# Patient Record
Sex: Female | Born: 1940
Health system: Southern US, Community
[De-identification: ages and names within clinical notes are randomized; demographics above are authoritative.]

## PROBLEM LIST (undated history)

## (undated) DIAGNOSIS — M1990 Unspecified osteoarthritis, unspecified site: Secondary | ICD-10-CM

## (undated) DIAGNOSIS — B019 Varicella without complication: Secondary | ICD-10-CM

## (undated) DIAGNOSIS — I10 Essential (primary) hypertension: Secondary | ICD-10-CM

## (undated) DIAGNOSIS — K5900 Constipation, unspecified: Secondary | ICD-10-CM

## (undated) DIAGNOSIS — J439 Emphysema, unspecified: Secondary | ICD-10-CM

## (undated) DIAGNOSIS — E538 Deficiency of other specified B group vitamins: Secondary | ICD-10-CM

## (undated) DIAGNOSIS — R7303 Prediabetes: Secondary | ICD-10-CM

## (undated) DIAGNOSIS — G4730 Sleep apnea, unspecified: Secondary | ICD-10-CM

## (undated) DIAGNOSIS — N390 Urinary tract infection, site not specified: Secondary | ICD-10-CM

## (undated) DIAGNOSIS — R600 Localized edema: Secondary | ICD-10-CM

## (undated) DIAGNOSIS — R42 Dizziness and giddiness: Secondary | ICD-10-CM

## (undated) DIAGNOSIS — I890 Lymphedema, not elsewhere classified: Secondary | ICD-10-CM

## (undated) DIAGNOSIS — C801 Malignant (primary) neoplasm, unspecified: Secondary | ICD-10-CM

## (undated) DIAGNOSIS — R079 Chest pain, unspecified: Secondary | ICD-10-CM

## (undated) DIAGNOSIS — M2550 Pain in unspecified joint: Secondary | ICD-10-CM

## (undated) DIAGNOSIS — N189 Chronic kidney disease, unspecified: Secondary | ICD-10-CM

## (undated) DIAGNOSIS — M549 Dorsalgia, unspecified: Secondary | ICD-10-CM

## (undated) DIAGNOSIS — C55 Malignant neoplasm of uterus, part unspecified: Secondary | ICD-10-CM

## (undated) DIAGNOSIS — C50919 Malignant neoplasm of unspecified site of unspecified female breast: Secondary | ICD-10-CM

## (undated) DIAGNOSIS — H409 Unspecified glaucoma: Secondary | ICD-10-CM

## (undated) DIAGNOSIS — R0602 Shortness of breath: Secondary | ICD-10-CM

## (undated) DIAGNOSIS — F419 Anxiety disorder, unspecified: Secondary | ICD-10-CM

## (undated) DIAGNOSIS — D649 Anemia, unspecified: Secondary | ICD-10-CM

## (undated) DIAGNOSIS — R32 Unspecified urinary incontinence: Secondary | ICD-10-CM

## (undated) HISTORY — DX: Anemia, unspecified: D64.9

## (undated) HISTORY — DX: Anxiety disorder, unspecified: F41.9

## (undated) HISTORY — DX: Unspecified osteoarthritis, unspecified site: M19.90

## (undated) HISTORY — DX: Unspecified urinary incontinence: R32

## (undated) HISTORY — DX: Malignant (primary) neoplasm, unspecified: C80.1

## (undated) HISTORY — DX: Urinary tract infection, site not specified: N39.0

## (undated) HISTORY — DX: Constipation, unspecified: K59.00

## (undated) HISTORY — DX: Pain in unspecified joint: M25.50

## (undated) HISTORY — DX: Deficiency of other specified B group vitamins: E53.8

## (undated) HISTORY — DX: Localized edema: R60.0

## (undated) HISTORY — DX: Varicella without complication: B01.9

## (undated) HISTORY — DX: Lymphedema, not elsewhere classified: I89.0

## (undated) HISTORY — DX: Prediabetes: R73.03

## (undated) HISTORY — DX: Unspecified glaucoma: H40.9

## (undated) HISTORY — DX: Essential (primary) hypertension: I10

## (undated) HISTORY — DX: Dorsalgia, unspecified: M54.9

## (undated) HISTORY — DX: Malignant neoplasm of uterus, part unspecified: C55

## (undated) HISTORY — DX: Shortness of breath: R06.02

## (undated) HISTORY — DX: Emphysema, unspecified: J43.9

## (undated) HISTORY — DX: Chronic kidney disease, unspecified: N18.9

## (undated) HISTORY — DX: Chest pain, unspecified: R07.9

## (undated) HISTORY — DX: Dizziness and giddiness: R42

## (undated) HISTORY — DX: Malignant neoplasm of unspecified site of unspecified female breast: C50.919

---

## 2009-08-12 HISTORY — PX: ABDOMINAL HYSTERECTOMY: SHX81

## 2010-08-12 HISTORY — PX: REPLACEMENT TOTAL KNEE: SUR1224

## 2017-12-04 LAB — CBC AND DIFFERENTIAL
HCT: 39 — AB (ref 41–53)
Hemoglobin: 12.3 — AB (ref 13.5–17.5)
Neutrophils Absolute: 6
Platelets: 333 (ref 150–399)
WBC: 8.6

## 2017-12-04 LAB — BASIC METABOLIC PANEL
BUN: 19 (ref 4–21)
CREATININE: 0.6 (ref 0.6–1.3)
Glucose: 94
Potassium: 5.2 (ref 3.4–5.3)
Sodium: 140 (ref 137–147)

## 2017-12-04 LAB — HEPATIC FUNCTION PANEL
ALT: 32 (ref 10–40)
AST: 31 (ref 14–40)
Alkaline Phosphatase: 95 (ref 25–125)
Bilirubin, Total: 0.4

## 2017-12-09 LAB — HM MAMMOGRAPHY

## 2018-08-14 ENCOUNTER — Ambulatory Visit (INDEPENDENT_AMBULATORY_CARE_PROVIDER_SITE_OTHER): Payer: Medicare HMO | Admitting: Nurse Practitioner

## 2018-08-14 ENCOUNTER — Encounter: Payer: Self-pay | Admitting: Nurse Practitioner

## 2018-08-14 VITALS — BP 138/90 | HR 85 | Temp 98.3°F | Ht 63.5 in | Wt 264.8 lb

## 2018-08-14 DIAGNOSIS — R238 Other skin changes: Secondary | ICD-10-CM | POA: Diagnosis not present

## 2018-08-14 DIAGNOSIS — I10 Essential (primary) hypertension: Secondary | ICD-10-CM | POA: Diagnosis not present

## 2018-08-14 MED ORDER — HYDROCHLOROTHIAZIDE 25 MG PO TABS: 25.0000 mg | ORAL_TABLET | Freq: Every day | ORAL | 0 refills | Status: DC

## 2018-08-14 MED ORDER — KETOCONAZOLE 2 % EX SHAM: 1.0000 "application " | MEDICATED_SHAMPOO | CUTANEOUS | 1 refills | Status: DC

## 2018-08-14 NOTE — Patient Instructions (Addendum)
List of podiatry: [facility] in [facility] or [facility] in [place]  List of ophthalmology: [facility] in [facility] or [facility]  List of orthopedic: [facility] in [place].  Sign medical release to get records from previous pcp. Will provide additional HCTZ refill after review of lab results from previous pcp  Let me know name of nebulizer medication.

## 2018-08-14 NOTE — Progress Notes (Signed)
Subjective:  Patient ID: Kristi Hunter, female    DOB: Sep 05, 1940  Age: 78 y.o. MRN: [id number]  CC: Establish Care (Est care/ cough clear, going on for little over a month, lost voice / using nebulizer/medication consult: for hair/ FYI pt will report immunizations later)   HPI Retired Engineer, maintenance (IT), has 3children and 2grandchildren  moved from [facility] to [name]. Previous pcp in [facility], last seen Dr.Moraes' office 06/2018. Last labs done 06/2018. Hx of HTN: use of HCTZ, needs refill Hx of Dry Scalp: needs ketoconazole shampoo refill Hx of OA in multiple joints, intermittent sciatica pain with right side radiculopathy.  Hx of chronic bronchitis (use of nebulizer prn, unable to remember name) Hx of nephritis as child, no hx of CKD. Hx of chronic LE edema: use of electric compression device as needed.  Recurrent cough, diagnosed with chronic bronchitis, previous tobacco use from age 16 to 38.  Hx of Urinary incontinence (stress and Urge): Worse at night.  Hx of Uterine cancer 2011. No radiation of chemotherapy needed per patient. Followed by oncology x 15yrs only, s/p total hysterectomy and oophrectomy.  Reviewed past Medical, Social and Family history today.  Outpatient Medications Prior to Visit  Medication Sig Dispense Refill  . aspirin 81 MG chewable tablet Chew by mouth daily.    . Calcium Carb-Cholecalciferol (CALCIUM 600 + D PO) Take by mouth.    . Cholecalciferol (VITAMIN D3 PO) Take 50 mcg by mouth.    Marland Kitchen GLUCOSAMINE-CHONDROITIN-VIT D3 PO Take 2,000 Units by mouth.    . Melatonin 10 MG TABS Take by mouth.    . Multiple Vitamin (MULTIVITAMIN) tablet Take 1 tablet by mouth daily.    . naproxen sodium (ALEVE) 220 MG tablet Take 220 mg by mouth.     No facility-administered medications prior to visit.     ROS See HPI  Objective:  BP 138/90   Pulse 85   Temp 98.3 F (36.8 C) (Oral)   Ht 5' 3.5" (1.613 m)   Wt 264 lb 12.8 oz (120.1 kg)   SpO2 95%   BMI 46.17 kg/m   BP Readings from  Last 3 Encounters:  08/14/18 138/90    Wt Readings from Last 3 Encounters:  08/14/18 264 lb 12.8 oz (120.1 kg)    Physical Exam Vitals signs reviewed.  Constitutional:      Appearance: He is obese.  Cardiovascular:     Rate and Rhythm: Normal rate and regular rhythm.     Pulses: Normal pulses.     Heart sounds: Normal heart sounds.  Pulmonary:     Effort: Pulmonary effort is normal.     Breath sounds: Normal breath sounds.  Musculoskeletal:     Right lower leg: No edema.     Left lower leg: No edema.  Skin:    Comments: Dry scaly patches on occipital region of scalp  Neurological:     Mental Status: He is alert.    No results found for: WBC, HGB, HCT, PLT, GLUCOSE, CHOL, TRIG, HDL, LDLDIRECT, LDLCALC, ALT, AST, NA, K, CL, CREATININE, BUN, CO2, TSH, PSA, INR, GLUF, HGBA1C, MICROALBUR  Assessment & Plan:   Sidonie was seen today for establish care.  Diagnoses and all orders for this visit:  Dry scalp -     ketoconazole (NIZORAL) 2 % shampoo; Apply 1 application topically 2 (two) times a week.  Essential hypertension -     hydrochlorothiazide (HYDRODIURIL) 25 MG tablet; Take 1 tablet (25 mg total) by mouth daily.   I  am having Eulis Manly start on ketoconazole and hydrochlorothiazide. I am also having him maintain his Cholecalciferol (VITAMIN D3 PO), GLUCOSAMINE-CHONDROITIN-VIT D3 PO, Calcium Carb-Cholecalciferol (CALCIUM 600 + D PO), aspirin, Melatonin, naproxen sodium, and multivitamin.  Meds ordered this encounter  Medications  . ketoconazole (NIZORAL) 2 % shampoo    Sig: Apply 1 application topically 2 (two) times a week.    Dispense:  120 mL    Refill:  1    Order Specific Question:   Supervising Provider    Answer:   Lucille Passy [[id number]]  . hydrochlorothiazide (HYDRODIURIL) 25 MG tablet    Sig: Take 1 tablet (25 mg total) by mouth daily.    Dispense:  30 tablet    Refill:  0    Order Specific Question:   Supervising Provider    Answer:   Lucille Passy [[id number]]    Problem List Items Addressed This Visit    None    Visit Diagnoses    Dry scalp    -  Primary   Relevant Medications   ketoconazole (NIZORAL) 2 % shampoo (Start on 08/17/2018)   Essential hypertension       Relevant Medications   hydrochlorothiazide (HYDRODIURIL) 25 MG tablet   aspirin 81 MG chewable tablet       Follow-up: Return if symptoms worsen or fail to improve.  Wilfred Lacy, NP

## 2018-08-21 ENCOUNTER — Telehealth: Payer: Self-pay

## 2018-08-21 NOTE — Telephone Encounter (Signed)
Spoke with the pt, advise her that we still waiting on records from her last PCP in order to complete this from for long term. Pt request to wait until her records comes to prevent her from going/paying multiple times to [facility].   We will call the pt once we get the records/form complete (I will check on this next week if we don't have record.   FYI--pt stated this handicap placard is mostly for her arthritis,unable to walk more than 200 feet without having SOB, hip and knee painful during rainy and winter season (gets really bad during this time a year). Pt doesn't like to take pain med while driving that's why handicap placard very helpful to the pt.

## 2018-08-21 NOTE — Telephone Encounter (Signed)
Copied from [place] [id number]. Topic: General - Inquiry >> Aug 21, 2018 10:59 AM Sheran Luz wrote: Reason for CRM: Patient called inquiring as to why her parking placard that she picked up from office yesterday was only for 6 months. Patient states that in [place] she received a placard that was good for 5 years. Patient would like a call back as soon as possible to discuss. Please advise.

## 2018-08-25 NOTE — Telephone Encounter (Signed)
Do not have record yet, refaxed record releaser form again. Waiting for records.

## 2018-08-27 ENCOUNTER — Encounter: Payer: Self-pay | Admitting: Nurse Practitioner

## 2018-08-27 DIAGNOSIS — I10 Essential (primary) hypertension: Secondary | ICD-10-CM | POA: Insufficient documentation

## 2018-08-27 DIAGNOSIS — M5431 Sciatica, right side: Secondary | ICD-10-CM | POA: Insufficient documentation

## 2018-08-27 DIAGNOSIS — R739 Hyperglycemia, unspecified: Secondary | ICD-10-CM | POA: Insufficient documentation

## 2018-08-27 DIAGNOSIS — E782 Mixed hyperlipidemia: Secondary | ICD-10-CM | POA: Insufficient documentation

## 2018-08-27 DIAGNOSIS — M1990 Unspecified osteoarthritis, unspecified site: Secondary | ICD-10-CM

## 2018-08-27 DIAGNOSIS — M1991 Primary osteoarthritis, unspecified site: Secondary | ICD-10-CM | POA: Insufficient documentation

## 2018-08-27 DIAGNOSIS — E559 Vitamin D deficiency, unspecified: Secondary | ICD-10-CM | POA: Insufficient documentation

## 2018-08-27 DIAGNOSIS — F419 Anxiety disorder, unspecified: Secondary | ICD-10-CM | POA: Insufficient documentation

## 2018-08-27 DIAGNOSIS — R609 Edema, unspecified: Secondary | ICD-10-CM | POA: Insufficient documentation

## 2018-08-27 DIAGNOSIS — F325 Major depressive disorder, single episode, in full remission: Secondary | ICD-10-CM | POA: Insufficient documentation

## 2018-08-27 LAB — BRAIN NATRIURETIC PEPTIDE: B-Type Natriuretic Peptide: 7.3

## 2018-08-27 NOTE — Progress Notes (Signed)
Abstracted result and sent to scan  

## 2018-08-27 NOTE — Telephone Encounter (Signed)
Form completed, send to scan, pt aware to pick up form at the office.

## 2018-08-27 NOTE — Telephone Encounter (Signed)
Received record from Dr. Lindalou Hose. Placed at [employer] to review.

## 2018-08-30 DIAGNOSIS — R109 Unspecified abdominal pain: Secondary | ICD-10-CM | POA: Diagnosis not present

## 2018-08-30 DIAGNOSIS — A09 Infectious gastroenteritis and colitis, unspecified: Secondary | ICD-10-CM | POA: Diagnosis not present

## 2018-08-31 ENCOUNTER — Telehealth: Payer: Self-pay | Admitting: Nurse Practitioner

## 2018-08-31 NOTE — Telephone Encounter (Signed)
Following up on the TCM call over the weekend. Pt call in complaining of diarrhea,abd cramping x 5 days, runny brown stools. Pt has been eating yogurt and BRAT diet,denie fever or taking any abx right now.   Spoke with the pt, she went to urgent care 08/30/2018 and got abx,abd cramping med and probiotic to treat her symptoms, she stated she feeling a little better today. Pt will finish abx and see how she feels.   Advise the pt to let us know or go to urgent care/ ED if she feels worse or she is not better. Pt verbalized understand.

## 2018-09-18 ENCOUNTER — Telehealth: Payer: Self-pay

## 2018-09-18 DIAGNOSIS — R69 Illness, unspecified: Secondary | ICD-10-CM | POA: Diagnosis not present

## 2018-09-18 NOTE — Telephone Encounter (Signed)
Copied from [place] [id number]. 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 Please advise. Patient is scheduled to have dental surgery on Monday 09/21/2018 and they need this form asap.

## 2018-09-21 DIAGNOSIS — R69 Illness, unspecified: Secondary | ICD-10-CM | POA: Diagnosis not present

## 2018-09-21 NOTE — Telephone Encounter (Signed)
Form completed and faxed. 

## 2018-10-05 ENCOUNTER — Telehealth: Payer: Self-pay | Admitting: Nurse Practitioner

## 2018-10-05 NOTE — Telephone Encounter (Signed)
Copied from [place] [phone]. Topic: Quick Communication - See Telephone Encounter >> Oct 05, 2018  9:28 AM Robina Ade, Helene Kelp D wrote: CRM for notification. See Telephone encounter for: 10/05/18. Patient call and said that she would like to talk to someone regarding 2 bills that she got of $25. She said that she saw  Wilfred Lacy and her insurance has her at a specialty and not a primary physician. Patient has already spokes with her insurance and [facility] billing. Please call patient back, thanks.

## 2018-10-14 NOTE — Telephone Encounter (Signed)
Megan from [facility] called and stated Pt is receiving bills due to NP [name] being the name on the submission/ [employer] asked if visits can be resubmitted and for now on with an MD's name or the office name since under their insurance an NP is a specialist / please reach out to Pt and advise

## 2018-10-15 NOTE — Telephone Encounter (Signed)
[name] please help.

## 2018-10-20 NOTE — Telephone Encounter (Signed)
I have reached out to someone in credentialing and checking on this. I also spoke with [name] and she never had to submit claims that way. I will update patient once I find out more.

## 2018-10-27 NOTE — Telephone Encounter (Signed)
[employer] calling back to see if the patient has been contacted? Wants to know if the billing will be changed as billing told them to contact the practice.

## 2018-11-12 ENCOUNTER — Telehealth: Payer: Self-pay

## 2018-11-12 DIAGNOSIS — Z1211 Encounter for screening for malignant neoplasm of colon: Secondary | ICD-10-CM

## 2018-11-12 NOTE — Telephone Encounter (Signed)
Charlotte please advise, okey to refer pt to GI for colonoscopy?

## 2018-11-12 NOTE — Telephone Encounter (Signed)
If no FHx of colon cancer of colon polyp, ok to order cologuard.

## 2018-11-12 NOTE — Telephone Encounter (Signed)
Left vm for the pt to call back.  

## 2018-11-12 NOTE — Telephone Encounter (Signed)
ok 

## 2018-11-12 NOTE — Telephone Encounter (Signed)
Copied from [place] [phone]. Topic: General - Other >> Nov 11, 2018  3:50 PM Percell Belt A wrote: Reason for CRM:  pt called in and left message on general mailbox-  she is stating that per her ins, they are telling her that Kristi Hunter is a specialist, she stated she has been dealing with this with her ins since Jan.  She would like to have a call back.  She is getting a bill for 25.00 because it is saying that Either Kristi Hunter, or Dr Kristi Hunter is billing a specialist.   She would also like to know if she can do the Cologuard ?  Instead of a Colonoscopy ?     Please advise  -[phone]

## 2018-11-12 NOTE — Telephone Encounter (Signed)
Kristi Hunter please advise about the cologuard vs colonoscopy?   [name] please help with the billing part.

## 2018-11-12 NOTE — Telephone Encounter (Signed)
Pt called back . FAhx fo colon CA X2 grandfather, grandmother polyps. She didn't remember with side had which one, Please RCB to Pt .

## 2018-11-13 NOTE — Addendum Note (Signed)
Addended byShawnie Pons on: 11/13/2018 09:23 AM   Modules accepted: Orders

## 2018-11-13 NOTE — Telephone Encounter (Signed)
Referral enter/ pt is aware.

## 2018-12-18 DIAGNOSIS — M21622 Bunionette of left foot: Secondary | ICD-10-CM | POA: Diagnosis not present

## 2018-12-18 DIAGNOSIS — M79672 Pain in left foot: Secondary | ICD-10-CM | POA: Diagnosis not present

## 2018-12-18 DIAGNOSIS — L600 Ingrowing nail: Secondary | ICD-10-CM | POA: Diagnosis not present

## 2019-01-07 ENCOUNTER — Other Ambulatory Visit: Payer: Self-pay | Admitting: Nurse Practitioner

## 2019-01-07 DIAGNOSIS — I10 Essential (primary) hypertension: Secondary | ICD-10-CM

## 2019-01-07 NOTE — Telephone Encounter (Signed)
Needs video appt for re eval of HTN prior to medication refill

## 2019-01-07 NOTE — Telephone Encounter (Signed)
Monica please help call the pt and offer an appt 

## 2019-01-07 NOTE — Telephone Encounter (Signed)
I called and spoke to patient. Patient informed that prior to med refill per Wilfred Lacy, patient needs a virtual visit. Patient stated that she was busy right now and will call back later.

## 2019-01-11 ENCOUNTER — Telehealth: Payer: Self-pay

## 2019-01-11 ENCOUNTER — Encounter: Payer: Self-pay | Admitting: Nurse Practitioner

## 2019-01-11 ENCOUNTER — Ambulatory Visit (INDEPENDENT_AMBULATORY_CARE_PROVIDER_SITE_OTHER): Payer: Medicare HMO | Admitting: Nurse Practitioner

## 2019-01-11 VITALS — BP 140/82 | HR 115 | Temp 98.1°F | Ht 63.5 in | Wt 168.6 lb

## 2019-01-11 DIAGNOSIS — L719 Rosacea, unspecified: Secondary | ICD-10-CM | POA: Diagnosis not present

## 2019-01-11 DIAGNOSIS — I10 Essential (primary) hypertension: Secondary | ICD-10-CM

## 2019-01-11 DIAGNOSIS — Z800 Family history of malignant neoplasm of digestive organs: Secondary | ICD-10-CM

## 2019-01-11 DIAGNOSIS — E782 Mixed hyperlipidemia: Secondary | ICD-10-CM

## 2019-01-11 DIAGNOSIS — L821 Other seborrheic keratosis: Secondary | ICD-10-CM

## 2019-01-11 DIAGNOSIS — F325 Major depressive disorder, single episode, in full remission: Secondary | ICD-10-CM | POA: Diagnosis not present

## 2019-01-11 DIAGNOSIS — R69 Illness, unspecified: Secondary | ICD-10-CM | POA: Diagnosis not present

## 2019-01-11 DIAGNOSIS — R739 Hyperglycemia, unspecified: Secondary | ICD-10-CM

## 2019-01-11 DIAGNOSIS — Z780 Asymptomatic menopausal state: Secondary | ICD-10-CM

## 2019-01-11 DIAGNOSIS — F419 Anxiety disorder, unspecified: Secondary | ICD-10-CM | POA: Diagnosis not present

## 2019-01-11 DIAGNOSIS — Z1211 Encounter for screening for malignant neoplasm of colon: Secondary | ICD-10-CM | POA: Diagnosis not present

## 2019-01-11 MED ORDER — METRONIDAZOLE 0.75 % EX GEL: 1.0000 "application " | Freq: Two times a day (BID) | CUTANEOUS | 1 refills | Status: DC

## 2019-01-11 MED ORDER — KETOCONAZOLE 2 % EX SHAM: 1.0000 "application " | MEDICATED_SHAMPOO | CUTANEOUS | 2 refills | Status: DC

## 2019-01-11 NOTE — Telephone Encounter (Signed)
I LMOVM for pt to call back to go through check-in process prior to visit with Charlotte/thx dmf

## 2019-01-11 NOTE — Patient Instructions (Signed)
Go to lab for blood draw. Will send HCTZ refill after review of lab results. Let me know if you change your mind about urology referral. You will be contacted to schedule appt for bone density and with GI.

## 2019-01-11 NOTE — Progress Notes (Addendum)
Virtual Visit via Video Note  I connected with Kristi Hunter on 01/13/19 at 11:00 AM EDT by a video enabled telemedicine application and verified that I am speaking with the correct person using two identifiers.  Location: Patient:Home Provider: Office   I discussed the limitations of evaluation and management by telemedicine and the availability of in person appointments. The patient expressed understanding and agreed to proceed.  History of Present Illness: HTN: No vital signs to share. Use of HCTZ at this time.  Anxiety and depression: chronic Denies any depression. Endorses intermittent anxiety: symptoms are palpitation. Symptoms improve with breathing exercise. States she is not interested in use of medication or counseling at this time.  Nocturia: Chronic, disrupts sleep, use of ditropan in past with no improvement. Evaluated by urology while in [place]. She is not interested in another evaluation by urology nor use of medication at this time.   Rash: Chronic, waxing and waning Dry scalp which improves with ketoconazole shampoo Rash around nose and under chin.  Observations/Objective: Physical Exam  Constitutional: She is oriented to person, place, and time. No distress.  Pulmonary/Chest: Effort normal.  Neurological: She is alert and oriented to person, place, and time.  Skin: Rash noted. Rash is macular. There is erythema.   Assessment and Plan: Kristi Hunter was seen today for medication refill.  Diagnoses and all orders for this visit:  HTN (hypertension), benign -     Comprehensive metabolic panel; Future -     TSH; Future -     CBC; Future  Depression, major, single episode, complete remission ([place])  Mixed hyperlipidemia -     Comprehensive metabolic panel; Future -     Lipid panel; Future  Anxiety  Hyperglycemia -     Hemoglobin A1c; Future -     Comprehensive metabolic panel; Future  Seborrheic keratosis -     ketoconazole (NIZORAL) 2 %  shampoo; Apply 1 application topically 2 (two) times a week.  Rosacea -     metroNIDAZOLE (METROGEL) 0.75 % gel; Apply 1 application topically 2 (two) times daily.  Asymptomatic age-related postmenopausal state -     DG Bone Density; Future  Colon cancer screening -     Ambulatory referral to Gastroenterology  Family history of colon cancer -     Ambulatory referral to Gastroenterology  Essential hypertension -     hydrochlorothiazide (HYDRODIURIL) 25 MG tablet; Take 1 tablet (25 mg total) by mouth daily.   Follow Up Instructions: F/up in 71months Let me know if you change your mind about urology referral. You will be contacted to schedule appt for bone density and with GI.   I discussed the assessment and treatment plan with the patient. The patient was provided an opportunity to ask questions and all were answered. The patient agreed with the plan and demonstrated an understanding of the instructions.   The patient was advised to call back or seek an in-person evaluation if the symptoms worsen or if the condition fails to improve as anticipated.   Wilfred Lacy, NP

## 2019-01-12 ENCOUNTER — Telehealth: Payer: Self-pay

## 2019-01-12 ENCOUNTER — Encounter: Payer: Self-pay | Admitting: Nurse Practitioner

## 2019-01-12 ENCOUNTER — Other Ambulatory Visit: Payer: Self-pay | Admitting: Nurse Practitioner

## 2019-01-12 DIAGNOSIS — I10 Essential (primary) hypertension: Secondary | ICD-10-CM

## 2019-01-12 NOTE — Telephone Encounter (Signed)
PN-Plz see note below/thx dmf

## 2019-01-12 NOTE — Telephone Encounter (Signed)
Wait for lab results

## 2019-01-12 NOTE — Telephone Encounter (Signed)
I called  Pt to prescreen before tomorrow's lab visit. She immediately started yelling that her pharmacy wouldn't fill her prescriptions, they said she needed new prescriptions. I told her I was not a nurse but I would relay the message and asked for the names of the medications. She started yelling again that she did not know and that she had someone else on hold and hung up the phone.

## 2019-01-12 NOTE — Telephone Encounter (Signed)
Please advise. Pt schedule to have lab work done 01/13/2019.

## 2019-01-12 NOTE — Telephone Encounter (Signed)
Pt is aware that we have to wait for the lab.

## 2019-01-13 ENCOUNTER — Encounter: Payer: Self-pay | Admitting: Nurse Practitioner

## 2019-01-13 ENCOUNTER — Other Ambulatory Visit (INDEPENDENT_AMBULATORY_CARE_PROVIDER_SITE_OTHER): Payer: Medicare HMO

## 2019-01-13 DIAGNOSIS — I10 Essential (primary) hypertension: Secondary | ICD-10-CM | POA: Diagnosis not present

## 2019-01-13 DIAGNOSIS — E782 Mixed hyperlipidemia: Secondary | ICD-10-CM | POA: Diagnosis not present

## 2019-01-13 DIAGNOSIS — R739 Hyperglycemia, unspecified: Secondary | ICD-10-CM | POA: Diagnosis not present

## 2019-01-13 LAB — CBC
HCT: 40.4 % (ref 36.0–46.0)
Hemoglobin: 13.4 g/dL (ref 12.0–15.0)
MCHC: 33.3 g/dL (ref 30.0–36.0)
MCV: 83.5 fl (ref 78.0–100.0)
Platelets: 317 10*3/uL (ref 150.0–400.0)
RBC: 4.84 Mil/uL (ref 3.87–5.11)
RDW: 14.2 % (ref 11.5–15.5)
WBC: 7.7 10*3/uL (ref 4.0–10.5)

## 2019-01-13 LAB — COMPREHENSIVE METABOLIC PANEL
ALT: 19 U/L (ref 0–35)
AST: 19 U/L (ref 0–37)
Albumin: 3.6 g/dL (ref 3.5–5.2)
Alkaline Phosphatase: 73 U/L (ref 39–117)
BUN: 16 mg/dL (ref 6–23)
CO2: 31 mEq/L (ref 19–32)
Calcium: 9.6 mg/dL (ref 8.4–10.5)
Chloride: 101 mEq/L (ref 96–112)
Creatinine, Ser: 0.67 mg/dL (ref 0.40–1.20)
GFR: 85.7 mL/min (ref >60.00)
Glucose, Bld: 101 mg/dL — ABNORMAL HIGH (ref 70–99)
Potassium: 4.2 mEq/L (ref 3.5–5.1)
Sodium: 139 mEq/L (ref 135–145)
Total Bilirubin: 0.4 mg/dL (ref 0.2–1.2)
Total Protein: 7.2 g/dL (ref 6.0–8.3)

## 2019-01-13 LAB — TSH: TSH: 1.71 u[IU]/mL (ref 0.35–4.50)

## 2019-01-13 LAB — LIPID PANEL
Cholesterol: 151 mg/dL (ref 0–200)
HDL: 44.9 mg/dL (ref >39.00)
LDL Cholesterol: 92 mg/dL (ref 0–99)
NonHDL: 106.43
Total CHOL/HDL Ratio: 3
Triglycerides: 72 mg/dL (ref 0.0–149.0)
VLDL: 14.4 mg/dL (ref 0.0–40.0)

## 2019-01-13 LAB — HEMOGLOBIN A1C: Hgb A1c MFr Bld: 6 % (ref 4.6–6.5)

## 2019-01-13 MED ORDER — HYDROCHLOROTHIAZIDE 25 MG PO TABS: 25.0000 mg | ORAL_TABLET | Freq: Every day | ORAL | 1 refills | Status: DC

## 2019-01-13 NOTE — Telephone Encounter (Signed)
Already adressed

## 2019-01-13 NOTE — Addendum Note (Signed)
Addended by: Wilfred Lacy L on: 01/13/2019 01:59 PM   Modules accepted: Orders

## 2019-01-14 DIAGNOSIS — L600 Ingrowing nail: Secondary | ICD-10-CM | POA: Diagnosis not present

## 2019-01-14 DIAGNOSIS — M79674 Pain in right toe(s): Secondary | ICD-10-CM | POA: Diagnosis not present

## 2019-01-14 DIAGNOSIS — B351 Tinea unguium: Secondary | ICD-10-CM | POA: Diagnosis not present

## 2019-01-14 DIAGNOSIS — M79675 Pain in left toe(s): Secondary | ICD-10-CM | POA: Diagnosis not present

## 2019-01-19 ENCOUNTER — Telehealth: Payer: Self-pay

## 2019-01-19 ENCOUNTER — Encounter: Payer: Self-pay | Admitting: Nurse Practitioner

## 2019-01-19 DIAGNOSIS — B351 Tinea unguium: Secondary | ICD-10-CM | POA: Diagnosis not present

## 2019-01-19 DIAGNOSIS — Z1211 Encounter for screening for malignant neoplasm of colon: Secondary | ICD-10-CM

## 2019-01-19 DIAGNOSIS — M1990 Unspecified osteoarthritis, unspecified site: Secondary | ICD-10-CM | POA: Diagnosis not present

## 2019-01-19 DIAGNOSIS — Z791 Long term (current) use of non-steroidal anti-inflammatories (NSAID): Secondary | ICD-10-CM | POA: Diagnosis not present

## 2019-01-19 DIAGNOSIS — I10 Essential (primary) hypertension: Secondary | ICD-10-CM | POA: Diagnosis not present

## 2019-01-19 DIAGNOSIS — L719 Rosacea, unspecified: Secondary | ICD-10-CM | POA: Diagnosis not present

## 2019-01-19 DIAGNOSIS — Z7982 Long term (current) use of aspirin: Secondary | ICD-10-CM | POA: Diagnosis not present

## 2019-01-19 DIAGNOSIS — L219 Seborrheic dermatitis, unspecified: Secondary | ICD-10-CM | POA: Diagnosis not present

## 2019-01-19 DIAGNOSIS — R32 Unspecified urinary incontinence: Secondary | ICD-10-CM | POA: Diagnosis not present

## 2019-01-19 DIAGNOSIS — Z1239 Encounter for other screening for malignant neoplasm of breast: Secondary | ICD-10-CM

## 2019-01-19 DIAGNOSIS — G8929 Other chronic pain: Secondary | ICD-10-CM | POA: Diagnosis not present

## 2019-01-19 NOTE — Telephone Encounter (Signed)
Pt was wondering if we can order MM (she is due this year) and if we can order colonguard--she is unable to keep the colon prep solution down (she tried twice) without vomit it back up. Pt grandfather (father side) dx with colon cancer as the age of 66. Please advise. FYI--I also release lab result on my chart for the pt to review.

## 2019-01-19 NOTE — Telephone Encounter (Signed)
This was discussed during her last OV. I advised her to discuss method of colon cancer screen with GI due to FHx of colon cancer. Ok to enter mammogram order.

## 2019-01-19 NOTE — Telephone Encounter (Signed)
Copied from [place] [id number]. Topic: General - Other >> Jan 19, 2019 10:25 AM Celene Kras A wrote: Reason for CRM: Pt called stating she was wanting to set up for a colonoscopy but was told she would have to get in contact with PCP in order to get cologuard pills. Please advise.

## 2019-01-20 NOTE — Addendum Note (Signed)
Addended byShawnie Pons on: 01/20/2019 11:20 AM   Modules accepted: Orders

## 2019-01-20 NOTE — Telephone Encounter (Signed)
Pt is aware that she has to see GI doctor to discuss other options for the colonoscopy prep since she unable to keep solutions down.   MM order entered.

## 2019-01-22 ENCOUNTER — Ambulatory Visit (INDEPENDENT_AMBULATORY_CARE_PROVIDER_SITE_OTHER)
Admission: RE | Admit: 2019-01-22 | Discharge: 2019-01-22 | Disposition: A | Discharge status: Home / Self Care | Payer: Medicare HMO | Source: Ambulatory Visit | Attending: Nurse Practitioner | Admitting: Nurse Practitioner

## 2019-01-22 ENCOUNTER — Other Ambulatory Visit: Payer: Self-pay

## 2019-01-22 DIAGNOSIS — Z780 Asymptomatic menopausal state: Secondary | ICD-10-CM

## 2019-02-01 NOTE — Telephone Encounter (Signed)
I spoke to this pt this morning about her colonoscopy- she states she cannot drink the prep and keep it down - She has a FH colon cancer in her grandfather, no 1st degree relative, so she could do a colo guard, if her PCP, C, [name] would order it. I explained to her that if it's a positive, she would have to have a colonoscopy - we dscussed all prep choices today, she wants the colo guard first.   Thanks, Lenard Galloway RN [facility] pre visit

## 2019-02-01 NOTE — Addendum Note (Signed)
Addended byShawnie Pons on: 02/01/2019 11:56 AM   Modules accepted: Orders

## 2019-02-01 NOTE — Telephone Encounter (Signed)
cologuard ordered. Pt is aware.

## 2019-02-01 NOTE — Telephone Encounter (Signed)
Charlotte please advise, ok to order colonguard?

## 2019-02-01 NOTE — Telephone Encounter (Signed)
Ok to order 

## 2019-02-09 DIAGNOSIS — Z1211 Encounter for screening for malignant neoplasm of colon: Secondary | ICD-10-CM | POA: Diagnosis not present

## 2019-02-09 DIAGNOSIS — Z1212 Encounter for screening for malignant neoplasm of rectum: Secondary | ICD-10-CM | POA: Diagnosis not present

## 2019-02-13 LAB — COLOGUARD: Cologuard: NEGATIVE

## 2019-02-15 DIAGNOSIS — L600 Ingrowing nail: Secondary | ICD-10-CM | POA: Diagnosis not present

## 2019-02-15 DIAGNOSIS — B351 Tinea unguium: Secondary | ICD-10-CM | POA: Diagnosis not present

## 2019-02-15 DIAGNOSIS — M79674 Pain in right toe(s): Secondary | ICD-10-CM | POA: Diagnosis not present

## 2019-02-15 DIAGNOSIS — M79675 Pain in left toe(s): Secondary | ICD-10-CM | POA: Diagnosis not present

## 2019-02-17 ENCOUNTER — Encounter: Payer: Self-pay | Admitting: Nurse Practitioner

## 2019-02-17 NOTE — Progress Notes (Signed)
Abstracted result and sent to scan  

## 2019-02-18 ENCOUNTER — Other Ambulatory Visit: Payer: Self-pay | Admitting: Nurse Practitioner

## 2019-02-18 DIAGNOSIS — Z1231 Encounter for screening mammogram for malignant neoplasm of breast: Secondary | ICD-10-CM

## 2019-02-24 ENCOUNTER — Encounter: Payer: Medicare HMO | Admitting: Gastroenterology

## 2019-03-22 DIAGNOSIS — M79672 Pain in left foot: Secondary | ICD-10-CM | POA: Diagnosis not present

## 2019-03-22 DIAGNOSIS — L600 Ingrowing nail: Secondary | ICD-10-CM | POA: Diagnosis not present

## 2019-03-22 DIAGNOSIS — B351 Tinea unguium: Secondary | ICD-10-CM | POA: Diagnosis not present

## 2019-03-22 DIAGNOSIS — M79675 Pain in left toe(s): Secondary | ICD-10-CM | POA: Diagnosis not present

## 2019-04-29 ENCOUNTER — Ambulatory Visit
Admission: RE | Admit: 2019-04-29 | Discharge: 2019-04-29 | Disposition: A | Discharge status: Home / Self Care | Payer: Medicare HMO | Source: Ambulatory Visit | Attending: Nurse Practitioner | Admitting: Nurse Practitioner

## 2019-04-29 ENCOUNTER — Other Ambulatory Visit: Payer: Self-pay

## 2019-04-29 DIAGNOSIS — Z1239 Encounter for other screening for malignant neoplasm of breast: Secondary | ICD-10-CM

## 2019-04-29 DIAGNOSIS — Z1231 Encounter for screening mammogram for malignant neoplasm of breast: Secondary | ICD-10-CM | POA: Diagnosis not present

## 2019-05-04 ENCOUNTER — Telehealth: Payer: Self-pay

## 2019-05-04 NOTE — Telephone Encounter (Signed)
Okay with me 

## 2019-05-04 NOTE — Telephone Encounter (Signed)
Both provider please advise.

## 2019-05-04 NOTE — Telephone Encounter (Signed)
Copied from [place] [phone]. Topic: Appointment Scheduling - Transfer of Care >> May 04, 2019 11:55 AM [name] wrote: Pt is requesting to transfer FROM: [name] Pt is requesting to transfer TO: [name] Reason for requested transfer: Pt's insurance keeps insisting [name] is a specialist and keeps charging pt for appts. Insurance stated she cannot be a PCP  Send CRM to patient's current PCP (transferring FROM).

## 2019-05-04 NOTE — Telephone Encounter (Signed)
ok 

## 2019-05-04 NOTE — Telephone Encounter (Signed)
Please help the pt set up TOC appt

## 2019-05-05 ENCOUNTER — Other Ambulatory Visit: Payer: Self-pay

## 2019-05-05 NOTE — Telephone Encounter (Signed)
Done

## 2019-05-06 ENCOUNTER — Ambulatory Visit: Payer: Medicare HMO | Admitting: Family Medicine

## 2019-05-07 ENCOUNTER — Telehealth: Payer: Self-pay

## 2019-05-07 NOTE — Telephone Encounter (Signed)

## 2019-05-10 ENCOUNTER — Ambulatory Visit (INDEPENDENT_AMBULATORY_CARE_PROVIDER_SITE_OTHER): Payer: Medicare HMO | Admitting: Family Medicine

## 2019-05-10 ENCOUNTER — Other Ambulatory Visit: Payer: Self-pay

## 2019-05-10 ENCOUNTER — Encounter: Payer: Self-pay | Admitting: Family Medicine

## 2019-05-10 VITALS — BP 124/70 | HR 92 | Ht 63.5 in | Wt 271.0 lb

## 2019-05-10 DIAGNOSIS — I10 Essential (primary) hypertension: Secondary | ICD-10-CM | POA: Insufficient documentation

## 2019-05-10 DIAGNOSIS — H409 Unspecified glaucoma: Secondary | ICD-10-CM | POA: Insufficient documentation

## 2019-05-10 DIAGNOSIS — M810 Age-related osteoporosis without current pathological fracture: Secondary | ICD-10-CM | POA: Diagnosis not present

## 2019-05-10 DIAGNOSIS — R238 Other skin changes: Secondary | ICD-10-CM

## 2019-05-10 DIAGNOSIS — M1991 Primary osteoarthritis, unspecified site: Secondary | ICD-10-CM

## 2019-05-10 DIAGNOSIS — R7303 Prediabetes: Secondary | ICD-10-CM | POA: Diagnosis not present

## 2019-05-10 LAB — BASIC METABOLIC PANEL
BUN: 21 mg/dL (ref 6–23)
CO2: 32 mEq/L (ref 19–32)
Calcium: 9.3 mg/dL (ref 8.4–10.5)
Chloride: 103 mEq/L (ref 96–112)
Creatinine, Ser: 0.58 mg/dL (ref 0.40–1.20)
GFR: 101.14 mL/min (ref >60.00)
Glucose, Bld: 90 mg/dL (ref 70–99)
Potassium: 3.8 mEq/L (ref 3.5–5.1)
Sodium: 141 mEq/L (ref 135–145)

## 2019-05-10 LAB — CBC
HCT: 38.6 % (ref 36.0–46.0)
Hemoglobin: 12.4 g/dL (ref 12.0–15.0)
MCHC: 32 g/dL (ref 30.0–36.0)
MCV: 83.6 fl (ref 78.0–100.0)
Platelets: 306 10*3/uL (ref 150.0–400.0)
RBC: 4.62 Mil/uL (ref 3.87–5.11)
RDW: 14.8 % (ref 11.5–15.5)
WBC: 6.9 10*3/uL (ref 4.0–10.5)

## 2019-05-10 LAB — URINALYSIS, ROUTINE W REFLEX MICROSCOPIC
Bilirubin Urine: NEGATIVE
Hgb urine dipstick: NEGATIVE
Ketones, ur: NEGATIVE
Leukocytes,Ua: NEGATIVE
Nitrite: NEGATIVE
RBC / HPF: NONE SEEN (ref 0–?)
Specific Gravity, Urine: 1.02 (ref 1.000–1.030)
Total Protein, Urine: NEGATIVE
Urine Glucose: NEGATIVE
Urobilinogen, UA: 0.2 (ref 0.0–1.0)
pH: 7 (ref 5.0–8.0)

## 2019-05-10 LAB — MICROALBUMIN / CREATININE URINE RATIO
Creatinine,U: 99.6 mg/dL
Microalb Creat Ratio: 1 mg/g (ref 0.0–30.0)
Microalb, Ur: 1 mg/dL (ref 0.0–1.9)

## 2019-05-10 LAB — HEMOGLOBIN A1C: Hgb A1c MFr Bld: 5.9 % (ref 4.6–6.5)

## 2019-05-10 MED ORDER — CHLORTHALIDONE 25 MG PO TABS: 25.0000 mg | ORAL_TABLET | Freq: Every day | ORAL | 0 refills | Status: DC

## 2019-05-10 NOTE — Patient Instructions (Signed)
Osteoarthritis  Osteoarthritis is a type of arthritis that affects tissue that covers the ends of bones in joints (cartilage). Cartilage acts as a cushion between the bones and helps them move smoothly. Osteoarthritis results when cartilage in the joints gets worn down. Osteoarthritis is sometimes called "wear and tear" arthritis. Osteoarthritis is the most common form of arthritis. It often occurs in older people. It is a condition that gets worse over time (a progressive condition). Joints that are most often affected by this condition are in:  Fingers.  Toes.  Hips.  Knees.  Spine, including neck and lower back. What are the causes? This condition is caused by age-related wearing down of cartilage that covers the ends of bones. What increases the risk? The following factors may make you more likely to develop this condition:  Older age.  Being overweight or obese.  Overuse of joints, such as in athletes.  Past injury of a joint.  Past surgery on a joint.  Family history of osteoarthritis. What are the signs or symptoms? The main symptoms of this condition are pain, swelling, and stiffness in the joint. The joint may lose its shape over time. Small pieces of bone or cartilage may break off and float inside of the joint, which may cause more pain and damage to the joint. Small deposits of bone (osteophytes) may grow on the edges of the joint. Other symptoms may include:  A grating or scraping feeling inside the joint when you move it.  Popping or creaking sounds when you move. Symptoms may affect one or more joints. Osteoarthritis in a major joint, such as your knee or hip, can make it painful to walk or exercise. If you have osteoarthritis in your hands, you might not be able to grip items, twist your hand, or control small movements of your hands and fingers (fine motor skills). How is this diagnosed? This condition may be diagnosed based on:  Your medical history.  A  physical exam.  Your symptoms.  X-rays of the affected joint(s).  Blood tests to rule out other types of arthritis. How is this treated? There is no cure for this condition, but treatment can help to control pain and improve joint function. Treatment plans may include:  A prescribed exercise program that allows for rest and joint relief. You may work with a physical therapist.  A weight control plan.  Pain relief techniques, such as: ? Applying heat and cold to the joint. ? Electric pulses delivered to nerve endings under the skin (transcutaneous electrical nerve stimulation, or TENS). ? Massage. ? Certain nutritional supplements.  NSAIDs or prescription medicines to help relieve pain.  Medicine to help relieve pain and inflammation (corticosteroids). This can be given by mouth (orally) or as an injection.  Assistive devices, such as a brace, wrap, splint, specialized glove, or cane.  Surgery, such as: ? An osteotomy. This is done to reposition the bones and relieve pain or to remove loose pieces of bone and cartilage. ? Joint replacement surgery. You may need this surgery if you have very bad (advanced) osteoarthritis. Follow these instructions at home: Activity  Rest your affected joints as directed by your health care provider.  Do not drive or use heavy machinery while taking prescription pain medicine.  Exercise as directed. Your health care provider or physical therapist may recommend specific types of exercise, such as: ? Strengthening exercises. These are done to strengthen the muscles that support joints that are affected by arthritis. They can be performed  with weights or with exercise bands to add resistance. ? Aerobic activities. These are exercises, such as brisk walking or water aerobics, that get your heart pumping. ? Range-of-motion activities. These keep your joints easy to move. ? Balance and agility exercises. Managing pain, stiffness, and swelling       If directed, apply heat to the affected area as often as told by your health care provider. Use the heat source that your health care provider recommends, such as a moist heat pack or a heating pad. ? If you have a removable assistive device, remove it as told by your health care provider. ? Place a towel between your skin and the heat source. If your health care provider tells you to keep the assistive device on while you apply heat, place a towel between the assistive device and the heat source. ? Leave the heat on for 20-30 minutes. ? Remove the heat if your skin turns bright red. This is especially important if you are unable to feel pain, heat, or cold. You may have a greater risk of getting burned.  If directed, put ice on the affected joint: ? If you have a removable assistive device, remove it as told by your health care provider. ? Put ice in a plastic bag. ? Place a towel between your skin and the bag. If your health care provider tells you to keep the assistive device on during icing, place a towel between the assistive device and the bag. ? Leave the ice on for 20 minutes, 2-3 times a day. General instructions  Take over-the-counter and prescription medicines only as told by your health care provider.  Maintain a healthy weight. Follow instructions from your health care provider for weight control. These may include dietary restrictions.  Do not use any products that contain nicotine or tobacco, such as cigarettes and e-cigarettes. These can delay bone healing. If you need help quitting, ask your health care provider.  Use assistive devices as directed by your health care provider.  Keep all follow-up visits as told by your health care provider. This is important. Where to find more information  [employer] of Arthritis and Musculoskeletal and Skin Diseases: www.niams.SouthEx[employer].es  [employer] on Aging: http://kim-miller.com/  American College of Rheumatology:  www.rheumatology.org Contact a health care provider if:  Your skin turns red.  You develop a rash.  You have pain that gets worse.  You have a fever along with joint or muscle aches. Get help right away if:  You lose a lot of weight.  You suddenly lose your appetite.  You have night sweats. Summary  Osteoarthritis is a type of arthritis that affects tissue covering the ends of bones in joints (cartilage).  This condition is caused by age-related wearing down of cartilage that covers the ends of bones.  The main symptom of this condition is pain, swelling, and stiffness in the joint.  There is no cure for this condition, but treatment can help to control pain and improve joint function. This information is not intended to replace advice given to you by your health care provider. Make sure you discuss any questions you have with your health care provider. Document Released: 07/29/2005 Document Revised: 07/11/2017 Document Reviewed: 04/01/2016 Elsevier Patient Education  2020 [place].  Diabetes Mellitus and Nutrition, Adult When you have diabetes (diabetes mellitus), it is very important to have healthy eating habits because your blood sugar (glucose) levels are greatly affected by what you eat and drink. Eating healthy foods  in the appropriate amounts, at about the same times every day, can help you:  Control your blood glucose.  Lower your risk of heart disease.  Improve your blood pressure.  Reach or maintain a healthy weight. Every person with diabetes is different, and each person has different needs for a meal plan. Your health care provider may recommend that you work with a diet and nutrition specialist (dietitian) to make a meal plan that is best for you. Your meal plan may vary depending on factors such as:  The calories you need.  The medicines you take.  Your weight.  Your blood glucose, blood pressure, and cholesterol levels.  Your activity level.   Other health conditions you have, such as heart or kidney disease. How do carbohydrates affect me? Carbohydrates, also called carbs, affect your blood glucose level more than any other type of food. Eating carbs naturally raises the amount of glucose in your blood. Carb counting is a method for keeping track of how many carbs you eat. Counting carbs is important to keep your blood glucose at a healthy level, especially if you use insulin or take certain oral diabetes medicines. It is important to know how many carbs you can safely have in each meal. This is different for every person. Your dietitian can help you calculate how many carbs you should have at each meal and for each snack. Foods that contain carbs include:  Bread, cereal, rice, pasta, and crackers.  Potatoes and corn.  Peas, beans, and lentils.  Milk and yogurt.  Fruit and juice.  Desserts, such as cakes, cookies, ice cream, and candy. How does alcohol affect me? Alcohol can cause a sudden decrease in blood glucose (hypoglycemia), especially if you use insulin or take certain oral diabetes medicines. Hypoglycemia can be a life-threatening condition. Symptoms of hypoglycemia (sleepiness, dizziness, and confusion) are similar to symptoms of having too much alcohol. If your health care provider says that alcohol is safe for you, follow these guidelines:  Limit alcohol intake to no more than 1 drink per day for nonpregnant women and 2 drinks per day for men. One drink equals 12 oz of beer, 5 oz of wine, or 1 oz of hard liquor.  Do not drink on an empty stomach.  Keep yourself hydrated with water, diet soda, or unsweetened iced tea.  Keep in mind that regular soda, juice, and other mixers may contain a lot of sugar and must be counted as carbs. What are tips for following this plan?  Reading food labels  Start by checking the serving size on the "Nutrition Facts" label of packaged foods and drinks. The amount of calories,  carbs, fats, and other nutrients listed on the label is based on one serving of the item. Many items contain more than one serving per package.  Check the total grams (g) of carbs in one serving. You can calculate the number of servings of carbs in one serving by dividing the total carbs by 15. For example, if a food has 30 g of total carbs, it would be equal to 2 servings of carbs.  Check the number of grams (g) of saturated and trans fats in one serving. Choose foods that have low or no amount of these fats.  Check the number of milligrams (mg) of salt (sodium) in one serving. Most people should limit total sodium intake to less than 2,300 mg per day.  Always check the nutrition information of foods labeled as "low-fat" or "nonfat". These foods  may be higher in added sugar or refined carbs and should be avoided.  Talk to your dietitian to identify your daily goals for nutrients listed on the label. Shopping  Avoid buying canned, premade, or processed foods. These foods tend to be high in fat, sodium, and added sugar.  Shop around the outside edge of the grocery store. This includes fresh fruits and vegetables, bulk grains, fresh meats, and fresh dairy. Cooking  Use low-heat cooking methods, such as baking, instead of high-heat cooking methods like deep frying.  Cook using healthy oils, such as olive, canola, or sunflower oil.  Avoid cooking with butter, cream, or high-fat meats. Meal planning  Eat meals and snacks regularly, preferably at the same times every day. Avoid going long periods of time without eating.  Eat foods high in fiber, such as fresh fruits, vegetables, beans, and whole grains. Talk to your dietitian about how many servings of carbs you can eat at each meal.  Eat 4-6 ounces (oz) of lean protein each day, such as lean meat, chicken, fish, eggs, or tofu. One oz of lean protein is equal to: ? 1 oz of meat, chicken, or fish. ? 1 egg. ?  cup of tofu.  Eat some foods  each day that contain healthy fats, such as avocado, nuts, seeds, and fish. Lifestyle  Check your blood glucose regularly.  Exercise regularly as told by your health care provider. This may include: ? 150 minutes of moderate-intensity or vigorous-intensity exercise each week. This could be brisk walking, biking, or water aerobics. ? Stretching and doing strength exercises, such as yoga or weightlifting, at least 2 times a week.  Take medicines as told by your health care provider.  Do not use any products that contain nicotine or tobacco, such as cigarettes and e-cigarettes. If you need help quitting, ask your health care provider.  Work with a Social worker or diabetes educator to identify strategies to manage stress and any emotional and social challenges. Questions to ask a health care provider  Do I need to meet with a diabetes educator?  Do I need to meet with a dietitian?  What number can I call if I have questions?  When are the best times to check my blood glucose? Where to find more information:  American Diabetes Association: diabetes.org  Academy of Nutrition and Dietetics: www.eatright.[employer] of Diabetes and Digestive and Kidney Diseases (NIH): [employer] Summary  A healthy meal plan will help you control your blood glucose and maintain a healthy lifestyle.  Working with a diet and nutrition specialist (dietitian) can help you make a meal plan that is best for you.  Keep in mind that carbohydrates (carbs) and alcohol have immediate effects on your blood glucose levels. It is important to count carbs and to use alcohol carefully. This information is not intended to replace advice given to you by your health care provider. Make sure you discuss any questions you have with your health care provider. Document Released: 04/25/2005 Document Revised: 07/11/2017 Document Reviewed: 09/02/2016 Elsevier Patient Education  2020 [employer].  Osteoporosis   Osteoporosis is thinning and loss of density in your bones. Osteoporosis makes bones more brittle and fragile and more likely to break (fracture). Over time, osteoporosis can cause your bones to become so weak that they fracture after a minor fall. Bones in the hip, wrist, and spine are most likely to fracture due to osteoporosis. What are the causes? The exact cause of this condition is not known. What  increases the risk? You may be at greater risk for osteoporosis if you:  Have a family history of the condition.  Have poor nutrition.  Use steroid medicines, such as prednisone.  Are female.  Are age 44 or older.  Smoke or have a history of smoking.  Are not physically active (are sedentary).  Are white (Caucasian) or of Asian descent.  Have a small body frame.  Take certain medicines, such as antiseizure medicines. What are the signs or symptoms? A fracture might be the first sign of osteoporosis, especially if the fracture results from a fall or injury that usually would not cause a bone to break. Other signs and symptoms include:  Pain in the neck or low back.  Stooped posture.  Loss of height. How is this diagnosed? This condition may be diagnosed based on:  Your medical history.  A physical exam.  A bone mineral density test, also called a DXA or DEXA test (dual-energy X-ray absorptiometry test). This test uses X-rays to measure the amount of minerals in your bones. How is this treated? The goal of treatment is to strengthen your bones and lower your risk for a fracture. Treatment may involve:  Making lifestyle changes, such as: ? Including foods with more calcium and vitamin D in your diet. ? Doing weight-bearing and muscle-strengthening exercises. ? Stopping tobacco use. ? Limiting alcohol intake.  Taking medicine to slow the process of bone loss or to increase bone density.  Taking daily supplements of calcium and vitamin D.  Taking hormone replacement  medicines, such as estrogen for women and testosterone for men.  Monitoring your levels of calcium and vitamin D. Follow these instructions at home:  Activity  Exercise as told by your health care provider. Ask your health care provider what exercises and activities are safe for you. You should do: ? Exercises that make you work against gravity (weight-bearing exercises), such as tai chi, yoga, or walking. ? Exercises to strengthen muscles, such as lifting weights. Lifestyle  Limit alcohol intake to no more than 1 drink a day for nonpregnant women and 2 drinks a day for men. One drink equals 12 oz of beer, 5 oz of wine, or 1 oz of hard liquor.  Do not use any products that contain nicotine or tobacco, such as cigarettes and e-cigarettes. If you need help quitting, ask your health care provider. Preventing falls  Use devices to help you move around (mobility aids) as needed, such as canes, walkers, scooters, or crutches.  Keep rooms well-lit and clutter-free.  Remove tripping hazards from walkways, including cords and throw rugs.  Install grab bars in bathrooms and safety rails on stairs.  Use rubber mats in the bathroom and other areas that are often wet or slippery.  Wear closed-toe shoes that fit well and support your feet. Wear shoes that have rubber soles or low heels.  Review your medicines with your health care provider. Some medicines can cause dizziness or changes in blood pressure, which can increase your risk of falling. General instructions  Include calcium and vitamin D in your diet. Calcium is important for bone health, and vitamin D helps your body to absorb calcium. Good sources of calcium and vitamin D include: ? Certain fatty fish, such as salmon and tuna. ? Products that have calcium and vitamin D added to them (fortified products), such as fortified cereals. ? Egg yolks. ? Cheese. ? Liver.  Take over-the-counter and prescription medicines only as told by  your health care  provider.  Keep all follow-up visits as told by your health care provider. This is important. Contact a health care provider if:  You have never been screened for osteoporosis and you are: ? A woman who is age 57 or older. ? A man who is age 91 or older. Get help right away if:  You fall or injure yourself. Summary  Osteoporosis is thinning and loss of density in your bones. This makes bones more brittle and fragile and more likely to break (fracture),even with minor falls.  The goal of treatment is to strengthen your bones and reduce your risk for a fracture.  Include calcium and vitamin D in your diet. Calcium is important for bone health, and vitamin D helps your body to absorb calcium.  Talk with your health care provider about screening for osteoporosis if you are a woman who is age 64 or older, or a man who is age 31 or older. This information is not intended to replace advice given to you by your health care provider. Make sure you discuss any questions you have with your health care provider. Document Released: 05/08/2005 Document Revised: 07/11/2017 Document Reviewed: 05/23/2017 Elsevier Patient Education  2020 [employer].

## 2019-05-10 NOTE — Progress Notes (Addendum)
Established Patient Office Visit  Subjective:  Patient ID: Kristi Hunter, female    DOB: 1940/09/14  Age: 78 y.o. MRN: [id number]  CC:  Chief Complaint  Patient presents with  . Establish Care    HPI Kristi Hunter presents for establishment of care by way of transfer.  Patient has multiple medical issues that she wants to discuss including hypertension, osteoporosis, glaucoma, osteoarthritis prediabetes and neuropathy.  Currently taking HCTZ for her blood pressure.  She is unable to exercise because the wind has been closed and she prefers exercise classes in lieu of walking.  She is fortunate to have family members who live in the area.  She is interested in taking Prolia for her osteoporosis.  Past Medical History:  Diagnosis Date  . Arthritis   . Cancer ([place])    uterine  . Chicken pox   . Chronic kidney disease   . Emphysema of lung ([place])   . Glaucoma   . Hypertension   . Urine incontinence   . UTI (urinary tract infection)     Past Surgical History:  Procedure Laterality Date  . ABDOMINAL HYSTERECTOMY  2011  . REPLACEMENT TOTAL KNEE Right 2012    Family History  Problem Relation Age of Onset  . Cancer Father        skin  . Alcohol abuse Father   . Asthma Father   . COPD Father   . Heart attack Father   . Heart disease Father   . Cancer Maternal Grandfather 37       colon cancer  . Heart attack Mother   . Heart disease Mother   . Hyperlipidemia Mother   . Hypertension Mother   . Stroke Mother   . Miscarriages / [name] Mother   . Alcohol abuse Brother   . Arthritis Brother   . Drug abuse Brother   . Depression Daughter   . Hypertension Daughter   . Mental illness Daughter   . Depression Son   . Arthritis Maternal Grandmother   . Diabetes Maternal Grandmother   . Hyperlipidemia Maternal Grandmother   . Alcohol abuse Paternal Grandfather   . Arthritis Paternal Grandfather   . Cirrhosis Paternal Grandfather   . Alcohol abuse Brother   .  Breast cancer Paternal Aunt     Social History   Socioeconomic History  . Marital status: Widowed    Spouse name: Not on file  . Number of children: 3  . Years of education: Not on file  . Highest education level: Not on file  Occupational History    Comment: Retired Engineer, maintenance ([employer])  Social Needs  . Financial resource strain: Not on file  . Food insecurity    Worry: Not on file    Inability: Not on file  . Transportation needs    Medical: Not on file    Non-medical: Not on file  Tobacco Use  . Smoking status: Former Smoker    Types: Cigarettes    Quit date: 08/12/1982    Years since quitting: 36.7  . Smokeless tobacco: Never Used  Substance and Sexual Activity  . Alcohol use: Yes    Alcohol/week: 1.0 standard drinks    Types: 1 Glasses of wine per week    Comment: every 1-2 wks.   . Drug use: Never  . Sexual activity: Not on file  Lifestyle  . Physical activity    Days per week: Not on file    Minutes per session: Not on file  . Stress: Not on  file  Relationships  . Social Herbalist on phone: Not on file    Gets together: Not on file    Attends religious service: Not on file    Active member of club or organization: Not on file    Attends meetings of clubs or organizations: Not on file    Relationship status: Not on file  . Intimate partner violence    Fear of current or ex partner: Not on file    Emotionally abused: Not on file    Physically abused: Not on file    Forced sexual activity: Not on file  Other Topics Concern  . Not on file  Social History Narrative  . Not on file    Outpatient Medications Prior to Visit  Medication Sig Dispense Refill  . aspirin 81 MG chewable tablet Chew by mouth daily.    . Calcium Carb-Cholecalciferol (CALCIUM 600 + D PO) Take by mouth.    . Cholecalciferol (VITAMIN D3 PO) Take 50 mcg by mouth.    Marland Kitchen GLUCOSAMINE-CHONDROITIN-VIT D3 PO Take 2,000 Units by mouth.    Marland Kitchen ketoconazole (NIZORAL) 2 % shampoo Apply 1 application  topically 2 (two) times a week. 120 mL 2  . Melatonin 10 MG TABS Take by mouth.    . metroNIDAZOLE (METROGEL) 0.75 % gel Apply 1 application topically 2 (two) times daily. 45 g 1  . Multiple Vitamin (MULTIVITAMIN) tablet Take 1 tablet by mouth daily.    . naproxen sodium (ALEVE) 220 MG tablet Take 220 mg by mouth.    . hydrochlorothiazide (HYDRODIURIL) 25 MG tablet Take 1 tablet (25 mg total) by mouth daily. 90 tablet 1   No facility-administered medications prior to visit.     No Known Allergies  ROS Review of Systems  Constitutional: Negative for chills, diaphoresis, fatigue, fever and unexpected weight change.  HENT: Negative.   Eyes: Negative for photophobia and visual disturbance.  Respiratory: Negative.   Cardiovascular: Negative.   Gastrointestinal: Negative.   Endocrine: Negative for polyphagia.  Musculoskeletal: Positive for arthralgias.  Skin: Negative for pallor and rash.  Allergic/Immunologic: Negative for immunocompromised state.  Hematological: Does not bruise/bleed easily.  Psychiatric/Behavioral: Negative.       Objective:    Physical Exam  Constitutional: She is oriented to person, place, and time. She appears well-developed and well-nourished. No distress.  HENT:  Head: Normocephalic and atraumatic.  Right Ear: External ear normal.  Left Ear: External ear normal.  Mouth/Throat: Oropharynx is clear and moist. No oropharyngeal exudate.  Eyes: Pupils are equal, round, and reactive to light. Conjunctivae are normal. Right eye exhibits no discharge. Left eye exhibits no discharge. No scleral icterus.  Neck: Neck supple. No JVD present. No tracheal deviation present. No thyromegaly present.  Cardiovascular: Normal rate, regular rhythm and normal heart sounds.  Pulmonary/Chest: Effort normal and breath sounds normal. No stridor.  Musculoskeletal:        General: Edema present.  Lymphadenopathy:    She has no cervical adenopathy.  Neurological: She is alert and  oriented to person, place, and time.  Skin: Skin is warm and dry. She is not diaphoretic.  Psychiatric: She has a normal mood and affect. Her behavior is normal.    BP 124/70   Pulse 92   Ht 5' 3.5" (1.613 m)   Wt 271 lb (122.9 kg)   SpO2 97%   BMI 47.25 kg/m  Wt Readings from Last 3 Encounters:  05/10/19 271 lb (122.9 kg)  01/13/19 168  lb 9.6 oz (76.5 kg)  08/14/18 264 lb 12.8 oz (120.1 kg)   BP Readings from Last 3 Encounters:  05/10/19 124/70  01/13/19 140/82  08/14/18 138/90   Guideline developer:  UpToDate (see UpToDate for funding source) Date Released: June 2014  Health Maintenance Due  Topic Date Due  . Samul Dada  08/04/1962  . COLONOSCOPY  08/04/1993  . PNA vac Low Risk Adult (1 of 2 - PCV13) 08/04/2008  . INFLUENZA VACCINE  03/13/2019    There are no preventive care reminders to display for this patient.  Lab Results  Component Value Date   TSH 1.71 01/13/2019   Lab Results  Component Value Date   WBC 7.7 01/13/2019   HGB 13.4 01/13/2019   HCT 40.4 01/13/2019   MCV 83.5 01/13/2019   PLT 317.0 01/13/2019   Lab Results  Component Value Date   NA 139 01/13/2019   K 4.2 01/13/2019   CO2 31 01/13/2019   GLUCOSE 101 (H) 01/13/2019   BUN 16 01/13/2019   CREATININE 0.67 01/13/2019   BILITOT 0.4 01/13/2019   ALKPHOS 73 01/13/2019   AST 19 01/13/2019   ALT 19 01/13/2019   PROT 7.2 01/13/2019   ALBUMIN 3.6 01/13/2019   CALCIUM 9.6 01/13/2019   GFR 85.70 01/13/2019   Lab Results  Component Value Date   CHOL 151 01/13/2019   Lab Results  Component Value Date   HDL 44.90 01/13/2019   Lab Results  Component Value Date   LDLCALC 92 01/13/2019   Lab Results  Component Value Date   TRIG 72.0 01/13/2019   Lab Results  Component Value Date   CHOLHDL 3 01/13/2019   Lab Results  Component Value Date   HGBA1C 6.0 01/13/2019      Assessment & Plan:   Problem List Items Addressed This Visit      Cardiovascular and Mediastinum    Essential hypertension - Primary   Relevant Medications   chlorthalidone (HYGROTON) 25 MG tablet   Other Relevant Orders   Basic metabolic panel   CBC   Urinalysis, Routine w reflex microscopic   Microalbumin / creatinine urine ratio     Musculoskeletal and Integument   Primary osteoarthritis   Relevant Orders   Ambulatory referral to Sports Medicine   Age-related osteoporosis without current pathological fracture     Other   Prediabetes   Relevant Orders   Basic metabolic panel   CBC   Hemoglobin A1c   Urinalysis, Routine w reflex microscopic   Microalbumin / creatinine urine ratio   Glaucoma of both eyes   Relevant Orders   Ambulatory referral to Ophthalmology   Dry scalp      Meds ordered this encounter  Medications  . chlorthalidone (HYGROTON) 25 MG tablet    Sig: Take 1 tablet (25 mg total) by mouth daily.    Dispense:  90 tablet    Refill:  0    Follow-up: Return in about 1 month (around 06/09/2019).  Have changed patient from HCTZ to chlorthalidone 25 mg daily because it is a true 24-hour medication.  Patient was given information on of the osteoarthritis and referred to sports medicine per her request.  She was given information on osteoporosis.  Instructed to take 600 mg of calcium twice daily along with 800 international units of vitamin D daily.  She is interested in Prolia.  Request referral to ophthalmology to follow-up for glaucoma.  Checking patient's hemoglobin A1c to assess her prediabetes.  Will consider refer to  nutritionist for her obesity.

## 2019-05-24 ENCOUNTER — Telehealth: Payer: Self-pay | Admitting: Nurse Practitioner

## 2019-05-24 NOTE — Telephone Encounter (Signed)

## 2019-05-25 ENCOUNTER — Other Ambulatory Visit: Payer: Self-pay

## 2019-05-25 ENCOUNTER — Encounter: Payer: Self-pay | Admitting: Family Medicine

## 2019-05-25 ENCOUNTER — Ambulatory Visit (INDEPENDENT_AMBULATORY_CARE_PROVIDER_SITE_OTHER): Payer: Medicare HMO | Admitting: Behavioral Health

## 2019-05-25 DIAGNOSIS — Z23 Encounter for immunization: Secondary | ICD-10-CM | POA: Diagnosis not present

## 2019-05-25 NOTE — Progress Notes (Signed)
Patient presents in clinic today for Influenza vaccination. IM injection was given in the left deltoid. Patient tolerated the injection well. No signs or symptoms of a reaction were noted prior to patient leaving the nurse visit. 

## 2019-05-28 DIAGNOSIS — Z9889 Other specified postprocedural states: Secondary | ICD-10-CM | POA: Diagnosis not present

## 2019-05-28 DIAGNOSIS — D3131 Benign neoplasm of right choroid: Secondary | ICD-10-CM | POA: Diagnosis not present

## 2019-05-28 DIAGNOSIS — Z961 Presence of intraocular lens: Secondary | ICD-10-CM | POA: Diagnosis not present

## 2019-05-28 DIAGNOSIS — H401131 Primary open-angle glaucoma, bilateral, mild stage: Secondary | ICD-10-CM | POA: Diagnosis not present

## 2019-05-28 DIAGNOSIS — H04123 Dry eye syndrome of bilateral lacrimal glands: Secondary | ICD-10-CM | POA: Diagnosis not present

## 2019-06-07 ENCOUNTER — Ambulatory Visit (HOSPITAL_BASED_OUTPATIENT_CLINIC_OR_DEPARTMENT_OTHER)
Admission: RE | Admit: 2019-06-07 | Discharge: 2019-06-07 | Disposition: A | Discharge status: Home / Self Care | Payer: Medicare HMO | Source: Ambulatory Visit | Attending: Family Medicine | Admitting: Family Medicine

## 2019-06-07 ENCOUNTER — Ambulatory Visit: Payer: Medicare HMO | Admitting: Family Medicine

## 2019-06-07 ENCOUNTER — Encounter: Payer: Self-pay | Admitting: Family Medicine

## 2019-06-07 ENCOUNTER — Ambulatory Visit: Payer: Self-pay

## 2019-06-07 ENCOUNTER — Other Ambulatory Visit: Payer: Self-pay

## 2019-06-07 VITALS — BP 139/75 | HR 73 | Ht 64.0 in | Wt 260.0 lb

## 2019-06-07 DIAGNOSIS — M1710 Unilateral primary osteoarthritis, unspecified knee: Secondary | ICD-10-CM | POA: Insufficient documentation

## 2019-06-07 DIAGNOSIS — M25562 Pain in left knee: Secondary | ICD-10-CM

## 2019-06-07 NOTE — Assessment & Plan Note (Signed)
Pain likely related to degenerative meniscus and degenerative joint line.  Has a moderate effusion on exam.  History of right total knee arthroscopy -Referral to physical therapy. -Counseled on home exercise therapy and supportive care. -Consider medial unloader brace due to thigh to calf ratio. -X-ray -If no improvement consider injection.

## 2019-06-07 NOTE — Patient Instructions (Signed)
Nice to meet you Physical therapy will call you  I will call with the results from today  The [employer] rep will call about the brace Please send me a message in MyChart with any questions or updates.  Please see me back in 4 weeks or sooner if needed.   --Dr. Raeford Razor

## 2019-06-07 NOTE — Progress Notes (Signed)
Kristi Hunter - 78 y.o. female MRN [id number]  Date of birth: Sep 23, 1940  SUBJECTIVE:  Including CC & ROS.  Chief Complaint  Patient presents with  . Knee Pain    left knee x 2 weeks    Kristi Hunter is a 78 y.o. female that is presenting with acute left knee pain.  The pain is intermittent in nature.  It is waking her up at night.  The pain is moderate to severe.  She denies any inciting event.  She has not been working out like she normally does.  She has a history of a right total knee arthroscopy in 2012.  Has not tried anything to improve the pain.  Denies any mechanical symptoms.  Feels pain over the anterior aspect and some posteriorly.   Review of Systems  Constitutional: Negative for fever.  HENT: Negative for congestion.   Respiratory: Negative for cough.   Cardiovascular: Negative for chest pain.  Gastrointestinal: Negative for abdominal pain.  Musculoskeletal: Positive for arthralgias and gait problem.  Skin: Negative for color change.  Neurological: Negative for weakness.  Hematological: Negative for adenopathy.    HISTORY: Past Medical, Surgical, Social, and Family History Reviewed & Updated per EMR.   Pertinent Historical Findings include:  Past Medical History:  Diagnosis Date  . Arthritis   . Cancer ([place])    uterine  . Chicken pox   . Chronic kidney disease   . Emphysema of lung ([place])   . Glaucoma   . Hypertension   . Urine incontinence   . UTI (urinary tract infection)     Past Surgical History:  Procedure Laterality Date  . ABDOMINAL HYSTERECTOMY  2011  . REPLACEMENT TOTAL KNEE Right 2012    Allergies  Allergen Reactions  . Percocet [Oxycodone-Acetaminophen]     Family History  Problem Relation Age of Onset  . Cancer Father        skin  . Alcohol abuse Father   . Asthma Father   . COPD Father   . Heart attack Father   . Heart disease Father   . Cancer Maternal Grandfather 30       colon cancer  . Heart attack Mother   . Heart  disease Mother   . Hyperlipidemia Mother   . Hypertension Mother   . Stroke Mother   . Miscarriages / [name] Mother   . Alcohol abuse Brother   . Arthritis Brother   . Drug abuse Brother   . Depression Daughter   . Hypertension Daughter   . Mental illness Daughter   . Depression Son   . Arthritis Maternal Grandmother   . Diabetes Maternal Grandmother   . Hyperlipidemia Maternal Grandmother   . Alcohol abuse Paternal Grandfather   . Arthritis Paternal Grandfather   . Cirrhosis Paternal Grandfather   . Alcohol abuse Brother   . Breast cancer Paternal Aunt      Social History   Socioeconomic History  . Marital status: Widowed    Spouse name: Not on file  . Number of children: 3  . Years of education: Not on file  . Highest education level: Not on file  Occupational History    Comment: Retired Engineer, maintenance ([employer])  Social Needs  . Financial resource strain: Not on file  . Food insecurity    Worry: Not on file    Inability: Not on file  . Transportation needs    Medical: Not on file    Non-medical: Not on file  Tobacco Use  . Smoking  status: Former Smoker    Types: Cigarettes    Quit date: 08/12/1982    Years since quitting: 36.8  . Smokeless tobacco: Never Used  Substance and Sexual Activity  . Alcohol use: Yes    Alcohol/week: 1.0 standard drinks    Types: 1 Glasses of wine per week    Comment: every 1-2 wks.   . Drug use: Never  . Sexual activity: Not on file  Lifestyle  . Physical activity    Days per week: Not on file    Minutes per session: Not on file  . Stress: Not on file  Relationships  . Social Herbalist on phone: Not on file    Gets together: Not on file    Attends religious service: Not on file    Active member of club or organization: Not on file    Attends meetings of clubs or organizations: Not on file    Relationship status: Not on file  . Intimate partner violence    Fear of current or ex partner: Not on file    Emotionally abused: Not on  file    Physically abused: Not on file    Forced sexual activity: Not on file  Other Topics Concern  . Not on file  Social History Narrative  . Not on file     PHYSICAL EXAM:  VS: BP 139/75   Pulse 73   Ht 5\' 4"  (1.626 m)   Wt 260 lb (117.9 kg)   BMI 44.63 kg/m  Physical Exam Gen: NAD, alert, cooperative with exam, well-appearing ENT: normal lips, normal nasal mucosa,  Eye: normal EOM, normal conjunctiva and lids CV:  no edema, +2 pedal pulses   Resp: no accessory muscle use, non-labored,  Skin: no rashes, no areas of induration  Neuro: normal tone, normal sensation to touch Psych:  normal insight, alert and oriented MSK:  Left knee:  No obvious effusion  TTP along the medial joint line  Normal extension  Limited flexion  Instability with valgus and varus stress testing  Negative McMurray's test. No pain with patellar grind. Neurovascularly intact  Limited ultrasound: left knee:  Moderate effusion in the SPP  Normal appearing QT and PT  Medial joint space narrowing and outpouching of the medial meniscus. Lateral joint space narrowing and outpouching of the medial meniscus.   Summary: Degenerative changes of the joint line and meniscus noted in the medial and lateral aspect  Ultrasound and interpretation by Clearance Coots, MD      ASSESSMENT & PLAN:   Acute pain of left knee Pain likely related to degenerative meniscus and degenerative joint line.  Has a moderate effusion on exam.  History of right total knee arthroscopy -Referral to physical therapy. -Counseled on home exercise therapy and supportive care. -Consider medial unloader brace due to thigh to calf ratio. -X-ray -If no improvement consider injection.

## 2019-06-08 ENCOUNTER — Telehealth: Payer: Self-pay | Admitting: Family Medicine

## 2019-06-08 NOTE — Telephone Encounter (Signed)
Patient returning call for xray results  

## 2019-06-08 NOTE — Telephone Encounter (Signed)
Spoke to patient and gave her information provided by the physician.

## 2019-06-08 NOTE — Telephone Encounter (Signed)
Left VM for patient. If she calls back please have her speak with a nurse/CMA and inform that her xrays show moderate degenerative changes on the inside of the knee. There is narrowing of the space on the inside of the knee. The plan remains the same.   If any questions then please take the best time and phone number to call and I will try to call her back.   Rosemarie Ax, MD [facility] 06/08/2019, 10:13 AM

## 2019-06-15 DIAGNOSIS — M1712 Unilateral primary osteoarthritis, left knee: Secondary | ICD-10-CM | POA: Diagnosis not present

## 2019-06-17 ENCOUNTER — Other Ambulatory Visit: Payer: Self-pay

## 2019-06-17 ENCOUNTER — Ambulatory Visit: Payer: Medicare HMO | Attending: Family Medicine

## 2019-06-17 DIAGNOSIS — R2689 Other abnormalities of gait and mobility: Secondary | ICD-10-CM | POA: Insufficient documentation

## 2019-06-17 DIAGNOSIS — M25562 Pain in left knee: Secondary | ICD-10-CM

## 2019-06-17 NOTE — Therapy (Signed)
[address] [place], [name], [address] Phone: [phone]   Fax:  [phone]  Physical Therapy Evaluation  Patient Details  Name: Kristi Hunter MRN: [id number] Date of Birth: 25-Nov-1940 Referring Provider (PT): Clearance Coots MD   Encounter Date: 06/17/2019  PT End of Session - 06/17/19 1023    Visit Number  1    Number of Visits  11    Date for PT Re-Evaluation  07/29/19    PT Start Time  0934    PT Stop Time  1018    PT Time Calculation (min)  44 min    Activity Tolerance  Patient tolerated treatment well    Behavior During Therapy  [facility] for tasks assessed/performed       Past Medical History:  Diagnosis Date  . Arthritis   . Cancer ([place])    uterine  . Chicken pox   . Chronic kidney disease   . Emphysema of lung ([place])   . Glaucoma   . Hypertension   . Urine incontinence   . UTI (urinary tract infection)     Past Surgical History:  Procedure Laterality Date  . ABDOMINAL HYSTERECTOMY  2011  . REPLACEMENT TOTAL KNEE Right 2012    There were no vitals filed for this visit.   Subjective Assessment - 06/17/19 0936    Subjective  Pt reports that she woke up with excruciating pain in her L knee. She had a total knee replacement on the R knee 7-8 years ago. She had a trainer coming to her home and was going to silver sneakers for years due to covid 19 she has not been able to go to the gym and has been trying to do exercises on sit and fit on the TV. She states that she has been using a walker in the house and has been experiencing excruciating pain/cramping at the L calf. She states that she has a lot of arthritis and osteoporosis. She has been unable to walk for 2-3 blocks for the past few years. Pt reports that she is trying to avoid surgery on the L knee. She is waiting on a knee brace that has already been ordered for her but has not recieved it yet. Pt reports she is experiencing some new  pain in her R ankle as well due to she is favoring the LLE.    Pertinent History  Arthritis, osteoporosis, R TKA, hx cancer, CKD, HTN    Limitations  Standing;Walking    Diagnostic tests  [name]: Degenerative changes of the joint line and meniscus noted in the medial and lateral aspect per imaging report    Patient Stated Goals  I want to walk normal and not have to use a cane or a walker.    Currently in Pain?  Yes    Pain Score  8     Pain Location  Knee    Pain Orientation  Left    Pain Descriptors / Indicators  Aching;Shooting    Pain Type  Acute pain    Pain Onset  1 to 4 weeks ago    Pain Frequency  Intermittent    Aggravating Factors   walking, standing, when she goes to be at night.    Pain Relieving Factors  rest, sitting    Multiple Pain Sites  No         [facility] PT Assessment - 06/17/19 0001      Assessment   Medical Diagnosis  L knee pain    Referring Provider (PT)  Clearance Coots MD    Onset Date/Surgical Date  05/27/19    Prior Therapy  Not for her L knee      Restrictions   Weight Bearing Restrictions  No      Balance Screen   Has the patient fallen in the past 6 months  No    Has the patient had a decrease in activity level because of a fear of falling?   Yes    Is the patient reluctant to leave their home because of a fear of falling?   Yes   becuase of pain she cannot go to her grand sons soccer games     [place] residence    [place] Access  Level entry    [place] bars - tub/shower;Walker - 2 wheels      Prior Function   Level of Independence  Independent    Vocation  Retired    Leisure  Pt watches her 56 year old grandson who she has to chase around. 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       Cognition   Overall Cognitive Status  Within Functional Limits for tasks assessed       ROM / Strength   AROM / PROM / Strength  AROM;Strength      AROM   AROM Assessment Site  Knee    Right/Left Knee  Right;Left    Right Knee Extension  0    Right Knee Flexion  85    Left Knee Extension  4    Left Knee Flexion  78      Strength   Strength Assessment Site  Hip;Knee;Ankle    Right/Left Hip  Right;Left    Right Hip Flexion  3+/5    Right Hip ABduction  3+/5    Right Hip ADduction  4/5    Left Hip Flexion  3+/5    Left Hip ABduction  3+/5    Left Hip ADduction  4/5    Right/Left Knee  Right;Left    Right Knee Flexion  4+/5    Right Knee Extension  5/5    Left Knee Flexion  4+/5    Left Knee Extension  4-/5    Right/Left Ankle  Right;Left    Right Ankle Dorsiflexion  5/5    Left Ankle Dorsiflexion  5/5                Objective measurements completed on examination: See above findings.      [facility] Adult PT Treatment/Exercise - 06/17/19 0001      Exercises   Exercises  Knee/Hip      Knee/Hip Exercises: Stretches   Gastroc Stretch  Left;2 reps;20 seconds    Gastroc Stretch Limitations  Pt requires VC and tactile cueing to not perform significant pull into pain and to perform light stretching as well as tactile cueing for placement of towel.       Knee/Hip Exercises: Seated   Abduction/Adduction   Strengthening;Both;1 set;20 reps;10 reps    Abd/Adduction Limitations  pt performed hip abd 20x with Red theraband and hip add with pillow squeeze with VC to hold for entire time of 10 seconds for 10 repetitions pt was provided  w/demonstration for correct movement for knee stabilization.              PT Education - 06/17/19 1013    Education Details  Access Code: [email or web address], Pt was provided with HEP she was educated on the relationship between the hip, knee and ankle in [place] and how each compenent needs to be stable/strong in order to decrease stress on the other joints. Pt was educated on rest, ice, compression and elevation in order to decrease edema  at the L knee. Pt was educated on the effects of pain on the opposite leg/ankle on the contralateral limb including previous TKA on the R and ankle pain on the R increased stress on the L knee.    Person(s) Educated  Patient    Methods  Explanation;Demonstration;Tactile cues;Verbal cues;Handout    Comprehension  Verbalized understanding;Returned demonstration       PT Short Term Goals - 06/17/19 1242      PT SHORT TERM GOAL #1   Title  Pt will be independent with initial HEP in order to demonstrate autonomy of care.    Baseline  Pt does not have an HEP    Time  2    Period  Weeks    Status  New    Target Date  07/08/19      PT SHORT TERM GOAL #2   Title  Pt will demonstrate 85 degrees L knee flexion and 0 degrees L knee extension in order to demonstrate improved functional ROM to decrease stress on surrounding musculature and to match the RLE.    Baseline  L knee flexion 78, L knee extension 4 degrees    Time  3    Period  Weeks    Status  New    Target Date  07/15/19        PT Long Term Goals - 06/17/19 1245      PT LONG TERM GOAL #1   Title  Pt will demonstrate 4/5 strength Bil hip flexion/abd and 5/5 strength L knee extension in order to demonstrate improved functional strength.    Baseline  Bil hip flexion/abd 3+/5, L knee extension 4-/5    Time  5    Period  Weeks    Status  New    Target Date  07/29/19      PT LONG TERM GOAL #2   Title  Pt will report pain 3/10 or less within 5 weeks in order to demonstrate a statistically significant decrease in pain in the LLE to improve quality of life and decrease risk for immobility.    Baseline  8/10    Time  5    Period  Weeks    Status  New    Target Date  07/29/19      PT LONG TERM GOAL #3   Title  Pt will ambulate with LRAD with fluid steps equal stride and step length BIL w/o hip hiking or antalgic gait within 6 weeks to decrease increased stress on contralateral LE.    Baseline  Pt is ambulating with an antalgic gait  with preference for the RLE    Time  5    Period  Weeks    Status  New    Target Date  07/29/19             Plan - 06/17/19 1025    Clinical Impression Statement  Pt presents to physical therapy with pain in her L knee. She has significant weakness in Bil  hip abd and L knee extension. Pt has slight decrease in L knee ROM compared to the R and has an antalgic gait favoring the L side. She is experiencing pain in her R ankle that is also most likely adding to stress on the L knee. Pt will benefit from skilled physical therapy services in order to decrease pain and to improve mobility in order to decrease risk for injury.    Personal Factors and Comorbidities  Comorbidity 3+    Comorbidities  Arthritis, osteoporosis, R TKA, hx cancer, CKD, HTN    Stability/Clinical Decision Making  Stable/Uncomplicated    Clinical Decision Making  Low    Rehab Potential  Good    PT Frequency  2x / week    PT Duration  --   5 weeks   PT Treatment/Interventions  ADLs/Self Care Home Management;Moist Heat;Electrical Stimulation;Cryotherapy;Gait training;Stair training;Functional mobility training;Therapeutic activities;Therapeutic exercise;Balance training;Neuromuscular re-education;Patient/family education;Manual techniques;Taping    PT Next Visit Plan  Continue with easy quad/hip and ankle stabilization exercises, try Nustep and ROM activities    PT Home Exercise Plan  Access Code: [email or web address]    Consulted and Agree with Plan of Care  Patient       Patient will benefit from skilled therapeutic intervention in order to improve the following deficits and impairments:  Decreased range of motion, Pain, Decreased strength  Visit Diagnosis: Acute pain of left knee  Other abnormalities of gait and mobility     Problem List Patient Active Problem List   Diagnosis Date Noted  . Acute pain of left knee 06/07/2019  . Prediabetes 05/10/2019  . Glaucoma of both eyes 05/10/2019  . Essential hypertension  05/10/2019  . Dry scalp 05/10/2019  . Age-related osteoporosis without current pathological fracture 05/10/2019  . Seborrheic keratosis 01/11/2019  . Rosacea 01/11/2019  . Family history of colon cancer 01/11/2019  . Edema 08/27/2018  . Primary osteoarthritis 08/27/2018  . Sciatica of right side 08/27/2018  . Depression, major, single episode, complete remission ([place]) 08/27/2018  . Vitamin D deficiency 08/27/2018  . Mixed hyperlipidemia 08/27/2018  . Anxiety 08/27/2018  . Hyperglycemia 08/27/2018  . HTN (hypertension), benign 08/27/2018    Ander Purpura, PT 06/17/2019, 12:50 PM  [address] [place], [name], [address] Phone: [phone]   Fax:  [phone]  Name: Tarea Ridder MRN: [id number] Date of Birth: 11/12/1940

## 2019-06-17 NOTE — Patient Instructions (Signed)
Access Code: [email or web address]  URL: [email or web address]  Date: 06/17/2019  Prepared by: Tomma Rakers   Exercises Seated Hip Abduction with Resistance - 10 reps - 1 sets - 2-3x daily - 7x weekly Seated Hip Adduction Squeeze with Ball - 10 reps - 1 sets - 10 seconds hold - 2-3x daily - 7x weekly Seated Gastroc Stretch with Strap - 3 reps - 1 sets - 20 seconds hold - 1x daily - 7x weekly

## 2019-06-22 ENCOUNTER — Ambulatory Visit: Payer: Medicare HMO | Admitting: Physical Therapy

## 2019-06-22 ENCOUNTER — Other Ambulatory Visit: Payer: Self-pay

## 2019-06-22 DIAGNOSIS — M25562 Pain in left knee: Secondary | ICD-10-CM

## 2019-06-22 DIAGNOSIS — R2689 Other abnormalities of gait and mobility: Secondary | ICD-10-CM | POA: Diagnosis not present

## 2019-06-22 NOTE — Therapy (Cosign Needed)
[address] [place], [name], [address] Phone: [phone]   Fax:  [phone]  Physical Therapy Treatment  Patient Details  Name: Kristi Hunter MRN: [id number] Date of Birth: 03/29/1941 Referring Provider (PT): Clearance Coots MD   Encounter Date: 06/22/2019  PT End of Session - 06/22/19 1056    Visit Number  2    PT Start Time  [id number]    PT Stop Time  1056    PT Time Calculation (min)  41 min       Past Medical History:  Diagnosis Date  . Arthritis   . Cancer ([place])    uterine  . Chicken pox   . Chronic kidney disease   . Emphysema of lung ([place])   . Glaucoma   . Hypertension   . Urine incontinence   . UTI (urinary tract infection)     Past Surgical History:  Procedure Laterality Date  . ABDOMINAL HYSTERECTOMY  2011  . REPLACEMENT TOTAL KNEE Right 2012    There were no vitals filed for this visit.  Subjective Assessment - 06/22/19 1021    Subjective  "feeling some pain in the L knee". "knee feels like is going to walk".    Currently in Pain?  Yes    Pain Score  3     Pain Location  Knee    Pain Orientation  Left                       [facility] Adult PT Treatment/Exercise - 06/22/19 0001      Ambulation/Gait   Ambulation/Gait  Yes    Ambulation/Gait Assistance  7: Independent    Assistive device  None    Gait Pattern  Decreased stance time - left    Ambulation Surface  Level;Indoor      Knee/Hip Exercises: Aerobic   Nustep  L 3 x 5 min      Knee/Hip Exercises: Seated   Long Arc Quad  Strengthening;Left;20 reps    Ball Squeeze  2 x 10    Abduction/Adduction   Strengthening;Both;1 set;20 reps;10 reps    Abd/Adduction Limitations  pt performed hip abd 20x with Red theraband and hip add with pillow squeeze with VC to hold for entire time of 10 seconds for 10 repetitions pt was provided w/demonstration for correct movement for knee stabilization.     Sit to Sand  10  reps;without UE support               PT Short Term Goals - 06/17/19 1242      PT SHORT TERM GOAL #1   Title  Pt will be independent with initial HEP in order to demonstrate autonomy of care.    Baseline  Pt does not have an HEP    Time  2    Period  Weeks    Status  New    Target Date  07/08/19      PT SHORT TERM GOAL #2   Title  Pt will demonstrate 85 degrees L knee flexion and 0 degrees L knee extension in order to demonstrate improved functional ROM to decrease stress on surrounding musculature and to match the RLE.    Baseline  L knee flexion 78, L knee extension 4 degrees    Time  3    Period  Weeks    Status  New    Target Date  07/15/19  PT Long Term Goals - 06/17/19 1245      PT LONG TERM GOAL #1   Title  Pt will demonstrate 4/5 strength Bil hip flexion/abd and 5/5 strength L knee extension in order to demonstrate improved functional strength.    Baseline  Bil hip flexion/abd 3+/5, L knee extension 4-/5    Time  5    Period  Weeks    Status  New    Target Date  07/29/19      PT LONG TERM GOAL #2   Title  Pt will report pain 3/10 or less within 5 weeks in order to demonstrate a statistically significant decrease in pain in the LLE to improve quality of life and decrease risk for immobility.    Baseline  8/10    Time  5    Period  Weeks    Status  New    Target Date  07/29/19      PT LONG TERM GOAL #3   Title  Pt will ambulate with LRAD with fluid steps equal stride and step length BIL w/o hip hiking or antalgic gait within 6 weeks to decrease increased stress on contralateral LE.    Baseline  Pt is ambulating with an antalgic gait with preference for the RLE    Time  5    Period  Weeks    Status  New    Target Date  07/29/19            Plan - 06/22/19 1057    Clinical Impression Statement  Pt able to walk down hallway, but had to rest due to SOB. She has an antalgic gait on the left side. Pt performed STS w/o UE support. pt needs cues  with hip abduction to improve form and technique. pt needs cues for redirection as she is highly distractible.    Personal Factors and Comorbidities  Comorbidity 3+    Comorbidities  Arthritis, osteoporosis, R TKA, hx cancer, CKD, HTN    Stability/Clinical Decision Making  Stable/Uncomplicated    Rehab Potential  Good    PT Frequency  2x / week    PT Treatment/Interventions  ADLs/Self Care Home Management;Moist Heat;Electrical Stimulation;Cryotherapy;Gait training;Stair training;Functional mobility training;Therapeutic activities;Therapeutic exercise;Balance training;Neuromuscular re-education;Patient/family education;Manual techniques;Taping    PT Next Visit Plan  Continue with easy quad/hip and ankle stabilization exercises, try Nustep and ROM activities       Patient will benefit from skilled therapeutic intervention in order to improve the following deficits and impairments:  Decreased range of motion, Pain, Decreased strength  Visit Diagnosis: Acute pain of left knee     Problem List Patient Active Problem List   Diagnosis Date Noted  . Acute pain of left knee 06/07/2019  . Prediabetes 05/10/2019  . Glaucoma of both eyes 05/10/2019  . Essential hypertension 05/10/2019  . Dry scalp 05/10/2019  . Age-related osteoporosis without current pathological fracture 05/10/2019  . Seborrheic keratosis 01/11/2019  . Rosacea 01/11/2019  . Family history of colon cancer 01/11/2019  . Edema 08/27/2018  . Primary osteoarthritis 08/27/2018  . Sciatica of right side 08/27/2018  . Depression, major, single episode, complete remission ([place]) 08/27/2018  . Vitamin D deficiency 08/27/2018  . Mixed hyperlipidemia 08/27/2018  . Anxiety 08/27/2018  . Hyperglycemia 08/27/2018  . HTN (hypertension), benign 08/27/2018    [name], [place] 06/22/2019, 11:48 AM  [address] [place], [name], [address] Phone:  [phone]   Fax:  [phone]  Name: Kristi Hunter MRN: [id number] Date of Birth: February 25, 1941

## 2019-06-22 NOTE — Therapy (Signed)
[address] [place], [name], [address] Phone: [phone]   Fax:  [phone]  Physical Therapy Treatment  Patient Details  Name: Kristi Hunter MRN: [id number] Date of Birth: 06/16/1941 Referring Provider (PT): Clearance Coots MD   Encounter Date: 06/22/2019  PT End of Session - 06/22/19 1056    Visit Number  2    PT Start Time  [id number]    PT Stop Time  1056    PT Time Calculation (min)  41 min       Past Medical History:  Diagnosis Date  . Arthritis   . Cancer ([place])    uterine  . Chicken pox   . Chronic kidney disease   . Emphysema of lung ([place])   . Glaucoma   . Hypertension   . Urine incontinence   . UTI (urinary tract infection)     Past Surgical History:  Procedure Laterality Date  . ABDOMINAL HYSTERECTOMY  2011  . REPLACEMENT TOTAL KNEE Right 2012    There were no vitals filed for this visit.  Subjective Assessment - 06/22/19 1021    Subjective  "feeling some pain in the L knee". "knee feels like is going to walk".    Currently in Pain?  Yes    Pain Score  3     Pain Location  Knee    Pain Orientation  Left                       [facility] Adult PT Treatment/Exercise - 06/22/19 0001      Ambulation/Gait   Ambulation/Gait  Yes    Ambulation/Gait Assistance  7: Independent    Assistive device  None    Gait Pattern  Decreased stance time - left    Ambulation Surface  Level;Indoor      Knee/Hip Exercises: Aerobic   Nustep  L 3 x 5 min      Knee/Hip Exercises: Seated   Long Arc Quad  Strengthening;Left;20 reps    Ball Squeeze  2 x 10    Abduction/Adduction   Strengthening;Both;1 set;20 reps;10 reps    Abd/Adduction Limitations  pt performed hip abd 20x with Red theraband and hip add with pillow squeeze with VC to hold for entire time of 10 seconds for 10 repetitions pt was provided w/demonstration for correct movement for knee stabilization.     Sit to Sand  10  reps;without UE support      Manual Therapy   Manual Therapy  Joint mobilization;Soft tissue mobilization    Manual therapy comments  tenderness around medial patella    Joint Mobilization  patella mob               PT Short Term Goals - 06/17/19 1242      PT SHORT TERM GOAL #1   Title  Pt will be independent with initial HEP in order to demonstrate autonomy of care.    Baseline  Pt does not have an HEP    Time  2    Period  Weeks    Status  New    Target Date  07/08/19      PT SHORT TERM GOAL #2   Title  Pt will demonstrate 85 degrees L knee flexion and 0 degrees L knee extension in order to demonstrate improved functional ROM to decrease stress on surrounding musculature and to match the RLE.    Baseline  L knee flexion 78,  L knee extension 4 degrees    Time  3    Period  Weeks    Status  New    Target Date  07/15/19        PT Long Term Goals - 06/17/19 1245      PT LONG TERM GOAL #1   Title  Pt will demonstrate 4/5 strength Bil hip flexion/abd and 5/5 strength L knee extension in order to demonstrate improved functional strength.    Baseline  Bil hip flexion/abd 3+/5, L knee extension 4-/5    Time  5    Period  Weeks    Status  New    Target Date  07/29/19      PT LONG TERM GOAL #2   Title  Pt will report pain 3/10 or less within 5 weeks in order to demonstrate a statistically significant decrease in pain in the LLE to improve quality of life and decrease risk for immobility.    Baseline  8/10    Time  5    Period  Weeks    Status  New    Target Date  07/29/19      PT LONG TERM GOAL #3   Title  Pt will ambulate with LRAD with fluid steps equal stride and step length BIL w/o hip hiking or antalgic gait within 6 weeks to decrease increased stress on contralateral LE.    Baseline  Pt is ambulating with an antalgic gait with preference for the RLE    Time  5    Period  Weeks    Status  New    Target Date  07/29/19            Plan - 06/22/19 1057     Clinical Impression Statement  Pt able to walk down hallway, but had to rest due to SOB. She has an antalgic gait on the left side. Pt performed STS w/o UE support. pt needs cues with hip abduction to improve form and technique. pt needs cues for redirection as she is highly distractible. pt has TP medial to the patella and needs cues to relax.    Personal Factors and Comorbidities  Comorbidity 3+    Comorbidities  Arthritis, osteoporosis, R TKA, hx cancer, CKD, HTN    Stability/Clinical Decision Making  Stable/Uncomplicated    Rehab Potential  Good    PT Frequency  2x / week    PT Treatment/Interventions  ADLs/Self Care Home Management;Moist Heat;Electrical Stimulation;Cryotherapy;Gait training;Stair training;Functional mobility training;Therapeutic activities;Therapeutic exercise;Balance training;Neuromuscular re-education;Patient/family education;Manual techniques;Taping    PT Next Visit Plan  Continue with easy quad/hip and ankle stabilization exercises, try Nustep and ROM activities       Patient will benefit from skilled therapeutic intervention in order to improve the following deficits and impairments:  Decreased range of motion, Pain, Decreased strength  Visit Diagnosis: Acute pain of left knee     Problem List Patient Active Problem List   Diagnosis Date Noted  . Acute pain of left knee 06/07/2019  . Prediabetes 05/10/2019  . Glaucoma of both eyes 05/10/2019  . Essential hypertension 05/10/2019  . Dry scalp 05/10/2019  . Age-related osteoporosis without current pathological fracture 05/10/2019  . Seborrheic keratosis 01/11/2019  . Rosacea 01/11/2019  . Family history of colon cancer 01/11/2019  . Edema 08/27/2018  . Primary osteoarthritis 08/27/2018  . Sciatica of right side 08/27/2018  . Depression, major, single episode, complete remission ([place]) 08/27/2018  . Vitamin D deficiency 08/27/2018  . Mixed hyperlipidemia 08/27/2018  .  Anxiety 08/27/2018  . Hyperglycemia  08/27/2018  . HTN (hypertension), benign 08/27/2018    [name], [place] 06/22/2019, 1:04 PM  [address] [place], [name], [address] Phone: [phone]   Fax:  [phone]  Name: Kristi Hunter MRN: [id number] Date of Birth: [birth year]

## 2019-06-25 ENCOUNTER — Other Ambulatory Visit: Payer: Self-pay

## 2019-06-25 ENCOUNTER — Ambulatory Visit: Payer: Medicare HMO | Admitting: Physical Therapy

## 2019-06-25 DIAGNOSIS — R2689 Other abnormalities of gait and mobility: Secondary | ICD-10-CM | POA: Diagnosis not present

## 2019-06-25 DIAGNOSIS — M25562 Pain in left knee: Secondary | ICD-10-CM | POA: Diagnosis not present

## 2019-06-25 NOTE — Therapy (Signed)
[place], [name], [address] Phone: [phone]   Fax:  [phone]  Physical Therapy Treatment  Patient Details  Name: Kristi Hunter MRN: [id number] Date of Birth: 1940/10/25 Referring Provider (PT): Clearance Coots MD   Encounter Date: 06/25/2019  PT End of Session - 06/25/19 1101    Visit Number  3    PT Start Time  1017    PT Stop Time  1100    PT Time Calculation (min)  43 min       Past Medical History:  Diagnosis Date  . Arthritis   . Cancer ([place])    uterine  . Chicken pox   . Chronic kidney disease   . Emphysema of lung ([place])   . Glaucoma   . Hypertension   . Urine incontinence   . UTI (urinary tract infection)     Past Surgical History:  Procedure Laterality Date  . ABDOMINAL HYSTERECTOMY  2011  . REPLACEMENT TOTAL KNEE Right 2012    There were no vitals filed for this visit.  Subjective Assessment - 06/25/19 1025    Subjective  "having pain in the knee"    Currently in Pain?  Yes    Pain Score  5     Pain Location  Knee    Pain Orientation  Left                       [facility] Adult PT Treatment/Exercise - 06/25/19 0001      Knee/Hip Exercises: Aerobic   Nustep  L 3 x 5 min      Knee/Hip Exercises: Machines for Strengthening   Cybex Knee Extension  2 x 10 5#    Cybex Knee Flexion  2 x 10 10#      Knee/Hip Exercises: Seated   Long Arc Quad  Strengthening;Left;20 reps   1#   Ball Squeeze  2 x 10    Marching  Strengthening;Both;20 reps    Hamstring Curl  Left;Strengthening;20 reps    Abduction/Adduction   Strengthening;Both;1 set;20 reps;10 reps    Abd/Adduction Limitations  pt performed hip abd 20x with Red theraband and hip add with pillow squeeze with VC to hold for entire time of 10 seconds for 10 repetitions pt was provided w/demonstration for correct movement for knee stabilization.                PT Short Term Goals -  06/25/19 1026      PT SHORT TERM GOAL #1   Title  Pt will be independent with initial HEP in order to demonstrate autonomy of care.    Status  Achieved      PT SHORT TERM GOAL #2   Title  Pt will demonstrate 85 degrees L knee flexion and 0 degrees L knee extension in order to demonstrate improved functional ROM to decrease stress on surrounding musculature and to match the RLE.    Status  Achieved        PT Long Term Goals - 06/25/19 1106      PT LONG TERM GOAL #2   Title  Pt will report pain 3/10 or less within 5 weeks in order to demonstrate a statistically significant decrease in pain in the LLE to improve quality of life and decrease risk for immobility.    Status  On-going      PT LONG TERM GOAL #3   Title  Pt will ambulate  with LRAD with fluid steps equal stride and step length BIL w/o hip hiking or antalgic gait within 6 weeks to decrease increased stress on contralateral LE.    Status  On-going            Plan - 06/25/19 1103    Clinical Impression Statement  pt progressed to knee flexion and extension on the machines. pt was educated on how to put on her knee brace. pt states that she has people coming over to help her put it on 5 days a week. pt needs cues for redirection as she is highly distractible. pt needs tactile cues for marching to improve posture.    Personal Factors and Comorbidities  Comorbidity 3+       Patient will benefit from skilled therapeutic intervention in order to improve the following deficits and impairments:  Decreased range of motion, Pain, Decreased strength  Visit Diagnosis: Acute pain of left knee     Problem List Patient Active Problem List   Diagnosis Date Noted  . Acute pain of left knee 06/07/2019  . Prediabetes 05/10/2019  . Glaucoma of both eyes 05/10/2019  . Essential hypertension 05/10/2019  . Dry scalp 05/10/2019  . Age-related osteoporosis without current pathological fracture 05/10/2019  . Seborrheic keratosis  01/11/2019  . Rosacea 01/11/2019  . Family history of colon cancer 01/11/2019  . Edema 08/27/2018  . Primary osteoarthritis 08/27/2018  . Sciatica of right side 08/27/2018  . Depression, major, single episode, complete remission ([place]) 08/27/2018  . Vitamin D deficiency 08/27/2018  . Mixed hyperlipidemia 08/27/2018  . Anxiety 08/27/2018  . Hyperglycemia 08/27/2018  . HTN (hypertension), benign 08/27/2018    Kristi Hunter, [place] 06/25/2019, 11:06 AM  [facility] [address] [place], [name], [address] Phone: [phone]   Fax:  [phone]  Name: Kristi Hunter MRN: [id number] Date of Birth: 06/14/1941

## 2019-06-29 ENCOUNTER — Other Ambulatory Visit: Payer: Self-pay

## 2019-06-29 ENCOUNTER — Ambulatory Visit: Payer: Medicare HMO | Admitting: Physical Therapy

## 2019-06-29 DIAGNOSIS — M25562 Pain in left knee: Secondary | ICD-10-CM | POA: Diagnosis not present

## 2019-06-29 DIAGNOSIS — R2689 Other abnormalities of gait and mobility: Secondary | ICD-10-CM | POA: Diagnosis not present

## 2019-06-29 NOTE — Therapy (Signed)
[facility], [name], [address] Phone: [phone]   Fax:  [phone]  Physical Therapy Treatment  Patient Details  Name: Geniva Lootens MRN: [id number] Date of Birth: 06/05/1941 Referring Provider (PT): Clearance Coots MD   Encounter Date: 06/29/2019  PT End of Session - 06/29/19 1056    Visit Number  4    PT Start Time  [id number]    PT Stop Time  1057    PT Time Calculation (min)  42 min       Past Medical History:  Diagnosis Date  . Arthritis   . Cancer ([place])    uterine  . Chicken pox   . Chronic kidney disease   . Emphysema of lung ([place])   . Glaucoma   . Hypertension   . Urine incontinence   . UTI (urinary tract infection)     Past Surgical History:  Procedure Laterality Date  . ABDOMINAL HYSTERECTOMY  2011  . REPLACEMENT TOTAL KNEE Right 2012    There were no vitals filed for this visit.  Subjective Assessment - 06/29/19 1019    Subjective  "knees are not doing good today"    Currently in Pain?  Yes    Pain Score  4     Pain Location  Knee    Pain Orientation  Left                       [facility] Adult PT Treatment/Exercise - 06/29/19 0001      High Level Balance   High Level Balance Activities  Side stepping;Marching forwards;Backward walking   w/ 3#     Knee/Hip Exercises: Aerobic   Nustep  L 3 x 5 min      Knee/Hip Exercises: Machines for Strengthening   Cybex Knee Extension  2 x 10 5#    Cybex Knee Flexion  2 x 10 15#      Knee/Hip Exercises: Standing   Walking with Sports Cord  resisted gait fwd/bck, 40# x 3               PT Short Term Goals - 06/25/19 1026      PT SHORT TERM GOAL #1   Title  Pt will be independent with initial HEP in order to demonstrate autonomy of care.    Status  Achieved      PT SHORT TERM GOAL #2   Title  Pt will demonstrate 85 degrees L knee flexion and 0 degrees L knee extension in order to demonstrate  improved functional ROM to decrease stress on surrounding musculature and to match the RLE.    Status  Achieved        PT Long Term Goals - 06/25/19 1106      PT LONG TERM GOAL #2   Title  Pt will report pain 3/10 or less within 5 weeks in order to demonstrate a statistically significant decrease in pain in the LLE to improve quality of life and decrease risk for immobility.    Status  On-going      PT LONG TERM GOAL #3   Title  Pt will ambulate with LRAD with fluid steps equal stride and step length BIL w/o hip hiking or antalgic gait within 6 weeks to decrease increased stress on contralateral LE.    Status  On-going            Plan - 06/29/19 1057    Clinical  Impression Statement  Pt progressed to resisted gait with CGA to min assist. pt needs cues to go slow and controlled. pt progressed to standing exercises. pt complains of dizziness with marching, backward walking, and sidestepping. pt able to increase weight with knee flexion. increased pain in L knee with knee extension. pt needs cues for redirection as she is highly distractible.    Personal Factors and Comorbidities  Comorbidity 3+    Comorbidities  Arthritis, osteoporosis, R TKA, hx cancer, CKD, HTN    Stability/Clinical Decision Making  Stable/Uncomplicated    Rehab Potential  Good    PT Frequency  2x / week    PT Treatment/Interventions  ADLs/Self Care Home Management;Moist Heat;Electrical Stimulation;Cryotherapy;Gait training;Stair training;Functional mobility training;Therapeutic activities;Therapeutic exercise;Balance training;Neuromuscular re-education;Patient/family education;Manual techniques;Taping    PT Next Visit Plan  Continue with easy quad/hip and ankle stabilization exercises, try Nustep and ROM activities       Patient will benefit from skilled therapeutic intervention in order to improve the following deficits and impairments:  Decreased range of motion, Pain, Decreased strength  Visit  Diagnosis: Acute pain of left knee  Other abnormalities of gait and mobility     Problem List Patient Active Problem List   Diagnosis Date Noted  . Acute pain of left knee 06/07/2019  . Prediabetes 05/10/2019  . Glaucoma of both eyes 05/10/2019  . Essential hypertension 05/10/2019  . Dry scalp 05/10/2019  . Age-related osteoporosis without current pathological fracture 05/10/2019  . Seborrheic keratosis 01/11/2019  . Rosacea 01/11/2019  . Family history of colon cancer 01/11/2019  . Edema 08/27/2018  . Primary osteoarthritis 08/27/2018  . Sciatica of right side 08/27/2018  . Depression, major, single episode, complete remission ([place]) 08/27/2018  . Vitamin D deficiency 08/27/2018  . Mixed hyperlipidemia 08/27/2018  . Anxiety 08/27/2018  . Hyperglycemia 08/27/2018  . HTN (hypertension), benign 08/27/2018    [name], [place] 06/29/2019, 11:02 AM  [place], [name], [address] Phone: [phone]   Fax:  [phone]  Name: Margene Depaola MRN: [id number] Date of Birth: 1941-02-06

## 2019-07-01 ENCOUNTER — Other Ambulatory Visit: Payer: Self-pay

## 2019-07-01 ENCOUNTER — Ambulatory Visit: Payer: Medicare HMO | Admitting: Physical Therapy

## 2019-07-01 DIAGNOSIS — M25562 Pain in left knee: Secondary | ICD-10-CM

## 2019-07-01 DIAGNOSIS — R2689 Other abnormalities of gait and mobility: Secondary | ICD-10-CM | POA: Diagnosis not present

## 2019-07-01 NOTE — Therapy (Signed)
[facility] [address] [place], [name], [address] Phone: [phone]   Fax:  [phone]  Physical Therapy Treatment  Patient Details  Name: Kristi Hunter MRN: [id number] Date of Birth: Mar 02, 1941 Referring Provider (PT): Clearance Coots MD   Encounter Date: 07/01/2019  PT End of Session - 07/01/19 1056    Visit Number  5    PT Start Time  1020    PT Stop Time  1055    PT Time Calculation (min)  35 min       Past Medical History:  Diagnosis Date  . Arthritis   . Cancer ([place])    uterine  . Chicken pox   . Chronic kidney disease   . Emphysema of lung ([place])   . Glaucoma   . Hypertension   . Urine incontinence   . UTI (urinary tract infection)     Past Surgical History:  Procedure Laterality Date  . ABDOMINAL HYSTERECTOMY  2011  . REPLACEMENT TOTAL KNEE Right 2012    There were no vitals filed for this visit.  Subjective Assessment - 07/01/19 1027    Subjective  "knee is not doing well today"    Currently in Pain?  Yes    Pain Score  3     Pain Location  Knee    Pain Orientation  Left                       [facility] Adult PT Treatment/Exercise - 07/01/19 0001      Knee/Hip Exercises: Aerobic   Nustep  L 3 x 5 min      Knee/Hip Exercises: Machines for Strengthening   Cybex Knee Extension  2 x 10 10#    Cybex Knee Flexion  2 x 15 15#      Knee/Hip Exercises: Standing   Forward Step Up  Left;Hand Hold: 2;Step Height: 4";20 reps      Knee/Hip Exercises: Seated   Sit to Sand  10 reps;without UE support               PT Short Term Goals - 06/25/19 1026      PT SHORT TERM GOAL #1   Title  Pt will be independent with initial HEP in order to demonstrate autonomy of care.    Status  Achieved      PT SHORT TERM GOAL #2   Title  Pt will demonstrate 85 degrees L knee flexion and 0 degrees L knee extension in order to demonstrate improved functional ROM to decrease stress  on surrounding musculature and to match the RLE.    Status  Achieved        PT Long Term Goals - 07/01/19 1041      PT LONG TERM GOAL #1   Title  Pt will demonstrate 4/5 strength Bil hip flexion/abd and 5/5 strength L knee extension in order to demonstrate improved functional strength.    Status  Partially Met      PT LONG TERM GOAL #2   Title  Pt will report pain 3/10 or less within 5 weeks in order to demonstrate a statistically significant decrease in pain in the LLE to improve quality of life and decrease risk for immobility.    Status  On-going      PT LONG TERM GOAL #3   Title  Pt will ambulate with LRAD with fluid steps equal stride and step length BIL w/o hip hiking or  antalgic gait within 6 weeks to decrease increased stress on contralateral LE.    Status  Partially Met            Plan - 07/01/19 1028    Clinical Impression Statement  pt arrived 5 mintutes late. pt was assisted with donning her brace. brace is still falling down the leg due to not staying tight enough. pt able to increase reps with knee flexion and increase weighted with knee extension. pt able to complete step ups with cues for TKE.    Personal Factors and Comorbidities  Comorbidity 3+    Comorbidities  Arthritis, osteoporosis, R TKA, hx cancer, CKD, HTN    Stability/Clinical Decision Making  Stable/Uncomplicated    Rehab Potential  Good    PT Frequency  2x / week    PT Treatment/Interventions  ADLs/Self Care Home Management;Moist Heat;Electrical Stimulation;Cryotherapy;Gait training;Stair training;Functional mobility training;Therapeutic activities;Therapeutic exercise;Balance training;Neuromuscular re-education;Patient/family education;Manual techniques;Taping    PT Next Visit Plan  Continue with easy quad/hip and ankle stabilization exercises       Patient will benefit from skilled therapeutic intervention in order to improve the following deficits and impairments:  Decreased range of motion, Pain,  Decreased strength  Visit Diagnosis: Acute pain of left knee     Problem List Patient Active Problem List   Diagnosis Date Noted  . Acute pain of left knee 06/07/2019  . Prediabetes 05/10/2019  . Glaucoma of both eyes 05/10/2019  . Essential hypertension 05/10/2019  . Dry scalp 05/10/2019  . Age-related osteoporosis without current pathological fracture 05/10/2019  . Seborrheic keratosis 01/11/2019  . Rosacea 01/11/2019  . Family history of colon cancer 01/11/2019  . Edema 08/27/2018  . Primary osteoarthritis 08/27/2018  . Sciatica of right side 08/27/2018  . Depression, major, single episode, complete remission ([place]) 08/27/2018  . Vitamin D deficiency 08/27/2018  . Mixed hyperlipidemia 08/27/2018  . Anxiety 08/27/2018  . Hyperglycemia 08/27/2018  . HTN (hypertension), benign 08/27/2018    [name], [place] 07/01/2019, 1:47 PM  [facility] [name] [place], [name], [address] Phone: [phone]   Fax:  [phone]  Name: Kristi Hunter MRN: [id number] Date of Birth: 10/29/1940

## 2019-07-02 NOTE — Telephone Encounter (Signed)
[employer]/ Pt  is calling back in to follow up with [name] about pt's billing concern. They are stating that pt has a bill for 50.00 that has gone to collections. [facility] states that provider [name] is showing up as a specialist in their system as her "role", this should be updated with credentialing. Pt is upset that this is happening and would like further assistance with this.

## 2019-07-06 ENCOUNTER — Other Ambulatory Visit: Payer: Self-pay

## 2019-07-06 ENCOUNTER — Ambulatory Visit: Payer: Medicare HMO | Admitting: Physical Therapy

## 2019-07-06 DIAGNOSIS — R2689 Other abnormalities of gait and mobility: Secondary | ICD-10-CM | POA: Diagnosis not present

## 2019-07-06 DIAGNOSIS — M25562 Pain in left knee: Secondary | ICD-10-CM | POA: Diagnosis not present

## 2019-07-06 NOTE — Therapy (Signed)
[facility] [address] [place], [name], [address] Phone: [phone]   Fax:  [phone]  Physical Therapy Treatment  Patient Details  Name: Kristi Hunter MRN: [id number] Date of Birth: 20-Jul-1941 Referring Provider (PT): Clearance Coots MD   Encounter Date: 07/06/2019  PT End of Session - 07/06/19 1011    Visit Number  6    PT Start Time  0930    PT Stop Time  1010    PT Time Calculation (min)  40 min       Past Medical History:  Diagnosis Date  . Arthritis   . Cancer ([facility] [place])    uterine  . Chicken pox   . Chronic kidney disease   . Emphysema of lung ([place])   . Glaucoma   . Hypertension   . Urine incontinence   . UTI (urinary tract infection)     Past Surgical History:  Procedure Laterality Date  . ABDOMINAL HYSTERECTOMY  2011  . REPLACEMENT TOTAL KNEE Right 2012    There were no vitals filed for this visit.  Subjective Assessment - 07/06/19 0933    Subjective  "doing okay today"    Currently in Pain?  Yes    Pain Score  5     Pain Location  Knee    Pain Orientation  Left                       [facility] Adult PT Treatment/Exercise - 07/06/19 0001      Knee/Hip Exercises: Aerobic   Nustep  L 3 x 5 min      Knee/Hip Exercises: Machines for Strengthening   Cybex Knee Extension  2 x 15 10#    Cybex Knee Flexion  2 x 15 20#      Knee/Hip Exercises: Standing   Walking with Sports Cord  resisted gait fwd/bck and side steps 40# x 4      Knee/Hip Exercises: Seated   Sit to Sand  5 reps;with UE support             PT Education - 07/06/19 1016    Education Details  pt educated on using a cushion in chair to make it easier to get up.    Person(s) Educated  Patient    Methods  Explanation    Comprehension  Verbalized understanding       PT Short Term Goals - 06/25/19 1026      PT SHORT TERM GOAL #1   Title  Pt will be independent with initial HEP in order to  demonstrate autonomy of care.    Status  Achieved      PT SHORT TERM GOAL #2   Title  Pt will demonstrate 85 degrees L knee flexion and 0 degrees L knee extension in order to demonstrate improved functional ROM to decrease stress on surrounding musculature and to match the RLE.    Status  Achieved        PT Long Term Goals - 07/06/19 3329      PT LONG TERM GOAL #1   Title  Pt will demonstrate 4/5 strength Bil hip flexion/abd and 5/5 strength L knee extension in order to demonstrate improved functional strength.    Status  Partially Met      PT LONG TERM GOAL #2   Title  Pt will report pain 3/10 or less within 5 weeks in order to demonstrate a statistically significant decrease in pain  in the LLE to improve quality of life and decrease risk for immobility.    Status  Partially Met      PT LONG TERM GOAL #3   Title  Pt will ambulate with LRAD with fluid steps equal stride and step length BIL w/o hip hiking or antalgic gait within 6 weeks to decrease increased stress on contralateral LE.    Status  Partially Met            Plan - 07/06/19 1013    Clinical Impression Statement  pt arrived without her brace. she is no longer trying to wear it. pt able to perform resisted gait forward, back, and side stepping with min guarding. pt had LOB x 2 but was able to right herself. pt able to increase weight with knee flexion. pt complains of knee pain with side stepping.    Personal Factors and Comorbidities  Comorbidity 3+    Comorbidities  Arthritis, osteoporosis, R TKA, hx cancer, CKD, HTN    Stability/Clinical Decision Making  Stable/Uncomplicated    Rehab Potential  Good    PT Frequency  2x / week    PT Treatment/Interventions  ADLs/Self Care Home Management;Moist Heat;Electrical Stimulation;Cryotherapy;Gait training;Stair training;Functional mobility training;Therapeutic activities;Therapeutic exercise;Balance training;Neuromuscular re-education;Patient/family education;Manual  techniques;Taping    PT Next Visit Plan  Continue with easy quad/hip and ankle stabilization exercises       Patient will benefit from skilled therapeutic intervention in order to improve the following deficits and impairments:  Decreased range of motion, Pain, Decreased strength  Visit Diagnosis: Acute pain of left knee     Problem List Patient Active Problem List   Diagnosis Date Noted  . Acute pain of left knee 06/07/2019  . Prediabetes 05/10/2019  . Glaucoma of both eyes 05/10/2019  . Essential hypertension 05/10/2019  . Dry scalp 05/10/2019  . Age-related osteoporosis without current pathological fracture 05/10/2019  . Seborrheic keratosis 01/11/2019  . Rosacea 01/11/2019  . Family history of colon cancer 01/11/2019  . Edema 08/27/2018  . Primary osteoarthritis 08/27/2018  . Sciatica of right side 08/27/2018  . Depression, major, single episode, complete remission ([place]) 08/27/2018  . Vitamin D deficiency 08/27/2018  . Mixed hyperlipidemia 08/27/2018  . Anxiety 08/27/2018  . Hyperglycemia 08/27/2018  . HTN (hypertension), benign 08/27/2018    [name], [place] 07/06/2019, 10:17 AM  [address] [place], [name], [address] Phone: [phone]   Fax:  [phone]  Name: Kristi Hunter MRN: [id number] Date of Birth: [birth year]

## 2019-07-06 NOTE — Telephone Encounter (Signed)
Spoke with Pt. Pt bill Is now in collections.  Advise that all information was sent to credentialing and I will reach out to credentialing and billing dept. Will also make practice admin aware to see if we can get this resolved and recall from collections. After reaching out to billing director, pt bill will be recalled from collections and credentialing is working with [facility] [employer] to get the matter solved. Pt was updated and understands.

## 2019-07-13 ENCOUNTER — Ambulatory Visit: Payer: Medicare HMO | Attending: Family Medicine | Admitting: Physical Therapy

## 2019-07-13 ENCOUNTER — Other Ambulatory Visit: Payer: Self-pay

## 2019-07-13 DIAGNOSIS — R2689 Other abnormalities of gait and mobility: Secondary | ICD-10-CM | POA: Diagnosis not present

## 2019-07-13 DIAGNOSIS — M25562 Pain in left knee: Secondary | ICD-10-CM | POA: Diagnosis not present

## 2019-07-13 NOTE — Therapy (Signed)
[facility] [place], [name], [address] Phone: [phone]   Fax:  [phone]  Physical Therapy Treatment  Patient Details  Name: Kristi Hunter MRN: [id number] Date of Birth: 1941-06-20 Referring Provider (PT): Clearance Coots MD   Encounter Date: 07/13/2019  PT End of Session - 07/13/19 1110    Visit Number  7    PT Start Time  6378    PT Stop Time  1110    PT Time Calculation (min)  45 min       Past Medical History:  Diagnosis Date  . Arthritis   . Cancer ([place])    uterine  . Chicken pox   . Chronic kidney disease   . Emphysema of lung ([place])   . Glaucoma   . Hypertension   . Urine incontinence   . UTI (urinary tract infection)     Past Surgical History:  Procedure Laterality Date  . ABDOMINAL HYSTERECTOMY  2011  . REPLACEMENT TOTAL KNEE Right 2012    There were no vitals filed for this visit.  Subjective Assessment - 07/13/19 1029    Subjective  "feeling terrible". pt states she had pain after last session. pt states she has ordered a new knee brace.    Currently in Pain?  Yes    Pain Score  5     Pain Location  Knee    Pain Orientation  Left                       [facility] Adult PT Treatment/Exercise - 07/13/19 0001      High Level Balance   High Level Balance Activities  Marching forwards;Side stepping   w/ 3# weights     Knee/Hip Exercises: Aerobic   Nustep  L 5 x 6 min      Knee/Hip Exercises: Machines for Strengthening   Cybex Knee Extension  10# x 20    Cybex Knee Flexion  2 x 15 20#      Knee/Hip Exercises: Standing   Lateral Step Up  Step Height: 4";Left;20 reps;Hand Hold: 2    Forward Step Up  Left;Hand Hold: 2;Step Height: 4";20 reps    Step Down  5 reps;Left;Hand Hold: 2;Step Height: 4"    Walking with Sports Cord  resisted gait fwd/bck 40# x 4               PT Short Term Goals - 06/25/19 1026      PT SHORT TERM GOAL #1   Title  Pt  will be independent with initial HEP in order to demonstrate autonomy of care.    Status  Achieved      PT SHORT TERM GOAL #2   Title  Pt will demonstrate 85 degrees L knee flexion and 0 degrees L knee extension in order to demonstrate improved functional ROM to decrease stress on surrounding musculature and to match the RLE.    Status  Achieved        PT Long Term Goals - 07/06/19 5885      PT LONG TERM GOAL #1   Title  Pt will demonstrate 4/5 strength Bil hip flexion/abd and 5/5 strength L knee extension in order to demonstrate improved functional strength.    Status  Partially Met      PT LONG TERM GOAL #2   Title  Pt will report pain 3/10 or less within 5 weeks in order to demonstrate a statistically  significant decrease in pain in the LLE to improve quality of life and decrease risk for immobility.    Status  Partially Met      PT LONG TERM GOAL #3   Title  Pt will ambulate with LRAD with fluid steps equal stride and step length BIL w/o hip hiking or antalgic gait within 6 weeks to decrease increased stress on contralateral LE.    Status  Partially Met            Plan - 07/13/19 1111    Clinical Impression Statement  pt needs cues with step downs and lateral step ups. pt unable to complete all reps of the step downs due to pain and weakness of the left knee. pt able to march and side step w/ 3# weights  across the gym with HHA. pt performed resisted gait with CGA and no LOB. pt requires rest breaks secondary to fatigue and pain of left knee.    Personal Factors and Comorbidities  Comorbidity 3+    Comorbidities  Arthritis, osteoporosis, R TKA, hx cancer, CKD, HTN    Stability/Clinical Decision Making  Stable/Uncomplicated    Rehab Potential  Good    PT Frequency  2x / week    PT Treatment/Interventions  ADLs/Self Care Home Management;Moist Heat;Electrical Stimulation;Cryotherapy;Gait training;Stair training;Functional mobility training;Therapeutic activities;Therapeutic  exercise;Balance training;Neuromuscular re-education;Patient/family education;Manual techniques;Taping    PT Next Visit Plan  Continue with  quad/hip and ankle stabilization exercises       Patient will benefit from skilled therapeutic intervention in order to improve the following deficits and impairments:  Decreased range of motion, Pain, Decreased strength  Visit Diagnosis: Acute pain of left knee     Problem List Patient Active Problem List   Diagnosis Date Noted  . Acute pain of left knee 06/07/2019  . Prediabetes 05/10/2019  . Glaucoma of both eyes 05/10/2019  . Essential hypertension 05/10/2019  . Dry scalp 05/10/2019  . Age-related osteoporosis without current pathological fracture 05/10/2019  . Seborrheic keratosis 01/11/2019  . Rosacea 01/11/2019  . Family history of colon cancer 01/11/2019  . Edema 08/27/2018  . Primary osteoarthritis 08/27/2018  . Sciatica of right side 08/27/2018  . Depression, major, single episode, complete remission ([place]) 08/27/2018  . Vitamin D deficiency 08/27/2018  . Mixed hyperlipidemia 08/27/2018  . Anxiety 08/27/2018  . Hyperglycemia 08/27/2018  . HTN (hypertension), benign 08/27/2018    [name], SPTA 07/13/2019, 11:17 AM  [place], [name], [address] Phone: [phone]   Fax:  [phone]  Name: Kristi Hunter MRN: [id number] Date of Birth: February 12, 1941

## 2019-07-15 ENCOUNTER — Other Ambulatory Visit: Payer: Self-pay

## 2019-07-15 ENCOUNTER — Ambulatory Visit: Payer: Medicare HMO | Admitting: Physical Therapy

## 2019-07-15 DIAGNOSIS — R2689 Other abnormalities of gait and mobility: Secondary | ICD-10-CM | POA: Diagnosis not present

## 2019-07-15 DIAGNOSIS — M25562 Pain in left knee: Secondary | ICD-10-CM

## 2019-07-15 NOTE — Therapy (Signed)
[facility], [name], [address] Phone: [phone]   Fax:  [phone]  Physical Therapy Treatment  Patient Details  Name: Kristi Hunter MRN: [id number] Date of Birth: 07-16-1941 Referring Provider (PT): Clearance Coots MD   Encounter Date: 07/15/2019  PT End of Session - 07/15/19 1056    Visit Number  8    Date for PT Re-Evaluation  07/29/19    PT Start Time  9528    PT Stop Time  1100    PT Time Calculation (min)  45 min    Activity Tolerance  Patient tolerated treatment well       Past Medical History:  Diagnosis Date  . Arthritis   . Cancer ([place])    uterine  . Chicken pox   . Chronic kidney disease   . Emphysema of lung ([place])   . Glaucoma   . Hypertension   . Urine incontinence   . UTI (urinary tract infection)     Past Surgical History:  Procedure Laterality Date  . ABDOMINAL HYSTERECTOMY  2011  . REPLACEMENT TOTAL KNEE Right 2012    There were no vitals filed for this visit.  Subjective Assessment - 07/15/19 1049    Subjective  "feeling pretty good"    Currently in Pain?  Yes    Pain Score  6     Pain Location  Knee    Pain Orientation  Left                       [facility] Adult PT Treatment/Exercise - 07/15/19 0001      Knee/Hip Exercises: Aerobic   Nustep  L 5 x 5 min      Knee/Hip Exercises: Seated   Long Arc Quad  Strengthening;Left;15 reps;2 sets    [employer] Weight  3 lbs.    Heel Slides  Strengthening;Right;20 reps    Hamstring Curl  Left;Strengthening;2 sets;15 reps    Hamstring Limitations  green theraband    Abduction/Adduction   Strengthening;Both;1 set;20 reps;10 reps    Abd/Adduction Limitations  hip add w/ ball and hip abd w/ green theraband      Knee/Hip Exercises: Supine   Straight Leg Raises  Strengthening;Left;10 reps               PT Short Term Goals - 06/25/19 1026      PT SHORT TERM GOAL #1   Title  Pt will  be independent with initial HEP in order to demonstrate autonomy of care.    Status  Achieved      PT SHORT TERM GOAL #2   Title  Pt will demonstrate 85 degrees L knee flexion and 0 degrees L knee extension in order to demonstrate improved functional ROM to decrease stress on surrounding musculature and to match the RLE.    Status  Achieved        PT Long Term Goals - 07/15/19 1048      PT LONG TERM GOAL #1   Title  Pt will demonstrate 4/5 strength Bil hip flexion/abd and 5/5 strength L knee extension in order to demonstrate improved functional strength.    Status  Partially Met      PT LONG TERM GOAL #2   Title  Pt will report pain 3/10 or less within 5 weeks in order to demonstrate a statistically significant decrease in pain in the LLE to improve quality of life and decrease  risk for immobility.    Status  Partially Met      PT LONG TERM GOAL #3   Title  Pt will ambulate with LRAD with fluid steps equal stride and step length BIL w/o hip hiking or antalgic gait within 6 weeks to decrease increased stress on contralateral LE.    Status  Achieved            Plan - 07/15/19 1106    Clinical Impression Statement  pt arrived with knee brace and new wraps. pt stated doctor wanted her to have the brace on with exercise. pt was assisted with donning brace and wraps. exercises were scaled back due to complaints on pain during last session. pt had limited ROM with knee brace on. pt attempted to perform supine exercises, but was unable to due to trouble breathing. pt was able to progress to green theraband with hip abduction and HS curls.    Personal Factors and Comorbidities  Comorbidity 3+    Comorbidities  Arthritis, osteoporosis, R TKA, hx cancer, CKD, HTN    Stability/Clinical Decision Making  Stable/Uncomplicated    Rehab Potential  Good    PT Frequency  2x / week    PT Treatment/Interventions  ADLs/Self Care Home Management;Moist Heat;Electrical Stimulation;Cryotherapy;Gait  training;Stair training;Functional mobility training;Therapeutic activities;Therapeutic exercise;Balance training;Neuromuscular re-education;Patient/family education;Manual techniques;Taping    PT Next Visit Plan  Continue with  quad/hip and ankle stabilization exercises       Patient will benefit from skilled therapeutic intervention in order to improve the following deficits and impairments:  Decreased range of motion, Pain, Decreased strength  Visit Diagnosis: Acute pain of left knee     Problem List Patient Active Problem List   Diagnosis Date Noted  . Acute pain of left knee 06/07/2019  . Prediabetes 05/10/2019  . Glaucoma of both eyes 05/10/2019  . Essential hypertension 05/10/2019  . Dry scalp 05/10/2019  . Age-related osteoporosis without current pathological fracture 05/10/2019  . Seborrheic keratosis 01/11/2019  . Rosacea 01/11/2019  . Family history of colon cancer 01/11/2019  . Edema 08/27/2018  . Primary osteoarthritis 08/27/2018  . Sciatica of right side 08/27/2018  . Depression, major, single episode, complete remission ([place]) 08/27/2018  . Vitamin D deficiency 08/27/2018  . Mixed hyperlipidemia 08/27/2018  . Anxiety 08/27/2018  . Hyperglycemia 08/27/2018  . HTN (hypertension), benign 08/27/2018    [name], SPTA 07/15/2019, 11:17 AM  [place], [name], [address] Phone: [phone]   Fax:  [phone]  Name: Scotland Dost MRN: [id number] Date of Birth: 03-02-1941

## 2019-07-20 ENCOUNTER — Other Ambulatory Visit: Payer: Self-pay

## 2019-07-20 ENCOUNTER — Ambulatory Visit: Payer: Medicare HMO | Admitting: Physical Therapy

## 2019-07-20 DIAGNOSIS — M25562 Pain in left knee: Secondary | ICD-10-CM

## 2019-07-20 DIAGNOSIS — R2689 Other abnormalities of gait and mobility: Secondary | ICD-10-CM | POA: Diagnosis not present

## 2019-07-20 NOTE — Therapy (Signed)
[facility], [name], [address] Phone: [phone]   Fax:  [phone]  Physical Therapy Treatment  Patient Details  Name: Kristi Hunter MRN: [id number] Date of Birth: November 28, 1940 Referring Provider (PT): Clearance Coots MD   Encounter Date: 07/20/2019  PT End of Session - 07/20/19 1017    Visit Number  9    Number of Visits  11    Date for PT Re-Evaluation  07/29/19    PT Start Time  0930    PT Stop Time  6256    PT Time Calculation (min)  44 min       Past Medical History:  Diagnosis Date  . Arthritis   . Cancer ([place])    uterine  . Chicken pox   . Chronic kidney disease   . Emphysema of lung ([place])   . Glaucoma   . Hypertension   . Urine incontinence   . UTI (urinary tract infection)     Past Surgical History:  Procedure Laterality Date  . ABDOMINAL HYSTERECTOMY  2011  . REPLACEMENT TOTAL KNEE Right 2012    There were no vitals filed for this visit.  Subjective Assessment - 07/20/19 0939    Subjective  pt states her knee begins to hurt with prolonged standing    Patient Stated Goals  I want to walk normal and not have to use a cane or a walker.    Currently in Pain?  Yes    Pain Score  3     Pain Location  Knee    Pain Orientation  Left                       [facility] Adult PT Treatment/Exercise - 07/20/19 0001      Knee/Hip Exercises: Aerobic   Nustep  L6 x 104mn      Knee/Hip Exercises: Machines for Strengthening   Cybex Knee Extension  10#  2 x 15    Cybex Knee Flexion  2 x 15 20#      Knee/Hip Exercises: Seated   Long Arc Quad  Strengthening;Left;2 sets;10 reps;20 reps;Weights    Long Arc Quad Weight  3 lbs.    Ball Squeeze  2 x 10    Other Seated Knee/Hip Exercises  marching 3#, 2 x 10    Sit to Sand  10 reps;without UE support   w/ weighted ball              PT Short Term Goals - 06/25/19 1026      PT SHORT TERM GOAL #1   Title  Pt  will be independent with initial HEP in order to demonstrate autonomy of care.    Status  Achieved      PT SHORT TERM GOAL #2   Title  Pt will demonstrate 85 degrees L knee flexion and 0 degrees L knee extension in order to demonstrate improved functional ROM to decrease stress on surrounding musculature and to match the RLE.    Status  Achieved        PT Long Term Goals - 07/15/19 1048      PT LONG TERM GOAL #1   Title  Pt will demonstrate 4/5 strength Bil hip flexion/abd and 5/5 strength L knee extension in order to demonstrate improved functional strength.    Status  Partially Met      PT LONG TERM GOAL #2   Title  Pt will  report pain 3/10 or less within 5 weeks in order to demonstrate a statistically significant decrease in pain in the LLE to improve quality of life and decrease risk for immobility.    Status  Partially Met      PT LONG TERM GOAL #3   Title  Pt will ambulate with LRAD with fluid steps equal stride and step length BIL w/o hip hiking or antalgic gait within 6 weeks to decrease increased stress on contralateral LE.    Status  Achieved            Plan - 07/20/19 1018    Clinical Impression Statement  pt came in with her brace and wraps. pt was assisted with donning brace. after warming up the brace began to slip and it was taken off. brace was redonned at the end of session when to pt left. pt continues to work on Librarian, academic the left knee. exercises were done it sitting due to pain complaint of pain while standing. pt able to complete STS with weighted ball. pt states that she has pain in the anterior knee with knee extension. pt needs cues for redirection as she is highly distractible.    Personal Factors and Comorbidities  Comorbidity 3+    Comorbidities  Arthritis, osteoporosis, R TKA, hx cancer, CKD, HTN    Stability/Clinical Decision Making  Stable/Uncomplicated    Rehab Potential  Good    PT Frequency  2x / week    PT Treatment/Interventions  ADLs/Self  Care Home Management;Moist Heat;Electrical Stimulation;Cryotherapy;Gait training;Stair training;Functional mobility training;Therapeutic activities;Therapeutic exercise;Balance training;Neuromuscular re-education;Patient/family education;Manual techniques;Taping    PT Next Visit Plan  Continue with  quad/hip and ankle stabilization exercises    Consulted and Agree with Plan of Care  Patient       Patient will benefit from skilled therapeutic intervention in order to improve the following deficits and impairments:  Decreased range of motion, Pain, Decreased strength  Visit Diagnosis: Acute pain of left knee     Problem List Patient Active Problem List   Diagnosis Date Noted  . Acute pain of left knee 06/07/2019  . Prediabetes 05/10/2019  . Glaucoma of both eyes 05/10/2019  . Essential hypertension 05/10/2019  . Dry scalp 05/10/2019  . Age-related osteoporosis without current pathological fracture 05/10/2019  . Seborrheic keratosis 01/11/2019  . Rosacea 01/11/2019  . Family history of colon cancer 01/11/2019  . Edema 08/27/2018  . Primary osteoarthritis 08/27/2018  . Sciatica of right side 08/27/2018  . Depression, major, single episode, complete remission ([place]) 08/27/2018  . Vitamin D deficiency 08/27/2018  . Mixed hyperlipidemia 08/27/2018  . Anxiety 08/27/2018  . Hyperglycemia 08/27/2018  . HTN (hypertension), benign 08/27/2018    [name], [place] 07/20/2019, 10:25 AM  [place], [name], [address] Phone: [phone]   Fax:  [phone]  Name: Kristi Hunter MRN: [id number] Date of Birth: 1941/04/19

## 2019-07-23 ENCOUNTER — Other Ambulatory Visit: Payer: Self-pay

## 2019-07-23 ENCOUNTER — Ambulatory Visit: Payer: Medicare HMO | Admitting: Physical Therapy

## 2019-07-23 DIAGNOSIS — R2689 Other abnormalities of gait and mobility: Secondary | ICD-10-CM | POA: Diagnosis not present

## 2019-07-23 DIAGNOSIS — M25562 Pain in left knee: Secondary | ICD-10-CM | POA: Diagnosis not present

## 2019-07-23 NOTE — Therapy (Signed)
[facility] [place], [name], [address] Phone: [phone]   Fax:  [phone] Progress Note Reporting Period 06/17/19 to 07/23/19 for the first 10 visits  See note below for Objective Data and Assessment of Progress/Goals.      Physical Therapy Treatment  Patient Details  Name: Kristi Hunter MRN: [id number] Date of Birth: [birth year] Referring Provider (PT): Clearance Coots MD   Encounter Date: 07/23/2019  PT End of Session - 07/23/19 1024    Visit Number  10    Date for PT Re-Evaluation  07/29/19    PT Start Time  1010    PT Stop Time  1100    PT Time Calculation (min)  50 min       Past Medical History:  Diagnosis Date  . Arthritis   . Cancer ([place])    uterine  . Chicken pox   . Chronic kidney disease   . Emphysema of lung ([place])   . Glaucoma   . Hypertension   . Urine incontinence   . UTI (urinary tract infection)     Past Surgical History:  Procedure Laterality Date  . ABDOMINAL HYSTERECTOMY  2011  . REPLACEMENT TOTAL KNEE Right 2012    There were no vitals filed for this visit.  Subjective Assessment - 07/23/19 1012    Subjective  better today, walking is getting better and sometimes up and down. inconsistant now vs constant    Currently in Pain?  Yes    Pain Score  1     Pain Location  Knee    Pain Orientation  Left                       [facility] Adult PT Treatment/Exercise - 07/23/19 0001      Knee/Hip Exercises: Aerobic   Nustep  L6 x 66mn      Knee/Hip Exercises: Machines for Strengthening   Cybex Knee Extension  15# 2 sets 10    Cybex Knee Flexion  25# 2 sets 15      Knee/Hip Exercises: Standing   Heel Raises  Both;2 sets;15 reps   black bar   Other Standing Knee Exercises  hip 3 way 10 each on airex with UE support      Knee/Hip Exercises: Seated   Long Arc Quad  Strengthening;Left;2 sets;Weights;15 reps   3   Long Arc Quad Weight  3 lbs.    Ball Squeeze  2 x 10    Clamshell with TheraBand  Blue   20   Marching  Strengthening;Both;15 reps   blue tband   Hamstring Curl  Left;Strengthening;2 sets;15 reps   blue tband   Abduction/Adduction   Strengthening;Left;2 sets;10 reps;Weights   step out up onto 6in box   Abd/Adduction Weights  3 lbs.    Sit to Sand  10 reps;without UE support   wt ball            PT Education - 07/23/19 1022    Education Details  pt still unable to donn brace independantly. uses PT session to get brace on and family other days but still c/u slipping/loosening    Person(s) Educated  Patient       PT Short Term Goals - 06/25/19 1026      PT SHORT TERM GOAL #1   Title  Pt will be independent with initial HEP in order to demonstrate autonomy of care.    Status  Achieved  PT SHORT TERM GOAL #2   Title  Pt will demonstrate 85 degrees L knee flexion and 0 degrees L knee extension in order to demonstrate improved functional ROM to decrease stress on surrounding musculature and to match the RLE.    Status  Achieved        PT Long Term Goals - 07/23/19 1021      PT LONG TERM GOAL #1   Title  Pt will demonstrate 4/5 strength Bil hip flexion/abd and 5/5 strength L knee extension in order to demonstrate improved functional strength.    Baseline  Bil hip flexion/abd 4/5, L knee extension 4/5    Status  Partially Met      PT LONG TERM GOAL #2   Title  Pt will report pain 3/10 or less within 5 weeks in order to demonstrate a statistically significant decrease in pain in the LLE to improve quality of life and decrease risk for immobility.    Status  Partially Met      PT LONG TERM GOAL #3   Title  Pt will ambulate with LRAD with fluid steps equal stride and step length BIL w/o hip hiking or antalgic gait within 6 weeks to decrease increased stress on contralateral LE.    Status  Achieved            Plan - 07/23/19 1023    Clinical Impression Statement  3 days a week 50-75%, other  days about 25%- good and bad days, inconsistant pain vs constant. pt progressing towards goals. cued with ther ex for good muscle activation    PT Treatment/Interventions  ADLs/Self Care Home Management;Moist Heat;Electrical Stimulation;Cryotherapy;Gait training;Stair training;Functional mobility training;Therapeutic activities;Therapeutic exercise;Balance training;Neuromuscular re-education;Patient/family education;Manual techniques;Taping    PT Next Visit Plan  LE strengthening esp Left       Patient will benefit from skilled therapeutic intervention in order to improve the following deficits and impairments:  Decreased range of motion, Pain, Decreased strength  Visit Diagnosis: Other abnormalities of gait and mobility  Acute pain of left knee     Problem List Patient Active Problem List   Diagnosis Date Noted  . Acute pain of left knee 06/07/2019  . Prediabetes 05/10/2019  . Glaucoma of both eyes 05/10/2019  . Essential hypertension 05/10/2019  . Dry scalp 05/10/2019  . Age-related osteoporosis without current pathological fracture 05/10/2019  . Seborrheic keratosis 01/11/2019  . Rosacea 01/11/2019  . Family history of colon cancer 01/11/2019  . Edema 08/27/2018  . Primary osteoarthritis 08/27/2018  . Sciatica of right side 08/27/2018  . Depression, major, single episode, complete remission ([place]) 08/27/2018  . Vitamin D deficiency 08/27/2018  . Mixed hyperlipidemia 08/27/2018  . Anxiety 08/27/2018  . Hyperglycemia 08/27/2018  . HTN (hypertension), benign 08/27/2018    Rosabelle Jupin,ANGIE PTA 07/23/2019, 10:51 AM  [place], [name], [address] Phone: [phone]   Fax:  [phone]  Name: Tyasia Packard MRN: [id number] Date of Birth: 1941-01-17

## 2019-07-27 ENCOUNTER — Other Ambulatory Visit: Payer: Self-pay

## 2019-07-27 ENCOUNTER — Ambulatory Visit: Payer: Medicare HMO | Admitting: Physical Therapy

## 2019-07-27 DIAGNOSIS — M25562 Pain in left knee: Secondary | ICD-10-CM | POA: Diagnosis not present

## 2019-07-27 DIAGNOSIS — R2689 Other abnormalities of gait and mobility: Secondary | ICD-10-CM | POA: Diagnosis not present

## 2019-07-27 NOTE — Progress Notes (Addendum)
Virtual Visit via Video Note  I connected with patient on 07/28/19 at  8:45 AM EST by audio enabled telemedicine application and verified that I am speaking with the correct person using two identifiers.   THIS ENCOUNTER IS A VIRTUAL VISIT DUE TO COVID-19 - PATIENT WAS NOT SEEN IN THE OFFICE. PATIENT HAS CONSENTED TO VIRTUAL VISIT / TELEMEDICINE VISIT   Location of patient: home  Location of provider: office  I discussed the limitations of evaluation and management by telemedicine and the availability of in person appointments. The patient expressed understanding and agreed to proceed.   Subjective:   Kristi Hunter is a 78 y.o. female who presents for Medicare Annual/Subsequent preventive examination.  Review of Systems:    Home Safety/Smoke Alarms: Feels safe in home. Smoke alarms in place.  Lives alone in story home.   Female:    Mammo- 04/29/19      Dexa scan-  01/24/19     CCS- Cologuard 02/09/19-negative     Objective:    Vitals: Unable to assess. This visit is enabled though telemedicine due to Covid 19.  Advanced Directives 07/28/2019  Does Patient Have a Medical Advance Directive? Yes  Type of Paramedic of [place];Living will  Does patient want to make changes to medical advance directive? No - Patient declined  Copy of [place] in Chart? No - copy requested    Tobacco Social History   Tobacco Use  Smoking Status Former Smoker  . Types: Cigarettes  . Quit date: 08/12/1982  . Years since quitting: 36.9  Smokeless Tobacco Never Used     Counseling given: Not Answered   Clinical Intake: Pain : No/denies pain     Past Medical History:  Diagnosis Date  . Arthritis   . Cancer ([place])    uterine  . Chicken pox   . Chronic kidney disease   . Emphysema of lung ([place])   . Glaucoma   . Hypertension   . Urine incontinence   . UTI (urinary tract infection)    Past Surgical History:  Procedure Laterality Date  .  ABDOMINAL HYSTERECTOMY  2011  . REPLACEMENT TOTAL KNEE Right 2012   Family History  Problem Relation Age of Onset  . Cancer Father        skin  . Alcohol abuse Father   . Asthma Father   . COPD Father   . Heart attack Father   . Heart disease Father   . Cancer Maternal Grandfather 45       colon cancer  . Heart attack Mother   . Heart disease Mother   . Hyperlipidemia Mother   . Hypertension Mother   . Stroke Mother   . Miscarriages / [name] Mother   . Alcohol abuse Brother   . Arthritis Brother   . Drug abuse Brother   . Depression Daughter   . Hypertension Daughter   . Mental illness Daughter   . Depression Son   . Arthritis Maternal Grandmother   . Diabetes Maternal Grandmother   . Hyperlipidemia Maternal Grandmother   . Alcohol abuse Paternal Grandfather   . Arthritis Paternal Grandfather   . Cirrhosis Paternal Grandfather   . Alcohol abuse Brother   . Breast cancer Paternal Aunt    Social History   Socioeconomic History  . Marital status: Widowed    Spouse name: Not on file  . Number of children: 3  . Years of education: Not on file  . Highest education level:  Not on file  Occupational History    Comment: Retired Engineer, maintenance (IT)  Tobacco Use  . Smoking status: Former Smoker    Types: Cigarettes    Quit date: 08/12/1982    Years since quitting: 36.9  . Smokeless tobacco: Never Used  Substance and Sexual Activity  . Alcohol use: Yes    Alcohol/week: 1.0 standard drinks    Types: 1 Glasses of wine per week    Comment: every 1-2 wks.   . Drug use: Never  . Sexual activity: Not on file  Other Topics Concern  . Not on file  Social History Narrative  . Not on file   Social Determinants of Health   Financial Resource Strain:   . Difficulty of Paying Living Expenses: Not on file  Food Insecurity:   . Worried About Charity fundraiser in the Last Year: Not on file  . Ran Out of Food in the Last Year: Not on file  Transportation Needs:   . Lack of  Transportation (Medical): Not on file  . Lack of Transportation (Non-Medical): Not on file  Physical Activity:   . Days of Exercise per Week: Not on file  . Minutes of Exercise per Session: Not on file  Stress:   . Feeling of Stress : Not on file  Social Connections:   . Frequency of Communication with Friends and Family: Not on file  . Frequency of Social Gatherings with Friends and Family: Not on file  . Attends Religious Services: Not on file  . Active Member of Clubs or Organizations: Not on file  . Attends Archivist Meetings: Not on file  . Marital Status: Not on file    Outpatient Encounter Medications as of 07/28/2019  Medication Sig  . aspirin 81 MG chewable tablet Chew by mouth daily.  . Calcium Carb-Cholecalciferol (CALCIUM 600 + D PO) Take by mouth.  . chlorthalidone (HYGROTON) 25 MG tablet Take 1 tablet (25 mg total) by mouth daily.  . Cholecalciferol (VITAMIN D3 PO) Take 50 mcg by mouth.  Marland Kitchen GLUCOSAMINE-CHONDROITIN-VIT D3 PO Take 2,000 Units by mouth.  . Melatonin 10 MG TABS Take by mouth.  . metroNIDAZOLE (METROGEL) 0.75 % gel Apply 1 application topically 2 (two) times daily.  . Multiple Vitamin (MULTIVITAMIN) tablet Take 1 tablet by mouth daily.  . naproxen sodium (ALEVE) 220 MG tablet Take 220 mg by mouth.  Marland Kitchen ketoconazole (NIZORAL) 2 % shampoo Apply 1 application topically 2 (two) times a week.   No facility-administered encounter medications on file as of 07/28/2019.    Activities of Daily Living In your present state of health, do you have any difficulty performing the following activities: 07/28/2019  Hearing? N  Vision? N  Difficulty concentrating or making decisions? N  Walking or climbing stairs? N  Dressing or bathing? N  Doing errands, shopping? N  Preparing Food and eating ? N  Using the Toilet? N  In the past six months, have you accidently leaked urine? N  Do you have problems with loss of bowel control? N  Managing your Medications? N   Managing your Finances? N  Housekeeping or managing your Housekeeping? N  Some recent data might be hidden    Patient Care Team: Libby Maw, MD as PCP - General (Family Medicine)   Assessment:   This is a routine wellness examination for Kristi Hunter. Physical assessment deferred to PCP.  Exercise Activities and Dietary recommendations Current Exercise Habits: The patient does not participate in regular exercise at  present, Exercise limited by: orthopedic condition(s)(knee pain) Diet (meal preparation, eat out, water intake, caffeinated beverages, dairy products, fruits and vegetables): well balanced   Goals    . Patient Stated     Increase physical activity once knee heals.        Fall Risk Fall Risk  07/28/2019 08/14/2018  Falls in the past year? 0 0  Follow up Education provided;Falls prevention discussed -     Depression Screen PHQ 2/9 Scores 07/28/2019 08/14/2018  PHQ - 2 Score 0 0    Cognitive Function Ad8 score reviewed for issues:  Issues making decisions:no  Less interest in hobbies / activities:no  Repeats questions, stories (family complaining):no  Trouble using ordinary gadgets (microwave, computer, phone):no  Forgets the month or year: no  Mismanaging finances: no  Remembering appts:no  Daily problems with thinking and/or memory:no Ad8 score is=0        Immunization History  Administered Date(s) Administered  . Fluad Quad(high Dose 65+) 05/25/2019  . Influenza,inj,Quad PF,6+ Mos 05/12/2018    Screening Tests Health Maintenance  Topic Date Due  . TETANUS/TDAP  08/04/1962  . PNA vac Low Risk Adult (1 of 2 - PCV13) 08/04/2008  . COLONOSCOPY  02/08/2029  . INFLUENZA VACCINE  Completed  . DEXA SCAN  Completed       Plan:   See you next year!  Continue to eat heart healthy diet (full of fruits, vegetables, whole grains, lean protein, water--limit salt, fat, and sugar intake) and increase physical activity as tolerated.   Continue doing brain stimulating activities (puzzles, reading, adult coloring books, staying active) to keep memory sharp.   Bring a copy of your living will and/or healthcare power of attorney to your next office visit.    I have personally reviewed and noted the following in the patient's chart:   . Medical and social history . Use of alcohol, tobacco or illicit drugs  . Current medications and supplements . Functional ability and status . Nutritional status . Physical activity . Advanced directives . List of other physicians . Hospitalizations, surgeries, and ER visits in previous 12 months . Vitals . Screenings to include cognitive, depression, and falls . Referrals and appointments  In addition, I have reviewed and discussed with patient certain preventive protocols, quality metrics, and best practice recommendations. A written personalized care plan for preventive services as well as general preventive health recommendations were provided to patient.     Shela Nevin, [place]  07/28/2019  Reviewed and agree.

## 2019-07-27 NOTE — Therapy (Signed)
[facility] [place], [name], [address] Phone: [phone]   Fax:  [phone]  Physical Therapy Treatment  Patient Details  Name: Kristi Hunter MRN: [id number] Date of Birth: 20-Jul-1941 Referring Provider (PT): Clearance Coots MD   Encounter Date: 07/27/2019  PT End of Session - 07/27/19 1101    Visit Number  11    Number of Visits  11    Date for PT Re-Evaluation  07/29/19    PT Start Time  4081    PT Stop Time  1100    PT Time Calculation (min)  45 min       Past Medical History:  Diagnosis Date  . Arthritis   . Cancer ([place])    uterine  . Chicken pox   . Chronic kidney disease   . Emphysema of lung ([place])   . Glaucoma   . Hypertension   . Urine incontinence   . UTI (urinary tract infection)     Past Surgical History:  Procedure Laterality Date  . ABDOMINAL HYSTERECTOMY  2011  . REPLACEMENT TOTAL KNEE Right 2012    There were no vitals filed for this visit.  Subjective Assessment - 07/27/19 1017    Subjective  "both knees are hurting today"    Currently in Pain?  Yes    Pain Score  3     Pain Location  Knee    Pain Orientation  Left                       [facility] Adult PT Treatment/Exercise - 07/27/19 0001      Knee/Hip Exercises: Aerobic   Nustep  L5 x 29mn      Knee/Hip Exercises: Machines for Strengthening   Cybex Knee Extension  15# 2 sets 10    Cybex Knee Flexion  35# 2 sets 15      Knee/Hip Exercises: Standing   Walking with Sports Cord  resisted gait fwd/bck 30# x 3      Knee/Hip Exercises: Seated   Long Arc Quad  Strengthening;Left;2 sets;Weights;15 reps    Long Arc Quad Weight  3 lbs.    Ball Squeeze  3 x 10    Other Seated Knee/Hip Exercises  sit on box both, 3# x 10, fitter extension x 10               PT Short Term Goals - 06/25/19 1026      PT SHORT TERM GOAL #1   Title  Pt will be independent with initial HEP in order to  demonstrate autonomy of care.    Status  Achieved      PT SHORT TERM GOAL #2   Title  Pt will demonstrate 85 degrees L knee flexion and 0 degrees L knee extension in order to demonstrate improved functional ROM to decrease stress on surrounding musculature and to match the RLE.    Status  Achieved        PT Long Term Goals - 07/23/19 1021      PT LONG TERM GOAL #1   Title  Pt will demonstrate 4/5 strength Bil hip flexion/abd and 5/5 strength L knee extension in order to demonstrate improved functional strength.    Baseline  Bil hip flexion/abd 4/5, L knee extension 4/5    Status  Partially Met      PT LONG TERM GOAL #2   Title  Pt will report pain 3/10 or  less within 5 weeks in order to demonstrate a statistically significant decrease in pain in the LLE to improve quality of life and decrease risk for immobility.    Status  Partially Met      PT LONG TERM GOAL #3   Title  Pt will ambulate with LRAD with fluid steps equal stride and step length BIL w/o hip hiking or antalgic gait within 6 weeks to decrease increased stress on contralateral LE.    Status  Achieved            Plan - 07/27/19 1109    Clinical Impression Statement  pt was assisted with donning her brace. brace stayed on for the entire treatment today. pt attempted to use the fitter, but it caused too much pain in the left knee and the exercise was stopped. pt able to increase weight with knee flexion. pt able to maintain balance with resisted gait with HHA. weight was reduced for resisted gait. pt needs cues to improve form and technique of exercises. pt to be recerted    Personal Factors and Comorbidities  Comorbidity 3+    Comorbidities  Arthritis, osteoporosis, R TKA, hx cancer, CKD, HTN    Stability/Clinical Decision Making  Stable/Uncomplicated    Rehab Potential  Good    PT Frequency  2x / week    PT Treatment/Interventions  ADLs/Self Care Home Management;Moist Heat;Electrical Stimulation;Cryotherapy;Gait  training;Stair training;Functional mobility training;Therapeutic activities;Therapeutic exercise;Balance training;Neuromuscular re-education;Patient/family education;Manual techniques;Taping    PT Next Visit Plan  LE strengthening esp Left, recert    Consulted and Agree with Plan of Care  Patient       Patient will benefit from skilled therapeutic intervention in order to improve the following deficits and impairments:  Decreased range of motion, Pain, Decreased strength  Visit Diagnosis: Acute pain of left knee  Other abnormalities of gait and mobility     Problem List Patient Active Problem List   Diagnosis Date Noted  . Acute pain of left knee 06/07/2019  . Prediabetes 05/10/2019  . Glaucoma of both eyes 05/10/2019  . Essential hypertension 05/10/2019  . Dry scalp 05/10/2019  . Age-related osteoporosis without current pathological fracture 05/10/2019  . Seborrheic keratosis 01/11/2019  . Rosacea 01/11/2019  . Family history of colon cancer 01/11/2019  . Edema 08/27/2018  . Primary osteoarthritis 08/27/2018  . Sciatica of right side 08/27/2018  . Depression, major, single episode, complete remission ([place]) 08/27/2018  . Vitamin D deficiency 08/27/2018  . Mixed hyperlipidemia 08/27/2018  . Anxiety 08/27/2018  . Hyperglycemia 08/27/2018  . HTN (hypertension), benign 08/27/2018    [name], [place] 07/27/2019, 11:16 AM  [place], [name], [address] Phone: [phone]   Fax:  [phone]  Name: Kristi Hunter MRN: [id number] Date of Birth: 17-Mar-1941

## 2019-07-28 ENCOUNTER — Ambulatory Visit (INDEPENDENT_AMBULATORY_CARE_PROVIDER_SITE_OTHER): Payer: Medicare HMO | Admitting: *Deleted

## 2019-07-28 ENCOUNTER — Encounter: Payer: Self-pay | Admitting: *Deleted

## 2019-07-28 DIAGNOSIS — Z0000 Encounter for general adult medical examination without abnormal findings: Secondary | ICD-10-CM | POA: Diagnosis not present

## 2019-07-28 NOTE — Patient Instructions (Signed)
See you next year!  Continue to eat heart healthy diet (full of fruits, vegetables, whole grains, lean protein, water--limit salt, fat, and sugar intake) and increase physical activity as tolerated.  Continue doing brain stimulating activities (puzzles, reading, adult coloring books, staying active) to keep memory sharp.   Bring a copy of your living will and/or healthcare power of attorney to your next office visit.   Kristi Hunter , Thank you for taking time to come for your Medicare Wellness Visit. I appreciate your ongoing commitment to your health goals. Please review the following plan we discussed and let me know if I can assist you in the future.   These are the goals we discussed: Goals    . Patient Stated     Increase physical activity once knee heals.        This is a list of the screening recommended for you and due dates:  Health Maintenance  Topic Date Due  . Tetanus Vaccine  08/04/1962  . Pneumonia vaccines (1 of 2 - PCV13) 08/04/2008  . Colon Cancer Screening  02/08/2029  . Flu Shot  Completed  . DEXA scan (bone density measurement)  Completed    Preventive Care 65 Years and Older, Female Preventive care refers to lifestyle choices and visits with your health care provider that can promote health and wellness. This includes:  A yearly physical exam. This is also called an annual well check.  Regular dental and eye exams.  Immunizations.  Screening for certain conditions.  Healthy lifestyle choices, such as diet and exercise. What can I expect for my preventive care visit? Physical exam Your health care provider will check:  Height and weight. These may be used to calculate body mass index (BMI), which is a measurement that tells if you are at a healthy weight.  Heart rate and blood pressure.  Your skin for abnormal spots. Counseling Your health care provider may ask you questions about:  Alcohol, tobacco, and drug use.  Emotional  well-being.  Home and relationship well-being.  Sexual activity.  Eating habits.  History of falls.  Memory and ability to understand (cognition).  Work and work Statistician.  Pregnancy and menstrual history. What immunizations do I need?  Influenza (flu) vaccine  This is recommended every year. Tetanus, diphtheria, and pertussis (Tdap) vaccine  You may need a Td booster every 10 years. Varicella (chickenpox) vaccine  You may need this vaccine if you have not already been vaccinated. Zoster (shingles) vaccine  You may need this after age 57. Pneumococcal conjugate (PCV13) vaccine  One dose is recommended after age 36. Pneumococcal polysaccharide (PPSV23) vaccine  One dose is recommended after age 13. Measles, mumps, and rubella (MMR) vaccine  You may need at least one dose of MMR if you were born in 1957 or later. You may also need a second dose. Meningococcal conjugate (MenACWY) vaccine  You may need this if you have certain conditions. Hepatitis A vaccine  You may need this if you have certain conditions or if you travel or work in places where you may be exposed to hepatitis A. Hepatitis B vaccine  You may need this if you have certain conditions or if you travel or work in places where you may be exposed to hepatitis B. Haemophilus influenzae type b (Hib) vaccine  You may need this if you have certain conditions. You may receive vaccines as individual doses or as more than one vaccine together in one shot (combination vaccines). Talk with your health  care provider about the risks and benefits of combination vaccines. What tests do I need? Blood tests  Lipid and cholesterol levels. These may be checked every 5 years, or more frequently depending on your overall health.  Hepatitis C test.  Hepatitis B test. Screening  Lung cancer screening. You may have this screening every year starting at age 36 if you have a 30-pack-year history of smoking and  currently smoke or have quit within the past 15 years.  Colorectal cancer screening. All adults should have this screening starting at age 41 and continuing until age 36. Your health care provider may recommend screening at age 18 if you are at increased risk. You will have tests every 1-10 years, depending on your results and the type of screening test.  Diabetes screening. This is done by checking your blood sugar (glucose) after you have not eaten for a while (fasting). You may have this done every 1-3 years.  Mammogram. This may be done every 1-2 years. Talk with your health care provider about how often you should have regular mammograms.  BRCA-related cancer screening. This may be done if you have a family history of breast, ovarian, tubal, or peritoneal cancers. Other tests  Sexually transmitted disease (STD) testing.  Bone density scan. This is done to screen for osteoporosis. You may have this done starting at age 82. Follow these instructions at home: Eating and drinking  Eat a diet that includes fresh fruits and vegetables, whole grains, lean protein, and low-fat dairy products. Limit your intake of foods with high amounts of sugar, saturated fats, and salt.  Take vitamin and mineral supplements as recommended by your health care provider.  Do not drink alcohol if your health care provider tells you not to drink.  If you drink alcohol: ? Limit how much you have to 0-1 drink a day. ? Be aware of how much alcohol is in your drink. In the [place] one drink equals one 12 oz bottle of beer (355 mL), one 5 oz glass of wine (148 mL), or one 1 oz glass of hard liquor (44 mL). Lifestyle  Take daily care of your teeth and gums.  Stay active. Exercise for at least 30 minutes on 5 or more days each week.  Do not use any products that contain nicotine or tobacco, such as cigarettes, e-cigarettes, and chewing tobacco. If you need help quitting, ask your health care provider.  If you are  sexually active, practice safe sex. Use a condom or other form of protection in order to prevent STIs (sexually transmitted infections).  Talk with your health care provider about taking a low-dose aspirin or statin. What's next?  Go to your health care provider once a year for a well check visit.  Ask your health care provider how often you should have your eyes and teeth checked.  Stay up to date on all vaccines. This information is not intended to replace advice given to you by your health care provider. Make sure you discuss any questions you have with your health care provider. Document Released: 08/25/2015 Document Revised: 07/23/2018 Document Reviewed: 07/23/2018 Elsevier Patient Education  2020 [employer].

## 2019-07-29 ENCOUNTER — Encounter: Payer: Self-pay | Admitting: Family Medicine

## 2019-07-29 ENCOUNTER — Other Ambulatory Visit: Payer: Self-pay

## 2019-07-29 ENCOUNTER — Ambulatory Visit (INDEPENDENT_AMBULATORY_CARE_PROVIDER_SITE_OTHER): Payer: Medicare HMO | Admitting: Family Medicine

## 2019-07-29 ENCOUNTER — Ambulatory Visit: Payer: Medicare HMO | Admitting: Physical Therapy

## 2019-07-29 VITALS — BP 140/90 | HR 81 | Ht 64.0 in | Wt 267.0 lb

## 2019-07-29 DIAGNOSIS — R2689 Other abnormalities of gait and mobility: Secondary | ICD-10-CM | POA: Diagnosis not present

## 2019-07-29 DIAGNOSIS — M25562 Pain in left knee: Secondary | ICD-10-CM

## 2019-07-29 DIAGNOSIS — I10 Essential (primary) hypertension: Secondary | ICD-10-CM | POA: Diagnosis not present

## 2019-07-29 DIAGNOSIS — G4709 Other insomnia: Secondary | ICD-10-CM

## 2019-07-29 MED ORDER — TRAZODONE HCL 50 MG PO TABS: 25.0000 mg | ORAL_TABLET | Freq: Every evening | ORAL | 0 refills | Status: DC | PRN

## 2019-07-29 NOTE — Progress Notes (Signed)
Established Patient Office Visit  Subjective:  Patient ID: Kristi Hunter, female    DOB: 1940-11-22  Age: 78 y.o. MRN: [id number]  CC:  Chief Complaint  Patient presents with  . Urinary Frequency    Pt is not able to sleep due to her keeping having to wake up to use restroom    HPI [name] presents for evaluation for sleep difficulties.  Has been going on for 2 to 3 months.  She says that she has no difficulty falling asleep but will wake up a few times each night feeling the urge to urinate.  After she urinates she is able to go back to sleep but then may wake up a few hours later with the same urge.  Patient denies any daytime symptoms whatsoever.  She denies dysuria frequency urgency incomplete emptying or incontinence.  She avoids fluids at least 2 hours before bed.  She denies depression or anxiety.  Normal urinalysis a few months ago.  Was seen back in October and switched to chlorthalidone.  She just recently started the medication because she wanted to finish her HCTZ.  She is having no issues with chlorthalidone.  Past Medical History:  Diagnosis Date  . Arthritis   . Cancer ([place])    uterine  . Chicken pox   . Chronic kidney disease   . Emphysema of lung ([place])   . Glaucoma   . Hypertension   . Urine incontinence   . UTI (urinary tract infection)     Past Surgical History:  Procedure Laterality Date  . ABDOMINAL HYSTERECTOMY  2011  . REPLACEMENT TOTAL KNEE Right 2012    Family History  Problem Relation Age of Onset  . Cancer Father        skin  . Alcohol abuse Father   . Asthma Father   . COPD Father   . Heart attack Father   . Heart disease Father   . Cancer Maternal Grandfather 70       colon cancer  . Heart attack Mother   . Heart disease Mother   . Hyperlipidemia Mother   . Hypertension Mother   . Stroke Mother   . Miscarriages / [name] Mother   . Alcohol abuse Brother   . Arthritis Brother   . Drug abuse Brother   . Depression  Daughter   . Hypertension Daughter   . Mental illness Daughter   . Depression Son   . Arthritis Maternal Grandmother   . Diabetes Maternal Grandmother   . Hyperlipidemia Maternal Grandmother   . Alcohol abuse Paternal Grandfather   . Arthritis Paternal Grandfather   . Cirrhosis Paternal Grandfather   . Alcohol abuse Brother   . Breast cancer Paternal Aunt     Social History   Socioeconomic History  . Marital status: Widowed    Spouse name: Not on file  . Number of children: 3  . Years of education: Not on file  . Highest education level: Not on file  Occupational History    Comment: Retired Engineer, maintenance (IT)  Tobacco Use  . Smoking status: Former Smoker    Types: Cigarettes    Quit date: 08/12/1982    Years since quitting: 36.9  . Smokeless tobacco: Never Used  Substance and Sexual Activity  . Alcohol use: Yes    Alcohol/week: 1.0 standard drinks    Types: 1 Glasses of wine per week    Comment: every 1-2 wks.   . Drug use: Never  . Sexual activity: Not on  file  Other Topics Concern  . Not on file  Social History Narrative  . Not on file   Social Determinants of Health   Financial Resource Strain:   . Difficulty of Paying Living Expenses: Not on file  Food Insecurity:   . Worried About Charity fundraiser in the Last Year: Not on file  . Ran Out of Food in the Last Year: Not on file  Transportation Needs:   . Lack of Transportation (Medical): Not on file  . Lack of Transportation (Non-Medical): Not on file  Physical Activity:   . Days of Exercise per Week: Not on file  . Minutes of Exercise per Session: Not on file  Stress:   . Feeling of Stress : Not on file  Social Connections:   . Frequency of Communication with Friends and Family: Not on file  . Frequency of Social Gatherings with Friends and Family: Not on file  . Attends Religious Services: Not on file  . Active Member of Clubs or Organizations: Not on file  . Attends Archivist Meetings: Not on file  .  Marital Status: Not on file  Intimate Partner Violence:   . Fear of Current or Ex-Partner: Not on file  . Emotionally Abused: Not on file  . Physically Abused: Not on file  . Sexually Abused: Not on file    Outpatient Medications Prior to Visit  Medication Sig Dispense Refill  . aspirin 81 MG chewable tablet Chew by mouth daily.    . Calcium Carb-Cholecalciferol (CALCIUM 600 + D PO) Take by mouth.    . chlorthalidone (HYGROTON) 25 MG tablet Take 1 tablet (25 mg total) by mouth daily. 90 tablet 0  . Cholecalciferol (VITAMIN D3 PO) Take 50 mcg by mouth.    Marland Kitchen ketoconazole (NIZORAL) 2 % shampoo Apply 1 application topically 2 (two) times a week. 120 mL 2  . Melatonin 10 MG TABS Take by mouth.    . metroNIDAZOLE (METROGEL) 0.75 % gel Apply 1 application topically 2 (two) times daily. 45 g 1  . Multiple Vitamin (MULTIVITAMIN) tablet Take 1 tablet by mouth daily.    . naproxen sodium (ALEVE) 220 MG tablet Take 220 mg by mouth.    Marland Kitchen GLUCOSAMINE-CHONDROITIN-VIT D3 PO Take 2,000 Units by mouth.     No facility-administered medications prior to visit.    Allergies  Allergen Reactions  . Percocet [Oxycodone-Acetaminophen]     ROS Review of Systems  Constitutional: Negative.   Respiratory: Negative.   Cardiovascular: Negative.   Gastrointestinal: Negative.   Endocrine: Negative for polyphagia and polyuria.  Genitourinary: Negative for difficulty urinating, dysuria, enuresis, frequency and urgency.  Musculoskeletal: Positive for arthralgias.  Neurological: Negative for tremors and speech difficulty.      Objective:    Physical Exam  Constitutional: She is oriented to person, place, and time. She appears well-developed and well-nourished. No distress.  HENT:  Head: Normocephalic and atraumatic.  Right Ear: External ear normal.  Left Ear: External ear normal.  Eyes: Conjunctivae are normal. Right eye exhibits no discharge. Left eye exhibits no discharge. No scleral icterus.  Neck:  No JVD present. No tracheal deviation present.  Pulmonary/Chest: Effort normal. No stridor.  Neurological: She is alert and oriented to person, place, and time.  Skin: Skin is warm and dry. She is not diaphoretic.  Psychiatric: She has a normal mood and affect. Her behavior is normal.    BP 140/90   Pulse 81   Ht 5\' 4"  (1.626  m)   Wt 267 lb (121.1 kg)   BMI 45.83 kg/m  Wt Readings from Last 3 Encounters:  07/29/19 267 lb (121.1 kg)  06/07/19 260 lb (117.9 kg)  05/10/19 271 lb (122.9 kg)     Health Maintenance Due  Topic Date Due  . TETANUS/TDAP  08/04/1962  . PNA vac Low Risk Adult (1 of 2 - PCV13) 08/04/2008    There are no preventive care reminders to display for this patient.  Lab Results  Component Value Date   TSH 1.71 01/13/2019   Lab Results  Component Value Date   WBC 6.9 05/10/2019   HGB 12.4 05/10/2019   HCT 38.6 05/10/2019   MCV 83.6 05/10/2019   PLT 306.0 05/10/2019   Lab Results  Component Value Date   NA 141 05/10/2019   K 3.8 05/10/2019   CO2 32 05/10/2019   GLUCOSE 90 05/10/2019   BUN 21 05/10/2019   CREATININE 0.58 05/10/2019   BILITOT 0.4 01/13/2019   ALKPHOS 73 01/13/2019   AST 19 01/13/2019   ALT 19 01/13/2019   PROT 7.2 01/13/2019   ALBUMIN 3.6 01/13/2019   CALCIUM 9.3 05/10/2019   GFR 101.14 05/10/2019   Lab Results  Component Value Date   CHOL 151 01/13/2019   Lab Results  Component Value Date   HDL 44.90 01/13/2019   Lab Results  Component Value Date   LDLCALC 92 01/13/2019   Lab Results  Component Value Date   TRIG 72.0 01/13/2019   Lab Results  Component Value Date   CHOLHDL 3 01/13/2019   Lab Results  Component Value Date   HGBA1C 5.9 05/10/2019      Assessment & Plan:   Problem List Items Addressed This Visit      Cardiovascular and Mediastinum   Essential hypertension    Other Visit Diagnoses    Other insomnia    -  Primary   Relevant Medications   traZODone (DESYREL) 50 MG tablet      Meds  ordered this encounter  Medications  . traZODone (DESYREL) 50 MG tablet    Sig: Take 0.5 tablets (25 mg total) by mouth at bedtime as needed for sleep.    Dispense:  30 tablet    Refill:  0    Follow-up: Return if symptoms worsen or fail to improve.    Libby Maw, MD   Virtual Visit via Video Note  I connected with Kandice Hams on 07/29/19 at  1:00 PM EST by a video enabled telemedicine application and verified that I am speaking with the correct person using two identifiers.  Location: Patient: home Provider:    I discussed the limitations of evaluation and management by telemedicine and the availability of in person appointments. The patient expressed understanding and agreed to proceed.  History of Present Illness:    Observations/Objective:   Assessment and Plan:   Follow Up Instructions:    I discussed the assessment and treatment plan with the patient. The patient was provided an opportunity to ask questions and all were answered. The patient agreed with the plan and demonstrated an understanding of the instructions.   The patient was advised to call back or seek an in-person evaluation if the symptoms worsen or if the condition fails to improve as anticipated.  I provided 25 minutes of non-face-to-face time during this encounter.   Libby Maw, MD

## 2019-07-29 NOTE — Therapy (Signed)
[address] [place], [name], [address] Phone: [phone]   Fax:  [phone]  Physical Therapy Treatment  Patient Details  Name: Kristi Hunter MRN: [id number] Date of Birth: [birth year] Referring Provider (PT): Clearance Coots MD   Encounter Date: 07/29/2019  PT End of Session - 07/29/19 1100    Visit Number  12    Number of Visits  --    Date for PT Re-Evaluation  08/27/19    PT Start Time  4259    PT Stop Time  1058    PT Time Calculation (min)  43 min       Past Medical History:  Diagnosis Date  . Arthritis   . Cancer ([place])    uterine  . Chicken pox   . Chronic kidney disease   . Emphysema of lung ([place])   . Glaucoma   . Hypertension   . Urine incontinence   . UTI (urinary tract infection)     Past Surgical History:  Procedure Laterality Date  . ABDOMINAL HYSTERECTOMY  2011  . REPLACEMENT TOTAL KNEE Right 2012    There were no vitals filed for this visit.  Subjective Assessment - 07/29/19 1031    Subjective  "feeling better today"    Pertinent History  Arthritis, osteoporosis, R TKA, hx cancer, CKD, HTN    Limitations  Standing;Walking    Currently in Pain?  No/denies    Pain Score  0-No pain                       [facility] Adult PT Treatment/Exercise - 07/29/19 0001      Knee/Hip Exercises: Aerobic   Nustep  L6 x 7 min      Knee/Hip Exercises: Machines for Strengthening   Cybex Knee Extension  10# 2 sets 15    Cybex Knee Flexion  35# 2 sets 15      Knee/Hip Exercises: Standing   Lateral Step Up  Step Height: 4";Left;20 reps;Hand Hold: 2    Forward Step Up  Left;Hand Hold: 2;Step Height: 4";20 reps      Knee/Hip Exercises: Seated   Hamstring Curl  Left;Strengthening;2 sets;15 reps    Hamstring Limitations  green theraband               PT Short Term Goals - 06/25/19 1026      PT SHORT TERM GOAL #1   Title  Pt will be independent with initial  HEP in order to demonstrate autonomy of care.    Status  Achieved      PT SHORT TERM GOAL #2   Title  Pt will demonstrate 85 degrees L knee flexion and 0 degrees L knee extension in order to demonstrate improved functional ROM to decrease stress on surrounding musculature and to match the RLE.    Status  Achieved        PT Long Term Goals - 07/29/19 1033      PT LONG TERM GOAL #1   Title  Pt will demonstrate 4/5 strength Bil hip flexion/abd and 5/5 strength L knee extension in order to demonstrate improved functional strength.    Status  Partially Met      PT LONG TERM GOAL #2   Title  Pt will report pain 3/10 or less within 5 weeks in order to demonstrate a statistically significant decrease in pain in the LLE to improve quality of life and decrease risk for immobility.  Status  Partially Met            Plan - 07/29/19 1058    Clinical Impression Statement  pt was assited with donning brace. brace needed tighteninga couple times throughout session. pt needs cues to improve form and technique of exercises. pt has increased pain with knee extension. pt able to perform forward and lateral step ups. she has more difficulty with lateral step ups. her motion is limited due to the knee brace. rest breaks are needed secondary to fatigue.    Personal Factors and Comorbidities  Comorbidity 3+    Comorbidities  Arthritis, osteoporosis, R TKA, hx cancer, CKD, HTN    Stability/Clinical Decision Making  Stable/Uncomplicated    Rehab Potential  Good    PT Frequency  2x / week    PT Treatment/Interventions  ADLs/Self Care Home Management;Moist Heat;Electrical Stimulation;Cryotherapy;Gait training;Stair training;Functional mobility training;Therapeutic activities;Therapeutic exercise;Balance training;Neuromuscular re-education;Patient/family education;Manual techniques;Taping    PT Next Visit Plan  LE strengthening esp Left,    Consulted and Agree with Plan of Care  Patient       Patient  will benefit from skilled therapeutic intervention in order to improve the following deficits and impairments:  Decreased range of motion, Pain, Decreased strength  Visit Diagnosis: Acute pain of left knee     Problem List Patient Active Problem List   Diagnosis Date Noted  . Acute pain of left knee 06/07/2019  . Prediabetes 05/10/2019  . Glaucoma of both eyes 05/10/2019  . Essential hypertension 05/10/2019  . Dry scalp 05/10/2019  . Age-related osteoporosis without current pathological fracture 05/10/2019  . Seborrheic keratosis 01/11/2019  . Rosacea 01/11/2019  . Family history of colon cancer 01/11/2019  . Edema 08/27/2018  . Primary osteoarthritis 08/27/2018  . Sciatica of right side 08/27/2018  . Depression, major, single episode, complete remission ([place]) 08/27/2018  . Vitamin D deficiency 08/27/2018  . Mixed hyperlipidemia 08/27/2018  . Anxiety 08/27/2018  . Hyperglycemia 08/27/2018  . HTN (hypertension), benign 08/27/2018    [name], Eleanor 07/29/2019, 12:15 PM  [address] [place], [name], [address] Phone: [phone]   Fax:  [phone]  Name: Lovelyn Sheeran MRN: [id number] Date of Birth: 1940-08-24

## 2019-08-03 ENCOUNTER — Ambulatory Visit: Payer: Medicare HMO | Admitting: Physical Therapy

## 2019-08-03 ENCOUNTER — Other Ambulatory Visit: Payer: Self-pay

## 2019-08-03 DIAGNOSIS — R2689 Other abnormalities of gait and mobility: Secondary | ICD-10-CM | POA: Diagnosis not present

## 2019-08-03 DIAGNOSIS — M25562 Pain in left knee: Secondary | ICD-10-CM

## 2019-08-03 NOTE — Therapy (Signed)
[address] [place], [name], [address] Phone: [phone]   Fax:  [phone]  Physical Therapy Treatment  Patient Details  Name: Kristi Hunter MRN: [id number] Date of Birth: 1941-04-02 Referring Provider (PT): Clearance Coots MD   Encounter Date: 08/03/2019  PT End of Session - 08/03/19 1042    Visit Number  13    Date for PT Re-Evaluation  08/27/19    PT Start Time  6599    PT Stop Time  1058    PT Time Calculation (min)  43 min       Past Medical History:  Diagnosis Date  . Arthritis   . Cancer ([place])    uterine  . Chicken pox   . Chronic kidney disease   . Emphysema of lung ([place])   . Glaucoma   . Hypertension   . Urine incontinence   . UTI (urinary tract infection)     Past Surgical History:  Procedure Laterality Date  . ABDOMINAL HYSTERECTOMY  2011  . REPLACEMENT TOTAL KNEE Right 2012    There were no vitals filed for this visit.  Subjective Assessment - 08/03/19 1021    Subjective  both knees hurt today like it is 20 degrees outside. pt again needs brace donned    Currently in Pain?  Yes    Pain Score  6          [facility] PT Assessment - 08/03/19 0001      AROM   Right Knee Extension  0    Right Knee Flexion  108    Left Knee Extension  0    Left Knee Flexion  86                   [facility] Adult PT Treatment/Exercise - 08/03/19 0001      Knee/Hip Exercises: Aerobic   Nustep  L6 x 7 min      Knee/Hip Exercises: Machines for Strengthening   Cybex Knee Extension  10# 2 sets 15    Cybex Knee Flexion  35# 2 sets 15      Knee/Hip Exercises: Standing   Other Standing Knee Exercises  hip 3 way 15 each green tband     Other Standing Knee Exercises  3 # 6 inch step tap 20 x alt LE 2 times      Knee/Hip Exercises: Seated   Long Arc Quad  Strengthening;Left;2 sets;Weights;10 reps;Right   hold 3 sec at [employer]  3 lbs.    Ball Squeeze  2 sets 15     Clamshell with TheraBand  Blue   20   Sit to [employer]  10 reps;without UE support   wt ball              PT Short Term Goals - 08/03/19 1037      PT SHORT TERM GOAL #2   Title  Pt will demonstrate 85 degrees L knee flexion and 0 degrees L knee extension in order to demonstrate improved functional ROM to decrease stress on surrounding musculature and to match the RLE.    Status  Achieved        PT Long Term Goals - 08/03/19 1037      PT LONG TERM GOAL #1   Title  Pt will demonstrate 4/5 strength Bil hip flexion/abd and 5/5 strength L knee extension in order to demonstrate improved functional strength.    Baseline  Bil hip flexion/abd 4/5, L knee extension 4/5    Status  Partially Met      PT LONG TERM GOAL #2   Title  Pt will report pain 3/10 or less within 5 weeks in order to demonstrate a statistically significant decrease in pain in the LLE to improve quality of life and decrease risk for immobility.    Baseline  pain varies from day to day    Status  On-going      PT LONG TERM GOAL #3   Title  Pt will ambulate with LRAD with fluid steps equal stride and step length BIL w/o hip hiking or antalgic gait within 6 weeks to decrease increased stress on contralateral LE.    Status  Achieved            Plan - 08/03/19 1042    Clinical Impression Statement  ROM and strength progressing. pt still c/o pain and high at times, varies daily. pt still requires assistance to [facility] brace. pt amb with AD in clinic and does well but verb using a walker at home for stability    PT Treatment/Interventions  ADLs/Self Care Home Management;Moist Heat;Electrical Stimulation;Cryotherapy;Gait training;Stair training;Functional mobility training;Therapeutic activities;Therapeutic exercise;Balance training;Neuromuscular re-education;Patient/family education;Manual techniques;Taping    PT Next Visit Plan  LE strengthening esp Left,       Patient will benefit from skilled therapeutic  intervention in order to improve the following deficits and impairments:  Decreased range of motion, Pain, Decreased strength  Visit Diagnosis: Acute pain of left knee  Other abnormalities of gait and mobility     Problem List Patient Active Problem List   Diagnosis Date Noted  . Acute pain of left knee 06/07/2019  . Prediabetes 05/10/2019  . Glaucoma of both eyes 05/10/2019  . Essential hypertension 05/10/2019  . Dry scalp 05/10/2019  . Age-related osteoporosis without current pathological fracture 05/10/2019  . Seborrheic keratosis 01/11/2019  . Rosacea 01/11/2019  . Family history of colon cancer 01/11/2019  . Edema 08/27/2018  . Primary osteoarthritis 08/27/2018  . Sciatica of right side 08/27/2018  . Depression, major, single episode, complete remission ([place]) 08/27/2018  . Vitamin D deficiency 08/27/2018  . Mixed hyperlipidemia 08/27/2018  . Anxiety 08/27/2018  . Hyperglycemia 08/27/2018  . HTN (hypertension), benign 08/27/2018    Nyiah Pianka,ANGIE PTA 08/03/2019, 10:44 AM  [address] [place], [name], [address] Phone: [phone]   Fax:  [phone]  Name: Kristi Hunter MRN: [id number] Date of Birth: 1941/01/20

## 2019-08-09 ENCOUNTER — Ambulatory Visit: Payer: Medicare HMO | Admitting: Physical Therapy

## 2019-08-09 ENCOUNTER — Other Ambulatory Visit: Payer: Self-pay

## 2019-08-09 ENCOUNTER — Encounter: Payer: Self-pay | Admitting: Physical Therapy

## 2019-08-09 DIAGNOSIS — R2689 Other abnormalities of gait and mobility: Secondary | ICD-10-CM | POA: Diagnosis not present

## 2019-08-09 DIAGNOSIS — M25562 Pain in left knee: Secondary | ICD-10-CM

## 2019-08-09 NOTE — Therapy (Signed)
[place] [address] [place], [name], [address] Phone: [phone]   Fax:  [phone]  Physical Therapy Treatment  Patient Details  Name: Kristi Hunter MRN: [id number] Date of Birth: 1941/05/30 Referring Provider (PT): Clearance Coots MD   Encounter Date: 08/09/2019  PT End of Session - 08/09/19 1055    Visit Number  14    Date for PT Re-Evaluation  08/27/19    PT Start Time  6962    PT Stop Time  1100    PT Time Calculation (min)  45 min    Activity Tolerance  Patient tolerated treatment well    Behavior During Therapy  [facility] for tasks assessed/performed       Past Medical History:  Diagnosis Date  . Arthritis   . Cancer ([place])    uterine  . Chicken pox   . Chronic kidney disease   . Emphysema of lung ([place])   . Glaucoma   . Hypertension   . Urine incontinence   . UTI (urinary tract infection)     Past Surgical History:  Procedure Laterality Date  . ABDOMINAL HYSTERECTOMY  2011  . REPLACEMENT TOTAL KNEE Right 2012    There were no vitals filed for this visit.  Subjective Assessment - 08/09/19 1017    Subjective  Both legs are hurting    Currently in Pain?  Yes    Pain Score  5     Pain Location  Leg    Pain Orientation  Left;Right                       [facility] Adult PT Treatment/Exercise - 08/09/19 0001      Knee/Hip Exercises: Aerobic   Nustep  L6 x 7 min      Knee/Hip Exercises: Machines for Strengthening   Cybex Knee Extension  10# 2 sets 15    Cybex Knee Flexion  35# 2 sets 15      Knee/Hip Exercises: Standing   Lateral Step Up  Step Height: 4";Left;Hand Hold: 2;10 reps;2 sets    Forward Step Up  Left;Hand Hold: 2;Step Height: 4";1 set;10 reps;Step Height: 6"    Other Standing Knee Exercises  Marching  & hip abd, 2x10 with UE assit for support       Knee/Hip Exercises: Seated   Long Arc Quad  Strengthening;Left;2 sets;Weights;10 reps;Right   3 sec TKE   Long  Arc Quad Weight  3 lbs.    Sit to Sand  10 reps;without UE support;2 sets               PT Short Term Goals - 08/03/19 1037      PT SHORT TERM GOAL #2   Title  Pt will demonstrate 85 degrees L knee flexion and 0 degrees L knee extension in order to demonstrate improved functional ROM to decrease stress on surrounding musculature and to match the RLE.    Status  Achieved        PT Long Term Goals - 08/03/19 1037      PT LONG TERM GOAL #1   Title  Pt will demonstrate 4/5 strength Bil hip flexion/abd and 5/5 strength L knee extension in order to demonstrate improved functional strength.    Baseline  Bil hip flexion/abd 4/5, L knee extension 4/5    Status  Partially Met      PT LONG TERM GOAL #2   Title  Pt will report  pain 3/10 or less within 5 weeks in order to demonstrate a statistically significant decrease in pain in the LLE to improve quality of life and decrease risk for immobility.    Baseline  pain varies from day to day    Status  On-going      PT LONG TERM GOAL #3   Title  Pt will ambulate with LRAD with fluid steps equal stride and step length BIL w/o hip hiking or antalgic gait within 6 weeks to decrease increased stress on contralateral LE.    Status  Achieved            Plan - 08/09/19 1056    Clinical Impression Statement  Pt reports that yesterday her knees felt better. Max assist to [facility] brace. Cues not to circumduct LLE with step ups. Progressing with strength and ROM. She continues to use walker at home for balance ad security    Comorbidities  Arthritis, osteoporosis, R TKA, hx cancer, CKD, HTN    Stability/Clinical Decision Making  Stable/Uncomplicated    Rehab Potential  Good    PT Frequency  2x / week    PT Treatment/Interventions  ADLs/Self Care Home Management;Moist Heat;Electrical Stimulation;Cryotherapy;Gait training;Stair training;Functional mobility training;Therapeutic activities;Therapeutic exercise;Balance training;Neuromuscular  re-education;Patient/family education;Manual techniques;Taping    PT Next Visit Plan  LE strengthening esp Left,       Patient will benefit from skilled therapeutic intervention in order to improve the following deficits and impairments:  Decreased range of motion, Pain, Decreased strength  Visit Diagnosis: Acute pain of left knee  Other abnormalities of gait and mobility     Problem List Patient Active Problem List   Diagnosis Date Noted  . Acute pain of left knee 06/07/2019  . Prediabetes 05/10/2019  . Glaucoma of both eyes 05/10/2019  . Essential hypertension 05/10/2019  . Dry scalp 05/10/2019  . Age-related osteoporosis without current pathological fracture 05/10/2019  . Seborrheic keratosis 01/11/2019  . Rosacea 01/11/2019  . Family history of colon cancer 01/11/2019  . Edema 08/27/2018  . Primary osteoarthritis 08/27/2018  . Sciatica of right side 08/27/2018  . Depression, major, single episode, complete remission ([place]) 08/27/2018  . Vitamin D deficiency 08/27/2018  . Mixed hyperlipidemia 08/27/2018  . Anxiety 08/27/2018  . Hyperglycemia 08/27/2018  . HTN (hypertension), benign 08/27/2018    Scot Jun, PTA 08/09/2019, 10:59 AM  [address] [place], [name], [address] Phone: [phone]   Fax:  [phone]  Name: Kristi Hunter MRN: [id number] Date of Birth: 04-16-1941

## 2019-08-10 ENCOUNTER — Ambulatory Visit: Payer: Medicare HMO | Admitting: Physical Therapy

## 2019-08-11 ENCOUNTER — Other Ambulatory Visit: Payer: Self-pay

## 2019-08-11 ENCOUNTER — Encounter: Payer: Self-pay | Admitting: Physical Therapy

## 2019-08-11 ENCOUNTER — Ambulatory Visit: Payer: Medicare HMO | Admitting: Physical Therapy

## 2019-08-11 DIAGNOSIS — M25562 Pain in left knee: Secondary | ICD-10-CM

## 2019-08-11 DIAGNOSIS — R2689 Other abnormalities of gait and mobility: Secondary | ICD-10-CM | POA: Diagnosis not present

## 2019-08-11 NOTE — Therapy (Signed)
[place], [name], [address] Phone: [phone]   Fax:  [phone]  Physical Therapy Treatment  Patient Details  Name: Kristi Hunter MRN: [id number] Date of Birth: 1940/08/26 Referring Provider (PT): Clearance Coots MD   Encounter Date: 08/11/2019  PT End of Session - 08/11/19 1058    Visit Number  15    Date for PT Re-Evaluation  08/27/19    PT Start Time  0626    PT Stop Time  1100    PT Time Calculation (min)  45 min    Activity Tolerance  Patient tolerated treatment well    Behavior During Therapy  [facility] for tasks assessed/performed       Past Medical History:  Diagnosis Date  . Arthritis   . Cancer ([place])    uterine  . Chicken pox   . Chronic kidney disease   . Emphysema of lung ([place])   . Glaucoma   . Hypertension   . Urine incontinence   . UTI (urinary tract infection)     Past Surgical History:  Procedure Laterality Date  . ABDOMINAL HYSTERECTOMY  2011  . REPLACEMENT TOTAL KNEE Right 2012    There were no vitals filed for this visit.  Subjective Assessment - 08/11/19 1016    Subjective  "no good" Pt reports she is hurting due to the cold weather    Currently in Pain?  Yes    Pain Score  4     Pain Location  Hand   and knees   Pain Orientation  Left;Right                       [facility] Adult PT Treatment/Exercise - 08/11/19 0001      Knee/Hip Exercises: Aerobic   Nustep  L6 x 7 min      Knee/Hip Exercises: Machines for Strengthening   Cybex Knee Extension  10# 2 sets 15    Cybex Knee Flexion  35# 2 sets 15      Knee/Hip Exercises: Standing   Lateral Step Up  Step Height: 4";Left;Hand Hold: 2;10 reps;2 sets    Walking with Sports Cord  resisted gait fwd/bck 30# x 3    Other Standing Knee Exercises  Marching, hip abd, ans ext 2x10 with UE assit for support       Knee/Hip Exercises: Seated   Ball Squeeze  2 sets 15    Sit to Sand  10  reps;without UE support;2 sets               PT Short Term Goals - 08/03/19 1037      PT SHORT TERM GOAL #2   Title  Pt will demonstrate 85 degrees L knee flexion and 0 degrees L knee extension in order to demonstrate improved functional ROM to decrease stress on surrounding musculature and to match the RLE.    Status  Achieved        PT Long Term Goals - 08/11/19 1034      PT LONG TERM GOAL #1   Title  Pt will demonstrate 4/5 strength Bil hip flexion/abd and 5/5 strength L knee extension in order to demonstrate improved functional strength.    Status  Partially Met      PT LONG TERM GOAL #2   Title  Pt will report pain 3/10 or less within 5 weeks in order to demonstrate a statistically significant decrease in pain in the  LLE to improve quality of life and decrease risk for immobility.    Status  On-going      PT LONG TERM GOAL #3   Status  Achieved            Plan - 08/11/19 1058    Clinical Impression Statement  Max assist to [facility] brace. Pt becomes very fatigue with sit to stands and does compensate using RLE more despite cues. Good stability with resisted gait, but requires cues to control the resistance during the eccentric phase of backwards walking. No issues with leg curs and extensions other than needing cues to complete full ROM.    Comorbidities  Arthritis, osteoporosis, R TKA, hx cancer, CKD, HTN    Stability/Clinical Decision Making  Stable/Uncomplicated    Rehab Potential  Good    PT Frequency  2x / week    PT Treatment/Interventions  ADLs/Self Care Home Management;Moist Heat;Electrical Stimulation;Cryotherapy;Gait training;Stair training;Functional mobility training;Therapeutic activities;Therapeutic exercise;Balance training;Neuromuscular re-education;Patient/family education;Manual techniques;Taping    PT Next Visit Plan  LE strengthening esp Left,       Patient will benefit from skilled therapeutic intervention in order to improve the following  deficits and impairments:  Decreased range of motion, Pain, Decreased strength  Visit Diagnosis: Acute pain of left knee  Other abnormalities of gait and mobility     Problem List Patient Active Problem List   Diagnosis Date Noted  . Acute pain of left knee 06/07/2019  . Prediabetes 05/10/2019  . Glaucoma of both eyes 05/10/2019  . Essential hypertension 05/10/2019  . Dry scalp 05/10/2019  . Age-related osteoporosis without current pathological fracture 05/10/2019  . Seborrheic keratosis 01/11/2019  . Rosacea 01/11/2019  . Family history of colon cancer 01/11/2019  . Edema 08/27/2018  . Primary osteoarthritis 08/27/2018  . Sciatica of right side 08/27/2018  . Depression, major, single episode, complete remission ([place]) 08/27/2018  . Vitamin D deficiency 08/27/2018  . Mixed hyperlipidemia 08/27/2018  . Anxiety 08/27/2018  . Hyperglycemia 08/27/2018  . HTN (hypertension), benign 08/27/2018    Scot Jun, PTA 08/11/2019, 11:01 AM  [address] [address] [place], [name], [address] Phone: [phone]   Fax:  [phone]  Name: Kristi Hunter MRN: [id number] Date of Birth: April 10, 1941

## 2019-08-16 ENCOUNTER — Telehealth: Payer: Self-pay | Admitting: Family Medicine

## 2019-08-16 ENCOUNTER — Ambulatory Visit: Payer: Medicare HMO | Attending: Family Medicine | Admitting: Physical Therapy

## 2019-08-16 ENCOUNTER — Encounter: Payer: Self-pay | Admitting: Physical Therapy

## 2019-08-16 ENCOUNTER — Other Ambulatory Visit: Payer: Self-pay

## 2019-08-16 DIAGNOSIS — R2689 Other abnormalities of gait and mobility: Secondary | ICD-10-CM | POA: Insufficient documentation

## 2019-08-16 DIAGNOSIS — M25562 Pain in left knee: Secondary | ICD-10-CM | POA: Insufficient documentation

## 2019-08-16 NOTE — Therapy (Signed)
[name], [name], [address] Phone: [phone]   Fax:  [phone]  Physical Therapy Treatment  Patient Details  Name: Kristi Hunter MRN: [id number] Date of Birth: 10/27/1940 Referring Provider (PT): Clearance Coots MD   Encounter Date: 08/16/2019  PT End of Session - 08/16/19 1053    Visit Number  16    Date for PT Re-Evaluation  08/27/19    PT Start Time  5188    PT Stop Time  1059    PT Time Calculation (min)  44 min    Activity Tolerance  Patient tolerated treatment well    Behavior During Therapy  [facility] for tasks assessed/performed       Past Medical History:  Diagnosis Date  . Arthritis   . Cancer ([place])    uterine  . Chicken pox   . Chronic kidney disease   . Emphysema of lung ([place])   . Glaucoma   . Hypertension   . Urine incontinence   . UTI (urinary tract infection)     Past Surgical History:  Procedure Laterality Date  . ABDOMINAL HYSTERECTOMY  2011  . REPLACEMENT TOTAL KNEE Right 2012    There were no vitals filed for this visit.  Subjective Assessment - 08/16/19 1010    Subjective  Pt reports that she has been wearing her knee sleeve more and that has been better with mobility and pain.    Currently in Pain?  Yes    Pain Score  2     Pain Location  Knee    Pain Orientation  Left;Right                       [facility] Adult PT Treatment/Exercise - 08/16/19 0001      Knee/Hip Exercises: Aerobic   Nustep  L6 x 7 min      Knee/Hip Exercises: Machines for Strengthening   Cybex Knee Extension  10# 3sets 10    Cybex Knee Flexion  35# 2 sets 15      Knee/Hip Exercises: Standing   Heel Raises  Both;2 sets;15 reps    Walking with Sports Cord  resisted gait 4 way 30# x 3, 3 seated rest breaks required     Other Standing Knee Exercises  hip abd & ext red 2x10       Knee/Hip Exercises: Seated   Sit to Sand  3 sets;10 reps;without UE support   blue Chair                PT Short Term Goals - 08/03/19 1037      PT SHORT TERM GOAL #2   Title  Pt will demonstrate 85 degrees L knee flexion and 0 degrees L knee extension in order to demonstrate improved functional ROM to decrease stress on surrounding musculature and to match the RLE.    Status  Achieved        PT Long Term Goals - 08/11/19 1034      PT LONG TERM GOAL #1   Title  Pt will demonstrate 4/5 strength Bil hip flexion/abd and 5/5 strength L knee extension in order to demonstrate improved functional strength.    Status  Partially Met      PT LONG TERM GOAL #2   Title  Pt will report pain 3/10 or less within 5 weeks in order to demonstrate a statistically significant decrease in pain in the LLE to improve quality  of life and decrease risk for immobility.    Status  On-going      PT LONG TERM GOAL #3   Status  Achieved            Plan - 08/16/19 1053    Clinical Impression Statement  Pt reports increase mobility and decrease pain with the knee sleeve compared to the brace. Pt is very deconditioned needing frequent rest today. She reports less pain when standing using sleeve. Cues to maintain good posture with standing hip exercises.    Comorbidities  Arthritis, osteoporosis, R TKA, hx cancer, CKD, HTN    Stability/Clinical Decision Making  Stable/Uncomplicated    Rehab Potential  Good    PT Frequency  2x / week    PT Treatment/Interventions  ADLs/Self Care Home Management;Moist Heat;Electrical Stimulation;Cryotherapy;Gait training;Stair training;Functional mobility training;Therapeutic activities;Therapeutic exercise;Balance training;Neuromuscular re-education;Patient/family education;Manual techniques;Taping    PT Next Visit Plan  LE strengthening esp Left,       Patient will benefit from skilled therapeutic intervention in order to improve the following deficits and impairments:  Decreased range of motion, Pain, Decreased strength  Visit Diagnosis: Other  abnormalities of gait and mobility  Acute pain of left knee     Problem List Patient Active Problem List   Diagnosis Date Noted  . Acute pain of left knee 06/07/2019  . Prediabetes 05/10/2019  . Glaucoma of both eyes 05/10/2019  . Essential hypertension 05/10/2019  . Dry scalp 05/10/2019  . Age-related osteoporosis without current pathological fracture 05/10/2019  . Seborrheic keratosis 01/11/2019  . Rosacea 01/11/2019  . Family history of colon cancer 01/11/2019  . Edema 08/27/2018  . Primary osteoarthritis 08/27/2018  . Sciatica of right side 08/27/2018  . Depression, major, single episode, complete remission ([place]) 08/27/2018  . Vitamin D deficiency 08/27/2018  . Mixed hyperlipidemia 08/27/2018  . Anxiety 08/27/2018  . Hyperglycemia 08/27/2018  . HTN (hypertension), benign 08/27/2018    Scot Jun, PTA 08/16/2019, 10:57 AM  [place], [name], [address] Phone: [phone]   Fax:  [phone]  Name: Kristi Hunter MRN: [id number] Date of Birth: Nov 05, 1940

## 2019-08-16 NOTE — Telephone Encounter (Signed)
Patient requesting a call back. States she is still experiencing pain in her left knee / leg. She has been wearing the brace but states she does not feel like it has helped. She would like to know what she should do next and states she has a couple more questions.   Call back # [phone]

## 2019-08-17 NOTE — Telephone Encounter (Signed)
Spoke with patient about questions.   Rosemarie Ax, MD [facility] 08/17/2019, 1:57 PM

## 2019-08-19 ENCOUNTER — Ambulatory Visit: Payer: Self-pay

## 2019-08-19 ENCOUNTER — Other Ambulatory Visit: Payer: Self-pay

## 2019-08-19 ENCOUNTER — Encounter: Payer: Self-pay | Admitting: Family Medicine

## 2019-08-19 ENCOUNTER — Ambulatory Visit: Payer: Medicare HMO | Admitting: Family Medicine

## 2019-08-19 ENCOUNTER — Ambulatory Visit: Payer: Medicare HMO | Admitting: Physical Therapy

## 2019-08-19 VITALS — BP 139/73 | Ht 64.0 in | Wt 259.0 lb

## 2019-08-19 DIAGNOSIS — R2689 Other abnormalities of gait and mobility: Secondary | ICD-10-CM

## 2019-08-19 DIAGNOSIS — M25562 Pain in left knee: Secondary | ICD-10-CM | POA: Diagnosis not present

## 2019-08-19 DIAGNOSIS — M1712 Unilateral primary osteoarthritis, left knee: Secondary | ICD-10-CM

## 2019-08-19 MED ORDER — TRIAMCINOLONE ACETONIDE 40 MG/ML IJ SUSP
40.0000 mg | Freq: Once | INTRAMUSCULAR | Status: AC
  Administered 2019-08-19: 16:00:00 40 mg via INTRA_ARTICULAR

## 2019-08-19 NOTE — Patient Instructions (Signed)
Good to see you Please try ice  Please continue the exercises   Please send me a message in MyChart with any questions or updates.  Please see me back in 6-8 weeks.   --Dr. Raeford Razor

## 2019-08-19 NOTE — Progress Notes (Signed)
Kristi Hunter - 79 y.o. female MRN [id number]  Date of birth: 15-Apr-1941  SUBJECTIVE:  Including CC & ROS.  Chief Complaint  Patient presents with  . Follow-up    follow up for left knee    Kristi Hunter is a 79 y.o. female that is presenting with acute worsening of her left knee pain.  She has been doing physical therapy and home exercises with some improvement.  Most recently has gotten severe.  It is occurring over the lateral compartment.  She denies any mechanical symptoms.  It seems to be worse when she gets up out of a seated position.  Pain is localized to the knee.  No history of similar symptoms.  Independent review of the left knee x-rays from 10/26 show moderate joint space narrowing of the medial compartment.    Review of Systems See HPI   HISTORY: Past Medical, Surgical, Social, and Family History Reviewed & Updated per EMR.   Pertinent Historical Findings include:  Past Medical History:  Diagnosis Date  . Arthritis   . Cancer ([place])    uterine  . Chicken pox   . Chronic kidney disease   . Emphysema of lung ([place])   . Glaucoma   . Hypertension   . Urine incontinence   . UTI (urinary tract infection)     Past Surgical History:  Procedure Laterality Date  . ABDOMINAL HYSTERECTOMY  2011  . REPLACEMENT TOTAL KNEE Right 2012    Allergies  Allergen Reactions  . Percocet [Oxycodone-Acetaminophen]     Family History  Problem Relation Age of Onset  . Cancer Father        skin  . Alcohol abuse Father   . Asthma Father   . COPD Father   . Heart attack Father   . Heart disease Father   . Cancer Maternal Grandfather 53       colon cancer  . Heart attack Mother   . Heart disease Mother   . Hyperlipidemia Mother   . Hypertension Mother   . Stroke Mother   . Miscarriages / [name] Mother   . Alcohol abuse Brother   . Arthritis Brother   . Drug abuse Brother   . Depression Daughter   . Hypertension Daughter   . Mental illness Daughter   .  Depression Son   . Arthritis Maternal Grandmother   . Diabetes Maternal Grandmother   . Hyperlipidemia Maternal Grandmother   . Alcohol abuse Paternal Grandfather   . Arthritis Paternal Grandfather   . Cirrhosis Paternal Grandfather   . Alcohol abuse Brother   . Breast cancer Paternal Aunt      Social History   Socioeconomic History  . Marital status: Widowed    Spouse name: Not on file  . Number of children: 3  . Years of education: Not on file  . Highest education level: Not on file  Occupational History    Comment: Retired Engineer, maintenance (IT)  Tobacco Use  . Smoking status: Former Smoker    Types: Cigarettes    Quit date: 08/12/1982    Years since quitting: 37.0  . Smokeless tobacco: Never Used  Substance and Sexual Activity  . Alcohol use: Yes    Alcohol/week: 1.0 standard drinks    Types: 1 Glasses of wine per week    Comment: every 1-2 wks.   . Drug use: Never  . Sexual activity: Not on file  Other Topics Concern  . Not on file  Social History Narrative  . Not on file  Social Determinants of Health   Financial Resource Strain:   . Difficulty of Paying Living Expenses: Not on file  Food Insecurity:   . Worried About Charity fundraiser in the Last Year: Not on file  . Ran Out of Food in the Last Year: Not on file  Transportation Needs:   . Lack of Transportation (Medical): Not on file  . Lack of Transportation (Non-Medical): Not on file  Physical Activity:   . Days of Exercise per Week: Not on file  . Minutes of Exercise per Session: Not on file  Stress:   . Feeling of Stress : Not on file  Social Connections:   . Frequency of Communication with Friends and Family: Not on file  . Frequency of Social Gatherings with Friends and Family: Not on file  . Attends Religious Services: Not on file  . Active Member of Clubs or Organizations: Not on file  . Attends Archivist Meetings: Not on file  . Marital Status: Not on file  Intimate Partner Violence:   . Fear of  Current or Ex-Partner: Not on file  . Emotionally Abused: Not on file  . Physically Abused: Not on file  . Sexually Abused: Not on file     PHYSICAL EXAM:  VS: BP 139/73   Ht 5\' 4"  (1.626 m)   Wt 259 lb (117.5 kg)   BMI 44.46 kg/m  Physical Exam Gen: NAD, alert, cooperative with exam, well-appearing ENT: normal lips, normal nasal mucosa,  Eye: normal EOM, normal conjunctiva and lids Skin: no rashes, no areas of induration  Neuro: normal tone, normal sensation to touch Psych:  normal insight, alert and oriented MSK:  Left knee: No obvious effusion. Normal range of motion. Negative McMurray's test. Some instability with valgus and varus stress testing. Some pain with patellar grind. Neurovascularly intact   Aspiration/Injection Procedure Note Kristi Hunter 28-Nov-1940  Procedure: Injection Indications: Left knee pain  Procedure Details Consent: Risks of procedure as well as the alternatives and risks of each were explained to the (patient/caregiver).  Consent for procedure obtained. Time Out: Verified patient identification, verified procedure, site/side was marked, verified correct patient position, special equipment/implants available, medications/allergies/relevent history reviewed, required imaging and test results available.  Performed.  The area was cleaned with iodine and alcohol swabs.    The left knee superior lateral suprapatellar pouch was injected using 1 cc's of 40 mg Kenalog and 4 cc's of 0.25% bupivacaine with a 22 1 1/2" needle.  Ultrasound was used. Images were obtained in long views showing the injection.     A sterile dressing was applied.  Patient did tolerate procedure well.     ASSESSMENT & PLAN:   OA (osteoarthritis) of knee Pain most likely result of degenerative changes with effusion noted on exam today.  Limited provement with modalities to date. -Injection. -Counseled on home exercise therapy and supportive care. -Continue physical  therapy. -Could consider gel injections.

## 2019-08-19 NOTE — Assessment & Plan Note (Signed)
Pain most likely result of degenerative changes with effusion noted on exam today.  Limited provement with modalities to date. -Injection. -Counseled on home exercise therapy and supportive care. -Continue physical therapy. -Could consider gel injections.

## 2019-08-19 NOTE — Therapy (Signed)
[place], [name], [address] Phone: [phone]   Fax:  [phone]  Physical Therapy Treatment  Patient Details  Name: Kristi Hunter MRN: [id number] Date of Birth: 20-Apr-1941 Referring Provider (PT): Clearance Coots MD   Encounter Date: 08/19/2019  PT End of Session - 08/19/19 1044    Visit Number  17    Date for PT Re-Evaluation  08/27/19    PT Start Time  1010    PT Stop Time  1050    PT Time Calculation (min)  40 min       Past Medical History:  Diagnosis Date  . Arthritis   . Cancer ([place])    uterine  . Chicken pox   . Chronic kidney disease   . Emphysema of lung ([place])   . Glaucoma   . Hypertension   . Urine incontinence   . UTI (urinary tract infection)     Past Surgical History:  Procedure Laterality Date  . ABDOMINAL HYSTERECTOMY  2011  . REPLACEMENT TOTAL KNEE Right 2012    There were no vitals filed for this visit.  Subjective Assessment - 08/19/19 1015    Subjective  pain is much better since switched brace, seeing MD today to possibly get fluid drained    Currently in Pain?  Yes    Pain Score  2     Pain Location  Knee    Pain Orientation  Left                       [facility] Adult PT Treatment/Exercise - 08/19/19 0001      Knee/Hip Exercises: Aerobic   Nustep  L6 x 7 min      Knee/Hip Exercises: Machines for Strengthening   Cybex Knee Extension  10# 3sets 10    Cybex Knee Flexion  35# 2 sets 15      Knee/Hip Exercises: Standing   Heel Raises  Both;2 sets;15 reps    Other Standing Knee Exercises  hip flex, abd & ext red 2x10       Knee/Hip Exercises: Seated   Long Arc Quad  Strengthening;Left;2 sets;10 reps;Weights   3 sec TKE hold   Long Arc Quad Weight  5 lbs.    Sit to Sand  2 sets;without UE support;10 reps   yellow wt ball              PT Short Term Goals - 08/03/19 1037      PT SHORT TERM GOAL #2   Title  Pt will  demonstrate 85 degrees L knee flexion and 0 degrees L knee extension in order to demonstrate improved functional ROM to decrease stress on surrounding musculature and to match the RLE.    Status  Achieved        PT Long Term Goals - 08/19/19 1034      PT LONG TERM GOAL #1   Title  Pt will demonstrate 4/5 strength Bil hip flexion/abd and 5/5 strength L knee extension in order to demonstrate improved functional strength.    Status  Achieved      PT LONG TERM GOAL #2   Title  Pt will report pain 3/10 or less within 5 weeks in order to demonstrate a statistically significant decrease in pain in the LLE to improve quality of life and decrease risk for immobility.    Baseline  pain varies from day to day    Status  Partially Met      PT LONG TERM GOAL #3   Title  Pt will ambulate with LRAD with fluid steps equal stride and step length BIL w/o hip hiking or antalgic gait within 6 weeks to decrease increased stress on contralateral LE.    Status  Achieved            Plan - 08/19/19 1034    Clinical Impression Statement  strnegth has improved and goal met. ROM is WFL BIL. pt is pain limited and it varies daily and wil activity that effects gait. pt has switched braces which verb significant decrease in pain but still has difficulty getting brace tight enough. pt needs cuing for speed and control of ex. rest needed d/t deconditioning.    PT Treatment/Interventions  ADLs/Self Care Home Management;Moist Heat;Electrical Stimulation;Cryotherapy;Gait training;Stair training;Functional mobility training;Therapeutic activities;Therapeutic exercise;Balance training;Neuromuscular re-education;Patient/family education;Manual techniques;Taping    PT Next Visit Plan  pt to see MD today. prepare for D/C as pt is headed to [facility] for extended stay 08/28/19       Patient will benefit from skilled therapeutic intervention in order to improve the following deficits and impairments:  Decreased range of motion,  Pain, Decreased strength  Visit Diagnosis: Acute pain of left knee  Other abnormalities of gait and mobility     Problem List Patient Active Problem List   Diagnosis Date Noted  . Acute pain of left knee 06/07/2019  . Prediabetes 05/10/2019  . Glaucoma of both eyes 05/10/2019  . Essential hypertension 05/10/2019  . Dry scalp 05/10/2019  . Age-related osteoporosis without current pathological fracture 05/10/2019  . Seborrheic keratosis 01/11/2019  . Rosacea 01/11/2019  . Family history of colon cancer 01/11/2019  . Edema 08/27/2018  . Primary osteoarthritis 08/27/2018  . Sciatica of right side 08/27/2018  . Depression, major, single episode, complete remission ([place]) 08/27/2018  . Vitamin D deficiency 08/27/2018  . Mixed hyperlipidemia 08/27/2018  . Anxiety 08/27/2018  . Hyperglycemia 08/27/2018  . HTN (hypertension), benign 08/27/2018    [name] Cathers,ANGIE PTA 08/19/2019, 10:46 AM  [place] [place], [name], [address] Phone: [phone]   Fax:  [phone]  Name: Kristi Hunter MRN: [id number] Date of Birth: 13-Jul-1941

## 2019-08-24 ENCOUNTER — Other Ambulatory Visit: Payer: Self-pay

## 2019-08-24 ENCOUNTER — Ambulatory Visit: Payer: Medicare HMO | Admitting: Physical Therapy

## 2019-08-24 ENCOUNTER — Encounter: Payer: Self-pay | Admitting: Physical Therapy

## 2019-08-24 DIAGNOSIS — M25562 Pain in left knee: Secondary | ICD-10-CM

## 2019-08-24 DIAGNOSIS — R2689 Other abnormalities of gait and mobility: Secondary | ICD-10-CM | POA: Diagnosis not present

## 2019-08-24 NOTE — Therapy (Signed)
[address] [place], [name], [address] Phone: [phone]   Fax:  [phone]  Physical Therapy Treatment  Patient Details  Name: Kristi Hunter MRN: [id number] Date of Birth: 1941/05/10 Referring Provider (PT): Clearance Coots MD   Encounter Date: 08/24/2019  PT End of Session - 08/24/19 1100    Visit Number  18    Date for PT Re-Evaluation  08/27/19    PT Start Time  8891    PT Stop Time  1100    PT Time Calculation (min)  45 min    Behavior During Therapy  [facility] for tasks assessed/performed       Past Medical History:  Diagnosis Date  . Arthritis   . Cancer ([place])    uterine  . Chicken pox   . Chronic kidney disease   . Emphysema of lung ([place])   . Glaucoma   . Hypertension   . Urine incontinence   . UTI (urinary tract infection)     Past Surgical History:  Procedure Laterality Date  . ABDOMINAL HYSTERECTOMY  2011  . REPLACEMENT TOTAL KNEE Right 2012    There were no vitals filed for this visit.  Subjective Assessment - 08/24/19 1019    Subjective  "So much better, He gave me a shot"    Currently in Pain?  Yes    Pain Score  1     Pain Location  Knee                       [facility] Adult PT Treatment/Exercise - 08/24/19 0001      Knee/Hip Exercises: Aerobic   Nustep  L5 x 7 min      Knee/Hip Exercises: Machines for Strengthening   Cybex Knee Extension  10# 3sets 10    Cybex Knee Flexion  35# 2 sets 15      Knee/Hip Exercises: Standing   Forward Step Up  Left;Right;10 reps;Step Height: 6";Hand Hold: 2;2 sets    Walking with Sports Cord  resisted gait 4 way 30# x 3, 3 seated rest breaks required       Knee/Hip Exercises: Seated   Long Arc Quad  Strengthening;Left;2 sets;10 reps;Weights   2 sec hold   Long Arc Quad Weight  5 lbs.    Sit to Sand  2 sets;without UE support;15 reps   yellow ball airex on mat               PT Short Term Goals - 08/03/19  1037      PT SHORT TERM GOAL #2   Title  Pt will demonstrate 85 degrees L knee flexion and 0 degrees L knee extension in order to demonstrate improved functional ROM to decrease stress on surrounding musculature and to match the RLE.    Status  Achieved        PT Long Term Goals - 08/19/19 1034      PT LONG TERM GOAL #1   Title  Pt will demonstrate 4/5 strength Bil hip flexion/abd and 5/5 strength L knee extension in order to demonstrate improved functional strength.    Status  Achieved      PT LONG TERM GOAL #2   Title  Pt will report pain 3/10 or less within 5 weeks in order to demonstrate a statistically significant decrease in pain in the LLE to improve quality of life and decrease risk for immobility.    Baseline  pain  varies from day to day    Status  Partially Met      PT LONG TERM GOAL #3   Title  Pt will ambulate with LRAD with fluid steps equal stride and step length BIL w/o hip hiking or antalgic gait within 6 weeks to decrease increased stress on contralateral LE.    Status  Achieved            Plan - 08/24/19 1100    Clinical Impression Statement  Pt reports receiving an injection I her L knee and that has really helped. She was able to complete today's interventions without knee brace. Some instability noted with resisted side step. Cues to control gait speed during the eccentric phase of resisted forward walking. Pt is very deconditioned needing multiple rest breaks some times in the middle of sets.    Comorbidities  Arthritis, osteoporosis, R TKA, hx cancer, CKD, HTN    Rehab Potential  Good    PT Frequency  2x / week    PT Treatment/Interventions  ADLs/Self Care Home Management;Moist Heat;Electrical Stimulation;Cryotherapy;Gait training;Stair training;Functional mobility training;Therapeutic activities;Therapeutic exercise;Balance training;Neuromuscular re-education;Patient/family education;Manual techniques;Taping    PT Next Visit Plan  prepare for D/C as pt is  headed to [place] for extended stay 08/28/19       Patient will benefit from skilled therapeutic intervention in order to improve the following deficits and impairments:  Decreased range of motion, Pain, Decreased strength  Visit Diagnosis: Other abnormalities of gait and mobility  Acute pain of left knee     Problem List Patient Active Problem List   Diagnosis Date Noted  . OA (osteoarthritis) of knee 06/07/2019  . Prediabetes 05/10/2019  . Glaucoma of both eyes 05/10/2019  . Essential hypertension 05/10/2019  . Dry scalp 05/10/2019  . Age-related osteoporosis without current pathological fracture 05/10/2019  . Seborrheic keratosis 01/11/2019  . Rosacea 01/11/2019  . Family history of colon cancer 01/11/2019  . Edema 08/27/2018  . Primary osteoarthritis 08/27/2018  . Sciatica of right side 08/27/2018  . Depression, major, single episode, complete remission (HCC) 08/27/2018  . Vitamin D deficiency 08/27/2018  . Mixed hyperlipidemia 08/27/2018  . Anxiety 08/27/2018  . Hyperglycemia 08/27/2018  . HTN (hypertension), benign 08/27/2018    [name], PTA 08/24/2019, 11:03 AM  [facility] [address] [place], [place], [address] Phone: [phone]   Fax:  [phone]  Name: Kristi Hunter MRN: [id number] Date of Birth: 08/17/1940   

## 2019-08-26 ENCOUNTER — Ambulatory Visit: Payer: Medicare HMO | Admitting: Physical Therapy

## 2019-09-07 DIAGNOSIS — M79674 Pain in right toe(s): Secondary | ICD-10-CM | POA: Diagnosis not present

## 2019-09-07 DIAGNOSIS — M79675 Pain in left toe(s): Secondary | ICD-10-CM | POA: Diagnosis not present

## 2019-09-07 DIAGNOSIS — M79672 Pain in left foot: Secondary | ICD-10-CM | POA: Diagnosis not present

## 2019-09-07 DIAGNOSIS — L600 Ingrowing nail: Secondary | ICD-10-CM | POA: Diagnosis not present

## 2019-09-07 DIAGNOSIS — B351 Tinea unguium: Secondary | ICD-10-CM | POA: Diagnosis not present

## 2019-09-07 DIAGNOSIS — L748 Other eccrine sweat disorders: Secondary | ICD-10-CM | POA: Diagnosis not present

## 2019-09-23 ENCOUNTER — Encounter: Payer: Self-pay | Admitting: Family Medicine

## 2019-09-24 ENCOUNTER — Other Ambulatory Visit: Payer: Self-pay | Admitting: Family Medicine

## 2019-09-24 DIAGNOSIS — G4709 Other insomnia: Secondary | ICD-10-CM

## 2019-09-24 DIAGNOSIS — I10 Essential (primary) hypertension: Secondary | ICD-10-CM

## 2019-09-29 NOTE — Telephone Encounter (Signed)
Called pt to schedule an appointment, no answer LMTCB asked patient if she could call back to schedule her follow up appointment soon.

## 2019-10-01 ENCOUNTER — Ambulatory Visit: Payer: Medicare HMO

## 2019-10-03 ENCOUNTER — Ambulatory Visit: Payer: Medicare HMO | Attending: Internal Medicine

## 2019-10-03 DIAGNOSIS — Z23 Encounter for immunization: Secondary | ICD-10-CM | POA: Insufficient documentation

## 2019-10-03 NOTE — Progress Notes (Signed)
   Covid-19 Vaccination Clinic  Name:  Tolulope Woolbert    MRN: [id number] DOB: [birth year]  10/03/2019  Ms. Sylva was observed post Covid-19 immunization for 15 minutes without incidence. She was provided with Vaccine Information Sheet and instruction to access the V-Safe system.   Ms. Levi was instructed to call 911 with any severe reactions post vaccine: Marland Kitchen Difficulty breathing  . Swelling of your face and throat  . A fast heartbeat  . A bad rash all over your body  . Dizziness and weakness    Immunizations Administered    Name Date Dose VIS Date Route   Pfizer COVID-19 Vaccine 10/03/2019  8:28 AM 0.3 mL 07/23/2019 Intramuscular   Manufacturer: [place]   Lot: [id number]   [place]: [id number]

## 2019-10-05 DIAGNOSIS — H401131 Primary open-angle glaucoma, bilateral, mild stage: Secondary | ICD-10-CM | POA: Diagnosis not present

## 2019-10-05 DIAGNOSIS — H04123 Dry eye syndrome of bilateral lacrimal glands: Secondary | ICD-10-CM | POA: Diagnosis not present

## 2019-10-05 DIAGNOSIS — Z961 Presence of intraocular lens: Secondary | ICD-10-CM | POA: Diagnosis not present

## 2019-10-11 ENCOUNTER — Other Ambulatory Visit: Payer: Self-pay

## 2019-10-12 ENCOUNTER — Encounter: Payer: Self-pay | Admitting: Family Medicine

## 2019-10-12 ENCOUNTER — Ambulatory Visit (INDEPENDENT_AMBULATORY_CARE_PROVIDER_SITE_OTHER): Payer: Medicare HMO | Admitting: Family Medicine

## 2019-10-12 VITALS — BP 122/80 | HR 82 | Temp 97.8°F | Ht 64.0 in | Wt 268.4 lb

## 2019-10-12 DIAGNOSIS — R0683 Snoring: Secondary | ICD-10-CM | POA: Diagnosis not present

## 2019-10-12 DIAGNOSIS — I10 Essential (primary) hypertension: Secondary | ICD-10-CM

## 2019-10-12 DIAGNOSIS — R351 Nocturia: Secondary | ICD-10-CM | POA: Diagnosis not present

## 2019-10-12 LAB — BASIC METABOLIC PANEL
BUN: 19 mg/dL (ref 6–23)
CO2: 30 mEq/L (ref 19–32)
Calcium: 9.1 mg/dL (ref 8.4–10.5)
Chloride: 99 mEq/L (ref 96–112)
Creatinine, Ser: 0.64 mg/dL (ref 0.40–1.20)
GFR: 90.18 mL/min (ref >60.00)
Glucose, Bld: 98 mg/dL (ref 70–99)
Potassium: 3.9 mEq/L (ref 3.5–5.1)
Sodium: 135 mEq/L (ref 135–145)

## 2019-10-12 NOTE — Progress Notes (Signed)
Established Patient Office Visit  Subjective:  Patient ID: Kristi Hunter, female    DOB: 19-Jun-1941  Age: 79 y.o. MRN: [id number]  CC:  Chief Complaint  Patient presents with  . Hypertension    3 month follow up    HPI Kristi Hunter presents for nocturia with night time incontinence.  Patient has tried fluid restriction several hours before bedtime without relief.  She has tried multiple medications.  She is try the Kegel exercises.  Using 25 mg trazodone as needed with some benefit but was hoping that her sleep could be better.  The nocturia is also keeping her up nightly.  She does admit to snoring.  Denies apnea.  Blood pressure is well controlled with the chlorthalidone.  Past Medical History:  Diagnosis Date  . Arthritis   . Cancer ([place])    uterine  . Chicken pox   . Chronic kidney disease   . Emphysema of lung ([place])   . Glaucoma   . Hypertension   . Urine incontinence   . UTI (urinary tract infection)     Past Surgical History:  Procedure Laterality Date  . ABDOMINAL HYSTERECTOMY  2011  . REPLACEMENT TOTAL KNEE Right 2012    Family History  Problem Relation Age of Onset  . Cancer Father        skin  . Alcohol abuse Father   . Asthma Father   . COPD Father   . Heart attack Father   . Heart disease Father   . Cancer Maternal Grandfather 79       colon cancer  . Heart attack Mother   . Heart disease Mother   . Hyperlipidemia Mother   . Hypertension Mother   . Stroke Mother   . Miscarriages / [name] Mother   . Alcohol abuse Brother   . Arthritis Brother   . Drug abuse Brother   . Depression Daughter   . Hypertension Daughter   . Mental illness Daughter   . Depression Son   . Arthritis Maternal Grandmother   . Diabetes Maternal Grandmother   . Hyperlipidemia Maternal Grandmother   . Alcohol abuse Paternal Grandfather   . Arthritis Paternal Grandfather   . Cirrhosis Paternal Grandfather   . Alcohol abuse Brother   . Breast cancer  Paternal Aunt     Social History   Socioeconomic History  . Marital status: Widowed    Spouse name: Not on file  . Number of children: 3  . Years of education: Not on file  . Highest education level: Not on file  Occupational History    Comment: Retired Engineer, maintenance (IT)  Tobacco Use  . Smoking status: Former Smoker    Types: Cigarettes    Quit date: 08/12/1982    Years since quitting: 37.1  . Smokeless tobacco: Never Used  Substance and Sexual Activity  . Alcohol use: Yes    Alcohol/week: 1.0 standard drinks    Types: 1 Glasses of wine per week    Comment: every 1-2 wks.   . Drug use: Never  . Sexual activity: Not on file  Other Topics Concern  . Not on file  Social History Narrative  . Not on file   Social Determinants of Health   Financial Resource Strain:   . Difficulty of Paying Living Expenses: Not on file  Food Insecurity:   . Worried About Charity fundraiser in the Last Year: Not on file  . Ran Out of Food in the Last Year: Not on file  Transportation Needs:   . Film/video editor (Medical): Not on file  . Lack of Transportation (Non-Medical): Not on file  Physical Activity:   . Days of Exercise per Week: Not on file  . Minutes of Exercise per Session: Not on file  Stress:   . Feeling of Stress : Not on file  Social Connections:   . Frequency of Communication with Friends and Family: Not on file  . Frequency of Social Gatherings with Friends and Family: Not on file  . Attends Religious Services: Not on file  . Active Member of Clubs or Organizations: Not on file  . Attends Archivist Meetings: Not on file  . Marital Status: Not on file  Intimate Partner Violence:   . Fear of Current or Ex-Partner: Not on file  . Emotionally Abused: Not on file  . Physically Abused: Not on file  . Sexually Abused: Not on file    Outpatient Medications Prior to Visit  Medication Sig Dispense Refill  . aspirin 81 MG chewable tablet Chew by mouth daily.    . Calcium  Carb-Cholecalciferol (CALCIUM 600 + D PO) Take by mouth.    . chlorthalidone (HYGROTON) 25 MG tablet Take 1 tablet (25 mg total) by mouth daily. 90 tablet 0  . Cholecalciferol (VITAMIN D3 PO) Take 50 mcg by mouth.    Marland Kitchen GLUCOSAMINE-CHONDROITIN-VIT D3 PO Take 2,000 Units by mouth.    Marland Kitchen ketoconazole (NIZORAL) 2 % shampoo Apply 1 application topically 2 (two) times a week. 120 mL 2  . Melatonin 10 MG TABS Take by mouth.    . metroNIDAZOLE (METROGEL) 0.75 % gel Apply 1 application topically 2 (two) times daily. 45 g 1  . Multiple Vitamin (MULTIVITAMIN) tablet Take 1 tablet by mouth daily.    . naproxen sodium (ALEVE) 220 MG tablet Take 220 mg by mouth.    . traZODone (DESYREL) 50 MG tablet TAKE 0.5 TABLETS (25 MG TOTAL) BY MOUTH AT BEDTIME AS NEEDED FOR SLEEP. (Patient not taking: Reported on 10/12/2019) 45 tablet 1   No facility-administered medications prior to visit.    Allergies  Allergen Reactions  . Percocet [Oxycodone-Acetaminophen]     ROS Review of Systems  Constitutional: Negative.   HENT: Negative.   Respiratory: Negative.   Cardiovascular: Negative.   Gastrointestinal: Negative.   Genitourinary: Positive for dysuria and frequency. Negative for difficulty urinating and urgency.      Objective:    Physical Exam  Constitutional: She is oriented to person, place, and time. She appears well-developed and well-nourished. No distress.  HENT:  Head: Normocephalic and atraumatic.  Right Ear: External ear normal.  Left Ear: External ear normal.  Mouth/Throat: Oropharynx is clear and moist. No oropharyngeal exudate.    Eyes: Conjunctivae are normal. Right eye exhibits no discharge. Left eye exhibits no discharge. No scleral icterus.  Neck: No JVD present. No tracheal deviation present.  Cardiovascular: Normal rate, regular rhythm and normal heart sounds.  Pulmonary/Chest: Effort normal and breath sounds normal. No stridor.  Musculoskeletal:        General: No edema.    Neurological: She is alert and oriented to person, place, and time.  Skin: She is not diaphoretic.  Psychiatric: She has a normal mood and affect. Her behavior is normal.    BP 122/80 (BP Location: Left Arm, Patient Position: Sitting, Cuff Size: Large)   Pulse 82   Temp 97.8 F (36.6 C) (Temporal)   Ht 5\' 4"  (1.626 m)   Wt 268 lb  6.4 oz (121.7 kg)   SpO2 100%   BMI 46.07 kg/m  Wt Readings from Last 3 Encounters:  10/12/19 268 lb 6.4 oz (121.7 kg)  08/19/19 259 lb (117.5 kg)  07/29/19 267 lb (121.1 kg)     Health Maintenance Due  Topic Date Due  . TETANUS/TDAP  08/04/1962  . PNA vac Low Risk Adult (1 of 2 - PCV13) 08/04/2008    There are no preventive care reminders to display for this patient.  Lab Results  Component Value Date   TSH 1.71 01/13/2019   Lab Results  Component Value Date   WBC 6.9 05/10/2019   HGB 12.4 05/10/2019   HCT 38.6 05/10/2019   MCV 83.6 05/10/2019   PLT 306.0 05/10/2019   Lab Results  Component Value Date   NA 141 05/10/2019   K 3.8 05/10/2019   CO2 32 05/10/2019   GLUCOSE 90 05/10/2019   BUN 21 05/10/2019   CREATININE 0.58 05/10/2019   BILITOT 0.4 01/13/2019   ALKPHOS 73 01/13/2019   AST 19 01/13/2019   ALT 19 01/13/2019   PROT 7.2 01/13/2019   ALBUMIN 3.6 01/13/2019   CALCIUM 9.3 05/10/2019   GFR 101.14 05/10/2019   Lab Results  Component Value Date   CHOL 151 01/13/2019   Lab Results  Component Value Date   HDL 44.90 01/13/2019   Lab Results  Component Value Date   LDLCALC 92 01/13/2019   Lab Results  Component Value Date   TRIG 72.0 01/13/2019   Lab Results  Component Value Date   CHOLHDL 3 01/13/2019   Lab Results  Component Value Date   HGBA1C 5.9 05/10/2019      Assessment & Plan:   Problem List Items Addressed This Visit      Cardiovascular and Mediastinum   Essential hypertension   Relevant Orders   Urinalysis, Routine w reflex microscopic   Basic metabolic panel     Other   Snores    Relevant Orders   Ambulatory referral to Sleep Studies   Nocturia - Primary   Relevant Orders   Ambulatory referral to Urology   Urinalysis, Routine w reflex microscopic      No orders of the defined types were placed in this encounter.   Follow-up: Return in about 3 months (around 01/12/2020).   Agrees to go for urology and pulmonary consultations or nocturia with incontinence and possible apnea.  She will increase the trazodone to the full 50 mg pill nightly. Libby Maw, MD

## 2019-10-12 NOTE — Patient Instructions (Signed)
Sleep Apnea Sleep apnea is a condition in which breathing pauses or becomes shallow during sleep. Episodes of sleep apnea usually last 10 seconds or longer, and they may occur as many as 20 times an hour. Sleep apnea disrupts your sleep and keeps your body from getting the rest that it needs. This condition can increase your risk of certain health problems, including:  Heart attack.  Stroke.  Obesity.  Diabetes.  Heart failure.  Irregular heartbeat. What are the causes? There are three kinds of sleep apnea:  Obstructive sleep apnea. This kind is caused by a blocked or collapsed airway.  Central sleep apnea. This kind happens when the part of the brain that controls breathing does not send the correct signals to the muscles that control breathing.  Mixed sleep apnea. This is a combination of obstructive and central sleep apnea. The most common cause of this condition is a collapsed or blocked airway. An airway can collapse or become blocked if:  Your throat muscles are abnormally relaxed.  Your tongue and tonsils are larger than normal.  You are overweight.  Your airway is smaller than normal. What increases the risk? You are more likely to develop this condition if you:  Are overweight.  Smoke.  Have a smaller than normal airway.  Are elderly.  Are female.  Drink alcohol.  Take sedatives or tranquilizers.  Have a family history of sleep apnea. What are the signs or symptoms? Symptoms of this condition include:  Trouble staying asleep.  Daytime sleepiness and tiredness.  Irritability.  Loud snoring.  Morning headaches.  Trouble concentrating.  Forgetfulness.  Decreased interest in sex.  Unexplained sleepiness.  Mood swings.  Personality changes.  Feelings of depression.  Waking up often during the night to urinate.  Dry mouth.  Sore throat. How is this diagnosed? This condition may be diagnosed with:  A medical history.  A physical  exam.  A series of tests that are done while you are sleeping (sleep study). These tests are usually done in a sleep lab, but they may also be done at home. How is this treated? Treatment for this condition aims to restore normal breathing and to ease symptoms during sleep. It may involve managing health issues that can affect breathing, such as high blood pressure or obesity. Treatment may include:  Sleeping on your side.  Using a decongestant if you have nasal congestion.  Avoiding the use of depressants, including alcohol, sedatives, and narcotics.  Losing weight if you are overweight.  Making changes to your diet.  Quitting smoking.  Using a device to open your airway while you sleep, such as: ? An oral appliance. This is a custom-made mouthpiece that shifts your lower jaw forward. ? A continuous positive airway pressure (CPAP) device. This device blows air through a mask when you breathe out (exhale). ? A nasal expiratory positive airway pressure (EPAP) device. This device has valves that you put into each nostril. ? A bi-level positive airway pressure (BPAP) device. This device blows air through a mask when you breathe in (inhale) and breathe out (exhale).  Having surgery if other treatments do not work. During surgery, excess tissue is removed to create a wider airway. It is important to get treatment for sleep apnea. Without treatment, this condition can lead to:  High blood pressure.  Coronary artery disease.  In men, an inability to achieve or maintain an erection (impotence).  Reduced thinking abilities. Follow these instructions at home: Lifestyle  Make any lifestyle changes   that your health care provider recommends.  Eat a healthy, well-balanced diet.  Take steps to lose weight if you are overweight.  Avoid using depressants, including alcohol, sedatives, and narcotics.  Do not use any products that contain nicotine or tobacco, such as cigarettes,  e-cigarettes, and chewing tobacco. If you need help quitting, ask your health care provider. General instructions  Take over-the-counter and prescription medicines only as told by your health care provider.  If you were given a device to open your airway while you sleep, use it only as told by your health care provider.  If you are having surgery, make sure to tell your health care provider you have sleep apnea. You may need to bring your device with you.  Keep all follow-up visits as told by your health care provider. This is important. Contact a health care provider if:  The device that you received to open your airway during sleep is uncomfortable or does not seem to be working.  Your symptoms do not improve.  Your symptoms get worse. Get help right away if:  You develop: ? Chest pain. ? Shortness of breath. ? Discomfort in your back, arms, or stomach.  You have: ? Trouble speaking. ? Weakness on one side of your body. ? Drooping in your face. These symptoms may represent a serious problem that is an emergency. Do not wait to see if the symptoms will go away. Get medical help right away. Call your local emergency services (911 in the [place] Do not drive yourself to the hospital. Summary  Sleep apnea is a condition in which breathing pauses or becomes shallow during sleep.  The most common cause is a collapsed or blocked airway.  The goal of treatment is to restore normal breathing and to ease symptoms during sleep. This information is not intended to replace advice given to you by your health care provider. Make sure you discuss any questions you have with your health care provider. Document Revised: 01/13/2019 Document Reviewed: 03/24/2018 Elsevier Patient Education  2020 Elsevier Inc.  

## 2019-10-13 ENCOUNTER — Other Ambulatory Visit: Payer: Medicare HMO

## 2019-10-13 ENCOUNTER — Other Ambulatory Visit: Payer: Self-pay

## 2019-10-13 NOTE — Addendum Note (Signed)
Addended by: Lynnea Ferrier on: 10/13/2019 04:13 PM   Modules accepted: Orders

## 2019-10-14 LAB — URINALYSIS, ROUTINE W REFLEX MICROSCOPIC
Bilirubin Urine: NEGATIVE
Hgb urine dipstick: NEGATIVE
Ketones, ur: NEGATIVE
Leukocytes,Ua: NEGATIVE
Nitrite: NEGATIVE
RBC / HPF: NONE SEEN (ref 0–?)
Specific Gravity, Urine: 1.02 (ref 1.000–1.030)
Total Protein, Urine: NEGATIVE
Urine Glucose: NEGATIVE
Urobilinogen, UA: 0.2 (ref 0.0–1.0)
WBC, UA: NONE SEEN (ref 0–?)
pH: 7 (ref 5.0–8.0)

## 2019-10-19 ENCOUNTER — Other Ambulatory Visit: Payer: Self-pay | Admitting: Family Medicine

## 2019-10-19 DIAGNOSIS — I10 Essential (primary) hypertension: Secondary | ICD-10-CM

## 2019-10-19 MED ORDER — CHLORTHALIDONE 25 MG PO TABS: 25.0000 mg | ORAL_TABLET | Freq: Every day | ORAL | 0 refills | Status: DC

## 2019-10-26 ENCOUNTER — Ambulatory Visit: Payer: Medicare HMO | Attending: Internal Medicine

## 2019-10-26 DIAGNOSIS — Z23 Encounter for immunization: Secondary | ICD-10-CM

## 2019-10-26 NOTE — Progress Notes (Signed)
   Covid-19 Vaccination Clinic  Name:  Kristi Hunter    MRN: [id number] DOB: June 28, 1941  10/26/2019  Kristi Hunter was observed post Covid-19 immunization for 15 minutes without incident. She was provided with Vaccine Information Sheet and instruction to access the V-Safe system.   Kristi Hunter was instructed to call 911 with any severe reactions post vaccine: Marland Kitchen Difficulty breathing  . Swelling of face and throat  . A fast heartbeat  . A bad rash all over body  . Dizziness and weakness   Immunizations Administered    Name Date Dose VIS Date Route   Pfizer COVID-19 Vaccine 10/26/2019  3:07 PM 0.3 mL 07/23/2019 Intramuscular   Manufacturer: [place]   Lot: [id number]   [place]: [id number]

## 2019-11-09 ENCOUNTER — Other Ambulatory Visit: Payer: Self-pay

## 2019-11-10 ENCOUNTER — Encounter: Payer: Self-pay | Admitting: Family Medicine

## 2019-11-10 ENCOUNTER — Ambulatory Visit (INDEPENDENT_AMBULATORY_CARE_PROVIDER_SITE_OTHER): Payer: Medicare HMO | Admitting: Family Medicine

## 2019-11-10 VITALS — BP 110/78 | HR 94 | Temp 98.4°F | Ht 64.0 in | Wt 268.6 lb

## 2019-11-10 DIAGNOSIS — M549 Dorsalgia, unspecified: Secondary | ICD-10-CM

## 2019-11-10 MED ORDER — NAPROXEN 500 MG PO TABS: 500.0000 mg | ORAL_TABLET | Freq: Two times a day (BID) | ORAL | 0 refills | Status: DC

## 2019-11-10 NOTE — Progress Notes (Signed)
Kristi Hunter is a 79 y.o. female  Chief Complaint  Patient presents with  . Back Pain    Pt c/o lower back pain x 2 weeks.  Pt can't stand up striaght.  Pt has been trying heating pads, ice packs and Aleve with no relief.    HPI: Kristi Hunter is a 79 y.o. female complains of B/L low back pain Rt > Lt x 2 wks - started 3/17. Pain comes and goes - mostly with position change (in/out of bed, going from sitting to standing). No injury, trauma, fall. She had sciatica in the past - this feels much different. Patient states she "always wakes up stiff" but this was different and pain increased as day went on. She notes difficulty standing up straight at times due to pain. Most comfortable position is laying in bed on Lt side with Rt leg over Lt leg. No radiation of pain. No numbness or tingling in LE. No change in bowel or bladder function.   Pt has tried heating pad, aleve without significant relief. She states she can tolerate aleve, advil without GI issue. She took 3 tabs of daughter's gabapentin.  Past Medical History:  Diagnosis Date  . Arthritis   . Cancer ([place])    uterine  . Chicken pox   . Chronic kidney disease   . Emphysema of lung ([place])   . Glaucoma   . Hypertension   . Urine incontinence   . UTI (urinary tract infection)     Past Surgical History:  Procedure Laterality Date  . ABDOMINAL HYSTERECTOMY  2011  . REPLACEMENT TOTAL KNEE Right 2012    Social History   Socioeconomic History  . Marital status: Widowed    Spouse name: Not on file  . Number of children: 3  . Years of education: Not on file  . Highest education level: Not on file  Occupational History    Comment: Retired Engineer, maintenance (IT)  Tobacco Use  . Smoking status: Former Smoker    Types: Cigarettes    Quit date: 08/12/1982    Years since quitting: 37.2  . Smokeless tobacco: Never Used  Substance and Sexual Activity  . Alcohol use: Yes    Alcohol/week: 1.0 standard drinks    Types: 1 Glasses of wine per  week    Comment: every 1-2 wks.   . Drug use: Never  . Sexual activity: Not on file  Other Topics Concern  . Not on file  Social History Narrative  . Not on file   Social Determinants of Health   Financial Resource Strain:   . Difficulty of Paying Living Expenses:   Food Insecurity:   . Worried About Charity fundraiser in the Last Year:   . Arboriculturist in the Last Year:   Transportation Needs:   . Film/video editor (Medical):   Marland Kitchen Lack of Transportation (Non-Medical):   Physical Activity:   . Days of Exercise per Week:   . Minutes of Exercise per Session:   Stress:   . Feeling of Stress :   Social Connections:   . Frequency of Communication with Friends and Family:   . Frequency of Social Gatherings with Friends and Family:   . Attends Religious Services:   . Active Member of Clubs or Organizations:   . Attends Archivist Meetings:   Marland Kitchen Marital Status:   Intimate Partner Violence:   . Fear of Current or Ex-Partner:   . Emotionally Abused:   Marland Kitchen Physically  Abused:   . Sexually Abused:     Family History  Problem Relation Age of Onset  . Cancer Father        skin  . Alcohol abuse Father   . Asthma Father   . COPD Father   . Heart attack Father   . Heart disease Father   . Cancer Maternal Grandfather 46       colon cancer  . Heart attack Mother   . Heart disease Mother   . Hyperlipidemia Mother   . Hypertension Mother   . Stroke Mother   . Miscarriages / [name] Mother   . Alcohol abuse Brother   . Arthritis Brother   . Drug abuse Brother   . Depression Daughter   . Hypertension Daughter   . Mental illness Daughter   . Depression Son   . Arthritis Maternal Grandmother   . Diabetes Maternal Grandmother   . Hyperlipidemia Maternal Grandmother   . Alcohol abuse Paternal Grandfather   . Arthritis Paternal Grandfather   . Cirrhosis Paternal Grandfather   . Alcohol abuse Brother   . Breast cancer Paternal Aunt      Immunization  History  Administered Date(s) Administered  . Fluad Quad(high Dose 65+) 05/25/2019  . Influenza,inj,Quad PF,6+ Mos 05/12/2018  . PFIZER SARS-COV-2 Vaccination 10/03/2019, 10/26/2019    Outpatient Encounter Medications as of 11/10/2019  Medication Sig  . aspirin 81 MG chewable tablet Chew by mouth daily.  . Calcium Carb-Cholecalciferol (CALCIUM 600 + D PO) Take by mouth.  . chlorthalidone (HYGROTON) 25 MG tablet Take 1 tablet (25 mg total) by mouth daily.  . Cholecalciferol (VITAMIN D3 PO) Take 50 mcg by mouth.  Marland Kitchen GLUCOSAMINE-CHONDROITIN-VIT D3 PO Take 2,000 Units by mouth.  Marland Kitchen ketoconazole (NIZORAL) 2 % shampoo Apply 1 application topically 2 (two) times a week.  . Melatonin 10 MG TABS Take by mouth.  . metroNIDAZOLE (METROGEL) 0.75 % gel Apply 1 application topically 2 (two) times daily.  . Multiple Vitamin (MULTIVITAMIN) tablet Take 1 tablet by mouth daily.  . naproxen sodium (ALEVE) 220 MG tablet Take 220 mg by mouth.  . traZODone (DESYREL) 50 MG tablet TAKE 0.5 TABLETS (25 MG TOTAL) BY MOUTH AT BEDTIME AS NEEDED FOR SLEEP.  . naproxen (NAPROSYN) 500 MG tablet Take 1 tablet (500 mg total) by mouth 2 (two) times daily with a meal.   No facility-administered encounter medications on file as of 11/10/2019.     ROS: Pertinent positives and negatives noted in HPI. Remainder of ROS non-contributory  Allergies  Allergen Reactions  . Percocet [Oxycodone-Acetaminophen]     BP 110/78 (BP Location: Left Arm, Patient Position: Sitting, Cuff Size: Large)   Pulse 94   Temp 98.4 F (36.9 C) (Temporal)   Ht 5\' 4"  (1.626 m)   Wt 268 lb 9.6 oz (121.8 kg)   SpO2 96%   BMI 46.11 kg/m   Physical Exam  Constitutional: She is oriented to person, place, and time. She appears well-developed and well-nourished. No distress.  Musculoskeletal:     Lumbar back: Tenderness present. No edema or bony tenderness. Normal range of motion.       Back:  Neurological: She is alert and oriented to person,  place, and time.  Psychiatric: She has a normal mood and affect. Her behavior is normal.     A/P:  1. Musculoskeletal back pain - cont with heating pad at least BID following by stretching exercises Rx: - naproxen (NAPROSYN) 500 MG tablet; Take 1 tablet (500  mg total) by mouth 2 (two) times daily with a meal.  Dispense: 30 tablet; Refill: 0 - Ambulatory referral to Physical Therapy - f/u PRN Discussed plan and reviewed medications with patient, including risks, benefits, and potential side effects. Pt expressed understand. All questions answered.    This visit occurred during the SARS-CoV-2 public health emergency.  Safety protocols were in place, including screening questions prior to the visit, additional usage of staff PPE, and extensive cleaning of exam room while observing appropriate contact time as indicated for disinfecting solutions.

## 2019-11-10 NOTE — Patient Instructions (Addendum)
Continue with heating pad 2-4x/day - 15-48min on then off After heating pad, do stretching/range of motion exercises - see below Take naproxen 500mg  1 tab twice per day with food x 5-7 days PT referral placed today      Back Exercises These exercises help to make your trunk and back strong. They also help to keep the lower back flexible. Doing these exercises can help to prevent back pain or lessen existing pain.  If you have back pain, try to do these exercises 2-3 times each day or as told by your doctor.  As you get better, do the exercises once each day. Repeat the exercises more often as told by your doctor.  To stop back pain from coming back, do the exercises once each day, or as told by your doctor. Exercises Single knee to chest Do these steps 3-5 times in a row for each leg: 1. Lie on your back on a firm bed or the floor with your legs stretched out. 2. Bring one knee to your chest. 3. Grab your knee or thigh with both hands and hold them it in place. 4. Pull on your knee until you feel a gentle stretch in your lower back or buttocks. 5. Keep doing the stretch for 10-30 seconds. 6. Slowly let go of your leg and straighten it. Pelvic tilt Do these steps 5-10 times in a row: 1. Lie on your back on a firm bed or the floor with your legs stretched out. 2. Bend your knees so they point up to the ceiling. Your feet should be flat on the floor. 3. Tighten your lower belly (abdomen) muscles to press your lower back against the floor. This will make your tailbone point up to the ceiling instead of pointing down to your feet or the floor. 4. Stay in this position for 5-10 seconds while you gently tighten your muscles and breathe evenly. Cat-cow Do these steps until your lower back bends more easily: 1. Get on your hands and knees on a firm surface. Keep your hands under your shoulders, and keep your knees under your hips. You may put padding under your knees. 2. Let your head hang  down toward your chest. Tighten (contract) the muscles in your belly. Point your tailbone toward the floor so your lower back becomes rounded like the back of a cat. 3. Stay in this position for 5 seconds. 4. Slowly lift your head. Let the muscles of your belly relax. Point your tailbone up toward the ceiling so your back forms a sagging arch like the back of a cow. 5. Stay in this position for 5 seconds.  Press-ups Do these steps 5-10 times in a row: 1. Lie on your belly (face-down) on the floor. 2. Place your hands near your head, about shoulder-width apart. 3. While you keep your back relaxed and keep your hips on the floor, slowly straighten your arms to raise the top half of your body and lift your shoulders. Do not use your back muscles. You may change where you place your hands in order to make yourself more comfortable. 4. Stay in this position for 5 seconds. 5. Slowly return to lying flat on the floor.  Bridges Do these steps 10 times in a row: 1. Lie on your back on a firm surface. 2. Bend your knees so they point up to the ceiling. Your feet should be flat on the floor. Your arms should be flat at your sides, next to your body. 3.  Tighten your butt muscles and lift your butt off the floor until your waist is almost as high as your knees. If you do not feel the muscles working in your butt and the back of your thighs, slide your feet 1-2 inches farther away from your butt. 4. Stay in this position for 3-5 seconds. 5. Slowly lower your butt to the floor, and let your butt muscles relax. If this exercise is too easy, try doing it with your arms crossed over your chest. Belly crunches Do these steps 5-10 times in a row: 1. Lie on your back on a firm bed or the floor with your legs stretched out. 2. Bend your knees so they point up to the ceiling. Your feet should be flat on the floor. 3. Cross your arms over your chest. 4. Tip your chin a little bit toward your chest but do not bend  your neck. 5. Tighten your belly muscles and slowly raise your chest just enough to lift your shoulder blades a tiny bit off of the floor. Avoid raising your body higher than that, because it can put too much stress on your low back. 6. Slowly lower your chest and your head to the floor. Back lifts Do these steps 5-10 times in a row: 1. Lie on your belly (face-down) with your arms at your sides, and rest your forehead on the floor. 2. Tighten the muscles in your legs and your butt. 3. Slowly lift your chest off of the floor while you keep your hips on the floor. Keep the back of your head in line with the curve in your back. Look at the floor while you do this. 4. Stay in this position for 3-5 seconds. 5. Slowly lower your chest and your face to the floor. Contact a doctor if:  Your back pain gets a lot worse when you do an exercise.  Your back pain does not get better 2 hours after you exercise. If you have any of these problems, stop doing the exercises. Do not do them again unless your doctor says it is okay. Get help right away if:  You have sudden, very bad back pain. If this happens, stop doing the exercises. Do not do them again unless your doctor says it is okay. This information is not intended to replace advice given to you by your health care provider. Make sure you discuss any questions you have with your health care provider. Document Revised: 04/23/2018 Document Reviewed: 04/23/2018 Elsevier Patient Education  2020 [employer].

## 2019-11-19 DIAGNOSIS — R350 Frequency of micturition: Secondary | ICD-10-CM | POA: Diagnosis not present

## 2019-11-19 DIAGNOSIS — N3946 Mixed incontinence: Secondary | ICD-10-CM | POA: Diagnosis not present

## 2019-11-19 DIAGNOSIS — R351 Nocturia: Secondary | ICD-10-CM | POA: Diagnosis not present

## 2019-11-22 DIAGNOSIS — M79671 Pain in right foot: Secondary | ICD-10-CM | POA: Diagnosis not present

## 2019-11-22 DIAGNOSIS — M79672 Pain in left foot: Secondary | ICD-10-CM | POA: Diagnosis not present

## 2019-11-22 DIAGNOSIS — L602 Onychogryphosis: Secondary | ICD-10-CM | POA: Diagnosis not present

## 2019-11-26 ENCOUNTER — Ambulatory Visit: Payer: Medicare HMO

## 2019-12-01 DIAGNOSIS — R69 Illness, unspecified: Secondary | ICD-10-CM | POA: Diagnosis not present

## 2019-12-02 ENCOUNTER — Encounter: Payer: Medicare HMO | Admitting: Physical Therapy

## 2019-12-03 ENCOUNTER — Telehealth: Payer: Self-pay | Admitting: Family Medicine

## 2019-12-03 NOTE — Telephone Encounter (Signed)
Patient is calling and wanted to speak to someone finding a Lymphedema  Therapy clinic. Patient stated that the response can be put in my chart.  CB is [phone]

## 2019-12-06 NOTE — Telephone Encounter (Signed)
Dr. [name] please advise message below any recommendations?

## 2019-12-07 NOTE — Telephone Encounter (Signed)
Recommendations sent via Mychart per patients request.

## 2019-12-07 NOTE — Telephone Encounter (Signed)
Support hose or compression stockings.

## 2019-12-10 DIAGNOSIS — R351 Nocturia: Secondary | ICD-10-CM | POA: Diagnosis not present

## 2019-12-10 DIAGNOSIS — M62838 Other muscle spasm: Secondary | ICD-10-CM | POA: Diagnosis not present

## 2019-12-10 DIAGNOSIS — R350 Frequency of micturition: Secondary | ICD-10-CM | POA: Diagnosis not present

## 2019-12-10 DIAGNOSIS — M6289 Other specified disorders of muscle: Secondary | ICD-10-CM | POA: Diagnosis not present

## 2019-12-10 DIAGNOSIS — N3946 Mixed incontinence: Secondary | ICD-10-CM | POA: Diagnosis not present

## 2019-12-10 DIAGNOSIS — M6281 Muscle weakness (generalized): Secondary | ICD-10-CM | POA: Diagnosis not present

## 2019-12-24 DIAGNOSIS — R69 Illness, unspecified: Secondary | ICD-10-CM | POA: Diagnosis not present

## 2019-12-27 DIAGNOSIS — R3915 Urgency of urination: Secondary | ICD-10-CM | POA: Diagnosis not present

## 2019-12-27 DIAGNOSIS — M62838 Other muscle spasm: Secondary | ICD-10-CM | POA: Diagnosis not present

## 2019-12-27 DIAGNOSIS — R351 Nocturia: Secondary | ICD-10-CM | POA: Diagnosis not present

## 2019-12-27 DIAGNOSIS — M6289 Other specified disorders of muscle: Secondary | ICD-10-CM | POA: Diagnosis not present

## 2019-12-27 DIAGNOSIS — M6281 Muscle weakness (generalized): Secondary | ICD-10-CM | POA: Diagnosis not present

## 2019-12-27 DIAGNOSIS — N3946 Mixed incontinence: Secondary | ICD-10-CM | POA: Diagnosis not present

## 2020-01-04 DIAGNOSIS — N3946 Mixed incontinence: Secondary | ICD-10-CM | POA: Diagnosis not present

## 2020-01-04 DIAGNOSIS — R350 Frequency of micturition: Secondary | ICD-10-CM | POA: Diagnosis not present

## 2020-01-04 DIAGNOSIS — R3915 Urgency of urination: Secondary | ICD-10-CM | POA: Diagnosis not present

## 2020-01-04 DIAGNOSIS — R351 Nocturia: Secondary | ICD-10-CM | POA: Diagnosis not present

## 2020-01-06 DIAGNOSIS — M6281 Muscle weakness (generalized): Secondary | ICD-10-CM | POA: Diagnosis not present

## 2020-01-06 DIAGNOSIS — M6289 Other specified disorders of muscle: Secondary | ICD-10-CM | POA: Diagnosis not present

## 2020-01-06 DIAGNOSIS — R3915 Urgency of urination: Secondary | ICD-10-CM | POA: Diagnosis not present

## 2020-01-06 DIAGNOSIS — N3946 Mixed incontinence: Secondary | ICD-10-CM | POA: Diagnosis not present

## 2020-01-06 DIAGNOSIS — M62838 Other muscle spasm: Secondary | ICD-10-CM | POA: Diagnosis not present

## 2020-01-06 DIAGNOSIS — R351 Nocturia: Secondary | ICD-10-CM | POA: Diagnosis not present

## 2020-01-17 ENCOUNTER — Other Ambulatory Visit: Payer: Self-pay

## 2020-01-18 ENCOUNTER — Ambulatory Visit (INDEPENDENT_AMBULATORY_CARE_PROVIDER_SITE_OTHER): Payer: Medicare HMO | Admitting: Family Medicine

## 2020-01-18 ENCOUNTER — Encounter: Payer: Self-pay | Admitting: Family Medicine

## 2020-01-18 VITALS — BP 118/74 | HR 78 | Temp 98.1°F | Ht 64.0 in | Wt 272.8 lb

## 2020-01-18 DIAGNOSIS — I10 Essential (primary) hypertension: Secondary | ICD-10-CM | POA: Diagnosis not present

## 2020-01-18 DIAGNOSIS — M25562 Pain in left knee: Secondary | ICD-10-CM

## 2020-01-18 DIAGNOSIS — M810 Age-related osteoporosis without current pathological fracture: Secondary | ICD-10-CM | POA: Diagnosis not present

## 2020-01-18 DIAGNOSIS — G4709 Other insomnia: Secondary | ICD-10-CM

## 2020-01-18 DIAGNOSIS — M549 Dorsalgia, unspecified: Secondary | ICD-10-CM | POA: Diagnosis not present

## 2020-01-18 DIAGNOSIS — R0681 Apnea, not elsewhere classified: Secondary | ICD-10-CM

## 2020-01-18 MED ORDER — TRAZODONE HCL 50 MG PO TABS: 25.0000 mg | ORAL_TABLET | Freq: Every evening | ORAL | 2 refills | Status: DC | PRN

## 2020-01-18 MED ORDER — NAPROXEN 500 MG PO TABS: ORAL_TABLET | ORAL | 0 refills | Status: DC

## 2020-01-18 MED ORDER — TRAZODONE HCL 50 MG PO TABS: 50.0000 mg | ORAL_TABLET | Freq: Every day | ORAL | 1 refills | Status: DC

## 2020-01-18 MED ORDER — DENOSUMAB 60 MG/ML ~~LOC~~ SOSY: 60.0000 mg | PREFILLED_SYRINGE | Freq: Once | SUBCUTANEOUS | 0 refills | Status: AC

## 2020-01-18 NOTE — Patient Instructions (Signed)
Chronic Knee Pain, Adult Chronic knee pain is pain in one or both knees that lasts longer than 3 months. Symptoms of chronic knee pain may include swelling, stiffness, and discomfort. Age-related wear and tear (osteoarthritis) of the knee joint is the most common cause of chronic knee pain. Other possible causes include:  A long-term immune-related disease that causes inflammation of the knee (rheumatoid arthritis). This usually affects both knees.  Inflammatory arthritis, such as gout or pseudogout.  An injury to the knee that causes arthritis.  An injury to the knee that damages the ligaments. Ligaments are strong tissues that connect bones to each other.  Runner's knee or pain behind the kneecap. Treatment for chronic knee pain depends on the cause. The main treatments for chronic knee pain are physical therapy and weight loss. This condition may also be treated with medicines, injections, a knee sleeve or brace, and by using crutches. Rest, ice, compression (pressure), and elevation (RICE) therapy may also be recommended. Follow these instructions at home: If you have a knee sleeve or brace:   Wear it as told by your health care provider. Remove it only as told by your health care provider.  Loosen it if your toes tingle, become numb, or turn cold and blue.  Keep it clean.  If the sleeve or brace is not waterproof: ? Do not let it get wet. ? Remove it if allowed by your health care provider, or cover it with a watertight covering when you take a bath or a shower. Managing pain, stiffness, and swelling      If directed, apply heat to the affected area as often as told by your health care provider. Use the heat source that your health care provider recommends, such as a moist heat pack or a heating pad. ? If you have a removable sleeve or brace, remove it as told by your health care provider. ? Place a towel between your skin and the heat source. ? Leave the heat on for 20-30  minutes. ? Remove the heat if your skin turns bright red. This is especially important if you are unable to feel pain, heat, or cold. You may have a greater risk of getting burned.  If directed, put ice on the affected area. ? If you have a removable sleeve or brace, remove it as told by your health care provider. ? Put ice in a plastic bag. ? Place a towel between your skin and the bag. ? Leave the ice on for 20 minutes, 2-3 times a day.  Move your toes often to reduce stiffness and swelling.  Raise (elevate) the injured area above the level of your heart while you are sitting or lying down. Activity  Avoid activities where both feet leave the ground at the same time (high-impact activities). Examples are running, jumping rope, and doing jumping jacks.  Return to your normal activities as told by your health care provider. Ask your health care provider what activities are safe for you.  Follow the exercise plan that your health care provider designed for you. Your health care provider may suggest that you: ? Avoid activities that make knee pain worse. This may require you to change your exercise routines, sport participation, or job duties. ? Wear shoes with cushioned soles. ? Avoid high-impact activities or sports that require running and sudden changes in direction. ? Do physical therapy as told by your health care provider. Physical therapy is planned to match your needs and abilities. It may include  exercises for strength, flexibility, stability, and endurance. ? Do exercises that increase balance and strength, such as tai chi and yoga.  Do not use the injured limb to support your body weight until your health care provider says that you can. Use crutches, a cane, or a walker, as told by your health care provider. General instructions  Take over-the-counter and prescription medicines only as told by your health care provider.  Lose weight if you are overweight. Losing even a little  weight can reduce knee pain. Ask your health care provider what your ideal weight is, and how to safely lose extra weight. A food expert (dietitian) may be able to help you plan your meals.  Do not use any products that contain nicotine or tobacco, such as cigarettes, e-cigarettes, and chewing tobacco. These can delay healing. If you need help quitting, ask your health care provider.  Keep all follow-up visits as told by your health care provider. This is important. Contact a health care provider if:  You have knee pain that is not getting better or gets worse.  You are unable to do your physical therapy exercises due to knee pain. Get help right away if:  Your knee swells and the swelling becomes worse.  You cannot move your knee.  You have severe knee pain. Summary  Knee pain that lasts more than 3 months is considered chronic knee pain.  The main treatments for chronic knee pain are physical therapy and weight loss. You may also need to take medicines, wear a knee sleeve or brace, use crutches, and apply ice or heat.  Losing even a little weight can reduce knee pain. Ask your health care provider what your ideal weight is, and how to safely lose extra weight. A food expert (dietitian) may be able to help you plan your meals.  Work with a physical therapist to make a safe exercise program, as told by your health care provider. This information is not intended to replace advice given to you by your health care provider. Make sure you discuss any questions you have with your health care provider. Document Revised: 10/08/2018 Document Reviewed: 10/08/2018 Elsevier Patient Education  [place].  BMI for Adults What is BMI? Body mass index (BMI) is a number that is calculated from a person's weight and height. BMI can help estimate how much of a person's weight is composed of fat. BMI does not measure body fat directly. Rather, it is an alternative to procedures that directly  measure body fat, which can be difficult and expensive. BMI can help identify people who may be at higher risk for certain medical problems. What are BMI measurements used for? BMI is used as a screening tool to identify possible weight problems. It helps determine whether a person is obese, overweight, a healthy weight, or underweight. BMI is useful for:  Identifying a weight problem that may be related to a medical condition or may increase the risk for medical problems.  Promoting changes, such as changes in diet and exercise, to help reach a healthy weight. BMI screening can be repeated to see if these changes are working. How is BMI calculated? BMI involves measuring your weight in relation to your height. Both height and weight are measured, and the BMI is calculated from those numbers. This can be done either in [place] ([place] or metric measurements. Note that charts and online BMI calculators are available to help you find your BMI quickly and easily without having to do these  calculations yourself. To calculate your BMI in [place] ([place] measurements:  1. Measure your weight in pounds (lb). 2. Multiply the number of pounds by 703. ? For example, for a person who weighs 180 lb, multiply that number by 703, which equals 126,540. 3. Measure your height in inches. Then multiply that number by itself to get a measurement called "inches squared." ? For example, for a person who is 70 inches tall, the "inches squared" measurement is 70 inches x 70 inches, which equals 4,900 inches squared. 4. Divide the total from step 2 (number of lb x 703) by the total from step 3 (inches squared): 126,540  4,900 = 25.8. This is your BMI. To calculate your BMI in metric measurements: 1. Measure your weight in kilograms (kg). 2. Measure your height in meters (m). Then multiply that number by itself to get a measurement called "meters squared." ? For example, for a person who is 1.75 m tall, the "meters  squared" measurement is 1.75 m x 1.75 m, which is equal to 3.1 meters squared. 3. Divide the number of kilograms (your weight) by the meters squared number. In this example: 70  3.1 = 22.6. This is your BMI. What do the results mean? BMI charts are used to identify whether you are underweight, normal weight, overweight, or obese. The following guidelines will be used:  Underweight: BMI less than 18.5.  Normal weight: BMI between 18.5 and 24.9.  Overweight: BMI between 25 and 29.9.  Obese: BMI of 30 or above. Keep these notes in mind:  Weight includes both fat and muscle, so someone with a muscular build, such as an athlete, may have a BMI that is higher than 24.9. In cases like these, BMI is not an accurate measure of body fat.  To determine if excess body fat is the cause of a BMI of 25 or higher, further assessments may need to be done by a health care provider.  BMI is usually interpreted in the same way for men and women. Where to find more information For more information about BMI, including tools to quickly calculate your BMI, go to these websites:  Centers for Disease Control and Prevention: http://www.wolf.info/  American Heart Association: www.heart.org  National Heart, Lung, and Blood Institute: https://wilson-eaton.com/ Summary  Body mass index (BMI) is a number that is calculated from a person's weight and height.  BMI may help estimate how much of a person's weight is composed of fat. BMI can help identify those who may be at higher risk for certain medical problems.  BMI can be measured using English measurements or metric measurements.  BMI charts are used to identify whether you are underweight, normal weight, overweight, or obese. This information is not intended to replace advice given to you by your health care provider. Make sure you discuss any questions you have with your health care provider. Document Revised: 04/21/2019 Document Reviewed: 02/26/2019 Elsevier Patient  Education  [place].  Obesity, Adult Obesity is the condition of having too much total body fat. Being overweight or obese means that your weight is greater than what is considered healthy for your body size. Obesity is determined by a measurement called BMI. BMI is an estimate of body fat and is calculated from height and weight. For adults, a BMI of 30 or higher is considered obese. Obesity can lead to other health concerns and major illnesses, including:  Stroke.  Coronary artery disease (CAD).  Type 2 diabetes.  Some types of cancer, including  cancers of the colon, breast, uterus, and gallbladder.  Osteoarthritis.  High blood pressure (hypertension).  High cholesterol.  Sleep apnea.  Gallbladder stones.  Infertility problems. What are the causes? Common causes of this condition include:  Eating daily meals that are high in calories, sugar, and fat.  Being born with genes that may make you more likely to become obese.  Having a medical condition that causes obesity, including: ? Hypothyroidism. ? Polycystic ovarian syndrome (PCOS). ? Binge-eating disorder. ? Cushing syndrome.  Taking certain medicines, such as steroids, antidepressants, and seizure medicines.  Not being physically active (sedentary lifestyle).  Not getting enough sleep.  Drinking high amounts of sugar-sweetened beverages, such as soft drinks. What increases the risk? The following factors may make you more likely to develop this condition:  Having a family history of obesity.  Being a woman of African American descent.  Being a man of Hispanic descent.  Living in an area with limited access to: ? [place], recreation centers, or sidewalks. ? Healthy food choices, such as grocery stores and farmers' markets. What are the signs or symptoms? The main sign of this condition is having too much body fat. How is this diagnosed? This condition is diagnosed based on:  Your BMI. If you  are an adult with a BMI of 30 or higher, you are considered obese.  Your waist circumference. This measures the distance around your waistline.  Your skinfold thickness. Your health care provider may gently pinch a fold of your skin and measure it. You may have other tests to check for underlying conditions. How is this treated? Treatment for this condition often includes changing your lifestyle. Treatment may include some or all of the following:  Dietary changes. This may include developing a healthy meal plan.  Regular physical activity. This may include activity that causes your heart to beat faster (aerobic exercise) and strength training. Work with your health care provider to design an exercise program that works for you.  Medicine to help you lose weight if you are unable to lose 1 pound a week after 6 weeks of healthy eating and more physical activity.  Treating conditions that cause the obesity (underlying conditions).  Surgery. Surgical options may include gastric banding and gastric bypass. Surgery may be done if: ? Other treatments have not helped to improve your condition. ? You have a BMI of 40 or higher. ? You have life-threatening health problems related to obesity. Follow these instructions at home: Eating and drinking   Follow recommendations from your health care provider about what you eat and drink. Your health care provider may advise you to: ? Limit fast food, sweets, and processed snack foods. ? Choose low-fat options, such as low-fat milk instead of whole milk. ? Eat 5 or more servings of fruits or vegetables every day. ? Eat at home more often. This gives you more control over what you eat. ? Choose healthy foods when you eat out. ? Learn to read food labels. This will help you understand how much food is considered 1 serving. ? Learn what a healthy serving size is. ? Keep low-fat snacks available. ? Limit sugary drinks, such as soda, fruit juice,  sweetened iced tea, and flavored milk.  Drink enough water to keep your urine pale yellow.  Do not follow a fad diet. Fad diets can be unhealthy and even dangerous. Physical activity  Exercise regularly, as told by your health care provider. ? Most adults should get up to 150 minutes of  moderate-intensity exercise every week. ? Ask your health care provider what types of exercise are safe for you and how often you should exercise.  Warm up and stretch before being active.  Cool down and stretch after being active.  Rest between periods of activity. Lifestyle  Work with your health care provider and a dietitian to set a weight-loss goal that is healthy and reasonable for you.  Limit your screen time.  Find ways to reward yourself that do not involve food.  Do not drink alcohol if: ? Your health care provider tells you not to drink. ? You are pregnant, may be pregnant, or are planning to become pregnant.  If you drink alcohol: ? Limit how much you use to:  0-1 drink a day for women.  0-2 drinks a day for men. ? Be aware of how much alcohol is in your drink. In the [place] one drink equals one 12 oz bottle of beer (355 mL), one 5 oz glass of wine (148 mL), or one 1 oz glass of hard liquor (44 mL). General instructions  Keep a weight-loss journal to keep track of the food you eat and how much exercise you get.  Take over-the-counter and prescription medicines only as told by your health care provider.  Take vitamins and supplements only as told by your health care provider.  Consider joining a support group. Your health care provider may be able to recommend a support group.  Keep all follow-up visits as told by your health care provider. This is important. Contact a health care provider if:  You are unable to meet your weight loss goal after 6 weeks of dietary and lifestyle changes. Get help right away if you are having:  Trouble breathing.  Suicidal thoughts or  behaviors. Summary  Obesity is the condition of having too much total body fat.  Being overweight or obese means that your weight is greater than what is considered healthy for your body size.  Work with your health care provider and a dietitian to set a weight-loss goal that is healthy and reasonable for you.  Exercise regularly, as told by your health care provider. Ask your health care provider what types of exercise are safe for you and how often you should exercise. This information is not intended to replace advice given to you by your health care provider. Make sure you discuss any questions you have with your health care provider. Document Revised: 04/02/2018 Document Reviewed: 04/02/2018 Elsevier Patient Education  2020 [employer].

## 2020-01-18 NOTE — Progress Notes (Signed)
Established Patient Office Visit  Subjective:  Patient ID: Kristi Hunter, female    DOB: 03-30-1941  Age: 79 y.o. MRN: [id number]  CC:  Chief Complaint  Patient presents with  . Hypertension    Patient is here today to F/U with HTN which has been well controlled on Chlorthalidone.  . Nocturia    Patient is also here to F/U with Nocturia with Incontinence. At 3.2.21 visit it was mutually agreed on for referral to Urology. She has been seeing [facility]. She is seeing PT once weekly for this and they also help her with her left leg. She is asking for a referral to a PT for her leg  . Insomnia    She is also here to F/U with insomnia. At last visit her Trazodone was increased to the whole 50mg  1qhs. It was in mutual agreement that referral to Pulmonology to eval for Sleep Apnea. Referral shows "ready for initial scheduling." She states that she has not seen a Pulmonologist. She takes the trazodone and needs a refill. She does not want to take it all the time because she wakes up drowsy.    HPI Kristi Hunter presents for follow-up of her insomnia.  Taking the trazodone intermittently.  It helps but she has noted some morning grogginess that seems to clear the more often she takes it.  Ongoing problems with her left knee pain.  She has fluid on her knee and massage seems to help briefly.  Initial sports medicine consultation was not helpful.  Longstanding history of obesity.  Says that she is restricted to 1500 cal continues to gain weight.  She tells me that she has not heard about scheduling for a sleep study.  She is taking 600 mg of calcium daily and an unknown dose of vitamin D for osteoporosis.  She is afraid to take a bisphosphonate out of fear for her dental health.  Past Medical History:  Diagnosis Date  . Arthritis   . Cancer ([place])    uterine  . Chicken pox   . Chronic kidney disease   . Emphysema of lung ([place])   . Glaucoma   . Hypertension   . Urine incontinence   .  UTI (urinary tract infection)     Past Surgical History:  Procedure Laterality Date  . ABDOMINAL HYSTERECTOMY  2011  . REPLACEMENT TOTAL KNEE Right 2012    Family History  Problem Relation Age of Onset  . Cancer Father        skin  . Alcohol abuse Father   . Asthma Father   . COPD Father   . Heart attack Father   . Heart disease Father   . Cancer Maternal Grandfather 13       colon cancer  . Heart attack Mother   . Heart disease Mother   . Hyperlipidemia Mother   . Hypertension Mother   . Stroke Mother   . Miscarriages / [name] Mother   . Alcohol abuse Brother   . Arthritis Brother   . Drug abuse Brother   . Depression Daughter   . Hypertension Daughter   . Mental illness Daughter   . Depression Son   . Arthritis Maternal Grandmother   . Diabetes Maternal Grandmother   . Hyperlipidemia Maternal Grandmother   . Alcohol abuse Paternal Grandfather   . Arthritis Paternal Grandfather   . Cirrhosis Paternal Grandfather   . Alcohol abuse Brother   . Breast cancer Paternal Aunt     Social History  Socioeconomic History  . Marital status: Widowed    Spouse name: Not on file  . Number of children: 3  . Years of education: Not on file  . Highest education level: Not on file  Occupational History    Comment: Retired Engineer, maintenance (IT)  Tobacco Use  . Smoking status: Former Smoker    Types: Cigarettes    Quit date: 08/12/1982    Years since quitting: 37.4  . Smokeless tobacco: Never Used  Substance and Sexual Activity  . Alcohol use: Yes    Alcohol/week: 1.0 standard drinks    Types: 1 Glasses of wine per week    Comment: every 1-2 wks.   . Drug use: Never  . Sexual activity: Not on file  Other Topics Concern  . Not on file  Social History Narrative  . Not on file   Social Determinants of Health   Financial Resource Strain:   . Difficulty of Paying Living Expenses:   Food Insecurity:   . Worried About Charity fundraiser in the Last Year:   . Arboriculturist in  the Last Year:   Transportation Needs:   . Film/video editor (Medical):   Marland Kitchen Lack of Transportation (Non-Medical):   Physical Activity:   . Days of Exercise per Week:   . Minutes of Exercise per Session:   Stress:   . Feeling of Stress :   Social Connections:   . Frequency of Communication with Friends and Family:   . Frequency of Social Gatherings with Friends and Family:   . Attends Religious Services:   . Active Member of Clubs or Organizations:   . Attends Archivist Meetings:   Marland Kitchen Marital Status:   Intimate Partner Violence:   . Fear of Current or Ex-Partner:   . Emotionally Abused:   Marland Kitchen Physically Abused:   . Sexually Abused:     Outpatient Medications Prior to Visit  Medication Sig Dispense Refill  . aspirin 81 MG chewable tablet Chew by mouth daily.    . Calcium Carb-Cholecalciferol (CALCIUM 600 + D PO) Take by mouth.    . chlorthalidone (HYGROTON) 25 MG tablet Take 1 tablet (25 mg total) by mouth daily. 90 tablet 0  . Cholecalciferol (VITAMIN D3 PO) Take 50 mcg by mouth.    Marland Kitchen GLUCOSAMINE-CHONDROITIN-VIT D3 PO Take 2,000 Units by mouth.    Marland Kitchen ketoconazole (NIZORAL) 2 % shampoo Apply 1 application topically 2 (two) times a week. 120 mL 2  . Melatonin 10 MG TABS Take by mouth.    . metroNIDAZOLE (METROGEL) 0.75 % gel Apply 1 application topically 2 (two) times daily. 45 g 1  . Multiple Vitamin (MULTIVITAMIN) tablet Take 1 tablet by mouth daily.    . naproxen (NAPROSYN) 500 MG tablet Take 1 tablet (500 mg total) by mouth 2 (two) times daily with a meal. 30 tablet 0  . traZODone (DESYREL) 50 MG tablet TAKE 0.5 TABLETS (25 MG TOTAL) BY MOUTH AT BEDTIME AS NEEDED FOR SLEEP. 45 tablet 1  . naproxen sodium (ALEVE) 220 MG tablet Take 220 mg by mouth.     No facility-administered medications prior to visit.    Allergies  Allergen Reactions  . Percocet [Oxycodone-Acetaminophen]     ROS Review of Systems  Constitutional: Negative.   HENT: Negative.    Respiratory: Negative.   Cardiovascular: Negative.   Gastrointestinal: Negative.   Endocrine: Negative for polyphagia and polyuria.  Genitourinary: Negative.   Musculoskeletal: Positive for arthralgias and gait problem.  Allergic/Immunologic: Negative for immunocompromised state.  Neurological: Negative for tremors and speech difficulty.  Hematological: Does not bruise/bleed easily.  Psychiatric/Behavioral: Positive for sleep disturbance.      Objective:    Physical Exam  Constitutional: She is oriented to person, place, and time. She appears well-developed and well-nourished. No distress.  HENT:  Head: Normocephalic and atraumatic.  Right Ear: External ear normal.  Left Ear: External ear normal.  Mouth/Throat:    Eyes: Conjunctivae are normal. Right eye exhibits no discharge. Left eye exhibits no discharge. No scleral icterus.  Neck: No JVD present. No tracheal deviation present. No thyromegaly present.  Cardiovascular: Normal rate and regular rhythm.  Pulmonary/Chest: Effort normal and breath sounds normal. No stridor.  Musculoskeletal:     Left knee: Swelling and effusion present. Decreased range of motion. Tenderness present over the medial joint line.  Lymphadenopathy:    She has no cervical adenopathy.  Neurological: She is alert and oriented to person, place, and time.  Skin: Skin is warm and dry. She is not diaphoretic.  Psychiatric: She has a normal mood and affect. Her behavior is normal.    BP 118/74 (BP Location: Left Arm, Patient Position: Sitting, Cuff Size: Large)   Pulse 78   Temp 98.1 F (36.7 C) (Temporal)   Ht 5\' 4"  (1.626 m)   Wt 272 lb 12.8 oz (123.7 kg)   SpO2 96%   BMI 46.83 kg/m  Wt Readings from Last 3 Encounters:  01/18/20 272 lb 12.8 oz (123.7 kg)  11/10/19 268 lb 9.6 oz (121.8 kg)  10/12/19 268 lb 6.4 oz (121.7 kg)     Health Maintenance Due  Topic Date Due  . Hepatitis C Screening  Never done  . TETANUS/TDAP  Never done  . PNA  vac Low Risk Adult (1 of 2 - PCV13) Never done    There are no preventive care reminders to display for this patient.  Lab Results  Component Value Date   TSH 1.71 01/13/2019   Lab Results  Component Value Date   WBC 6.9 05/10/2019   HGB 12.4 05/10/2019   HCT 38.6 05/10/2019   MCV 83.6 05/10/2019   PLT 306.0 05/10/2019   Lab Results  Component Value Date   NA 135 10/12/2019   K 3.9 10/12/2019   CO2 30 10/12/2019   GLUCOSE 98 10/12/2019   BUN 19 10/12/2019   CREATININE 0.64 10/12/2019   BILITOT 0.4 01/13/2019   ALKPHOS 73 01/13/2019   AST 19 01/13/2019   ALT 19 01/13/2019   PROT 7.2 01/13/2019   ALBUMIN 3.6 01/13/2019   CALCIUM 9.1 10/12/2019   GFR 90.18 10/12/2019   Lab Results  Component Value Date   CHOL 151 01/13/2019   Lab Results  Component Value Date   HDL 44.90 01/13/2019   Lab Results  Component Value Date   LDLCALC 92 01/13/2019   Lab Results  Component Value Date   TRIG 72.0 01/13/2019   Lab Results  Component Value Date   CHOLHDL 3 01/13/2019   Lab Results  Component Value Date   HGBA1C 5.9 05/10/2019      Assessment & Plan:   Problem List Items Addressed This Visit      Cardiovascular and Mediastinum   Essential hypertension     Musculoskeletal and Integument   Age-related osteoporosis without current pathological fracture   Relevant Medications   denosumab (PROLIA) 60 MG/ML SOSY injection     Other   Left knee pain   Relevant Medications  naproxen (NAPROSYN) 500 MG tablet   Other Relevant Orders   Ambulatory referral to Sports Medicine   Other insomnia - Primary   Relevant Medications   traZODone (DESYREL) 50 MG tablet   Apnea   Relevant Orders   Ambulatory referral to Sleep Studies   Morbid obesity ([place])   Relevant Orders   Amb Ref to Medical Weight Management   Musculoskeletal back pain   Relevant Medications   naproxen (NAPROSYN) 500 MG tablet      Meds ordered this encounter  Medications  . DISCONTD:  traZODone (DESYREL) 50 MG tablet    Sig: Take 0.5 tablets (25 mg total) by mouth at bedtime as needed for sleep.    Dispense:  45 tablet    Refill:  2  . denosumab (PROLIA) 60 MG/ML SOSY injection    Sig: Inject 60 mg into the skin once for 1 dose.    Dispense:  1 mL    Refill:  0  . naproxen (NAPROSYN) 500 MG tablet    Sig: Take one twice daily as needed for pain with food.    Dispense:  60 tablet    Refill:  0  . traZODone (DESYREL) 50 MG tablet    Sig: Take 1 tablet (50 mg total) by mouth at bedtime.    Dispense:  90 tablet    Refill:  1    Follow-up: Return in about 3 months (around 04/19/2020).  Given information on chronic knee pain as well as BMI and obesity.  She would like to try Prolia for osteoporosis.  She will start calcium 600 mg twice daily and  1000 international units of vitamin D.   Continue with trazodone.  I referred her for sleep study.  She agrees to give medical weight loss management to try as long as insurance pays for it.  I spent 45 minutes with this patient with greater than 50% of the time engaged in counseling.  Libby Maw, MD

## 2020-01-20 ENCOUNTER — Ambulatory Visit (INDEPENDENT_AMBULATORY_CARE_PROVIDER_SITE_OTHER): Payer: Medicare HMO

## 2020-01-20 ENCOUNTER — Encounter: Payer: Self-pay | Admitting: Family Medicine

## 2020-01-20 ENCOUNTER — Other Ambulatory Visit: Payer: Self-pay

## 2020-01-20 ENCOUNTER — Ambulatory Visit: Payer: Medicare HMO | Admitting: Family Medicine

## 2020-01-20 VITALS — BP 136/70 | HR 77 | Ht 64.0 in | Wt 273.0 lb

## 2020-01-20 DIAGNOSIS — M6289 Other specified disorders of muscle: Secondary | ICD-10-CM | POA: Diagnosis not present

## 2020-01-20 DIAGNOSIS — M25562 Pain in left knee: Secondary | ICD-10-CM

## 2020-01-20 DIAGNOSIS — N3946 Mixed incontinence: Secondary | ICD-10-CM | POA: Diagnosis not present

## 2020-01-20 DIAGNOSIS — R351 Nocturia: Secondary | ICD-10-CM | POA: Diagnosis not present

## 2020-01-20 DIAGNOSIS — M7989 Other specified soft tissue disorders: Secondary | ICD-10-CM

## 2020-01-20 DIAGNOSIS — M62838 Other muscle spasm: Secondary | ICD-10-CM | POA: Diagnosis not present

## 2020-01-20 DIAGNOSIS — R3915 Urgency of urination: Secondary | ICD-10-CM | POA: Diagnosis not present

## 2020-01-20 DIAGNOSIS — M6281 Muscle weakness (generalized): Secondary | ICD-10-CM | POA: Diagnosis not present

## 2020-01-20 NOTE — Patient Instructions (Addendum)
Thank you for coming in today. You do not have a lot of knee swelling (joint effusion).  I will get you into the lymphedema PT in [place] that your daughter will be going to.  Let me know if you have a problem getting in.  I am happy to send referrals to other places.   Let me know if you want to try the gel shots (for arthritis pain).

## 2020-01-20 NOTE — Progress Notes (Signed)
Subjective:    I'm seeing this patient as a consultation for: Kristi Maw, MD . Note will be routed back to referring provider/PCP.  CC: L knee pain    I, Kristi Hunter, am serving as a scribe for Dr. [name].  HPI: Patient is a 79 year old female who presents to [place] [facility] today for L knee pain.  She was seen for knee pain by Dr. Raeford Razor back in October and November 2020.  She was thought to have pain due to osteoarthritis which was seen on x-ray.  She had trial steroid injection which was only a little bit helpful.  She notes that she has leg swelling and discomfort that she attributes to lymphedema.  Previously she was seen in the lymphedema clinic and [place] which was very helpful.  She like to get going with the lymphedema physical therapist if possible.  Her daughter is about to start seeing one in [facility] and she would like to get a referral there if possible.  She had steroid injections have only provided temporary relief for similar knee pain in the past.  y massage the legs to help.   Past medical history, Surgical history, Family history, Social history, Allergies, and medications have been entered into the medical record, reviewed.   Review of Systems: No new headache, visual changes, nausea, vomiting, diarrhea, constipation, dizziness, abdominal pain, skin rash, fevers, chills, night sweats, weight loss, swollen lymph nodes, body aches, joint swelling, muscle aches, chest pain, shortness of breath, mood changes, visual or auditory hallucinations.   Objective:    Vitals:   01/20/20 1336  BP: 136/70  Pulse: 77  SpO2: 97%   General: Well Developed, well nourished, and in no acute distress.  Obese Neuro/Psych: Alert and oriented x3, extra-ocular muscles intact, able to move all 4 extremities, sensation grossly intact. Skin: Warm and dry, no rashes noted.  Respiratory: Not using accessory muscles, speaking in full sentences,  trachea midline.  Cardiovascular: Pulses palpable, no extremity edema. Abdomen: Does not appear distended. MSK: Left leg slightly swollen with nonpitting edema. No obvious joint effusion present. Knee nontender. Range of motion 5-100 degrees. Stable ligamentous exam. Intact strength.   Lab and Radiology Results EXAM: LEFT KNEE - COMPLETE 4+ VIEW  COMPARISON:  None.  FINDINGS: There is no acute fracture or dislocation. The bones are osteopenic. Mild arthritic changes of the knee with meniscal chondrocalcinosis and tricompartmental narrowing. No significant joint effusion. The soft tissues are unremarkable.  IMPRESSION: 1. No acute fracture or dislocation. 2. Osteopenia with mild arthritic changes.   Electronically Signed   By: Anner Crete M.D.   On: 06/08/2019 00:57 I, [name], personally (independently) visualized and performed the interpretation of the images attached in this note.  Diagnostic Limited MSK Ultrasound of: Left knee Quad tendon intact normal-appearing No significant joint effusion superior patellar space. Patellar tendon intact normal-appearing Medial and lateral joint line narrowed with degenerative appearance of joint line. Posterior knee reveals no significant Baker's cyst. Impression: DJD no joint effusion or Baker's cyst.   Impression and Recommendations:    Assessment and Plan: 79 y.o. female with left knee pain and leg swelling.  Patient certainly has a component of DJD.  She notes that she has had similar symptoms in the past and did well with lymphedema treatment.  I think it is reasonable to have a trial of physical therapy directed for lymphedema.  She notes that [facility] has a lymphedema specialist  and she would like a referral there if possible.  Plan to proceed with that treatment and recheck back with me as needed.Marland Kitchen  PDMP not reviewed this encounter. Orders Placed This Encounter  Procedures  . [name] LIMITED JOINT  SPACE STRUCTURES LOW LEFT    Standing Status:   Future    Number of Occurrences:   1    Standing Expiration Date:   01/19/2021    Order Specific Question:   Reason for Exam (SYMPTOM  OR DIAGNOSIS REQUIRED)    Answer:   L knee pain    Order Specific Question:   Preferred imaging location?    Answer:   [place]  . Ambulatory referral to Physical Therapy    Referral Priority:   Routine    Referral Type:   Physical Medicine    Referral Reason:   Specialty Services Required    Requested Specialty:   Physical Therapy    Number of Visits Requested:   1   No orders of the defined types were placed in this encounter.   Discussed warning signs or symptoms. Please see discharge instructions. Patient expresses understanding.   The above documentation has been reviewed and is accurate and complete [name], M.D.

## 2020-01-24 ENCOUNTER — Other Ambulatory Visit: Payer: Self-pay

## 2020-01-24 ENCOUNTER — Ambulatory Visit (INDEPENDENT_AMBULATORY_CARE_PROVIDER_SITE_OTHER): Payer: Medicare HMO | Admitting: Neurology

## 2020-01-24 ENCOUNTER — Encounter: Payer: Self-pay | Admitting: Neurology

## 2020-01-24 VITALS — BP 124/86 | HR 93 | Ht 64.0 in | Wt 269.0 lb

## 2020-01-24 DIAGNOSIS — M1710 Unilateral primary osteoarthritis, unspecified knee: Secondary | ICD-10-CM | POA: Diagnosis not present

## 2020-01-24 DIAGNOSIS — J3089 Other allergic rhinitis: Secondary | ICD-10-CM

## 2020-01-24 DIAGNOSIS — R0683 Snoring: Secondary | ICD-10-CM

## 2020-01-24 DIAGNOSIS — I10 Essential (primary) hypertension: Secondary | ICD-10-CM

## 2020-01-24 NOTE — Patient Instructions (Signed)

## 2020-01-24 NOTE — Progress Notes (Signed)
SLEEP MEDICINE CLINIC    Provider:  Larey Seat, MD  Primary Care Physician:  Libby Maw, MD [name] [address]     Referring Provider: Libby Maw, [address] [place],  [place] [address]          Chief Complaint according to patient   Patient presents with:    . New Patient (Initial Visit)     Rm 11 alone- here for sleep consult, no prior sleep study. Pt reports snoring is present.      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  Chief concern according to patient : Rm 11 alone- here for sleep consult, no prior sleep study. Pt reports snoring is present.   I have the pleasure of seeing Kristi Hunter today, a right -handed Caucasian female with a possible sleep disorder.  She  has a past medical history of Arthritis, Cancer ([place]), Chicken pox, Chronic kidney disease, Emphysema of lung ([place]), Glaucoma, Hypertension, Urine incontinence, and UTI (urinary tract infection). polyuria, Poly arthritis, pain in the morning, stiffness.  Knee replacement in the right - lymphedema. Hand pain. Hip pain.    Sleep relevant medical history: Nocturia- 4-5 times (!) limiting her nocturnal sleep to about 4 hours.   Tonsillectomy/ ENT : None. Sinusitis, and post nasal drip.    Family medical /sleep history: daughter with OSA, no insomnia, no sleep walkers. sons are short sleepers, daughter has hypersomnia.    Social history: Patient is retired from Engineer, maintenance (IT) and lives in a household with persons/ alone. Family status is widowed , grew up  [employer], married to a jewish spouse, with 3 adult children, several grandchildren.  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  The patient currently works part time during tax season.  Pets are  present. Tobacco use: quit in 1985.  ETOH use: rare  Caffeine  intake in form of Coffee( 2 cups ) Soda(  One can a day) Tea ( rare - in restaurants, hot tea at home is decaffeinated)  or energy drinks. Regular exercise- none .  Wants to go back to silver sneakers.  Hobbies : reading,    Sleep habits are as follows: The patient's dinner time is between 6 PM. The patient goes to bed at 11 PM and watches Tv in bed- asleep within 15 minutes. She continues to sleep for 1-2  hours, wakes for many, many bathroom breaks, the first time at 2 AM.   The preferred sleep position is the left side , with the support of 1-2  pillows.  She often changes to a recliner . She feels her legs are restless.  She uses body pillows , helping with joint pain. Has an adjustable bed- She looks at the clock when she wakes.  Dreams are reportedly infrequent/ vivid.  7 -7.30 AM is the usual rise time. The patient wakes up spontaneously at 7 AM.  She reports not feeling refreshed or restored in AM, with symptoms such as joint stiffness and pain- , morning headaches, and residual fatigue. Naps are taken infrequently, lasting from 30 minutes and are more refreshing than nocturnal sleep.    Review of Systems: Out of a complete 14 system review, the patient complains of only the following symptoms, and all other reviewed systems are negative.:  Fatigue, sleepiness , snoring, fragmented sleep, pain and NOCTURIA>    How likely are you to doze in the following situations: 0 = not likely, 1 = slight chance, 2 = moderate chance, 3 = high chance   Sitting and Reading? Watching Television? Sitting inactive in a public place (theater or meeting)? As a passenger in a car for an hour without a break? Lying down in the afternoon when circumstances permit? Sitting and talking to someone? Sitting quietly after lunch without alcohol? In a car, while stopped for a few minutes in traffic?   Total = / 24 points   FSS endorsed at 57/ 63 points.   GDS 3/ 15 points  Social History   Socioeconomic  History  . Marital status: Widowed    Spouse name: Not on file  . Number of children: 3  . Years of education: Not on file  . Highest education level: Not on file  Occupational History    Comment: Retired Engineer, maintenance (IT)  Tobacco Use  . Smoking status: Former Smoker    Types: Cigarettes    Quit date: 08/12/1982    Years since quitting: 37.4  . Smokeless tobacco: Never Used  Vaping Use  . Vaping Use: Never used  Substance and Sexual Activity  . Alcohol use: Yes    Alcohol/week: 1.0 standard drink    Types: 1 Glasses of wine per week    Comment: every 1-2 wks.   . Drug use: Never  . Sexual activity: Not on file  Other Topics Concern  . Not on file  Social History Narrative  . Not on file   Social Determinants of Health   Financial Resource Strain:   . Difficulty of Paying Living Expenses:   Food Insecurity:   . Worried About Charity fundraiser in the Last Year:   . Arboriculturist in the Last Year:   Transportation Needs:   . Film/video editor (Medical):   Marland Kitchen Lack of Transportation (Non-Medical):   Physical Activity:   . Days of Exercise per Week:   . Minutes of Exercise per Session:   Stress:   . Feeling of Stress :   Social Connections:   . Frequency of Communication with Friends and Family:   . Frequency of Social Gatherings with Friends and Family:   . Attends Religious Services:   . Active Member of Clubs or Organizations:   . Attends Archivist Meetings:   Marland Kitchen Marital Status:     Family History  Problem Relation Age of Onset  . Cancer Father        skin  . Alcohol abuse Father   . Asthma Father   . COPD Father   . Heart attack Father   . Heart disease Father   . Cancer Maternal Grandfather 50       colon cancer  . Heart attack Mother   . Heart disease Mother   . Hyperlipidemia Mother   . Hypertension Mother   . Stroke Mother   . Miscarriages / [name] Mother   . Alcohol abuse Brother   . Arthritis Brother   . Drug abuse Brother   .  Depression Daughter   . Hypertension Daughter   . Mental illness Daughter   . Depression Son   . Arthritis Maternal Grandmother   . Diabetes Maternal Grandmother   . Hyperlipidemia Maternal Grandmother   . Alcohol abuse Paternal Grandfather   . Arthritis Paternal Grandfather   . Cirrhosis Paternal Grandfather   .  Alcohol abuse Brother   . Breast cancer Paternal Aunt     Past Medical History:  Diagnosis Date  . Arthritis   . Cancer ([place])    uterine  . Chicken pox   . Chronic kidney disease   . Emphysema of lung ([place])   . Glaucoma   . Hypertension   . Urine incontinence   . UTI (urinary tract infection)     Past Surgical History:  Procedure Laterality Date  . ABDOMINAL HYSTERECTOMY  2011  . REPLACEMENT TOTAL KNEE Right 2012     Current Outpatient Medications on File Prior to Visit  Medication Sig Dispense Refill  . aspirin 81 MG chewable tablet Chew by mouth daily.    . Calcium Carb-Cholecalciferol (CALCIUM 600 + D PO) Take by mouth.    . chlorthalidone (HYGROTON) 25 MG tablet Take 1 tablet (25 mg total) by mouth daily. 90 tablet 0  . Cholecalciferol (VITAMIN D3 PO) Take 50 mcg by mouth.    Marland Kitchen GLUCOSAMINE-CHONDROITIN-VIT D3 PO Take 2,000 Units by mouth.    Marland Kitchen ketoconazole (NIZORAL) 2 % shampoo Apply 1 application topically 2 (two) times a week. 120 mL 2  . Melatonin 10 MG TABS Take by mouth.    . metroNIDAZOLE (METROGEL) 0.75 % gel Apply 1 application topically 2 (two) times daily. 45 g 1  . Multiple Vitamin (MULTIVITAMIN) tablet Take 1 tablet by mouth daily.    . naproxen (NAPROSYN) 500 MG tablet Take one twice daily as needed for pain with food. 60 tablet 0  . traZODone (DESYREL) 50 MG tablet Take 1 tablet (50 mg total) by mouth at bedtime. 90 tablet 1   No current facility-administered medications on file prior to visit.    Allergies  Allergen Reactions  . Percocet [Oxycodone-Acetaminophen]     Physical exam:  Today's Vitals   01/24/20 0901  BP: 124/86    Pulse: 93  SpO2: 92%  Weight: 269 lb (122 kg)  Height: 5\' 4"  (1.626 m)   Body mass index is 46.17 kg/m.   Wt Readings from Last 3 Encounters:  01/24/20 269 lb (122 kg)  01/20/20 273 lb (123.8 kg)  01/18/20 272 lb 12.8 oz (123.7 kg)     Ht Readings from Last 3 Encounters:  01/24/20 5\' 4"  (1.626 m)  01/20/20 5\' 4"  (1.626 m)  01/18/20 5\' 4"  (1.626 m)      General: The patient is awake, alert and appears not in acute distress. The patient is well groomed. Head: Normocephalic, atraumatic. Neck is supple. Mallampati 3,  neck circumference: 16.5 inches . Nasal airflow barely patent.   Retrognathia is seen.  Dental status: biological teeth.  Cardiovascular:  Regular rate and cardiac rhythm by pulse,  without distended neck veins. Respiratory: Lungs are clear to auscultation.  Skin:  rosacea. Without evidence of ankle edema, or rash. Trunk: The patient's posture is erect.   Neurologic exam : The patient is awake and alert, oriented to place and time.   Memory subjective described as intact.  Attention span & concentration ability appears normal.  Speech is fluent,  with  dysphonia .  Mood and affect are appropriate.   Cranial nerves: no loss of smell or taste reported  Pupils are equal and briskly reactive to light. Funduscopic exam deferred..  Extraocular movements in vertical and horizontal planes were intact and without nystagmus. No Diplopia. Visual fields by finger perimetry are intact. Hearing was intact to soft voice and finger rubbing.   Facial sensation intact to fine  touch. Facial motor strength is symmetric and tongue and uvula move midline.  Neck ROM : rotation, tilt and flexion extension were normal for age and shoulder shrug was symmetrical.    Motor exam:  ROM limitation in several joints, not weak, stiff and in pain.  Symmetric bulk, tone and ROM.   Normal tone without cog wheeling, symmetric grip strength .   Sensory:  Fine touch, pinprick and vibration were  tested  and  normal.  Proprioception tested in the upper extremities was normal.   Coordination: Rapid alternating movements in the fingers/hands were of normal speed.  The Finger-to-nose maneuver was intact without evidence of ataxia, dysmetria or tremor.   Gait and station: Patient could rise unassisted from a seated position, walked without assistive device.  Stance is of wide base and the patient turned with 4 steps.  Toe and heel walk were deferred.  Deep tendon reflexes: in the upper and lower extremities are symmetric and intact.  Babinski response was deferred .      After spending a total time of 48 minutes face to face and additional time for physical and neurologic examination, review of laboratory studies,  personal review of imaging studies, reports and results of other testing and review of referral information / records as far as provided in visit, I have established the following assessments:  1) no particular Kristi Hunter is a delightful 79 year old retired Engineer, maintenance (IT) who notes that she has been a loud snorer, has first-degree family members with obstructive sleep apnea, and has been struggling with weight.  Her BMI currently is 46.1, her blood pressure is well controlled but she suffers from allergic rhinitis, postnasal drip and a lots of arthritic related pain and mobility issues.  She wakes up often unrefreshed worsened by 4 or more bathroom breaks at night.  I am strongly suspicious that there is sleep apnea present and we will look at this possibility by an attended sleep study if Kristi Hunter permits this.  She has also indicated that sometimes she has a sensation that she would interpret as restless legs.  The irresistible urge to move before she can go back to sleep.  She changes between the bed and a recliner has to tried different positions in the adjustable bed, body pillows etc. these concerns seem to be mostly related to her arthritis.  I would like to thank  Libby Maw, MD and Libby Maw, [address] [place],  [place] [address] for allowing me to meet with and to take care of this pleasant patient.   In short, Kristi Hunter is presenting with edema, morbid obesity and polyarthritis with nocturia.  I plan to follow up either personally or through our NP within 2-3  month.   CC: I will share my notes with PCP   Electronically signed by: Larey Seat, MD 01/24/2020 9:26 AM  [facility] Board certified by The [employer] of Sleep Medicine and Diplomate of the [employer] of Sleep Medicine. Board certified In Neurology through the [employer], Fellow of the [employer] of Neurology. Medical Director of [facility].

## 2020-01-26 DIAGNOSIS — N3946 Mixed incontinence: Secondary | ICD-10-CM | POA: Diagnosis not present

## 2020-01-26 DIAGNOSIS — R3915 Urgency of urination: Secondary | ICD-10-CM | POA: Diagnosis not present

## 2020-01-26 DIAGNOSIS — M6281 Muscle weakness (generalized): Secondary | ICD-10-CM | POA: Diagnosis not present

## 2020-01-26 DIAGNOSIS — R351 Nocturia: Secondary | ICD-10-CM | POA: Diagnosis not present

## 2020-01-26 DIAGNOSIS — M62838 Other muscle spasm: Secondary | ICD-10-CM | POA: Diagnosis not present

## 2020-01-26 DIAGNOSIS — M6289 Other specified disorders of muscle: Secondary | ICD-10-CM | POA: Diagnosis not present

## 2020-01-27 ENCOUNTER — Other Ambulatory Visit: Payer: Self-pay | Admitting: Family Medicine

## 2020-01-27 ENCOUNTER — Telehealth: Payer: Self-pay | Admitting: Family Medicine

## 2020-01-27 DIAGNOSIS — R350 Frequency of micturition: Secondary | ICD-10-CM | POA: Diagnosis not present

## 2020-01-27 DIAGNOSIS — I10 Essential (primary) hypertension: Secondary | ICD-10-CM

## 2020-01-27 NOTE — Telephone Encounter (Signed)
Patient is calling and requesting a refill for [facility]orthalidone sent to [facility] on [address] in [place]. Patient stated that she misplaced prescription. CB is [phone]

## 2020-02-03 ENCOUNTER — Telehealth: Payer: Self-pay

## 2020-02-03 DIAGNOSIS — R350 Frequency of micturition: Secondary | ICD-10-CM | POA: Diagnosis not present

## 2020-02-03 DIAGNOSIS — R3915 Urgency of urination: Secondary | ICD-10-CM | POA: Diagnosis not present

## 2020-02-03 NOTE — Telephone Encounter (Signed)
[facility] states [facility] received Prol[facility] order and would send it to [name]. They also told her that we would call her to schedule the injection. Please let me know if we have received this and when I can schedule her.

## 2020-02-07 DIAGNOSIS — I890 Lymphedema, not elsewhere classified: Secondary | ICD-10-CM | POA: Diagnosis not present

## 2020-02-07 DIAGNOSIS — M25562 Pain in left knee: Secondary | ICD-10-CM | POA: Diagnosis not present

## 2020-02-07 DIAGNOSIS — M7989 Other specified soft tissue disorders: Secondary | ICD-10-CM | POA: Diagnosis not present

## 2020-02-07 NOTE — Telephone Encounter (Signed)
Just sending message your way please let me know if I need to call or anything.

## 2020-02-08 DIAGNOSIS — Z961 Presence of intraocular lens: Secondary | ICD-10-CM | POA: Diagnosis not present

## 2020-02-08 DIAGNOSIS — Z9889 Other specified postprocedural states: Secondary | ICD-10-CM | POA: Diagnosis not present

## 2020-02-08 DIAGNOSIS — H401131 Primary open-angle glaucoma, bilateral, mild stage: Secondary | ICD-10-CM | POA: Diagnosis not present

## 2020-02-08 DIAGNOSIS — D3131 Benign neoplasm of right choroid: Secondary | ICD-10-CM | POA: Diagnosis not present

## 2020-02-08 DIAGNOSIS — H04123 Dry eye syndrome of bilateral lacrimal glands: Secondary | ICD-10-CM | POA: Diagnosis not present

## 2020-02-09 DIAGNOSIS — R351 Nocturia: Secondary | ICD-10-CM | POA: Diagnosis not present

## 2020-02-09 DIAGNOSIS — R3915 Urgency of urination: Secondary | ICD-10-CM | POA: Diagnosis not present

## 2020-02-09 DIAGNOSIS — M6289 Other specified disorders of muscle: Secondary | ICD-10-CM | POA: Diagnosis not present

## 2020-02-09 DIAGNOSIS — M62838 Other muscle spasm: Secondary | ICD-10-CM | POA: Diagnosis not present

## 2020-02-09 DIAGNOSIS — M6281 Muscle weakness (generalized): Secondary | ICD-10-CM | POA: Diagnosis not present

## 2020-02-09 DIAGNOSIS — N3946 Mixed incontinence: Secondary | ICD-10-CM | POA: Diagnosis not present

## 2020-02-10 DIAGNOSIS — R3915 Urgency of urination: Secondary | ICD-10-CM | POA: Diagnosis not present

## 2020-02-10 DIAGNOSIS — R350 Frequency of micturition: Secondary | ICD-10-CM | POA: Diagnosis not present

## 2020-02-15 ENCOUNTER — Telehealth: Payer: Self-pay | Admitting: Behavioral Health

## 2020-02-15 DIAGNOSIS — M7989 Other specified soft tissue disorders: Secondary | ICD-10-CM | POA: Diagnosis not present

## 2020-02-15 DIAGNOSIS — M25562 Pain in left knee: Secondary | ICD-10-CM | POA: Diagnosis not present

## 2020-02-15 DIAGNOSIS — I890 Lymphedema, not elsewhere classified: Secondary | ICD-10-CM | POA: Diagnosis not present

## 2020-02-15 NOTE — Telephone Encounter (Signed)
Received Summary of Benefits for Prolia injection. The estimated out of pocket expense is $230. PA is required and has been approved for the following dates of service: 02/04/20-02/03/21, Auth# [id number].  Informed patient of the above information. She verbalized understanding and would like to proceed with having first Prolia injection administered. Nurse visit appointment has been scheduled for 02/17/20 at 2:00 PM.

## 2020-02-17 ENCOUNTER — Other Ambulatory Visit: Payer: Self-pay

## 2020-02-17 ENCOUNTER — Ambulatory Visit (INDEPENDENT_AMBULATORY_CARE_PROVIDER_SITE_OTHER): Payer: Medicare HMO | Admitting: Behavioral Health

## 2020-02-17 DIAGNOSIS — R3915 Urgency of urination: Secondary | ICD-10-CM | POA: Diagnosis not present

## 2020-02-17 DIAGNOSIS — M810 Age-related osteoporosis without current pathological fracture: Secondary | ICD-10-CM | POA: Diagnosis not present

## 2020-02-17 DIAGNOSIS — R350 Frequency of micturition: Secondary | ICD-10-CM | POA: Diagnosis not present

## 2020-02-17 MED ORDER — DENOSUMAB 60 MG/ML ~~LOC~~ SOSY
60.0000 mg | PREFILLED_SYRINGE | Freq: Once | SUBCUTANEOUS | Status: AC
  Administered 2020-02-17: 60 mg via SUBCUTANEOUS

## 2020-02-17 NOTE — Progress Notes (Addendum)
Patient presents in office today for Prolia injection. SQ injection was given in the right arm. No signs or symptoms of a reaction were noted prior to patient leaving the nurse visit.  Agree.

## 2020-02-18 DIAGNOSIS — I890 Lymphedema, not elsewhere classified: Secondary | ICD-10-CM | POA: Diagnosis not present

## 2020-02-18 DIAGNOSIS — M25562 Pain in left knee: Secondary | ICD-10-CM | POA: Diagnosis not present

## 2020-02-18 DIAGNOSIS — M7989 Other specified soft tissue disorders: Secondary | ICD-10-CM | POA: Diagnosis not present

## 2020-02-21 DIAGNOSIS — M7989 Other specified soft tissue disorders: Secondary | ICD-10-CM | POA: Diagnosis not present

## 2020-02-21 DIAGNOSIS — M25562 Pain in left knee: Secondary | ICD-10-CM | POA: Diagnosis not present

## 2020-02-21 DIAGNOSIS — I890 Lymphedema, not elsewhere classified: Secondary | ICD-10-CM | POA: Diagnosis not present

## 2020-02-22 ENCOUNTER — Ambulatory Visit (INDEPENDENT_AMBULATORY_CARE_PROVIDER_SITE_OTHER): Payer: Medicare HMO | Admitting: Family Medicine

## 2020-02-22 ENCOUNTER — Other Ambulatory Visit: Payer: Self-pay

## 2020-02-22 ENCOUNTER — Encounter (INDEPENDENT_AMBULATORY_CARE_PROVIDER_SITE_OTHER): Payer: Self-pay | Admitting: Family Medicine

## 2020-02-22 VITALS — BP 135/78 | HR 72 | Temp 97.7°F | Ht 62.0 in | Wt 267.0 lb

## 2020-02-22 DIAGNOSIS — E559 Vitamin D deficiency, unspecified: Secondary | ICD-10-CM

## 2020-02-22 DIAGNOSIS — R5383 Other fatigue: Secondary | ICD-10-CM

## 2020-02-22 DIAGNOSIS — Z1331 Encounter for screening for depression: Secondary | ICD-10-CM | POA: Diagnosis not present

## 2020-02-22 DIAGNOSIS — R0602 Shortness of breath: Secondary | ICD-10-CM | POA: Diagnosis not present

## 2020-02-22 DIAGNOSIS — R7303 Prediabetes: Secondary | ICD-10-CM | POA: Diagnosis not present

## 2020-02-22 DIAGNOSIS — Z0289 Encounter for other administrative examinations: Secondary | ICD-10-CM

## 2020-02-22 DIAGNOSIS — Z6841 Body Mass Index (BMI) 40.0 and over, adult: Secondary | ICD-10-CM | POA: Diagnosis not present

## 2020-02-22 DIAGNOSIS — E538 Deficiency of other specified B group vitamins: Secondary | ICD-10-CM | POA: Diagnosis not present

## 2020-02-23 LAB — CBC WITH DIFFERENTIAL/PLATELET
Basophils Absolute: 0 10*3/uL (ref 0.0–0.2)
Basos: 1 %
EOS (ABSOLUTE): 0.3 10*3/uL (ref 0.0–0.4)
Eos: 4 %
Hematocrit: 41.3 % (ref 34.0–46.6)
Hemoglobin: 13.2 g/dL (ref 11.1–15.9)
Immature Grans (Abs): 0 10*3/uL (ref 0.0–0.1)
Immature Granulocytes: 0 %
Lymphocytes Absolute: 2.8 10*3/uL (ref 0.7–3.1)
Lymphs: 34 %
MCH: 27.6 pg (ref 26.6–33.0)
MCHC: 32 g/dL (ref 31.5–35.7)
MCV: 86 fL (ref 79–97)
Monocytes Absolute: 0.4 10*3/uL (ref 0.1–0.9)
Monocytes: 6 %
Neutrophils Absolute: 4.5 10*3/uL (ref 1.4–7.0)
Neutrophils: 55 %
Platelets: 332 10*3/uL (ref 150–450)
RBC: 4.78 x10E6/uL (ref 3.77–5.28)
RDW: 12.9 % (ref 11.7–15.4)
WBC: 8.1 10*3/uL (ref 3.4–10.8)

## 2020-02-23 LAB — HEMOGLOBIN A1C
Est. average glucose Bld gHb Est-mCnc: 123 mg/dL
Hgb A1c MFr Bld: 5.9 % — ABNORMAL HIGH (ref 4.8–5.6)

## 2020-02-23 LAB — COMPREHENSIVE METABOLIC PANEL
ALT: 19 IU/L (ref 0–32)
AST: 25 IU/L (ref 0–40)
Albumin/Globulin Ratio: 1.3 (ref 1.2–2.2)
Albumin: 3.7 g/dL (ref 3.7–4.7)
Alkaline Phosphatase: 85 IU/L (ref 48–121)
BUN/Creatinine Ratio: 32 — ABNORMAL HIGH (ref 12–28)
BUN: 18 mg/dL (ref 8–27)
Bilirubin Total: 0.3 mg/dL (ref 0.0–1.2)
CO2: 28 mmol/L (ref 20–29)
Calcium: 8.9 mg/dL (ref 8.7–10.3)
Chloride: 101 mmol/L (ref 96–106)
Creatinine, Ser: 0.57 mg/dL (ref 0.57–1.00)
GFR calc Af Amer: 104 mL/min/{1.73_m2} (ref >59)
GFR calc non Af Amer: 90 mL/min/{1.73_m2} (ref >59)
Globulin, Total: 2.8 g/dL (ref 1.5–4.5)
Glucose: 85 mg/dL (ref 65–99)
Potassium: 4.2 mmol/L (ref 3.5–5.2)
Sodium: 140 mmol/L (ref 134–144)
Total Protein: 6.5 g/dL (ref 6.0–8.5)

## 2020-02-23 LAB — VITAMIN D 25 HYDROXY (VIT D DEFICIENCY, FRACTURES): Vit D, 25-Hydroxy: 42.1 ng/mL (ref 30.0–100.0)

## 2020-02-23 LAB — T3: T3, Total: 112 ng/dL (ref 71–180)

## 2020-02-23 LAB — INSULIN, RANDOM: INSULIN: 21.5 u[IU]/mL (ref 2.6–24.9)

## 2020-02-23 LAB — TSH: TSH: 1.47 u[IU]/mL (ref 0.450–4.500)

## 2020-02-23 LAB — T4, FREE: Free T4: 1.27 ng/dL (ref 0.82–1.77)

## 2020-02-23 LAB — VITAMIN B12: Vitamin B-12: 521 pg/mL (ref 232–1245)

## 2020-02-23 NOTE — Progress Notes (Signed)
Office: [phone]  /  Fax: [phone]    Date: March 08, 2020   Appointment Start Time: 9:06am Duration: 45 minutes Provider: Glennie Isle, Psy.D. Type of Session: Intake for Individual Therapy  Location of Patient: Home Location of Provider: Provider's Home Type of Contact: Telepsychological Visit via MyChart Video Visit  Informed Consent: This provider called Kristi Hunter at 9:02am as she did not present for the telepsychological appointment. Assistance was provided. As such, today's appointment was initiated 6 minutes late. Prior to proceeding with today's appointment, two pieces of identifying information were obtained. In addition, Kristi Hunter's physical location at the time of this appointment was obtained as well a phone number she could be reached at in the event of technical difficulties. Kristi Hunter and this provider participated in today's telepsychological service. Of note, Kristi Hunter provided verbal consent to proceed via a regular telephone call at 9:26am due to audio issues.   The provider's role was explained to Kristi Hunter. The provider reviewed and discussed issues of confidentiality, privacy, and limits therein (e.g., reporting obligations). In addition to verbal informed consent, written informed consent for psychological services was obtained prior to the initial appointment. Since the clinic is not a 24/7 crisis center, mental health emergency resources were shared and this  provider explained MyChart, e-mail, voicemail, and/or other messaging systems should be utilized only for non-emergency reasons. This provider also explained that information obtained during appointments will be placed in Kristi Hunter's medical record and relevant information will be shared with other providers at [facility] for coordination of care. Moreover, Kristi Hunter agreed information may be shared with other [facility] providers as needed for coordination of care. By  signing the service agreement document, Kristi Hunter provided written consent for coordination of care. Prior to initiating telepsychological services, Kristi Hunter completed an informed consent document, which included the development of a safety plan (i.e., an emergency contact, nearest emergency room, and emergency resources) in the event of an emergency/crisis. Kristi Hunter expressed understanding of the rationale of the safety plan. Kristi Hunter verbally acknowledged understanding she is ultimately responsible for understanding her insurance benefits for telepsychological and in-person services. This provider also reviewed confidentiality, as it relates to telepsychological services, as well as the rationale for telepsychological services (i.e., to reduce exposure risk to COVID-19). Kristi Hunter  acknowledged understanding that appointments cannot be recorded without both party consent and she is aware she is responsible for securing confidentiality on her end of the session. Kristi Hunter verbally consented to proceed.  Chief Complaint/HPI: Kristi Hunter was referred by Dr. [name]. The note for the initial appointment with Dr. [name] on February 22, 2020 indicated the following: "Kristi Hunter's habits were reviewed today and are as follows: her desired weight loss is 104 lbs, she started gaining weight when she worked, added 2-4 lbs a year, her heaviest weight ever was 280 pounds, she has significant food cravings issues, she skips meals frequently, she is frequently drinking liquids with calories, she frequently makes poor food choices, she has problems with excessive hunger, she frequently eats larger portions than normal, she has binge eating behaviors and she struggles with emotional eating." Kristi Hunter's Food and Mood (modified PHQ-9) score on February 22, 2020 was 18.  During today's appointment, Kristi Hunter was verbally administered a questionnaire assessing various behaviors related to emotional eating. Kristi Hunter  endorsed the following: experience food cravings on a regular basis, use food to help you cope with emotional situations, find food is comforting to you, overeat when you are angry or upset, overeat when you are  worried about something, overeat frequently when you are bored or lonely, overeat when you are alone, but eat much less when you are with other people and eat as a reward. She shared she craves candy and other sweets, noting she often eats sweets in their entirety instead of the recommended portion. Kristi Hunter described the current frequency of emotional eating as "3-4 times a week." In addition, Kristi Hunter denied a history of binge eating. Kristi Hunter denied a history of restricting food intake, purging and engagement in other compensatory strategies, and has never been diagnosed with an eating disorder. She also denied a history of treatment for emotional eating. However, she shared she "belonged to OA." Moreover, Kristi Hunter indicated seeing certain foods triggers emotional eating, whereas having structure or being out of the house makes emotional eating better. Furthermore, Kristi Hunter denied other problems of concern.    Mental Status Examination:  Appearance: well groomed and appropriate hygiene  Behavior: appropriate to circumstances Mood: euthymic Affect: mood congruent Speech: normal in rate, volume, and tone Eye Contact: appropriate Psychomotor Activity: appropriate Gait: unable to assess Thought Process: linear, logical, and goal directed  Thought Content/Perception: denies suicidal and homicidal ideation, plan, and intent and no hallucinations, delusions, bizarre thinking or behavior reported or observed Orientation: time, person, place, and purpose of appointment Memory/Concentration: memory, attention, language, and fund of knowledge intact  Insight/Judgment: good  Family & Psychosocial History: Kristi Hunter reported she is not in a relationship and she has three adult children. She  indicated she is currently retired, adding she was previously employed as a Engineer, maintenance (IT). Additionally, Kristi Hunter shared her highest level of education obtained is a bachelor's degree. Currently, Shynia's stated, "I'm very social." She shared about various friends that she made over the years. She shared her son, daughter in law, daughter, granddaughter, and great grandson reside in [name]. Moreover, Kristi Hunter stated she resides alone.   Medical History:  Past Medical History:  Diagnosis Date  . Anemia   . Anxiety   . Arthritis   . Back pain   . Cancer ([place])    uterine  . Chest pain   . Chicken pox   . Chronic kidney disease   . Constipation   . Emphysema of lung ([place])   . Glaucoma   . Hypertension   . Joint pain   . Lower extremity edema   . Lymphedema   . Osteoarthritis   . Prediabetes   . Shortness of breath   . Urine incontinence   . Uterine cancer ([place])   . UTI (urinary tract infection)   . Vertigo   . Vitamin B 12 deficiency    during pregnancy   Past Surgical History:  Procedure Laterality Date  . ABDOMINAL HYSTERECTOMY  2011  . REPLACEMENT TOTAL KNEE Right 2012   Current Outpatient Medications on File Prior to Visit  Medication Sig Dispense Refill  . calcium-vitamin D (OSCAL WITH D) 500-200 MG-UNIT tablet Take 1 tablet by mouth daily with breakfast.    . chlorthalidone (HYGROTON) 25 MG tablet TAKE 1 TABLET BY MOUTH EVERY DAY 90 tablet 0  . Cholecalciferol (VITAMIN D3 PO) Take 50 mcg by mouth.    Marland Kitchen GLUCOSAMINE-CHONDROITIN-VIT D3 PO Take 2,000 Units by mouth.    . Magnesium 500 MG CAPS Take 1 capsule by mouth daily.    . Multiple Vitamin (MULTIVITAMIN) tablet Take 1 tablet by mouth daily.    . naproxen (NAPROSYN) 500 MG tablet Take one twice daily as needed for pain with food. 60 tablet 0  .  naproxen sodium (ALEVE) 220 MG tablet Take 220 mg by mouth daily as needed.    Marland Kitchen omega-3 fish oil (MAXEPA) 1000 MG CAPS capsule Take 1 capsule by mouth daily.    . Turmeric 500 MG CAPS  Take 1 capsule by mouth daily.     No current facility-administered medications on file prior to visit.   Mental Health History: Kristi Hunter reported attending therapeutic services in college while she was going through a divorce and she had three young children. She also attended services with her daughter to help her adjust to the divorce. She noted she later attended therapeutic services 10 years ago due to concerns related to her granddaughter's well-being. Additionally, Kristi Hunter stated she attended therapeutic services in 2014 due to "anxiety attacks." Currently, she noted she takes a sleeping aide. Kristi Hunter reported there is no history of hospitalizations for psychiatric concerns. Kristi Hunter endorsed a family history of mental health related concerns. She stated her brother and paternal grandfather suffered from alcoholism. Kristi Hunter reported there is no history of trauma including psychological, physical  and sexual abuse, as well as neglect.   Kristi Hunter believes she thought about suicide when her husband left her twice around age 42. She denied a history of plan and intent. Kristi Hunter noted she last experienced suicidal ideation at age 36. The following protective factors were identified for Angella: future, religion, and family. If she were to become overwhelmed in the future, which is a sign that a crisis may occur, she identified the following coping skills she could engage in: socialize, allow 15 minute worry period, engage in breathing exercises, and call someone. It was recommended the aforementioned be written down and developed into a coping card for future reference; she agreed. Psychoeducation regarding the importance of reaching out to a trusted individual and/or utilizing emergency resources if there is a change in emotional status and/or there is an inability to ensure safety was provided. Maysoon's confidence in reaching out to a trusted individual and/or utilizing emergency resources  should there be an intensification in emotional status and/or there is an inability to ensure safety was assessed on a scale of one to ten where one is not confident and ten is extremely confident. She reported her confidence is a 10.   Yasmene described her typical mood lately as "pretty good." Aside from concerns noted above and endorsed on the PHQ-9 and GAD-7, Sidrah reported infrequent panic attacks, noting she averages two panic attacks a year. She added she is able to cope. She also discussed experiencing decreased motivation due to physical limitations. Additionally, Drucella stated ongoing worry related to family "strife." Revonda endorsed consuming "a glass and a half of wine with dinner occasionally." She denied current tobacco use. She denied illicit/recreational substance use. Regarding caffeine intake, Charletta reported consuming 1-2 cups of coffee daily. Furthermore, Jeiry indicated she is not experiencing the following: hallucinations and delusions, paranoia, symptoms of mania , social withdrawal and crying spells. She also denied current suicidal ideation, plan, and intent; history of and current homicidal ideation, plan, and intent; and history of and current engagement in self-harm.  The following strengths were reported by Kristi Hunter: helpful, caring, and reasonable. The following strengths were observed by this provider: ability to express thoughts and feelings during the therapeutic session, ability to establish and benefit from a therapeutic relationship, willingness to work toward established goal(s) with the clinic and ability to engage in reciprocal conversation.   Legal History: Tennille reported there is no history of legal involvement.   Structured Assessments  Results: The Patient Health Questionnaire-9 (PHQ-9) is a self-report measure that assesses symptoms and severity of depression over the course of the last two weeks. Soul obtained a score of 8 suggesting  mild depression. Kailen finds the endorsed symptoms to be very difficult. [0= Not at all; 1= Several days; 2= More than half the days; 3= Nearly every day] Little interest or pleasure in doing things 0  Feeling down, depressed, or hopeless 0  Trouble falling or staying asleep, or sleeping too much -"over active bladder" 3  Feeling tired or having little energy 3  Poor appetite or overeating 0  Feeling bad about yourself --- or that you are a failure or have let yourself or your family down 1  Trouble concentrating on things, such as reading the newspaper or watching television 1  Moving or speaking so slowly that other people could have noticed? Or the opposite --- being so fidgety or restless that you have been moving around a lot more than usual 0  Thoughts that you would be better off dead or hurting yourself in some way 0  PHQ-9 Score 8    The Generalized Anxiety Disorder-7 (GAD-7) is a brief self-report measure that assesses symptoms of anxiety over the course of the last two weeks. Mitsue obtained a score of 1 suggesting minimal anxiety. Larri finds the endorsed symptoms to be somewhat difficult. [0= Not at all; 1= Several days; 2= Over half the days; 3= Nearly every day] Feeling nervous, anxious, on edge 1  Not being able to stop or control worrying 0  Worrying too much about different things 0  Trouble relaxing 0  Being so restless that it's hard to sit still 0  Becoming easily annoyed or irritable 0  Feeling afraid as if something awful might happen 0  GAD-7 Score 1   Interventions:  Conducted a chart review Focused on rapport building Verbally administered PHQ-9 and GAD-7 for symptom monitoring Verbally administered Food & Mood questionnaire to assess various behaviors related to emotional eating Provided emphatic reflections and validation Collaborated with patient on a treatment goal  Psychoeducation provided regarding physical versus emotional hunger Conducted a  risk assessment Developed a coping card  Provisional DSM-5 Diagnosis(es): 307.59 (F50.8) Other Specified Feeding or Eating Disorder, Emotional Eating Behaviors  Plan: Jemya appears able and willing to participate as evidenced by collaboration on a treatment goal, engagement in reciprocal conversation, and asking questions as needed for clarification. The next appointment will be scheduled in approximately two weeks, which will be via MyChart Video Visit. The following treatment goal was established: increase coping skills. This provider will regularly review the treatment plan and medical chart to keep informed of status changes. Yee expressed understanding and agreement with the initial treatment plan of care. Brittnay will be sent a handout via e-mail to utilize between now and the next appointment to increase awareness of hunger patterns and subsequent eating. Kahlea provided verbal consent during today's appointment for this provider to send the handout via e-mail.

## 2020-02-24 DIAGNOSIS — M25562 Pain in left knee: Secondary | ICD-10-CM | POA: Diagnosis not present

## 2020-02-24 DIAGNOSIS — M7989 Other specified soft tissue disorders: Secondary | ICD-10-CM | POA: Diagnosis not present

## 2020-02-24 DIAGNOSIS — R3915 Urgency of urination: Secondary | ICD-10-CM | POA: Diagnosis not present

## 2020-02-24 DIAGNOSIS — I890 Lymphedema, not elsewhere classified: Secondary | ICD-10-CM | POA: Diagnosis not present

## 2020-02-24 DIAGNOSIS — R350 Frequency of micturition: Secondary | ICD-10-CM | POA: Diagnosis not present

## 2020-02-28 NOTE — Progress Notes (Signed)
Chief Complaint:   OBESITY Kristi Hunter (MR# [id number]) is a 79 y.o. female who presents for evaluation and treatment of obesity and related comorbidities. Current BMI is Body mass index is 48.83 kg/m. Kristi Hunter has been struggling with her weight for many years and has been unsuccessful in either losing weight, maintaining weight loss, or reaching her healthy weight goal.  Kristi Hunter is currently in the action stage of change and ready to dedicate time achieving and maintaining a healthier weight. Kristi Hunter is interested in becoming our patient and working on intensive lifestyle modifications including (but not limited to) diet and exercise for weight loss.  Kristi Hunter's habits were reviewed today and are as follows: her desired weight loss is 104 lbs, she started gaining weight when she worked, added 2-4 lbs a year, her heaviest weight ever was 280 pounds, she has significant food cravings issues, she skips meals frequently, she is frequently drinking liquids with calories, she frequently makes poor food choices, she has problems with excessive hunger, she frequently eats larger portions than normal, she has binge eating behaviors and she struggles with emotional eating.  Depression Screen Kristi Hunter's Food and Mood (modified PHQ-9) score was 18.  Depression screen PHQ 2/9 02/22/2020  Decreased Interest 2  Down, Depressed, Hopeless 2  PHQ - 2 Score 4  Altered sleeping 3  Tired, decreased energy 3  Change in appetite 2  Feeling bad or failure about yourself  3  Trouble concentrating 2  Moving slowly or fidgety/restless 1  Suicidal thoughts 0  PHQ-9 Score 18  Difficult doing work/chores Very difficult   Subjective:   1. Other fatigue Kristi Hunter does feel that her weight is causing her energy to be lower than it should be. Fatigue may be related to obesity, depression or many other causes. Labs will be ordered, and in the meanwhile, Kristi Hunter will focus on self care including  making healthy food choices, increasing physical activity and focusing on stress reduction.  2. Shortness of breath on exertion Kristi Hunter does feel that she gets out of breath more easily that she used to when she exercises. Kristi Hunter's shortness of breath appears to be obesity related and exercise induced. She has agreed to work on weight loss and gradually increase exercise to treat her exercise induced shortness of breath. Will continue to monitor closely.  3. Pre-diabetes Kristi Hunter has a history of pre-diabetes, and she is not on metformin. She wants to improve with diet.  4. Vitamin D deficiency Kristi Hunter is on Vit D OTC, and she notes fatigue. She has a history of osteoporosis.  5. Vitamin B 12 deficiency Kristi Hunter is on B12 supplementation, and she notes fatigue.  Assessment/Plan:   1. Other fatigue Kristi Hunter does feel that her weight is causing her energy to be lower than it should be. Fatigue may be related to obesity, depression or many other causes. Labs will be ordered, and in the meanwhile, Kristi Hunter will focus on self care including making healthy food choices, increasing physical activity and focusing on stress reduction.  - EKG 12-Lead - Comprehensive metabolic panel - CBC with Differential/Platelet - T3 - T4, free - TSH  2. Shortness of breath on exertion Kristi Hunter does feel that she gets out of breath more easily that she used to when she exercises. Kristi Hunter's shortness of breath appears to be obesity related and exercise induced. She has agreed to work on weight loss and gradually increase exercise to treat her exercise induced shortness of breath. Will continue to monitor closely.  -  CBC with Differential/Platelet  3. Pre-diabetes Kristi Hunter will start her Category plan, and will continue to work on weight loss, exercise, and decreasing simple carbohydrates to help decrease the risk of diabetes. We will check labs today.  - Comprehensive metabolic panel -  Hemoglobin A1c - Insulin, random  4. Vitamin D deficiency Low Vitamin D level contributes to fatigue and are associated with obesity, breast, and colon cancer. We will check labs today. Kristi Hunter will follow-up for routine testing of Vitamin D, at least 2-3 times per year to avoid over-replacement.  - VITAMIN D 25 Hydroxy (Vit-D Deficiency, Fractures)  5. Vitamin B 12 deficiency The diagnosis was reviewed with the patient. We will check labs today , and will continue to monitor. Kristi Hunter will start her Category 2 plan. Orders and follow up as documented in patient record.  - Vitamin B12  6. Depression screening Kristi Hunter had a positive depression screening. Depression is commonly associated with obesity and often results in emotional eating behaviors. We will monitor this closely and work on CBT to help improve the non-hunger eating patterns. Referral to Psychology may be required if no improvement is seen as she continues in our clinic.  7. Class 3 severe obesity with serious comorbidity and body mass index (BMI) of 45.0 to 49.9 in adult, unspecified obesity type Kristi Hunter is currently in the action stage of change and her goal is to continue with weight loss efforts. I recommend Kristi Hunter begin the structured treatment plan as follows:  She has agreed to the Category 2 Plan.  Exercise goals: No exercise has been prescribed for now, while we concentrate on nutritional changes.  Behavioral modification strategies: increasing lean protein intake and no skipping meals.  She was informed of the importance of frequent follow-up visits to maximize her success with intensive lifestyle modifications for her multiple health conditions. She was informed we would discuss her lab results at her next visit unless there is a critical issue that needs to be addressed sooner. Kristi Hunter agreed to keep her next visit at the agreed upon time to discuss these results.  Objective:   Blood pressure  135/78, pulse 72, temperature 97.7 F (36.5 C), temperature source Oral, height 5\' 2"  (1.575 m), weight 267 lb (121.1 kg), SpO2 96 %. Body mass index is 48.83 kg/m.  EKG: Normal sinus rhythm, rate 73 BPM.  Indirect Calorimeter completed today shows a VO2 of 248 and a REE of 1726.  Her calculated basal metabolic rate is 5638 thus her basal metabolic rate is better than expected.  General: Cooperative, alert, well developed, in no acute distress. HEENT: Conjunctivae and lids unremarkable. Cardiovascular: Regular rhythm.  Lungs: Normal work of breathing. Neurologic: No focal deficits.   Lab Results  Component Value Date   CREATININE 0.57 02/22/2020   BUN 18 02/22/2020   NA 140 02/22/2020   K 4.2 02/22/2020   CL 101 02/22/2020   CO2 28 02/22/2020   Lab Results  Component Value Date   ALT 19 02/22/2020   AST 25 02/22/2020   ALKPHOS 85 02/22/2020   BILITOT 0.3 02/22/2020   Lab Results  Component Value Date   HGBA1C 5.9 (H) 02/22/2020   HGBA1C 5.9 05/10/2019   HGBA1C 6.0 01/13/2019   Lab Results  Component Value Date   INSULIN 21.5 02/22/2020   Lab Results  Component Value Date   TSH 1.470 02/22/2020   Lab Results  Component Value Date   CHOL 151 01/13/2019   HDL 44.90 01/13/2019   LDLCALC 92  01/13/2019   TRIG 72.0 01/13/2019   CHOLHDL 3 01/13/2019   Lab Results  Component Value Date   WBC 8.1 02/22/2020   HGB 13.2 02/22/2020   HCT 41.3 02/22/2020   MCV 86 02/22/2020   PLT 332 02/22/2020   No results found for: IRON, TIBC, FERRITIN Obesity Behavioral Intervention Visit Documentation for Insurance:   Approximately 15 minutes were spent on the discussion below.  ASK: We discussed the diagnosis of obesity with Kristi Hunter today and Jazmarie agreed to give [name] permission to discuss obesity behavioral modification therapy today.  ASSESS: Cereniti has the diagnosis of obesity and her BMI today is 48.82. Sadeen is in the action stage of change.    ADVISE: Malonie was educated on the multiple health risks of obesity as well as the benefit of weight loss to improve her health. She was advised of the need for long term treatment and the importance of lifestyle modifications to improve her current health and to decrease her risk of future health problems.  AGREE: Multiple dietary modification options and treatment options were discussed and Dustin agreed to follow the recommendations documented in the above note.  ARRANGE: Miyonna was educated on the importance of frequent visits to treat obesity as outlined per CMS and USPSTF guidelines and agreed to schedule her next follow up appointment today.  Attestation Statements:   Reviewed by clinician on day of visit: allergies, medications, problem list, medical history, surgical history, family history, social history, and previous encounter notes.   I, Trixie Dredge, am acting as transcriptionist for [name], MD.  I have reviewed the above documentation for accuracy and completeness, and I agree with the above. - [name], MD

## 2020-02-29 DIAGNOSIS — R351 Nocturia: Secondary | ICD-10-CM | POA: Diagnosis not present

## 2020-02-29 DIAGNOSIS — N3946 Mixed incontinence: Secondary | ICD-10-CM | POA: Diagnosis not present

## 2020-02-29 DIAGNOSIS — M62838 Other muscle spasm: Secondary | ICD-10-CM | POA: Diagnosis not present

## 2020-02-29 DIAGNOSIS — M6281 Muscle weakness (generalized): Secondary | ICD-10-CM | POA: Diagnosis not present

## 2020-02-29 DIAGNOSIS — M6289 Other specified disorders of muscle: Secondary | ICD-10-CM | POA: Diagnosis not present

## 2020-02-29 DIAGNOSIS — R3915 Urgency of urination: Secondary | ICD-10-CM | POA: Diagnosis not present

## 2020-03-07 ENCOUNTER — Ambulatory Visit (INDEPENDENT_AMBULATORY_CARE_PROVIDER_SITE_OTHER): Payer: Medicare HMO | Admitting: Family Medicine

## 2020-03-07 ENCOUNTER — Other Ambulatory Visit: Payer: Self-pay

## 2020-03-07 ENCOUNTER — Encounter (INDEPENDENT_AMBULATORY_CARE_PROVIDER_SITE_OTHER): Payer: Self-pay | Admitting: Family Medicine

## 2020-03-07 VITALS — BP 110/73 | HR 80 | Temp 97.9°F | Ht 62.0 in | Wt 262.0 lb

## 2020-03-07 DIAGNOSIS — R3915 Urgency of urination: Secondary | ICD-10-CM | POA: Diagnosis not present

## 2020-03-07 DIAGNOSIS — E860 Dehydration: Secondary | ICD-10-CM

## 2020-03-07 DIAGNOSIS — R7303 Prediabetes: Secondary | ICD-10-CM | POA: Diagnosis not present

## 2020-03-07 DIAGNOSIS — Z6841 Body Mass Index (BMI) 40.0 and over, adult: Secondary | ICD-10-CM | POA: Diagnosis not present

## 2020-03-07 DIAGNOSIS — R350 Frequency of micturition: Secondary | ICD-10-CM | POA: Diagnosis not present

## 2020-03-08 ENCOUNTER — Telehealth (INDEPENDENT_AMBULATORY_CARE_PROVIDER_SITE_OTHER): Payer: Medicare HMO | Admitting: Psychology

## 2020-03-08 ENCOUNTER — Other Ambulatory Visit: Payer: Self-pay

## 2020-03-08 DIAGNOSIS — R69 Illness, unspecified: Secondary | ICD-10-CM | POA: Diagnosis not present

## 2020-03-08 DIAGNOSIS — F5089 Other specified eating disorder: Secondary | ICD-10-CM

## 2020-03-08 NOTE — Progress Notes (Signed)
Chief Complaint:   OBESITY Kristi Hunter is here to discuss her progress with her obesity treatment plan along with follow-up of her obesity related diagnoses. Kristi Hunter is on the Category 2 Plan and states she is following her eating plan approximately 85-90% of the time. Kristi Hunter states she is doing lymphedema clinic and exercises 2 times per week.  Today's visit was #: 2 Starting weight: 267 lbs Starting date: 02/22/2020 Today's weight: 262 lbs Today's date: 03/07/2020 Total lbs lost to date: 5 Total lbs lost since last in-office visit: 5  Interim History: Kristi Hunter has done well with weight loss on her eating plan. Her hunger is mostly controlled and she is satisfied with most of her choices. She had to eat out 1-2 times but she made good choices.  Subjective:   1. Dehydration Kristi Hunter has been limiting her water due to in part to urge independence. I discussed labs with the patient today.  2. Pre-diabetes Kristi Hunter's A1c is elevated at 5.9, and fasting insulin >5. She notes polyphagia especially in the morning. I discussed labs with the patient today.  Assessment/Plan:   1. Dehydration Kristi Hunter will continue to increase water and will follow with Urology.  2. Pre-diabetes Kristi Hunter will continue to work on weight loss, diet, exercise, and decreasing simple carbohydrates, and increasing protein to help decrease the risk of diabetes.   3. Class 3 severe obesity with serious comorbidity and body mass index (BMI) of 45.0 to 49.9 in adult, unspecified obesity type [facility]) Kristi Hunter is currently in the action stage of change. As such, her goal is to continue with weight loss efforts. She has agreed to the Category 2 Plan + lunch options.   Exercise goals: As is.  Behavioral modification strategies: increasing lean protein intake and keeping healthy foods in the home.  Kristi Hunter has agreed to follow-up with our clinic in 2 weeks. She was informed of the importance of frequent  follow-up visits to maximize her success with intensive lifestyle modifications for her multiple health conditions.   Objective:   Blood pressure 110/73, pulse 80, temperature 97.9 F (36.6 C), temperature source Oral, height 5\' 2"  (1.575 m), weight (!) 262 lb (118.8 kg), SpO2 96 %. Body mass index is 47.92 kg/m.  General: Cooperative, alert, well developed, in no acute distress. HEENT: Conjunctivae and lids unremarkable. Cardiovascular: Regular rhythm.  Lungs: Normal work of breathing. Neurologic: No focal deficits.   Lab Results  Component Value Date   CREATININE 0.57 02/22/2020   BUN 18 02/22/2020   NA 140 02/22/2020   K 4.2 02/22/2020   CL 101 02/22/2020   CO2 28 02/22/2020   Lab Results  Component Value Date   ALT 19 02/22/2020   AST 25 02/22/2020   ALKPHOS 85 02/22/2020   BILITOT 0.3 02/22/2020   Lab Results  Component Value Date   HGBA1C 5.9 (H) 02/22/2020   HGBA1C 5.9 05/10/2019   HGBA1C 6.0 01/13/2019   Lab Results  Component Value Date   INSULIN 21.5 02/22/2020   Lab Results  Component Value Date   TSH 1.470 02/22/2020   Lab Results  Component Value Date   CHOL 151 01/13/2019   HDL 44.90 01/13/2019   LDLCALC 92 01/13/2019   TRIG 72.0 01/13/2019   CHOLHDL 3 01/13/2019   Lab Results  Component Value Date   WBC 8.1 02/22/2020   HGB 13.2 02/22/2020   HCT 41.3 02/22/2020   MCV 86 02/22/2020   PLT 332 02/22/2020   No results found for: IRON,  TIBC, FERRITIN  Attestation Statements:   Reviewed by clinician on day of visit: allergies, medications, problem list, medical history, surgical history, family history, social history, and previous encounter notes.  Time spent on visit including pre-visit chart review and post-visit care and charting was 42 minutes.    I, Trixie Dredge, am acting as transcriptionist for [name], MD.  I have reviewed the above documentation for accuracy and completeness, and I agree with the above. -  [name], MD

## 2020-03-09 NOTE — Progress Notes (Signed)
°  Office: [phone]  /  Fax: [phone]    Date: March 23, 2020   Appointment Start Time: 2:12pm Duration: 30 minutes Provider: Glennie Isle, Psy.D. Type of Session: Individual Therapy  Location of Patient: Home Location of Provider: Provider's Home Type of Contact: Telepsychological Visit via MyChart Video Visit  Session Content: This provider called Kristi Hunter at 2:02pm as she did not present for the telepsychological appointment. A HIPAA compliant voicemail was left requesting a call back. She reportedly called the office back at 2:07pm. This provider spoke with her and agreed to meet as scheduled. Assistance on connecting was provided. As such, today's appointment was initiated 12 minutes late. Kristi Hunter is a 79 y.o. female presenting via [place] Visit for a follow-up appointment to address the previously established treatment goal of increasing coping skills. Today's appointment was a telepsychological visit due to COVID-19. Kristi Hunter provided verbal consent for today's telepsychological appointment and she is aware she is responsible for securing confidentiality on her end of the session. Prior to proceeding with today's appointment, Kristi Hunter's physical location at the time of this appointment was obtained as well a phone number she could be reached at in the event of technical difficulties. Kristi Hunter and this provider participated in today's telepsychological service. Of note, today's appointment was switched to a regular telephone call at 2:29pm with Kristi Hunter's verbal consent due to technical issues.   This provider conducted a brief check-in. Kristi Hunter shared she continues to go to physical therapy and lost six pounds recently. Emotional and physical hunger were reviewed. Psychoeducation regarding triggers for emotional eating was provided. Kristi Hunter was provided a handout, and encouraged to utilize the handout between now and the next appointment to increase awareness of  triggers and frequency. Kristi Hunter agreed. This provider also discussed behavioral strategies for specific triggers, such as placing the utensil down when conversing to avoid mindless eating. Kristi Hunter provided verbal consent during today's appointment for this provider to send a handout about triggers via e-mail. Kristi Hunter was receptive to today's appointment as evidenced by openness to sharing, responsiveness to feedback, and willingness to explore triggers for emotional eating.  Mental Status Examination:  Appearance: well groomed and appropriate hygiene  Behavior: appropriate to circumstances Mood: euthymic Affect: mood congruent Speech: normal in rate, volume, and tone Eye Contact: appropriate Psychomotor Activity: appropriate Gait: unable to assess Thought Process: linear, logical, and goal directed  Thought Content/Perception: no hallucinations, delusions, bizarre thinking or behavior reported or observed and no evidence of suicidal and homicidal ideation, plan, and intent Orientation: time, person, place, and purpose of appointment Memory/Concentration: memory, attention, language, and fund of knowledge intact  Insight/Judgment: good   Interventions:  Conducted a brief chart review Provided empathic reflections and validation Reviewed content from the previous session Employed supportive psychotherapy interventions to facilitate reduced distress and to improve coping skills with identified stressors Psychoeducation provided regarding triggers for emotional eating  DSM-5 Diagnosis(es): 307.59 (F50.8) Other Specified Feeding or Eating Disorder, Emotional Eating Behaviors  Treatment Goal & Progress: During the initial appointment with this provider, the following treatment goal was established: increase coping skills. Kristi Hunter has demonstrated progress in her goal as evidenced by increased awareness of hunger patterns.   Plan: Based on recent progress, frequency of appointments will  be reduced. As such, the next appointment will be scheduled in 2-3 weeks, which will be via MyChart Video Visit. The next session will focus on working towards the established treatment goal.

## 2020-03-10 DIAGNOSIS — I890 Lymphedema, not elsewhere classified: Secondary | ICD-10-CM | POA: Diagnosis not present

## 2020-03-10 DIAGNOSIS — M25562 Pain in left knee: Secondary | ICD-10-CM | POA: Diagnosis not present

## 2020-03-10 DIAGNOSIS — M7989 Other specified soft tissue disorders: Secondary | ICD-10-CM | POA: Diagnosis not present

## 2020-03-13 ENCOUNTER — Ambulatory Visit (INDEPENDENT_AMBULATORY_CARE_PROVIDER_SITE_OTHER): Payer: Medicare HMO | Admitting: Neurology

## 2020-03-13 ENCOUNTER — Other Ambulatory Visit: Payer: Self-pay

## 2020-03-13 DIAGNOSIS — I10 Essential (primary) hypertension: Secondary | ICD-10-CM

## 2020-03-13 DIAGNOSIS — J3089 Other allergic rhinitis: Secondary | ICD-10-CM

## 2020-03-13 DIAGNOSIS — G4733 Obstructive sleep apnea (adult) (pediatric): Secondary | ICD-10-CM | POA: Diagnosis not present

## 2020-03-13 DIAGNOSIS — R0683 Snoring: Secondary | ICD-10-CM

## 2020-03-13 DIAGNOSIS — M1710 Unilateral primary osteoarthritis, unspecified knee: Secondary | ICD-10-CM

## 2020-03-14 DIAGNOSIS — N3946 Mixed incontinence: Secondary | ICD-10-CM | POA: Diagnosis not present

## 2020-03-14 DIAGNOSIS — M6281 Muscle weakness (generalized): Secondary | ICD-10-CM | POA: Diagnosis not present

## 2020-03-14 DIAGNOSIS — R351 Nocturia: Secondary | ICD-10-CM | POA: Diagnosis not present

## 2020-03-14 DIAGNOSIS — M62838 Other muscle spasm: Secondary | ICD-10-CM | POA: Diagnosis not present

## 2020-03-14 DIAGNOSIS — M6289 Other specified disorders of muscle: Secondary | ICD-10-CM | POA: Diagnosis not present

## 2020-03-14 DIAGNOSIS — R3915 Urgency of urination: Secondary | ICD-10-CM | POA: Diagnosis not present

## 2020-03-16 DIAGNOSIS — R3915 Urgency of urination: Secondary | ICD-10-CM | POA: Diagnosis not present

## 2020-03-16 DIAGNOSIS — R350 Frequency of micturition: Secondary | ICD-10-CM | POA: Diagnosis not present

## 2020-03-20 ENCOUNTER — Other Ambulatory Visit: Payer: Self-pay

## 2020-03-20 ENCOUNTER — Ambulatory Visit (INDEPENDENT_AMBULATORY_CARE_PROVIDER_SITE_OTHER): Payer: Medicare HMO | Admitting: Family Medicine

## 2020-03-20 ENCOUNTER — Encounter (INDEPENDENT_AMBULATORY_CARE_PROVIDER_SITE_OTHER): Payer: Self-pay | Admitting: Family Medicine

## 2020-03-20 ENCOUNTER — Encounter: Payer: Self-pay | Admitting: Neurology

## 2020-03-20 VITALS — BP 110/65 | HR 80 | Temp 97.9°F | Ht 62.0 in | Wt 258.0 lb

## 2020-03-20 DIAGNOSIS — E860 Dehydration: Secondary | ICD-10-CM | POA: Diagnosis not present

## 2020-03-20 DIAGNOSIS — R7303 Prediabetes: Secondary | ICD-10-CM | POA: Diagnosis not present

## 2020-03-20 DIAGNOSIS — E559 Vitamin D deficiency, unspecified: Secondary | ICD-10-CM | POA: Diagnosis not present

## 2020-03-20 DIAGNOSIS — Z6841 Body Mass Index (BMI) 40.0 and over, adult: Secondary | ICD-10-CM | POA: Diagnosis not present

## 2020-03-20 NOTE — Progress Notes (Signed)
Chief Complaint:   OBESITY Kristi Hunter is here to discuss her progress with her obesity treatment plan along with follow-up of her obesity related diagnoses. Kristi Hunter is on the Category 2 Plan and states she is following her eating plan approximately 90% of the time. Kristi Hunter states she is walking in the pool for 60 minutes 2 days a week and lifting weights for 60 minutes 3-4 times per week.  Today's visit was #: 3 Starting weight: 267 lbs Starting date: 02/22/2020 Today's weight: 258 lbs Today's date: 03/20/2020 Total lbs lost to date: 9 lbs Total lbs lost since last in-office visit: 4 lbs  Interim History: Kristi Hunter was previously seen by Dr. Leafy Ro.  This is her first office visit with me. "I am not doing as well as I should with following the plan.  I crave chocolage and have to eat a whole bar or whole bag, etc.  When I go off plan I really go off plan (only for 1 meal or 1 item)"  She is not weighing her proteins.  She lost her Category 2 plan information.  Subjective:   1. Prediabetes Kristi Hunter has a diagnosis of prediabetes based on her elevated HgA1c and was informed this puts her at greater risk of developing diabetes. She continues to work on diet and exercise to decrease her risk of diabetes. She denies nausea or hypoglycemia.  Some hunger when she wakes up first thing in the morning, but during the day it is controlled.  She craves chocolate, but feels there is a large emotional component.  Lab Results  Component Value Date   HGBA1C 5.9 (H) 02/22/2020   Lab Results  Component Value Date   INSULIN 21.5 02/22/2020   2. Dehydration Only 2 bottles of water per day.  She has a urologist and urinary issues and nocturia.  3. Vitamin D deficiency Kristi Hunter's Vitamin D level was 42.1 on 02/22/2020. She is currently taking OTC vitamin D 2000 IU each day. She denies nausea, vomiting or muscle weakness.  Assessment/Plan:   1. Prediabetes Kristi Hunter will continue to work on  weight loss, exercise, and decreasing simple carbohydrates to help decrease the risk of diabetes.  Continue prudent nutritional plan, weight loss.  Stay away from simple carbs.  Increase protein and complex carbs.  2. Dehydration Recommend she increase her water intake/non-caloric, non-caffeine beverages during the day with nothing past 5-7 pm.  3. Vitamin D deficiency Low Vitamin D level contributes to fatigue and are associated with obesity, breast, and colon cancer. She agrees to continue to take prescription Vitamin D @50 ,000 IU every week and will follow-up for routine testing of Vitamin D, at least 2-3 times per year to avoid over-replacement.  Increase OTC vitamin D by 1000 IU per day.  Recheck levels in 3 months.  4. Class 3 severe obesity with serious comorbidity and body mass index (BMI) of 45.0 to 49.9 in adult, unspecified obesity type [facility]) Kristi Hunter is currently in the action stage of change. As such, her goal is to continue with weight loss efforts. She has agreed to the Category 2 Plan.   Exercise goals: As is.  Behavioral modification strategies: increasing lean protein intake (measure), decreasing simple carbohydrates (no whole potatoes), increasing water intake and planning for success.  Kristi Hunter has agreed to follow-up with our clinic in 2 weeks. She was informed of the importance of frequent follow-up visits to maximize her success with intensive lifestyle modifications for her multiple health conditions.   Objective:   Blood  pressure 110/65, pulse 80, temperature 97.9 F (36.6 C), height 5\' 2"  (1.575 m), weight 258 lb (117 kg), SpO2 96 %. Body mass index is 47.19 kg/m.  General: Cooperative, alert, well developed, in no acute distress. HEENT: Conjunctivae and lids unremarkable. Cardiovascular: Regular rhythm.  Lungs: Normal work of breathing. Neurologic: No focal deficits.   Lab Results  Component Value Date   CREATININE 0.57 02/22/2020   BUN 18 02/22/2020    NA 140 02/22/2020   K 4.2 02/22/2020   CL 101 02/22/2020   CO2 28 02/22/2020   Lab Results  Component Value Date   ALT 19 02/22/2020   AST 25 02/22/2020   ALKPHOS 85 02/22/2020   BILITOT 0.3 02/22/2020   Lab Results  Component Value Date   HGBA1C 5.9 (H) 02/22/2020   HGBA1C 5.9 05/10/2019   HGBA1C 6.0 01/13/2019   Lab Results  Component Value Date   INSULIN 21.5 02/22/2020   Lab Results  Component Value Date   TSH 1.470 02/22/2020   Lab Results  Component Value Date   CHOL 151 01/13/2019   HDL 44.90 01/13/2019   LDLCALC 92 01/13/2019   TRIG 72.0 01/13/2019   CHOLHDL 3 01/13/2019   Lab Results  Component Value Date   WBC 8.1 02/22/2020   HGB 13.2 02/22/2020   HCT 41.3 02/22/2020   MCV 86 02/22/2020   PLT 332 02/22/2020   Obesity Behavioral Intervention Documentation for Insurance:   Approximately 15 minutes were spent on the discussion below.  ASK: We discussed the diagnosis of obesity with Kristi Hunter today and Kristi Hunter agreed to give [name] permission to discuss obesity behavioral modification therapy today.  ASSESS: Kristi Hunter has the diagnosis of obesity and her BMI today is 47.3. Kristi Hunter is in the action stage of change.   ADVISE: Kristi Hunter was educated on the multiple health risks of obesity as well as the benefit of weight loss to improve her health. She was advised of the need for long term treatment and the importance of lifestyle modifications to improve her current health and to decrease her risk of future health problems.  AGREE: Multiple dietary modification options and treatment options were discussed and Kristi Hunter agreed to follow the recommendations documented in the above note.  ARRANGE: Kristi Hunter was educated on the importance of frequent visits to treat obesity as outlined per CMS and USPSTF guidelines and agreed to schedule her next follow up appointment today.  Attestation Statements:   Reviewed by clinician on day of visit: allergies,  medications, problem list, medical history, surgical history, family history, social history, and previous encounter notes.  I, Water quality scientist, CMA, am acting as Location manager for [employer], DO.  I have reviewed the above documentation for accuracy and completeness, and I agree with the above. Mellody Dance, DO

## 2020-03-22 ENCOUNTER — Telehealth: Payer: Self-pay | Admitting: Neurology

## 2020-03-22 ENCOUNTER — Other Ambulatory Visit: Payer: Self-pay | Admitting: Neurology

## 2020-03-22 DIAGNOSIS — G4733 Obstructive sleep apnea (adult) (pediatric): Secondary | ICD-10-CM | POA: Insufficient documentation

## 2020-03-22 DIAGNOSIS — I890 Lymphedema, not elsewhere classified: Secondary | ICD-10-CM | POA: Diagnosis not present

## 2020-03-22 DIAGNOSIS — J309 Allergic rhinitis, unspecified: Secondary | ICD-10-CM | POA: Insufficient documentation

## 2020-03-22 DIAGNOSIS — M25562 Pain in left knee: Secondary | ICD-10-CM | POA: Diagnosis not present

## 2020-03-22 NOTE — Progress Notes (Signed)
  There is moderate sleep apnea present, at an AHI of 22/h with strong REM dependence. REM AHI of 53/h.  Recommendations:    REM dependent sleep apnea- this is most effectively addressed by positive airway pressure therapy. dental devices and inspire technology do not work well for this form of apnea.

## 2020-03-22 NOTE — Telephone Encounter (Signed)
I called pt. I advised pt that Dr. Brett Fairy reviewed their sleep study results and found that pt has sleep apnea. Dr. Brett Fairy recommends that pt starts auto CPAP. I reviewed PAP compliance expectations with the pt. Pt is agreeable to starting a CPAP. I advised pt that an order will be sent to a DME, [facility] and [facility] will call the pt within about one week after they file with the pt's insurance. [facility]) will show the pt how to use the machine, fit for masks, and troubleshoot the CPAP if needed. A follow up appt was made for insurance purposes with Debbora Presto, NP on Nov 9,2021 at 9:30 am . Pt verbalized understanding to arrive 15 minutes early and bring their CPAP. A letter with all of this information in it will be mailed to the pt as a reminder. I verified with the pt that the address we have on file is correct. Pt verbalized understanding of results. Pt had no questions at this time but was encouraged to call back if questions arise. I have sent the order to [facility]) and have received confirmation that they have received the order.

## 2020-03-22 NOTE — Procedures (Signed)
  Patient Information     First Name: Kristi Last Name: Rashay Hunter: [id number]  Birth Date: [birth year] Age: 79 Gender: Female  Referring Provider: Mortimer Fries, MD BMI: 45.9 (W=269 lb, H=5' 4'')  Neck Circ.:  27 '' [place]:    NA   Sleep Study Information    Study Date: 03/14/20 S/H/A Version: 001.001.001.001 / 4.1.1528 / 77     History:    [name] is a 79 year- old Caucasian female patient originally from [place] and seen here upon a referral on 01/24/2020.  Chief concern: Pt reports snoring is present.   I have the pleasure of seeing [name], a right -handed Caucasian female with a possible sleep disorder. She has a past medical history of Arthritis, Cancer ([place]), Chicken pox, Chronic kidney disease, Emphysema of lung ([place]), Glaucoma, Hypertension, Urine incontinence, and UTI (urinary tract infection). polyuria, Poly arthritis, pain in the morning, stiffness.  Knee replacement in the right - lymphedema. Hand pain. Hip pain.   Summary & Diagnosis:      There is moderate sleep apnea present, at an AHI of 22/h with strong REM dependence. REM AHI of 53/h.  Recommendations:     REM dependent sleep apnea- this is most effectively addressed by positive airway pressure therapy. dental devices and inspire technology do not work well for this form of apnea.   Auto CPAP is recommended at 6-16 cm water pressure with heated  humidity and an EPR of 2 cm water. The patient should be fitted for a mask of her comfort.    Interpreting Physician:  Larey Seat, MD             Sleep Summary  Oxygen Saturation Statistics   Start Study Time: End Study Time: Total Recording Time:  12:15:30AM 7:53:44 AM 7 hrs, 80min  Total Sleep Time % REM of Sleep Time:  6 hrs, 30 min 20.2    Mean: 91 Minimum: 72 Maximum: 97  Mean of Desaturations Nadirs (%):   83  Oxygen Desatur. %:   4-9 10-20 >20 Total  Events Number Total    55  28 66.3 33.7  0 0.0  83 100.0  Oxygen Saturation: <90  <=88 <85 <80 <70  Duration (minutes): Sleep % 41.4 10.6 33.3 15.6 8.5 4.0 4.0 1.0 0.0 0.0     Respiratory Indices      Total Events REM NREM All Night  pRDI:  169  pAHI:  134 ODI:  83  pAHIc:  0  % CSR: 0.0 53.1 52.3 43.1 0.0 20.9 13.8 5.6 0.0 27.7 22.0 13.6 0.0       Pulse Rate Statistics during Sleep (BPM)      Mean: 70 Minimum: 54 Maximum: 110    Indices are calculated using technically valid sleep time of  6 hrs, 5 min. Central-Indices are calculated using technically valid sleep time of  5  hrs, 38 min. pRDI/pAHI are calculated using oxi desaturations ? 3%  Body Position Statistics  Position Supine Prone Right Left Non-Supine  Sleep (min) 332.2 0.0 0.0 58.0 58.0  Sleep % 85.1 0.0 0.0 14.9 14.9  pRDI 30.3 N/A N/A 13.7 13.7  pAHI 23.7 N/A N/A 12.7 12.7  ODI 14.8 N/A N/A 7.4 7.4     Snoring Statistics Snoring Level (dB) >40 >50 >60 >70 >80 >Threshold (45)  Sleep (min) 59.1 3.5 1.1 0.0 0.0 6.1  Sleep % 15.1 0.9 0.3 0.0 0.0 1.6    Mean: 40 dB

## 2020-03-23 ENCOUNTER — Other Ambulatory Visit: Payer: Self-pay

## 2020-03-23 ENCOUNTER — Telehealth (INDEPENDENT_AMBULATORY_CARE_PROVIDER_SITE_OTHER): Payer: Self-pay | Admitting: Psychology

## 2020-03-23 ENCOUNTER — Telehealth (INDEPENDENT_AMBULATORY_CARE_PROVIDER_SITE_OTHER): Payer: Medicare HMO | Admitting: Psychology

## 2020-03-23 DIAGNOSIS — R3915 Urgency of urination: Secondary | ICD-10-CM | POA: Diagnosis not present

## 2020-03-23 DIAGNOSIS — R69 Illness, unspecified: Secondary | ICD-10-CM | POA: Diagnosis not present

## 2020-03-23 DIAGNOSIS — F5089 Other specified eating disorder: Secondary | ICD-10-CM

## 2020-03-23 NOTE — Telephone Encounter (Signed)
°  Office: [phone]  /  Fax: [phone]  Date of Call: March 23, 2020  Time of Call: 2:02pm Provider: Glennie Isle, PsyD  CONTENT: This provider called [name] to check-in as she did not present for today's MyChart Video Visit appointment at 2:00pm. A HIPAA compliant voicemail was left requesting a call back. Of note, this provider stayed on the MyChart Video Visit appointment for 5 minutes prior to signing off per the clinic's grace period policy.    PLAN: This provider will wait for Mayra to call back. No further follow-up planned by this provider.

## 2020-03-27 DIAGNOSIS — Z0100 Encounter for examination of eyes and vision without abnormal findings: Secondary | ICD-10-CM | POA: Diagnosis not present

## 2020-03-28 NOTE — Progress Notes (Unsigned)
Office: [phone]  /  Fax: [phone]    Date: April 11, 2020   Appointment Start Time: *** Duration: *** minutes Provider: Glennie Isle, Psy.D. Type of Session: Individual Therapy  Location of Patient: {gbptloc:23249} Location of Provider: {Location of Service:22491} Type of Contact: Telepsychological Visit via MyChart Video Visit  Session Content: Kristi Hunter is a 79 y.o. female presenting for a follow-up appointment to address the previously established treatment goal of increasing coping skills. Today's appointment was a telepsychological visit due to COVID-19. [name] provided verbal consent for today's telepsychological appointment and she is aware she is responsible for securing confidentiality on her end of the session. Prior to proceeding with today's appointment, [name]'s physical location at the time of this appointment was obtained as well a phone number she could be reached at in the event of technical difficulties. Chandani and this provider participated in today's telepsychological service.   This provider conducted a brief check-in and verbally administered the PHQ-9 and GAD-7. *** [name] was receptive to today's appointment as evidenced by openness to sharing, responsiveness to feedback, and {gbreceptiveness:23401}.  Mental Status Examination:  Appearance: {Appearance:22431} Behavior: {Behavior:22445} Mood: {gbmood:21757} Affect: {Affect:22436} Speech: {Speech:22432} Eye Contact: {Eye Contact:22433} Psychomotor Activity: {Motor Activity:22434} Gait: {gbgait:23404} Thought Process: {thought process:22448}  Thought Content/Perception: {disturbances:22451} Orientation: {Orientation:22437} Memory/Concentration: {gbcognition:22449} Insight/Judgment: {Insight:22446}  Structured Assessments Results: The Patient Health Questionnaire-9 (PHQ-9) is a self-report measure that assesses symptoms and severity of depression over the course of the last two weeks.  Ia obtained a score of *** suggesting {GBPHQ9SEVERITY:21752}. [name] finds the endorsed symptoms to be {gbphq9difficulty:21754}. [0= Not at all; 1= Several days; 2= More than half the days; 3= Nearly every day] Little interest or pleasure in doing things ***  Feeling down, depressed, or hopeless ***  Trouble falling or staying asleep, or sleeping too much ***  Feeling tired or having little energy ***  Poor appetite or overeating ***  Feeling bad about yourself --- or that you are a failure or have let yourself or your family down ***  Trouble concentrating on things, such as reading the newspaper or watching television ***  Moving or speaking so slowly that other people could have noticed? Or the opposite --- being so fidgety or restless that you have been moving around a lot more than usual ***  Thoughts that you would be better off dead or hurting yourself in some way ***  PHQ-9 Score ***    The Generalized Anxiety Disorder-7 (GAD-7) is a brief self-report measure that assesses symptoms of anxiety over the course of the last two weeks. [name] obtained a score of *** suggesting {gbgad7severity:21753}. [name] finds the endorsed symptoms to be {gbphq9difficulty:21754}. 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  [name] has demonstrated progress in her goal as evidenced by {gbtxprogress:22839}. Vanity also {gbtxprogress2:22951}.  Plan: The next appointment will be scheduled in {gbweeks:21758},  which will be {gbtxmodality:23402}. The next session will focus on {Plan for Next Appointment:23400}.

## 2020-03-30 DIAGNOSIS — R3915 Urgency of urination: Secondary | ICD-10-CM | POA: Diagnosis not present

## 2020-04-06 DIAGNOSIS — R350 Frequency of micturition: Secondary | ICD-10-CM | POA: Diagnosis not present

## 2020-04-06 DIAGNOSIS — R3915 Urgency of urination: Secondary | ICD-10-CM | POA: Diagnosis not present

## 2020-04-11 ENCOUNTER — Ambulatory Visit (INDEPENDENT_AMBULATORY_CARE_PROVIDER_SITE_OTHER): Payer: Medicare HMO | Admitting: Family Medicine

## 2020-04-11 ENCOUNTER — Encounter (INDEPENDENT_AMBULATORY_CARE_PROVIDER_SITE_OTHER): Payer: Self-pay | Admitting: Family Medicine

## 2020-04-11 ENCOUNTER — Telehealth (INDEPENDENT_AMBULATORY_CARE_PROVIDER_SITE_OTHER): Payer: Medicare HMO | Admitting: Psychology

## 2020-04-11 ENCOUNTER — Other Ambulatory Visit: Payer: Self-pay

## 2020-04-11 VITALS — BP 113/74 | HR 74 | Temp 97.5°F | Ht 62.0 in | Wt 259.0 lb

## 2020-04-11 DIAGNOSIS — Z6841 Body Mass Index (BMI) 40.0 and over, adult: Secondary | ICD-10-CM

## 2020-04-11 DIAGNOSIS — G4733 Obstructive sleep apnea (adult) (pediatric): Secondary | ICD-10-CM | POA: Diagnosis not present

## 2020-04-11 DIAGNOSIS — I10 Essential (primary) hypertension: Secondary | ICD-10-CM

## 2020-04-13 DIAGNOSIS — R350 Frequency of micturition: Secondary | ICD-10-CM | POA: Diagnosis not present

## 2020-04-13 DIAGNOSIS — R3915 Urgency of urination: Secondary | ICD-10-CM | POA: Diagnosis not present

## 2020-04-13 NOTE — Telephone Encounter (Signed)
Pt called have not received a call from DME about CPAP machine. Gave Pt [facility] contact information.

## 2020-04-14 DIAGNOSIS — I739 Peripheral vascular disease, unspecified: Secondary | ICD-10-CM | POA: Diagnosis not present

## 2020-04-14 DIAGNOSIS — M1990 Unspecified osteoarthritis, unspecified site: Secondary | ICD-10-CM | POA: Diagnosis not present

## 2020-04-14 DIAGNOSIS — R69 Illness, unspecified: Secondary | ICD-10-CM | POA: Diagnosis not present

## 2020-04-14 DIAGNOSIS — G4700 Insomnia, unspecified: Secondary | ICD-10-CM | POA: Diagnosis not present

## 2020-04-14 DIAGNOSIS — Z008 Encounter for other general examination: Secondary | ICD-10-CM | POA: Diagnosis not present

## 2020-04-14 DIAGNOSIS — I10 Essential (primary) hypertension: Secondary | ICD-10-CM | POA: Diagnosis not present

## 2020-04-14 DIAGNOSIS — R32 Unspecified urinary incontinence: Secondary | ICD-10-CM | POA: Diagnosis not present

## 2020-04-14 DIAGNOSIS — G8929 Other chronic pain: Secondary | ICD-10-CM | POA: Diagnosis not present

## 2020-04-14 DIAGNOSIS — Z6841 Body Mass Index (BMI) 40.0 and over, adult: Secondary | ICD-10-CM | POA: Diagnosis not present

## 2020-04-18 ENCOUNTER — Other Ambulatory Visit: Payer: Self-pay

## 2020-04-18 ENCOUNTER — Encounter: Payer: Self-pay | Admitting: Family Medicine

## 2020-04-18 ENCOUNTER — Ambulatory Visit (INDEPENDENT_AMBULATORY_CARE_PROVIDER_SITE_OTHER): Payer: Medicare HMO | Admitting: Family Medicine

## 2020-04-18 VITALS — BP 126/78 | HR 77 | Temp 96.1°F | Ht 62.0 in | Wt 261.0 lb

## 2020-04-18 DIAGNOSIS — R7303 Prediabetes: Secondary | ICD-10-CM | POA: Diagnosis not present

## 2020-04-18 DIAGNOSIS — G4733 Obstructive sleep apnea (adult) (pediatric): Secondary | ICD-10-CM

## 2020-04-18 DIAGNOSIS — M25562 Pain in left knee: Secondary | ICD-10-CM | POA: Diagnosis not present

## 2020-04-18 DIAGNOSIS — Z6841 Body Mass Index (BMI) 40.0 and over, adult: Secondary | ICD-10-CM

## 2020-04-18 DIAGNOSIS — I10 Essential (primary) hypertension: Secondary | ICD-10-CM | POA: Diagnosis not present

## 2020-04-18 LAB — BASIC METABOLIC PANEL
BUN: 19 mg/dL (ref 6–23)
CO2: 31 mEq/L (ref 19–32)
Calcium: 9.3 mg/dL (ref 8.4–10.5)
Chloride: 99 mEq/L (ref 96–112)
Creatinine, Ser: 0.65 mg/dL (ref 0.40–1.20)
GFR: 88.45 mL/min (ref >60.00)
Glucose, Bld: 89 mg/dL (ref 70–99)
Potassium: 3.4 mEq/L — ABNORMAL LOW (ref 3.5–5.1)
Sodium: 139 mEq/L (ref 135–145)

## 2020-04-18 MED ORDER — CHLORTHALIDONE 25 MG PO TABS: 25.0000 mg | ORAL_TABLET | Freq: Every day | ORAL | 1 refills | Status: DC

## 2020-04-18 NOTE — Progress Notes (Signed)
Chief Complaint:   OBESITY Kirsi is here to discuss her progress with her obesity treatment plan along with follow-up of her obesity related diagnoses. Nayelly is on the Category 2 Plan and states she is following her eating plan approximately 85-90% of the time. Carman states she is in the pool, doing water aerobics for 30-40 minutes 2-5 times per week, and silver sneakers for 30 minutes 2 times per week.  Today's visit was #: 4 Starting weight: 267 lbs Starting date: 02/22/2020 Today's weight: 259 lbs Today's date: 04/11/2020 Total lbs lost to date: 8 Total lbs lost since last in-office visit: 0  Interim History: Naylah's weight loss has stalled, and she is getting frustrated. She is trying to follow her plan, and she has increased her exercise.  Subjective:   1. Essential hypertension Deanne is on medications, and she is working on weight loss. Her obstructive sleep apnea is not yet controlled, but her blood pressure is improving and at goal.  2. OSA (obstructive sleep apnea) Meena was diagnosed with obstructive sleep apnea, but she has not been contacted by the DME company even though it has been over 1 month.  Assessment/Plan:   1. Essential hypertension Myana will continue chlorthalidone, and will continue working on diet, healthy weight loss and exercise to improve blood pressure control. We will watch for signs of hypotension as she continues her lifestyle modifications.  2. OSA (obstructive sleep apnea) Intensive lifestyle modifications are the first line treatment for this issue. We discussed several lifestyle modifications today. Grazia is to contact [facility] to get the DME company to contact her. She will continue to work on diet, exercise and weight loss efforts. We will continue to monitor. Orders and follow up as documented in patient record.   3. Class 3 severe obesity with serious comorbidity and body mass index  (BMI) of 45.0 to 49.9 in adult, unspecified obesity type [facility]) Trecia is currently in the action stage of change. As such, her goal is to continue with weight loss efforts. She has agreed to the Category 2 Plan.   Tish is to do Category 2 plan for 6 days a week, but she wants to do protein shakes 1 time per week.  Exercise goals: As is.  Behavioral modification strategies: increasing lean protein intake and decreasing simple carbohydrates.  Laraya has agreed to follow-up with our clinic in 3 to 4 weeks. She was informed of the importance of frequent follow-up visits to maximize her success with intensive lifestyle modifications for her multiple health conditions.   Objective:   Blood pressure 113/74, pulse 74, temperature (!) 97.5 F (36.4 C), height 5\' 2"  (1.575 m), weight 259 lb (117.5 kg), SpO2 96 %. Body mass index is 47.37 kg/m.  General: Cooperative, alert, well developed, in no acute distress. HEENT: Conjunctivae and lids unremarkable. Cardiovascular: Regular rhythm.  Lungs: Normal work of breathing. Neurologic: No focal deficits.   Lab Results  Component Value Date   CREATININE 0.57 02/22/2020   BUN 18 02/22/2020   NA 140 02/22/2020   K 4.2 02/22/2020   CL 101 02/22/2020   CO2 28 02/22/2020   Lab Results  Component Value Date   ALT 19 02/22/2020   AST 25 02/22/2020   ALKPHOS 85 02/22/2020   BILITOT 0.3 02/22/2020   Lab Results  Component Value Date   HGBA1C 5.9 (H) 02/22/2020   HGBA1C 5.9 05/10/2019   HGBA1C 6.0 01/13/2019   Lab Results  Component Value Date  INSULIN 21.5 02/22/2020   Lab Results  Component Value Date   TSH 1.470 02/22/2020   Lab Results  Component Value Date   CHOL 151 01/13/2019   HDL 44.90 01/13/2019   LDLCALC 92 01/13/2019   TRIG 72.0 01/13/2019   CHOLHDL 3 01/13/2019   Lab Results  Component Value Date   WBC 8.1 02/22/2020   HGB 13.2 02/22/2020   HCT 41.3 02/22/2020   MCV 86 02/22/2020   PLT 332 02/22/2020     No results found for: IRON, TIBC, FERRITIN  Attestation Statements:   Reviewed by clinician on day of visit: allergies, medications, problem list, medical history, surgical history, family history, social history, and previous encounter notes.  Time spent on visit including pre-visit chart review and post-visit care and charting was 32 minutes.    I, Trixie Dredge, am acting as transcriptionist for [name], MD.  I have reviewed the above documentation for accuracy and completeness, and I agree with the above. -  [name], MD

## 2020-04-18 NOTE — Progress Notes (Signed)
Established Patient Office Visit  Subjective:  Patient ID: Kristi Hunter, female    DOB: 02-02-1941  Age: 79 y.o. MRN: [id number]  CC:  Chief Complaint  Patient presents with  . Follow-up    3 month f/u insomnia, HTN, knee pain, no concerns.      HPI Kristi Hunter presents for follow-up of hypertension, obesity, and insomnia.  Blood pressure has been well controlled with the chlorthalidone.  She is working with weight loss management for her obesity.  She knows that it is important for her left knee pain.  She informs me that her left knee pain was due to her lymphedema more than anything else.  She went to a physical therapist who helped her with this.  She is seeing urology and they are trying acupuncture.  It is helping her nighttime urinary frequency and she is sleeping much better.  Past Medical History:  Diagnosis Date  . Anemia   . Anxiety   . Arthritis   . Back pain   . Cancer ([place])    uterine  . Chest pain   . Chicken pox   . Chronic kidney disease   . Constipation   . Emphysema of lung ([place])   . Glaucoma   . Hypertension   . Joint pain   . Lower extremity edema   . Lymphedema   . Osteoarthritis   . Prediabetes   . Shortness of breath   . Urine incontinence   . Uterine cancer ([place])   . UTI (urinary tract infection)   . Vertigo   . Vitamin B 12 deficiency    during pregnancy    Past Surgical History:  Procedure Laterality Date  . ABDOMINAL HYSTERECTOMY  2011  . REPLACEMENT TOTAL KNEE Right 2012    Family History  Problem Relation Age of Onset  . Cancer Father        skin  . Alcohol abuse Father   . Asthma Father   . COPD Father   . Heart attack Father   . Heart disease Father   . Obesity Father   . Cancer Maternal Grandfather 97       colon cancer  . Heart attack Mother   . Heart disease Mother   . Hyperlipidemia Mother   . Hypertension Mother   . Stroke Mother   . Miscarriages / [name] Mother   . Diabetes Mother   . Thyroid  disease Mother   . Cancer Mother   . Obesity Mother   . Alcohol abuse Brother   . Arthritis Brother   . Drug abuse Brother   . Depression Daughter   . Hypertension Daughter   . Mental illness Daughter   . Depression Son   . Arthritis Maternal Grandmother   . Diabetes Maternal Grandmother   . Hyperlipidemia Maternal Grandmother   . Alcohol abuse Paternal Grandfather   . Arthritis Paternal Grandfather   . Cirrhosis Paternal Grandfather   . Alcohol abuse Brother   . Breast cancer Paternal Aunt     Social History   Socioeconomic History  . Marital status: Widowed    Spouse name: Not on file  . Number of children: 3  . Years of education: Not on file  . Highest education level: Not on file  Occupational History  . Occupation: Reitired Engineer, maintenance (IT)    Comment: Retired Engineer, maintenance (IT)  Tobacco Use  . Smoking status: Former Smoker    Types: Cigarettes    Quit date: 08/12/1982    Years since  quitting: 37.7  . Smokeless tobacco: Never Used  Vaping Use  . Vaping Use: Never used  Substance and Sexual Activity  . Alcohol use: Yes    Alcohol/week: 1.0 standard drink    Types: 1 Glasses of wine per week    Comment: every 1-2 wks.   . Drug use: Never  . Sexual activity: Not on file  Other Topics Concern  . Not on file  Social History Narrative  . Not on file   Social Determinants of Health   Financial Resource Strain:   . Difficulty of Paying Living Expenses: Not on file  Food Insecurity:   . Worried About Charity fundraiser in the Last Year: Not on file  . Ran Out of Food in the Last Year: Not on file  Transportation Needs:   . Lack of Transportation (Medical): Not on file  . Lack of Transportation (Non-Medical): Not on file  Physical Activity:   . Days of Exercise per Week: Not on file  . Minutes of Exercise per Session: Not on file  Stress:   . Feeling of Stress : Not on file  Social Connections:   . Frequency of Communication with Friends and Family: Not on file  . Frequency of  Social Gatherings with Friends and Family: Not on file  . Attends Religious Services: Not on file  . Active Member of Clubs or Organizations: Not on file  . Attends Archivist Meetings: Not on file  . Marital Status: Not on file  Intimate Partner Violence:   . Fear of Current or Ex-Partner: Not on file  . Emotionally Abused: Not on file  . Physically Abused: Not on file  . Sexually Abused: Not on file    Outpatient Medications Prior to Visit  Medication Sig Dispense Refill  . calcium-vitamin D (OSCAL WITH D) 500-200 MG-UNIT tablet Take 1 tablet by mouth daily with breakfast.    . Cholecalciferol (VITAMIN D3 PO) Take 1,000 Units by mouth daily.     Marland Kitchen GLUCOSAMINE-CHONDROITIN-VIT D3 PO Take 2,000 Units by mouth.    . Magnesium 500 MG CAPS Take 1 capsule by mouth daily.    . Multiple Vitamin (MULTIVITAMIN) tablet Take 1 tablet by mouth daily.    . naproxen (NAPROSYN) 500 MG tablet Take one twice daily as needed for pain with food. 60 tablet 0  . naproxen sodium (ALEVE) 220 MG tablet Take 220 mg by mouth daily as needed.    Marland Kitchen PROLIA 60 MG/ML SOSY injection     . traZODone (DESYREL) 50 MG tablet     . Turmeric 500 MG CAPS Take 1 capsule by mouth daily.    . chlorthalidone (HYGROTON) 25 MG tablet TAKE 1 TABLET BY MOUTH EVERY DAY 90 tablet 0  . omega-3 fish oil (MAXEPA) 1000 MG CAPS capsule Take 1 capsule by mouth daily. (Patient not taking: Reported on 04/18/2020)     No facility-administered medications prior to visit.    Allergies  Allergen Reactions  . Percocet [Oxycodone-Acetaminophen]     ROS Review of Systems  Constitutional: Negative.   HENT: Negative.   Respiratory: Negative.   Cardiovascular: Negative.   Gastrointestinal: Negative.   Genitourinary: Negative for frequency.  Musculoskeletal: Positive for arthralgias and gait problem.  Allergic/Immunologic: Negative for immunocompromised state.  Neurological: Negative for light-headedness and numbness.   Hematological: Does not bruise/bleed easily.  Psychiatric/Behavioral: Negative.       Objective:    Physical Exam Vitals and nursing note reviewed.  Constitutional:  General: She is not in acute distress.    Appearance: Normal appearance. She is obese. She is not ill-appearing, toxic-appearing or diaphoretic.  HENT:     Head: Normocephalic and atraumatic.     Right Ear: External ear normal.     Left Ear: External ear normal.  Eyes:     General:        Right eye: No discharge.        Left eye: No discharge.     Conjunctiva/sclera: Conjunctivae normal.  Cardiovascular:     Rate and Rhythm: Normal rate and regular rhythm.  Pulmonary:     Effort: Pulmonary effort is normal.     Breath sounds: Normal breath sounds.  Musculoskeletal:     Right lower leg: No edema.     Left lower leg: No edema.  Neurological:     Mental Status: She is alert and oriented to person, place, and time.  Psychiatric:        Mood and Affect: Mood normal.        Behavior: Behavior normal.     BP 126/78   Pulse 77   Temp (!) 96.1 F (35.6 C) (Temporal)   Ht 5\' 2"  (1.575 m)   Wt 261 lb (118.4 kg)   SpO2 94%   BMI 47.74 kg/m  Wt Readings from Last 3 Encounters:  04/18/20 261 lb (118.4 kg)  04/11/20 259 lb (117.5 kg)  03/20/20 258 lb (117 kg)     Health Maintenance Due  Topic Date Due  . Hepatitis C Screening  Never done  . TETANUS/TDAP  Never done  . PNA vac Low Risk Adult (1 of 2 - PCV13) Never done  . INFLUENZA VACCINE  03/12/2020    There are no preventive care reminders to display for this patient.  Lab Results  Component Value Date   TSH 1.470 02/22/2020   Lab Results  Component Value Date   WBC 8.1 02/22/2020   HGB 13.2 02/22/2020   HCT 41.3 02/22/2020   MCV 86 02/22/2020   PLT 332 02/22/2020   Lab Results  Component Value Date   NA 140 02/22/2020   K 4.2 02/22/2020   CO2 28 02/22/2020   GLUCOSE 85 02/22/2020   BUN 18 02/22/2020   CREATININE 0.57 02/22/2020    BILITOT 0.3 02/22/2020   ALKPHOS 85 02/22/2020   AST 25 02/22/2020   ALT 19 02/22/2020   PROT 6.5 02/22/2020   ALBUMIN 3.7 02/22/2020   CALCIUM 8.9 02/22/2020   GFR 90.18 10/12/2019   Lab Results  Component Value Date   CHOL 151 01/13/2019   Lab Results  Component Value Date   HDL 44.90 01/13/2019   Lab Results  Component Value Date   LDLCALC 92 01/13/2019   Lab Results  Component Value Date   TRIG 72.0 01/13/2019   Lab Results  Component Value Date   CHOLHDL 3 01/13/2019   Lab Results  Component Value Date   HGBA1C 5.9 (H) 02/22/2020      Assessment & Plan:   Problem List Items Addressed This Visit      Cardiovascular and Mediastinum   Essential hypertension - Primary   Relevant Medications   chlorthalidone (HYGROTON) 25 MG tablet   Other Relevant Orders   Basic metabolic panel     Respiratory   OSA (obstructive sleep apnea)     Other   Left knee pain   Class 3 severe obesity with serious comorbidity and body mass index (BMI) of 45.0  to 49.9 in adult [facility])      Meds ordered this encounter  Medications  . chlorthalidone (HYGROTON) 25 MG tablet    Sig: Take 1 tablet (25 mg total) by mouth daily.    Dispense:  90 tablet    Refill:  1    PT HAS LOST MEDICATION    Follow-up: No follow-ups on file.   Stressed the importance of ongoing weight loss for her knee and in general health. Libby Maw, MD

## 2020-04-20 DIAGNOSIS — R3915 Urgency of urination: Secondary | ICD-10-CM | POA: Diagnosis not present

## 2020-04-20 DIAGNOSIS — R350 Frequency of micturition: Secondary | ICD-10-CM | POA: Diagnosis not present

## 2020-04-27 DIAGNOSIS — R350 Frequency of micturition: Secondary | ICD-10-CM | POA: Diagnosis not present

## 2020-04-27 DIAGNOSIS — R3915 Urgency of urination: Secondary | ICD-10-CM | POA: Diagnosis not present

## 2020-05-02 ENCOUNTER — Ambulatory Visit (INDEPENDENT_AMBULATORY_CARE_PROVIDER_SITE_OTHER): Payer: Medicare HMO | Admitting: Family Medicine

## 2020-05-02 ENCOUNTER — Encounter (INDEPENDENT_AMBULATORY_CARE_PROVIDER_SITE_OTHER): Payer: Self-pay | Admitting: Family Medicine

## 2020-05-02 ENCOUNTER — Other Ambulatory Visit: Payer: Self-pay

## 2020-05-02 VITALS — BP 119/75 | HR 78 | Temp 97.9°F | Ht 62.0 in | Wt 255.0 lb

## 2020-05-02 DIAGNOSIS — E876 Hypokalemia: Secondary | ICD-10-CM | POA: Diagnosis not present

## 2020-05-02 DIAGNOSIS — Z6841 Body Mass Index (BMI) 40.0 and over, adult: Secondary | ICD-10-CM | POA: Diagnosis not present

## 2020-05-03 NOTE — Progress Notes (Signed)
Chief Complaint:   OBESITY Kristi Hunter is here to discuss her progress with her obesity treatment plan along with follow-up of her obesity related diagnoses. [name] is on the Category 2 Plan and states she is following her eating plan approximately 75% of the time. [name] states she is doing silver sneakers and walking for 20 minutes 3 times per week.  Today's visit was #: 5 Starting weight: 267 lbs Starting date: 02/22/2020 Today's weight: 255 lbs Today's date: 05/02/2020 Total lbs lost to date: 12 Total lbs lost since last in-office visit: 4  Interim History: [name] has done well with weight loss since her last visit. She has been caring for her daughter, and she hasn't been able to meal prep as well but trying to get back to it.  Subjective:   1. Hypokalemia Drema's K+ decreased to 3.4. She is on chlorthalidone and is being followed by her primary care physician. She denies muscle cramping or palpitations.  Assessment/Plan:   1. Hypokalemia We discussed higher K+ foods, and [name] is to follow up with her primary care physician for labs recheck.  2. Class 3 severe obesity with serious comorbidity and body mass index (BMI) of 45.0 to 49.9 in adult, unspecified obesity type [facility]) Talyn is currently in the action stage of change. As such, her goal is to continue with weight loss efforts. She has agreed to the Category 2 Plan or keeping a food journal and adhering to recommended goals of 1200-1300 calories and 85+ grams of protein daily.   Exercise goals: As is.  Behavioral modification strategies: increasing lean protein intake and no skipping meals.  [name] has agreed to follow-up with our clinic in 2 weeks with Dr. Raliegh Scarlet. She was informed of the importance of frequent follow-up visits to maximize her success with intensive lifestyle modifications for her multiple health conditions.   Objective:   Blood pressure 119/75, pulse 78, temperature 97.9 F  (36.6 C), height 5\' 2"  (1.575 m), weight 255 lb (115.7 kg), SpO2 100 %. Body mass index is 46.64 kg/m.  General: Cooperative, alert, well developed, in no acute distress. HEENT: Conjunctivae and lids unremarkable. Cardiovascular: Regular rhythm.  Lungs: Normal work of breathing. Neurologic: No focal deficits.   Lab Results  Component Value Date   CREATININE 0.65 04/18/2020   BUN 19 04/18/2020   NA 139 04/18/2020   K 3.4 (L) 04/18/2020   CL 99 04/18/2020   CO2 31 04/18/2020   Lab Results  Component Value Date   ALT 19 02/22/2020   AST 25 02/22/2020   ALKPHOS 85 02/22/2020   BILITOT 0.3 02/22/2020   Lab Results  Component Value Date   HGBA1C 5.9 (H) 02/22/2020   HGBA1C 5.9 05/10/2019   HGBA1C 6.0 01/13/2019   Lab Results  Component Value Date   INSULIN 21.5 02/22/2020   Lab Results  Component Value Date   TSH 1.470 02/22/2020   Lab Results  Component Value Date   CHOL 151 01/13/2019   HDL 44.90 01/13/2019   LDLCALC 92 01/13/2019   TRIG 72.0 01/13/2019   CHOLHDL 3 01/13/2019   Lab Results  Component Value Date   WBC 8.1 02/22/2020   HGB 13.2 02/22/2020   HCT 41.3 02/22/2020   MCV 86 02/22/2020   PLT 332 02/22/2020   No results found for: IRON, TIBC, FERRITIN  Attestation Statements:   Reviewed by clinician on day of visit: allergies, medications, problem list, medical history, surgical history, family history, social history, and previous encounter  notes.  Time spent on visit including pre-visit chart review and post-visit care and charting was 22 minutes.    I, Trixie Dredge, am acting as transcriptionist for [name], MD.  I have reviewed the above documentation for accuracy and completeness, and I agree with the above. -  [name], MD

## 2020-05-11 DIAGNOSIS — R3915 Urgency of urination: Secondary | ICD-10-CM | POA: Diagnosis not present

## 2020-05-11 DIAGNOSIS — R350 Frequency of micturition: Secondary | ICD-10-CM | POA: Diagnosis not present

## 2020-05-17 ENCOUNTER — Encounter: Payer: Self-pay | Admitting: Family Medicine

## 2020-05-17 ENCOUNTER — Ambulatory Visit (INDEPENDENT_AMBULATORY_CARE_PROVIDER_SITE_OTHER): Payer: Medicare HMO | Admitting: Family Medicine

## 2020-05-17 ENCOUNTER — Other Ambulatory Visit: Payer: Self-pay

## 2020-05-17 ENCOUNTER — Encounter (INDEPENDENT_AMBULATORY_CARE_PROVIDER_SITE_OTHER): Payer: Self-pay | Admitting: Family Medicine

## 2020-05-17 VITALS — BP 98/48 | HR 78 | Temp 98.0°F | Ht 62.0 in | Wt 253.0 lb

## 2020-05-17 VITALS — BP 133/67 | Ht 63.0 in | Wt 254.0 lb

## 2020-05-17 DIAGNOSIS — M1712 Unilateral primary osteoarthritis, left knee: Secondary | ICD-10-CM

## 2020-05-17 DIAGNOSIS — I10 Essential (primary) hypertension: Secondary | ICD-10-CM

## 2020-05-17 DIAGNOSIS — Z6841 Body Mass Index (BMI) 40.0 and over, adult: Secondary | ICD-10-CM

## 2020-05-17 NOTE — Progress Notes (Addendum)
   PCP: Libby Maw, MD  Subjective:   HPI: Patient is a 79 y.o. female here for evaluation of left knee pain.  She has known history of medial compartmental osteo-arthritis in her left knee, along with history of osteoarthritis in her right knee status post TKA.  She presents today to discuss viscosupplementation for her left knee.  Patient reports that she was seen by sports medicine a few months back, she received a steroid injection at that time and did not have very much benefit from it.  She reports that she has had multiple steroid injections in the past and is also not received very long benefit from them.  She continues to have mostly medial knee pain with walking which is progressively worsening, and would like to consider viscosupplementation.   Review of Systems:  Per HPI.   [place], medications and smoking status reviewed.      Objective:  Physical Exam:  No flowsheet data found.   Gen: awake, alert, NAD, comfortable in exam room Pulm: breathing unlabored  L Knee  Inspection: Bilateral knees without evidence of erythema, ecchymosis, swelling, edema. No effusion present  Active ROM: Mildly decreased, 5 to 140 degrees.   Strength: 5/5 strength to resisted flexion/extension without pain  Patella: Mild crepitus with extension.  No proximal or distal patellar tendon tenderness to palpation. No quad tendon tenderness to palpation.  Tibia: No tibial plateau, tibial tuberosity tenderness.  Joint line: She does have medial joint line tenderness Popliteal: No popliteal tenderness to the insertional gastroc. No insertional biceps femoris, semimembranosis, semitendinosis tenderness.  Thessaly's: Negative for pain, catching  Lachmans: Stable with firm endpoint  Varus/valgus stress at 0, 15d: Negative for pain, laxity    Assessment & Plan:  1.  Left knee medial compartmental osteoarthritis  Patient with OA of her left knee which is seen previously on x-ray.  Her exam  findings today are consistent with OA.  She has tried conservative therapies with Tylenol, NSAIDs, corticosteroid injection has not had adequate relief.  We will plan to trial viscosupplementation, we will contact her insurance for approval and have her back once approval is obtained for series of 3 injections.   Dagoberto Ligas, MD [facility] Fellow 05/17/2020 4:40 PM  Addendum:  Patient seen in the office by fellow.  His history, exam, plan of care were precepted with me.  Karlton Lemon MD Kirt Boys

## 2020-05-18 NOTE — Progress Notes (Signed)
Chief Complaint:   OBESITY Kristi Hunter is here to discuss her progress with her obesity treatment plan along with follow-up of her obesity related diagnoses. Kristi Hunter is on the Category 2 Plan and states she is following her eating plan approximately 60% of the time. Kristi Hunter states she is lifting weights and walking for 30 minutes 3 times per week.  Today's visit was #: 6 Starting weight: 267 lbs Starting date: 02/22/2020 Today's weight: 253 lbs Today's date: 05/17/2020 Total lbs lost to date: 14 lbs Total lbs lost since last in-office visit: 2 lbs   Interim History: Kristi Hunter says, "I didn't do as well as I should have".  She was seen by Dr. Leafy Ro prior.  Her granddaughter is getting married this month and there has been increased family stress.  Occasionally goes out for lunch with friends, etc.  Denies concerns with plan or questions about it.  Some motivational issues and trouble sticking on plan when doing "celebration eating".  We discussed strategies today to help.    Assessment/Plan:   1. Essential hypertension At goal. Medications: chlorthalidone 25 mg daily.   She checks her blood pressure at home and it runs 120s/80s.   Unusually low today per pt but she has no sx or concerns today.   Denies symptoms or concerns.  Plan:  Blood pressure is at goal but a little low.  Continue medications for now as pt states it is never that low at home and she feels fine.  Cont. prudent nutritional plan, & weight loss.  -  We will monitor for hypotension with continued weight loss- educated pt on those sx and she will closely monitor her Bp  BP Readings from Last 3 Encounters:  05/17/20 133/67  05/17/20 (!) 98/48  05/02/20 119/75   Lab Results  Component Value Date   CREATININE 0.65 04/18/2020     2. Class 3 severe obesity with serious comorbidity and body mass index (BMI) of 45.0 to 49.9 in adult, unspecified obesity type Kristi Hunter)  Kristi Hunter is currently in the action stage  of change. As such, her goal is to continue with weight loss efforts. She has agreed to the Category 2 Plan.   Exercise goals: For substantial health benefits, adults should do at least 150 minutes (2 hours and 30 minutes) a week of moderate-intensity, or 75 minutes (1 hour and 15 minutes) a week of vigorous-intensity aerobic physical activity, or an equivalent combination of moderate- and vigorous-intensity aerobic activity. Aerobic activity should be performed in episodes of at least 10 minutes, and preferably, it should be spread throughout the week.   Increase exercise to 30 minutes 5 days per week.  Behavioral modification strategies: increasing lean protein intake, meal planning and cooking strategies and planning for success.  Kristi Hunter has agreed to follow-up with our clinic in 2 weeks. She was informed of the importance of frequent follow-up visits to maximize her success with intensive lifestyle modifications for her multiple health conditions.    Objective:   Blood pressure (!) 98/48, pulse 78, temperature 98 F (36.7 C), height 5\' 2"  (1.575 m), weight 253 lb (114.8 kg), SpO2 96 %. Body mass index is 46.27 kg/m.  General: Cooperative, alert, well developed, in no acute distress. HEENT: Conjunctivae and lids unremarkable. Cardiovascular: Regular rhythm.  Lungs: Normal work of breathing. Neurologic: No focal deficits.   Lab Results  Component Value Date   CREATININE 0.65 04/18/2020   BUN 19 04/18/2020   NA 139 04/18/2020   K 3.4 (  L) 04/18/2020   CL 99 04/18/2020   CO2 31 04/18/2020   Lab Results  Component Value Date   ALT 19 02/22/2020   AST 25 02/22/2020   ALKPHOS 85 02/22/2020   BILITOT 0.3 02/22/2020   Lab Results  Component Value Date   HGBA1C 5.9 (H) 02/22/2020   HGBA1C 5.9 05/10/2019   HGBA1C 6.0 01/13/2019   Lab Results  Component Value Date   INSULIN 21.5 02/22/2020   Lab Results  Component Value Date   TSH 1.470 02/22/2020   Lab Results    Component Value Date   CHOL 151 01/13/2019   HDL 44.90 01/13/2019   LDLCALC 92 01/13/2019   TRIG 72.0 01/13/2019   CHOLHDL 3 01/13/2019   Lab Results  Component Value Date   WBC 8.1 02/22/2020   HGB 13.2 02/22/2020   HCT 41.3 02/22/2020   MCV 86 02/22/2020   PLT 332 02/22/2020   Obesity Behavioral Intervention:   Approximately 15 minutes were spent on the discussion below.  ASK: We discussed the diagnosis of obesity with Kristi Hunter today and Rashan agreed to give [name] permission to discuss obesity behavioral modification therapy today.  ASSESS: Jerrell has the diagnosis of obesity and her BMI today is 46.3. Kristi Hunter is in the action stage of change.   ADVISE: Kristi Hunter was educated on the multiple health risks of obesity as well as the benefit of weight loss to improve her health. She was advised of the need for long term treatment and the importance of lifestyle modifications to improve her current health and to decrease her risk of future health problems.  AGREE: Multiple dietary modification options and treatment options were discussed and Kristi Hunter agreed to follow the recommendations documented in the above note.  ARRANGE: Kristi Hunter was educated on the importance of frequent visits to treat obesity as outlined per CMS and USPSTF guidelines and agreed to schedule her next follow up appointment today.  Attestation Statements:   Reviewed by clinician on day of visit: allergies, medications, problem list, medical history, surgical history, family history, social history, and previous encounter notes.  I, Water quality scientist, CMA, am acting as Location manager for [employer], DO.  I have reviewed the above documentation for accuracy and completeness, and I agree with the above. Mellody Dance, DO

## 2020-05-22 ENCOUNTER — Ambulatory Visit (INDEPENDENT_AMBULATORY_CARE_PROVIDER_SITE_OTHER): Payer: Medicare HMO

## 2020-05-22 ENCOUNTER — Other Ambulatory Visit: Payer: Self-pay

## 2020-05-22 DIAGNOSIS — Z23 Encounter for immunization: Secondary | ICD-10-CM | POA: Diagnosis not present

## 2020-05-22 NOTE — Progress Notes (Signed)
Per orders of Dr [name], injection Influenza vaccine given by Armandina Gemma, cma.  Patient tolerated injection well.

## 2020-05-23 DIAGNOSIS — R3915 Urgency of urination: Secondary | ICD-10-CM | POA: Diagnosis not present

## 2020-05-23 DIAGNOSIS — M62838 Other muscle spasm: Secondary | ICD-10-CM | POA: Diagnosis not present

## 2020-05-23 DIAGNOSIS — M6289 Other specified disorders of muscle: Secondary | ICD-10-CM | POA: Diagnosis not present

## 2020-05-23 DIAGNOSIS — N3946 Mixed incontinence: Secondary | ICD-10-CM | POA: Diagnosis not present

## 2020-05-25 DIAGNOSIS — R3915 Urgency of urination: Secondary | ICD-10-CM | POA: Diagnosis not present

## 2020-05-25 DIAGNOSIS — R350 Frequency of micturition: Secondary | ICD-10-CM | POA: Diagnosis not present

## 2020-05-26 DIAGNOSIS — G4733 Obstructive sleep apnea (adult) (pediatric): Secondary | ICD-10-CM | POA: Diagnosis not present

## 2020-05-29 ENCOUNTER — Encounter (INDEPENDENT_AMBULATORY_CARE_PROVIDER_SITE_OTHER): Payer: Self-pay | Admitting: Family Medicine

## 2020-05-29 ENCOUNTER — Ambulatory Visit (INDEPENDENT_AMBULATORY_CARE_PROVIDER_SITE_OTHER): Payer: Medicare HMO | Admitting: Family Medicine

## 2020-05-29 ENCOUNTER — Other Ambulatory Visit: Payer: Self-pay

## 2020-05-29 VITALS — BP 141/60 | HR 72 | Temp 97.7°F | Ht 62.0 in | Wt 257.0 lb

## 2020-05-29 DIAGNOSIS — Z6841 Body Mass Index (BMI) 40.0 and over, adult: Secondary | ICD-10-CM

## 2020-05-29 DIAGNOSIS — G4733 Obstructive sleep apnea (adult) (pediatric): Secondary | ICD-10-CM

## 2020-05-29 DIAGNOSIS — R7303 Prediabetes: Secondary | ICD-10-CM

## 2020-05-30 ENCOUNTER — Ambulatory Visit (INDEPENDENT_AMBULATORY_CARE_PROVIDER_SITE_OTHER): Payer: Medicare HMO | Admitting: Family Medicine

## 2020-05-30 ENCOUNTER — Encounter: Payer: Self-pay | Admitting: Family Medicine

## 2020-05-30 ENCOUNTER — Telehealth (INDEPENDENT_AMBULATORY_CARE_PROVIDER_SITE_OTHER): Payer: Self-pay | Admitting: Family Medicine

## 2020-05-30 DIAGNOSIS — M1712 Unilateral primary osteoarthritis, left knee: Secondary | ICD-10-CM

## 2020-05-30 NOTE — Progress Notes (Signed)
   PCP: Libby Maw, MD  Subjective:   10/6: HPI: Patient is a 79 y.o. female here for evaluation of left knee pain.  She has known history of medial compartmental osteo-arthritis in her left knee, along with history of osteoarthritis in her right knee status post TKA.  She presents today to discuss viscosupplementation for her left knee.  Patient reports that she was seen by sports medicine a few months back, she received a steroid injection at that time and did not have very much benefit from it.  She reports that she has had multiple steroid injections in the past and is also not received very long benefit from them.  She continues to have mostly medial knee pain with walking which is progressively worsening, and would like to consider viscosupplementation.  10/19: Patient returns to start orthovisc series.  Review of Systems:  Per HPI.   [place], medications and smoking status reviewed.      Objective:  Physical Exam:  [place] Adult Exercise 05/30/2020  Frequency of aerobic exercise (# of days/week) 0  Average time in minutes 0  Frequency of strengthening activities (# of days/week) 2     Gen: NAD, comfortable in exam room  Rest of exam not repeated today.  L Knee  Inspection: Bilateral knees without evidence of erythema, ecchymosis, swelling, edema. No effusion present  Active ROM: Mildly decreased, 5 to 140 degrees.   Strength: 5/5 strength to resisted flexion/extension without pain  Patella: Mild crepitus with extension.  No proximal or distal patellar tendon tenderness to palpation. No quad tendon tenderness to palpation.  Tibia: No tibial plateau, tibial tuberosity tenderness.  Joint line: She does have medial joint line tenderness Popliteal: No popliteal tenderness to the insertional gastroc. No insertional biceps femoris, semimembranosis, semitendinosis tenderness.  Thessaly's: Negative for pain, catching  Lachmans: Stable with firm endpoint    Varus/valgus stress at 0, 15d: Negative for pain, laxity    Assessment & Plan:  1. Left knee osteoarthritis - started viscosupplementation series today.  F/u in 1 week for second injection.  After informed written consent timeout was performed, patient was seated in chair in exam room. Left knee was prepped with alcohol swab and utilizing anteromedial approach, patient's left knee was injected intraarticularly with 24mL bupivicaine followed by orthovisc. Patient tolerated the procedure well without immediate complications.

## 2020-05-30 NOTE — Telephone Encounter (Signed)
Pt went to pick up the prescription she was told she would rec'd at her appointment yesterday and it isn't at the pharmacy.  Please call patient when the prescription is sent over.

## 2020-05-31 ENCOUNTER — Encounter (INDEPENDENT_AMBULATORY_CARE_PROVIDER_SITE_OTHER): Payer: Self-pay | Admitting: Family Medicine

## 2020-05-31 NOTE — Telephone Encounter (Signed)
Pt called about the status of her metFORMIN refill. Please give pt a call.

## 2020-06-01 MED ORDER — METFORMIN HCL 500 MG PO TABS: 500.0000 mg | ORAL_TABLET | Freq: Every morning | ORAL | 0 refills | Status: DC

## 2020-06-01 NOTE — Telephone Encounter (Signed)
Please review

## 2020-06-05 ENCOUNTER — Ambulatory Visit: Payer: Medicare HMO | Admitting: Family Medicine

## 2020-06-05 ENCOUNTER — Encounter: Payer: Self-pay | Admitting: Family Medicine

## 2020-06-05 ENCOUNTER — Other Ambulatory Visit: Payer: Self-pay

## 2020-06-05 VITALS — BP 142/56 | Ht 63.0 in | Wt 256.0 lb

## 2020-06-05 DIAGNOSIS — M1712 Unilateral primary osteoarthritis, left knee: Secondary | ICD-10-CM | POA: Diagnosis not present

## 2020-06-05 NOTE — Progress Notes (Signed)
   PCP: Libby Maw, MD  Subjective:   10/6: HPI: Patient is a 79 y.o. female here for evaluation of left knee pain.  She has known history of medial compartmental osteo-arthritis in her left knee, along with history of osteoarthritis in her right knee status post TKA.  She presents today to discuss viscosupplementation for her left knee.  Patient reports that she was seen by sports medicine a few months back, she received a steroid injection at that time and did not have very much benefit from it.  She reports that she has had multiple steroid injections in the past and is also not received very long benefit from them.  She continues to have mostly medial knee pain with walking which is progressively worsening, and would like to consider viscosupplementation.  10/19: Patient returns to start orthovisc series.  10/25: Patient returns for second orthovisc injection. Reports some improvement since last visit.  Review of Systems:  Per HPI.   [place], medications and smoking status reviewed.      Objective:  Physical Exam:  [place] Adult Exercise 05/30/2020  Frequency of aerobic exercise (# of days/week) 0  Average time in minutes 0  Frequency of strengthening activities (# of days/week) 2     Gen: NAD, comfortable in exam room  Rest of exam not repeated today.  L Knee  Inspection: Bilateral knees without evidence of erythema, ecchymosis, swelling, edema. No effusion present  Active ROM: Mildly decreased, 5 to 140 degrees.   Strength: 5/5 strength to resisted flexion/extension without pain  Patella: Mild crepitus with extension.  No proximal or distal patellar tendon tenderness to palpation. No quad tendon tenderness to palpation.  Tibia: No tibial plateau, tibial tuberosity tenderness.  Joint line: She does have medial joint line tenderness Popliteal: No popliteal tenderness to the insertional gastroc. No insertional biceps femoris, semimembranosis,  semitendinosis tenderness.  Thessaly's: Negative for pain, catching  Lachmans: Stable with firm endpoint  Varus/valgus stress at 0, 15d: Negative for pain, laxity    Assessment & Plan:  1. Left knee osteoarthritis - second orthovisc injection given today.  F/u in 1 week for third injection.  After informed written consent timeout was performed, patient was seated in chair in exam room. Left knee was prepped with alcohol swab and utilizing anteromedial approach, patient's left knee was injected intraarticularly with 37mL bupivicaine followed by orthovisc. Patient tolerated the procedure well without immediate complications.

## 2020-06-06 NOTE — Progress Notes (Signed)
Chief Complaint:   OBESITY Kristi Hunter is here to discuss her progress with her obesity treatment plan along with follow-up of her obesity related diagnoses. Kristi Hunter is on the Category 2 Plan and states she is following her eating plan approximately 60% of the time. Kristi Hunter states she is doing 0 minutes 0 times per week.  Today's visit was #: 7 Starting weight: 267 lbs Starting date: 02/22/2020 Today's weight: 257 lbs Today's date: 05/29/2020 Total lbs lost to date: 10 Total lbs lost since last in-office visit: 0  Interim History: Kristi Hunter has struggled with increased eating out over the last 2 weeks. She is working on being mindful. She is struggling with increased hunger especially mid morning.  Subjective:   1. Pre-diabetes Kristi Hunter last A1c was elevated at 5.9, and she notes some increased polyphagia.  2. OSA (obstructive sleep apnea) Kristi Hunter finally got her CPAP and has used it 3 nights in a row, and she is doing well wearing it most of the night.  Assessment/Plan:   1. Pre-diabetes Kristi Hunter will continue to work on weight loss, exercise, and decreasing simple carbohydrates to help decrease the risk of diabetes. Kristi Hunter agreed to start metformin 500 mg q AM with no refills.  - metFORMIN (GLUCOPHAGE) 500 MG tablet; Take 1 tablet (500 mg total) by mouth in the morning.  Dispense: 30 tablet; Refill: 0  2. OSA (obstructive sleep apnea) Intensive lifestyle modifications are the first line treatment for this issue. We discussed several lifestyle modifications today. Kristi Hunter will continue to wear her CPAP, and will continue to work on diet, exercise and weight loss efforts. We will continue to monitor. Orders and follow up as documented in patient record.   3. Class 3 severe obesity with serious comorbidity and body mass index (BMI) of 45.0 to 49.9 in adult, unspecified obesity type [facility]) Kristi Hunter is currently in the action stage of change. As such, her goal is to  continue with weight loss efforts. She has agreed to the Category 2 Plan.   Behavioral modification strategies: increasing lean protein intake and dealing with family or coworker sabotage.  Kristi Hunter has agreed to follow-up with our clinic in 2 weeks. She was informed of the importance of frequent follow-up visits to maximize her success with intensive lifestyle modifications for her multiple health conditions.   Objective:   Blood pressure (!) 141/60, pulse 72, temperature 97.7 F (36.5 C), height 5\' 2"  (1.575 m), weight 257 lb (116.6 kg), SpO2 96 %. Body mass index is 47.01 kg/m.  General: Cooperative, alert, well developed, in no acute distress. HEENT: Conjunctivae and lids unremarkable. Cardiovascular: Regular rhythm.  Lungs: Normal work of breathing. Neurologic: No focal deficits.   Lab Results  Component Value Date   CREATININE 0.65 04/18/2020   BUN 19 04/18/2020   NA 139 04/18/2020   K 3.4 (L) 04/18/2020   CL 99 04/18/2020   CO2 31 04/18/2020   Lab Results  Component Value Date   ALT 19 02/22/2020   AST 25 02/22/2020   ALKPHOS 85 02/22/2020   BILITOT 0.3 02/22/2020   Lab Results  Component Value Date   HGBA1C 5.9 (H) 02/22/2020   HGBA1C 5.9 05/10/2019   HGBA1C 6.0 01/13/2019   Lab Results  Component Value Date   INSULIN 21.5 02/22/2020   Lab Results  Component Value Date   TSH 1.470 02/22/2020   Lab Results  Component Value Date   CHOL 151 01/13/2019   HDL 44.90 01/13/2019   LDLCALC 92 01/13/2019  TRIG 72.0 01/13/2019   CHOLHDL 3 01/13/2019   Lab Results  Component Value Date   WBC 8.1 02/22/2020   HGB 13.2 02/22/2020   HCT 41.3 02/22/2020   MCV 86 02/22/2020   PLT 332 02/22/2020   No results found for: IRON, TIBC, FERRITIN  Obesity Behavioral Intervention:   Approximately 15 minutes were spent on the discussion below.  ASK: We discussed the diagnosis of obesity with Kristi Hunter today and Kristi Hunter agreed to give [name] permission to discuss  obesity behavioral modification therapy today.  ASSESS: Kristi Hunter has the diagnosis of obesity and her BMI today is 46.99. Kristi Hunter is in the action stage of change.   ADVISE: Kristi Hunter was educated on the multiple health risks of obesity as well as the benefit of weight loss to improve her health. She was advised of the need for long term treatment and the importance of lifestyle modifications to improve her current health and to decrease her risk of future health problems.  AGREE: Multiple dietary modification options and treatment options were discussed and Kristi Hunter agreed to follow the recommendations documented in the above note.  ARRANGE: Kristi Hunter was educated on the importance of frequent visits to treat obesity as outlined per CMS and USPSTF guidelines and agreed to schedule her next follow up appointment today.  Attestation Statements:   Reviewed by clinician on day of visit: allergies, medications, problem list, medical history, surgical history, family history, social history, and previous encounter notes.   I, Trixie Dredge, am acting as transcriptionist for [name], MD.  I have reviewed the above documentation for accuracy and completeness, and I agree with the above. -  [name], MD

## 2020-06-08 DIAGNOSIS — R3915 Urgency of urination: Secondary | ICD-10-CM | POA: Diagnosis not present

## 2020-06-08 DIAGNOSIS — R350 Frequency of micturition: Secondary | ICD-10-CM | POA: Diagnosis not present

## 2020-06-12 ENCOUNTER — Encounter (INDEPENDENT_AMBULATORY_CARE_PROVIDER_SITE_OTHER): Payer: Self-pay | Admitting: Family Medicine

## 2020-06-12 ENCOUNTER — Ambulatory Visit (INDEPENDENT_AMBULATORY_CARE_PROVIDER_SITE_OTHER): Payer: Medicare HMO | Admitting: Family Medicine

## 2020-06-12 ENCOUNTER — Other Ambulatory Visit: Payer: Self-pay

## 2020-06-12 VITALS — BP 127/79 | HR 66 | Temp 97.5°F | Ht 63.0 in | Wt 256.0 lb

## 2020-06-12 DIAGNOSIS — R7303 Prediabetes: Secondary | ICD-10-CM | POA: Diagnosis not present

## 2020-06-12 DIAGNOSIS — Z6841 Body Mass Index (BMI) 40.0 and over, adult: Secondary | ICD-10-CM | POA: Diagnosis not present

## 2020-06-12 MED ORDER — METFORMIN HCL 500 MG PO TABS: 500.0000 mg | ORAL_TABLET | Freq: Every morning | ORAL | 0 refills | Status: DC

## 2020-06-13 ENCOUNTER — Ambulatory Visit: Payer: Medicare HMO | Admitting: Family Medicine

## 2020-06-13 ENCOUNTER — Encounter: Payer: Self-pay | Admitting: Family Medicine

## 2020-06-13 VITALS — BP 148/64 | Ht 63.0 in | Wt 256.0 lb

## 2020-06-13 DIAGNOSIS — M1712 Unilateral primary osteoarthritis, left knee: Secondary | ICD-10-CM | POA: Diagnosis not present

## 2020-06-13 NOTE — Progress Notes (Signed)
Chief Complaint:   OBESITY Teighlor is here to discuss her progress with her obesity treatment plan along with follow-up of her obesity related diagnoses. Teale is on the Category 2 Plan and states she is following her eating plan approximately 20% of the time. Emmalynne states she is doing 0 minutes 0 times per week.  Today's visit was #: 8 Starting weight: 267 lbs Starting date: 02/22/2020 Today's weight: 256 lbs Today's date: 06/12/2020 Total lbs lost to date: 11 Total lbs lost since last in-office visit: 1  Interim History: Raesha continues to do well with weight loss on her Category 2 plan. Her hunger is stable, but she had company and celebration eating. She was mindful with her food choices but did have extra temptations.  Subjective:   1. Pre-diabetes Sherran had trouble for a few days getting her prescription filled. She noted some dizziness when she first started the metformin, so she cut it in half and is tolerating it well. She is ready to increase her dose to a full pill.  Assessment/Plan:   1. Pre-diabetes Starla will continue to work on weight loss, diet, exercise, and decreasing simple carbohydrates to help decrease the risk of diabetes. We will refill metformin for 1 month.  - metFORMIN (GLUCOPHAGE) 500 MG tablet; Take 1 tablet (500 mg total) by mouth in the morning.  Dispense: 30 tablet; Refill: 0  2. Class 3 severe obesity with serious comorbidity and body mass index (BMI) of 45.0 to 49.9 in adult, unspecified obesity type [facility]) Kacey is currently in the action stage of change. As such, her goal is to continue with weight loss efforts. She has agreed to the Category 2 Plan.   Exercise goals: All adults should avoid inactivity. Some physical activity is better than none, and adults who participate in any amount of physical activity gain some health benefits.  Behavioral modification strategies: holiday eating strategies  and celebration eating  strategies.  Disney has agreed to follow-up with our clinic in 2 to 3 weeks. She was informed of the importance of frequent follow-up visits to maximize her success with intensive lifestyle modifications for her multiple health conditions.   Objective:   Blood pressure 127/79, pulse 66, temperature (!) 97.5 F (36.4 C), height 5\' 3"  (1.6 m), weight 256 lb (116.1 kg), SpO2 96 %. Body mass index is 45.35 kg/m.  General: Cooperative, alert, well developed, in no acute distress. HEENT: Conjunctivae and lids unremarkable. Cardiovascular: Regular rhythm.  Lungs: Normal work of breathing. Neurologic: No focal deficits.   Lab Results  Component Value Date   CREATININE 0.65 04/18/2020   BUN 19 04/18/2020   NA 139 04/18/2020   K 3.4 (L) 04/18/2020   CL 99 04/18/2020   CO2 31 04/18/2020   Lab Results  Component Value Date   ALT 19 02/22/2020   AST 25 02/22/2020   ALKPHOS 85 02/22/2020   BILITOT 0.3 02/22/2020   Lab Results  Component Value Date   HGBA1C 5.9 (H) 02/22/2020   HGBA1C 5.9 05/10/2019   HGBA1C 6.0 01/13/2019   Lab Results  Component Value Date   INSULIN 21.5 02/22/2020   Lab Results  Component Value Date   TSH 1.470 02/22/2020   Lab Results  Component Value Date   CHOL 151 01/13/2019   HDL 44.90 01/13/2019   LDLCALC 92 01/13/2019   TRIG 72.0 01/13/2019   CHOLHDL 3 01/13/2019   Lab Results  Component Value Date   WBC 8.1 02/22/2020   HGB  13.2 02/22/2020   HCT 41.3 02/22/2020   MCV 86 02/22/2020   PLT 332 02/22/2020   No results found for: IRON, TIBC, FERRITIN  Obesity Behavioral Intervention:   Approximately 15 minutes were spent on the discussion below.  ASK: We discussed the diagnosis of obesity with [name] today and Zamira agreed to give [name] permission to discuss obesity behavioral modification therapy today.  ASSESS: Saloma has the diagnosis of obesity and her BMI today is 45.36. Karron is in the action stage of change.    ADVISE: Goldia was educated on the multiple health risks of obesity as well as the benefit of weight loss to improve her health. She was advised of the need for long term treatment and the importance of lifestyle modifications to improve her current health and to decrease her risk of future health problems.  AGREE: Multiple dietary modification options and treatment options were discussed and Aleane agreed to follow the recommendations documented in the above note.  ARRANGE: Cobi was educated on the importance of frequent visits to treat obesity as outlined per CMS and USPSTF guidelines and agreed to schedule her next follow up appointment today.  Attestation Statements:   Reviewed by clinician on day of visit: allergies, medications, problem list, medical history, surgical history, family history, social history, and previous encounter notes.   I, Trixie Dredge, am acting as transcriptionist for [name], MD.  I have reviewed the above documentation for accuracy and completeness, and I agree with the above. -  [name], MD

## 2020-06-13 NOTE — Progress Notes (Signed)
   PCP: Libby Maw, MD  Subjective:   10/6: HPI: Patient is a 79 y.o. female here for evaluation of left knee pain.  She has known history of medial compartmental osteo-arthritis in her left knee, along with history of osteoarthritis in her right knee status post TKA.  She presents today to discuss viscosupplementation for her left knee.  Patient reports that she was seen by sports medicine a few months back, she received a steroid injection at that time and did not have very much benefit from it.  She reports that she has had multiple steroid injections in the past and is also not received very long benefit from them.  She continues to have mostly medial knee pain with walking which is progressively worsening, and would like to consider viscosupplementation.  10/19: Patient returns to start orthovisc series.  10/25: Patient returns for second orthovisc injection. Reports some improvement since last visit.  11/2: Patient returns for third orthovisc injection. Patient is doing well - still noting improvement.  Review of Systems:  Per HPI.   Gray, medications and smoking status reviewed.      Objective:  Physical Exam:  [place] Adult Exercise 05/30/2020  Frequency of aerobic exercise (# of days/week) 0  Average time in minutes 0  Frequency of strengthening activities (# of days/week) 2     Gen: NAD, comfortable in exam room  Rest of exam not repeated today.  L Knee  Inspection: Bilateral knees without evidence of erythema, ecchymosis, swelling, edema. No effusion present  Active ROM: Mildly decreased, 5 to 140 degrees.   Strength: 5/5 strength to resisted flexion/extension without pain  Patella: Mild crepitus with extension.  No proximal or distal patellar tendon tenderness to palpation. No quad tendon tenderness to palpation.  Tibia: No tibial plateau, tibial tuberosity tenderness.  Joint line: She does have medial joint line  tenderness Popliteal: No popliteal tenderness to the insertional gastroc. No insertional biceps femoris, semimembranosis, semitendinosis tenderness.  Thessaly's: Negative for pain, catching  Lachmans: Stable with firm endpoint  Varus/valgus stress at 0, 15d: Negative for pain, laxity    Assessment & Plan:  1. Left knee osteoarthritis - third orthovisc injection given today.  Will save 4th injection.  F/u prn.  After informed written consent timeout was performed, patient was seated in chair in exam table. Left knee was prepped with alcohol swab and utilizing anteromedial approach, patient's left knee was injected intraarticularly with 72mL bupivicaine followed by orthovisc. Patient tolerated the procedure well without immediate complications.

## 2020-06-20 ENCOUNTER — Ambulatory Visit: Payer: Medicare HMO | Admitting: Family Medicine

## 2020-06-23 ENCOUNTER — Other Ambulatory Visit (INDEPENDENT_AMBULATORY_CARE_PROVIDER_SITE_OTHER): Payer: Self-pay | Admitting: Family Medicine

## 2020-06-23 DIAGNOSIS — R7303 Prediabetes: Secondary | ICD-10-CM

## 2020-06-26 DIAGNOSIS — G4733 Obstructive sleep apnea (adult) (pediatric): Secondary | ICD-10-CM | POA: Diagnosis not present

## 2020-06-27 DIAGNOSIS — D3131 Benign neoplasm of right choroid: Secondary | ICD-10-CM | POA: Diagnosis not present

## 2020-06-27 DIAGNOSIS — H401131 Primary open-angle glaucoma, bilateral, mild stage: Secondary | ICD-10-CM | POA: Diagnosis not present

## 2020-06-27 DIAGNOSIS — Z9889 Other specified postprocedural states: Secondary | ICD-10-CM | POA: Diagnosis not present

## 2020-06-27 DIAGNOSIS — Z961 Presence of intraocular lens: Secondary | ICD-10-CM | POA: Diagnosis not present

## 2020-06-27 DIAGNOSIS — H04123 Dry eye syndrome of bilateral lacrimal glands: Secondary | ICD-10-CM | POA: Diagnosis not present

## 2020-06-28 ENCOUNTER — Other Ambulatory Visit: Payer: Self-pay

## 2020-06-28 ENCOUNTER — Encounter (INDEPENDENT_AMBULATORY_CARE_PROVIDER_SITE_OTHER): Payer: Self-pay | Admitting: Family Medicine

## 2020-06-28 ENCOUNTER — Ambulatory Visit (INDEPENDENT_AMBULATORY_CARE_PROVIDER_SITE_OTHER): Payer: Medicare HMO | Admitting: Family Medicine

## 2020-06-28 VITALS — BP 136/77 | HR 81 | Temp 97.5°F | Ht 63.0 in | Wt 253.0 lb

## 2020-06-28 DIAGNOSIS — F430 Acute stress reaction: Secondary | ICD-10-CM

## 2020-06-28 DIAGNOSIS — M6289 Other specified disorders of muscle: Secondary | ICD-10-CM | POA: Diagnosis not present

## 2020-06-28 DIAGNOSIS — Z6841 Body Mass Index (BMI) 40.0 and over, adult: Secondary | ICD-10-CM | POA: Diagnosis not present

## 2020-06-28 DIAGNOSIS — R7303 Prediabetes: Secondary | ICD-10-CM | POA: Diagnosis not present

## 2020-06-28 DIAGNOSIS — R351 Nocturia: Secondary | ICD-10-CM | POA: Diagnosis not present

## 2020-06-28 DIAGNOSIS — R3915 Urgency of urination: Secondary | ICD-10-CM | POA: Diagnosis not present

## 2020-06-28 DIAGNOSIS — N3946 Mixed incontinence: Secondary | ICD-10-CM | POA: Diagnosis not present

## 2020-06-28 DIAGNOSIS — R69 Illness, unspecified: Secondary | ICD-10-CM | POA: Diagnosis not present

## 2020-06-28 DIAGNOSIS — M62838 Other muscle spasm: Secondary | ICD-10-CM | POA: Diagnosis not present

## 2020-06-28 DIAGNOSIS — M6281 Muscle weakness (generalized): Secondary | ICD-10-CM | POA: Diagnosis not present

## 2020-06-28 MED ORDER — METFORMIN HCL 500 MG PO TABS: 500.0000 mg | ORAL_TABLET | Freq: Every morning | ORAL | 0 refills | Status: DC

## 2020-06-29 DIAGNOSIS — R350 Frequency of micturition: Secondary | ICD-10-CM | POA: Diagnosis not present

## 2020-06-29 DIAGNOSIS — R3915 Urgency of urination: Secondary | ICD-10-CM | POA: Diagnosis not present

## 2020-06-29 NOTE — Progress Notes (Signed)
Chief Complaint:   OBESITY Kristi Hunter is here to discuss her progress with her obesity treatment plan along with follow-up of her obesity related diagnoses. Kristi Hunter is on the Category 2 Plan and states she is following her eating plan approximately 45% of the time. Kristi Hunter states she is walking 5,000 steps per day 5 times per week.  Today's visit was #: 9 Starting weight: 267 lbs Starting date: 02/22/2020 Today's weight: 253 lbs Today's date: 06/28/2020 Total lbs lost to date: 14 Total lbs lost since last in-office visit: 3  Interim History: Kristi Hunter continues to do well with weight loss despite celebrations, and increased temptations. She is dealing with increased family stress however, which could increase emotional eating if she isn't careful.  Subjective:   1. Stress reaction Kristi Hunter has a lot of stress especially with family drama. She has a history of depression and she has done well in counseling previously.  2. Pre-diabetes Kristi Hunter's last A1c was elevated at 5.9 but has decreased from 6.0 last year. She is taking 1/2 metformin every day and sometimes takes a second 1/2 pill.  Assessment/Plan:   1. Stress reaction We will refer to [facility] for counseling. Kristi Hunter will continue to follow up as directed.  - Ambulatory referral to Psychology  2. Pre-diabetes Kristi Hunter will continue to work on weight loss, diet, exercise, and decreasing simple carbohydrates to help decrease the risk of diabetes. We will refill metformin for 1 month.  - metFORMIN (GLUCOPHAGE) 500 MG tablet; Take 1 tablet (500 mg total) by mouth in the morning.  Dispense: 30 tablet; Refill: 0  3. Class 3 severe obesity with serious comorbidity and body mass index (BMI) of 40.0 to 44.9 in adult, unspecified obesity type Kristi Hunter) Kristi Hunter is currently in the action stage of change. As such, her goal is to continue with weight loss efforts. She has agreed to the Category 2 Plan.    Exercise goals: As is.  Behavioral modification strategies: meal planning and cooking strategies and emotional eating strategies.  Kristi Hunter has agreed to follow-up with our clinic in 3 weeks. She was informed of the importance of frequent follow-up visits to maximize her success with intensive lifestyle modifications for her multiple health conditions.   Objective:   Blood pressure 136/77, pulse 81, temperature (!) 97.5 F (36.4 C), height 5\' 3"  (1.6 m), weight 253 lb (114.8 kg), SpO2 96 %. Body mass index is 44.82 kg/m.  General: Cooperative, alert, well developed, in no acute distress. HEENT: Conjunctivae and lids unremarkable. Cardiovascular: Regular rhythm.  Lungs: Normal work of breathing. Neurologic: No focal deficits.   Lab Results  Component Value Date   CREATININE 0.65 04/18/2020   BUN 19 04/18/2020   NA 139 04/18/2020   K 3.4 (L) 04/18/2020   CL 99 04/18/2020   CO2 31 04/18/2020   Lab Results  Component Value Date   ALT 19 02/22/2020   AST 25 02/22/2020   ALKPHOS 85 02/22/2020   BILITOT 0.3 02/22/2020   Lab Results  Component Value Date   HGBA1C 5.9 (H) 02/22/2020   HGBA1C 5.9 05/10/2019   HGBA1C 6.0 01/13/2019   Lab Results  Component Value Date   INSULIN 21.5 02/22/2020   Lab Results  Component Value Date   TSH 1.470 02/22/2020   Lab Results  Component Value Date   CHOL 151 01/13/2019   HDL 44.90 01/13/2019   LDLCALC 92 01/13/2019   TRIG 72.0 01/13/2019   CHOLHDL 3 01/13/2019   Lab Results  Component Value Date   WBC 8.1 02/22/2020   HGB 13.2 02/22/2020   HCT 41.3 02/22/2020   MCV 86 02/22/2020   PLT 332 02/22/2020   No results found for: IRON, TIBC, FERRITIN  Obesity Behavioral Intervention:   Approximately 15 minutes were spent on the discussion below.  ASK: We discussed the diagnosis of obesity with Kristi Hunter today and Kristi Hunter agreed to give [name] permission to discuss obesity behavioral modification therapy  today.  ASSESS: Kristi Hunter has the diagnosis of obesity and her BMI today is 44.83. Kristi Hunter is in the action stage of change.   ADVISE: Kristi Hunter was educated on the multiple health risks of obesity as well as the benefit of weight loss to improve her health. She was advised of the need for long term treatment and the importance of lifestyle modifications to improve her current health and to decrease her risk of future health problems.  AGREE: Multiple dietary modification options and treatment options were discussed and Kristi Hunter agreed to follow the recommendations documented in the above note.  ARRANGE: Kristi Hunter was educated on the importance of frequent visits to treat obesity as outlined per CMS and USPSTF guidelines and agreed to schedule her next follow up appointment today.  Attestation Statements:   Reviewed by clinician on day of visit: allergies, medications, problem list, medical history, surgical history, family history, social history, and previous encounter notes.   I, Trixie Dredge, am acting as transcriptionist for [name], MD.  I have reviewed the above documentation for accuracy and completeness, and I agree with the above. -  [name], MD

## 2020-07-18 ENCOUNTER — Other Ambulatory Visit: Payer: Self-pay

## 2020-07-18 ENCOUNTER — Encounter (INDEPENDENT_AMBULATORY_CARE_PROVIDER_SITE_OTHER): Payer: Self-pay | Admitting: Family Medicine

## 2020-07-18 ENCOUNTER — Ambulatory Visit (INDEPENDENT_AMBULATORY_CARE_PROVIDER_SITE_OTHER): Payer: Medicare HMO | Admitting: Family Medicine

## 2020-07-18 VITALS — BP 118/72 | HR 79 | Temp 97.5°F | Ht 63.0 in | Wt 252.0 lb

## 2020-07-18 DIAGNOSIS — R7303 Prediabetes: Secondary | ICD-10-CM | POA: Diagnosis not present

## 2020-07-18 DIAGNOSIS — Z6841 Body Mass Index (BMI) 40.0 and over, adult: Secondary | ICD-10-CM

## 2020-07-18 MED ORDER — METFORMIN HCL 500 MG PO TABS: 500.0000 mg | ORAL_TABLET | Freq: Every morning | ORAL | 0 refills | Status: DC

## 2020-07-19 ENCOUNTER — Telehealth: Payer: Self-pay | Admitting: Family Medicine

## 2020-07-19 NOTE — Progress Notes (Signed)
Chief Complaint:   OBESITY Kristi Hunter is here to discuss her progress with her obesity treatment plan along with follow-up of her obesity related diagnoses. Mackenzey is on the Category 2 Plan and states she is following her eating plan approximately 60% of the time. Rockell states she is walking for 20-30 minutes 2 times per week.  Today's visit was #: 10 Starting weight: 267 lbs Starting date: 02/22/2020 Today's weight: 252 lbs Today's date: 07/18/2020 Total lbs lost to date: 15 Total lbs lost since last in-office visit: 1  Interim History: [name] has done well with weight loss even over Thanksgiving. She has increased walking since her knee pain has improved. She is doing well increasing lean protein and trying to portion control and make smarter choices.  Subjective:   1. Pre-diabetes [name] is stable on metformin. She is working on decreasing simple carbohydrates, but she still struggles at times.  Assessment/Plan:   1. Pre-diabetes Tyler will continue to work on weight loss, exercise, and decreasing simple carbohydrates to help decrease the risk of diabetes. We will refill metformin for 90 days with no refills.  - metFORMIN (GLUCOPHAGE) 500 MG tablet; Take 1 tablet (500 mg total) by mouth in the morning.  Dispense: 90 tablet; Refill: 0  2. Class 3 severe obesity with serious comorbidity and body mass index (BMI) of 40.0 to 44.9 in adult, unspecified obesity type [facility]) [name] is currently in the action stage of change. As such, her goal is to continue with weight loss efforts. She has agreed to the Category 2 Plan.   Exercise goals: As is.  Behavioral modification strategies: increasing lean protein intake, meal planning and cooking strategies, travel eating strategies and holiday eating strategies .  [name] has agreed to follow-up with our clinic in 6 weeks. She was informed of the importance of frequent follow-up visits to maximize her success with  intensive lifestyle modifications for her multiple health conditions.   Objective:   Blood pressure 118/72, pulse 79, temperature (!) 97.5 F (36.4 C), height 5\' 3"  (1.6 m), weight 252 lb (114.3 kg), SpO2 94 %. Body mass index is 44.64 kg/m.  General: Cooperative, alert, well developed, in no acute distress. HEENT: Conjunctivae and lids unremarkable. Cardiovascular: Regular rhythm.  Lungs: Normal work of breathing. Neurologic: No focal deficits.   Lab Results  Component Value Date   CREATININE 0.65 04/18/2020   BUN 19 04/18/2020   NA 139 04/18/2020   K 3.4 (L) 04/18/2020   CL 99 04/18/2020   CO2 31 04/18/2020   Lab Results  Component Value Date   ALT 19 02/22/2020   AST 25 02/22/2020   ALKPHOS 85 02/22/2020   BILITOT 0.3 02/22/2020   Lab Results  Component Value Date   HGBA1C 5.9 (H) 02/22/2020   HGBA1C 5.9 05/10/2019   HGBA1C 6.0 01/13/2019   Lab Results  Component Value Date   INSULIN 21.5 02/22/2020   Lab Results  Component Value Date   TSH 1.470 02/22/2020   Lab Results  Component Value Date   CHOL 151 01/13/2019   HDL 44.90 01/13/2019   LDLCALC 92 01/13/2019   TRIG 72.0 01/13/2019   CHOLHDL 3 01/13/2019   Lab Results  Component Value Date   WBC 8.1 02/22/2020   HGB 13.2 02/22/2020   HCT 41.3 02/22/2020   MCV 86 02/22/2020   PLT 332 02/22/2020   No results found for: IRON, TIBC, FERRITIN  Attestation Statements:   Reviewed by clinician on day of visit: allergies,  medications, problem list, medical history, surgical history, family history, social history, and previous encounter notes.  Time spent on visit including pre-visit chart review and post-visit care and charting was 32 minutes.    I, Trixie Dredge, am acting as transcriptionist for [name], MD.  I have reviewed the above documentation for accuracy and completeness, and I agree with the above. -  [name], MD

## 2020-07-19 NOTE — Progress Notes (Signed)
  Chronic Care Management   Note  07/19/2020 Name: Kristi Hunter MRN: [id number] DOB: 06/28/1941  Kristi Hunter is a 79 y.o. year old female who is a primary care patient of Libby Maw, MD. I reached out to Kandice Hams by phone today in response to a referral sent by [name] PCP, Libby Maw, MD.   [name] was given information about Chronic Care Management services today including:  1. CCM service includes personalized support from designated clinical staff supervised by her physician, including individualized plan of care and coordination with other care providers 2. 24/7 contact phone numbers for assistance for urgent and routine care needs. 3. Service will only be billed when office clinical staff spend 20 minutes or more in a month to coordinate care. 4. Only one practitioner may furnish and bill the service in a calendar month. 5. The patient may stop CCM services at any time (effective at the end of the month) by phone call to the office staff.   Patient agreed to services and verbal consent obtained.   Follow up plan:   Carley Perdue [employer] Scheduler

## 2020-07-20 DIAGNOSIS — R3915 Urgency of urination: Secondary | ICD-10-CM | POA: Diagnosis not present

## 2020-07-22 ENCOUNTER — Other Ambulatory Visit (INDEPENDENT_AMBULATORY_CARE_PROVIDER_SITE_OTHER): Payer: Self-pay | Admitting: Family Medicine

## 2020-07-22 DIAGNOSIS — R7303 Prediabetes: Secondary | ICD-10-CM

## 2020-07-26 ENCOUNTER — Ambulatory Visit: Payer: Medicare HMO

## 2020-07-26 DIAGNOSIS — M810 Age-related osteoporosis without current pathological fracture: Secondary | ICD-10-CM

## 2020-07-26 DIAGNOSIS — G4733 Obstructive sleep apnea (adult) (pediatric): Secondary | ICD-10-CM | POA: Diagnosis not present

## 2020-07-26 DIAGNOSIS — I10 Essential (primary) hypertension: Secondary | ICD-10-CM

## 2020-07-26 NOTE — Chronic Care Management (AMB) (Signed)
Chronic Care Management Pharmacy  Name: Kristi Hunter  MRN: [id number] DOB: 30-Jun-1941   Chief Complaint/ HPI  Kristi Hunter,  79 y.o. , female presents for her Initial CCM visit with the clinical pharmacist via telephone.  PCP : Libby Maw, MD Patient Care Team: Libby Maw, MD as PCP - General (Family Medicine) Germaine Pomfret, [facility] as Pharmacist (Pharmacist)  Patient's chronic conditions include: Hypertension, Hyperlipidemia, Depression, Anxiety, Osteoporosis, Osteoarthritis, Allergic Rhinitis and Prediabetes   Office Visits: 04/18/20: Patient presented to Dr. [name] for follow-up. No medication changes made.    Consult Visit: 07/18/20: Patient presented to Dr. Leafy Ro [facility]) for Prediabetes follow-up. Patient has lost 15 lbs since starting program in July 2021. 06/28/20: Patient presented to Dr. Leafy Ro [facility]) for Prediabetes follow-up 06/13/20: Patient presented to Dr. Ned Clines (Sports Medicine) for osteoarthritis. Patient received third orthovisc injection in left knee.  06/12/20: Patient presented to Dr. Leafy Ro [facility] Management) for Prediabetes follow-up  Objective: Allergies  Allergen Reactions  . Percocet [Oxycodone-Acetaminophen]     Medications: Outpatient Encounter Medications as of 07/26/2020  Medication Sig  . aspirin 81 MG EC tablet Take 81 mg by mouth daily. Swallow whole.  . calcium-vitamin D (OSCAL WITH D) 500-200 MG-UNIT tablet Take 1 tablet by mouth 2 (two) times daily.  . chlorthalidone (HYGROTON) 25 MG tablet Take 1 tablet (25 mg total) by mouth daily.  Marland Kitchen GLUCOSAMINE-CHONDROITIN-VIT D3 PO Take 2,000 Units by mouth daily.  . Lactobacillus (PROBIOTIC ACIDOPHILUS PO) Take 1 capsule by mouth daily.  . Melatonin 10 MG TABS Take 10 mg by mouth at bedtime.  . metFORMIN (GLUCOPHAGE) 500 MG tablet Take 1 tablet (500 mg total) by mouth in the morning.  . Multiple Vitamin (MULTIVITAMIN) tablet Take 1 tablet by mouth  daily.  . naproxen (NAPROSYN) 500 MG tablet Take one twice daily as needed for pain with food.  Marland Kitchen omega-3 fish oil (MAXEPA) 1000 MG CAPS capsule Take 1 capsule by mouth daily.   . Turmeric 500 MG CAPS Take 1 capsule by mouth daily.  . Magnesium 500 MG CAPS Take 1 capsule by mouth daily. (Patient not taking: Reported on 07/26/2020)  . PROLIA 60 MG/ML SOSY injection   . traZODone (DESYREL) 50 MG tablet  (Patient not taking: Reported on 07/26/2020)  . [DISCONTINUED] Cholecalciferol (VITAMIN D3 PO) Take 1,000 Units by mouth daily.   . [DISCONTINUED] naproxen sodium (ALEVE) 220 MG tablet Take 220 mg by mouth daily as needed.   No facility-administered encounter medications on file as of 07/26/2020.    Wt Readings from Last 3 Encounters:  07/18/20 252 lb (114.3 kg)  06/28/20 253 lb (114.8 kg)  06/13/20 256 lb (116.1 kg)    Lab Results  Component Value Date   CREATININE 0.65 04/18/2020   BUN 19 04/18/2020   GFR 88.45 04/18/2020   GFRNONAA 90 02/22/2020   GFRAA 104 02/22/2020   NA 139 04/18/2020   K 3.4 (L) 04/18/2020   CALCIUM 9.3 04/18/2020   CO2 31 04/18/2020   Current Diagnosis/Assessment:  SDOH Interventions   Flowsheet Row Most Recent Value  SDOH Interventions   Financial Strain Interventions Intervention Not Indicated  Transportation Interventions Intervention Not Indicated      Goals Addressed            This Visit's Progress   . Chronic Care Management       CARE PLAN ENTRY (see longitudinal plan of care for additional care plan information)  Current Barriers:  . Chronic Disease  Management support, education, and care coordination needs related to Hypertension, Hyperlipidemia, Depression, Anxiety, Osteoporosis, Osteoarthritis, Allergic Rhinitis and Prediabetes   Hypertension BP Readings from Last 3 Encounters:  07/18/20 118/72  06/28/20 136/77  06/13/20 (!) 148/64   . Pharmacist Clinical Goal(s): o Over the next 90 days, patient will work with PharmD and  providers to maintain BP goal <140/90 . Current regimen:  o Chlorthalidone 25 mg daily  . Interventions: o Discussed low salt diet and exercising as tolerated extensively o Will initiate blood pressure monitoring plan  . Patient self care activities - Over the next 90 days, patient will: o Check Blood Pressure Weekly, document, and provide at future appointments o Ensure daily salt intake < 2300 mg/day  Hyperlipidemia Lab Results  Component Value Date/Time   LDLCALC 92 01/13/2019 09:13 AM   . Pharmacist Clinical Goal(s): o Over the next 90 days, patient will work with PharmD and providers to maintain LDL goal < 10 . Current regimen:  o None . Interventions: o Discussed low cholesterol diet and exercising as tolerated extensively o Will initiate cholesterol monitoring plan   Prediabetes Lab Results  Component Value Date/Time   HGBA1C 5.9 (H) 02/22/2020 01:39 PM   HGBA1C 5.9 05/10/2019 10:18 AM   . Pharmacist Clinical Goal(s): o Over the next 90 days, patient will work with PharmD and providers to maintain A1c goal <6.5% . Current regimen:  o Metformin 500 mg daily  . Interventions: o Discussed carbohydrate counting and exercising as tolerated extensively o Will initiate blood sugar monitoring plan  . Patient self care activities - Over the next 90 days, patient will: o Contact provider with any episodes of hypoglycemia  Medication management . Pharmacist Clinical Goal(s): o Over the next 90 days, patient will work with PharmD and providers to maintain optimal medication adherence . [facility] . Interventions o Comprehensive medication review performed. o Continue current medication management strategy . Patient self care activities - Over the next 90 days, patient will: o Take medications as prescribed o Report any questions or concerns to PharmD and/or provider(s)      Hypertension   BP goal is:  <140/90  Office blood pressures are  BP Readings from  Last 3 Encounters:  07/18/20 118/72  06/28/20 136/77  06/13/20 (!) 148/64   Patient checks BP at home when feeling symptomatic Patient home BP readings are ranging: NA  Patient has failed these meds in the past: HCTZ  Patient is currently controlled on the following medications:  . Chlorthalidone 25 mg daily   We discussed diet and exercise extensively   Previously had severe vertigo, but has not had any episodes in ~3 years.   Plan  Continue current medications   Hyperlipidemia   LDL goal < 100  Last lipids Lab Results  Component Value Date   CHOL 151 01/13/2019   HDL 44.90 01/13/2019   LDLCALC 92 01/13/2019   TRIG 72.0 01/13/2019   CHOLHDL 3 01/13/2019   Hepatic Function Latest Ref Rng & Units 02/22/2020 01/13/2019 12/04/2017  Total Protein 6.0 - 8.5 g/dL 6.5 7.2 -  Albumin 3.7 - 4.7 g/dL 3.7 3.6 -  AST 0 - 40 IU/L _0 ALT 0 - 32 IU/L 19 19 32  Alk Phosphatase 48 - 121 IU/L 85 73 95  Total Bilirubin 0.0 - 1.2 mg/dL 0.3 0.4 -     The 10-year ASCVD risk score Mikey Bussing DC Jr., et al., 2013) is: 19.6%   Values used to calculate  the score:     Age: 4 years     Sex: Female     Is Non-Hispanic African American: No     Diabetic: No     Tobacco smoker: No     Systolic Blood Pressure: 097 mmHg     Is BP treated: Yes     HDL Cholesterol: 44.9 mg/dL     Total Cholesterol: 151 mg/dL   Patient has failed these meds in past: NA Patient is currently controlled on the following medications:  . Aspirin 81 mg daily  . Omega-3 Fish Oil 1000 mg daily   We discussed:  diet and exercise extensively. Discussed at length patient's cardiovascular risk and the importance of blood pressure and DM control to mitigate risk. Patient is not a candidate for statin therapy at this time.   Plan  Continue current medications  Prediabetes   A1c goal <6.5%  Recent Relevant Labs: Lab Results  Component Value Date/Time   HGBA1C 5.9 (H) 02/22/2020 01:39 PM   HGBA1C 5.9 05/10/2019  10:18 AM   GFR 88.45 04/18/2020 09:42 AM   GFR 90.18 10/12/2019 01:48 PM   MICROALBUR 1.0 05/10/2019 10:18 AM    Last diabetic Eye exam: No results found for: HMDIABEYEEXA  Last diabetic Foot exam: No results found for: HMDIABFOOTEX   Checking BG: Never  Recent FBG Readings: NA Recent pre-meal BG readings: NA Recent 2hr PP BG readings:  NA Recent HS BG readings: NA  Patient has failed these meds in past: NA Patient is currently controlled on the following medications: Marland Kitchen Metformin 500 mg daily   We discussed: diet and exercise extensively  Plan  Continue current medications  Osteopenia / Osteoporosis   Last DEXA Scan: 01/22/19   T-Score femoral neck: -2.8  T-Score total hip: NA  T-Score lumbar spine: -0.9  T-Score forearm radius: NA  10-year probability of major osteoporotic fracture: NA  10-year probability of hip fracture: NA  Vit D, 25-Hydroxy  Date Value Ref Range Status  02/22/2020 42.1 30.0 - 100.0 ng/mL Final    Comment:    Vitamin D deficiency has been defined by the Institute of Medicine and an Endocrine Society practice guideline as a level of serum 25-OH vitamin D less than 20 ng/mL (1,2). The Endocrine Society went on to further define vitamin D insufficiency as a level between 21 and 29 ng/mL (2). 1. IOM (Institute of Medicine). 2010. Dietary reference    intakes for calcium and D. [place]: The    [employer]. 2. Holick MF, Binkley [place], Bischoff-Ferrari HA, et al.    Evaluation, treatment, and prevention of vitamin D    deficiency: an Endocrine Society clinical practice    guideline. JCEM. 2011 Jul; 96(7):1911-30.      Patient is a candidate for pharmacologic treatment due to T-Score < -2.5 in femoral neck  Patient has failed these meds in past: none Patient is currently controlled on the following medications:  . Prolia 60 mg SQ every 6 months (Next January)  . Calcium + Vit D 500 mg - 200 units daily  . Vitamin D3 1000 units  daily   We discussed:  Recommend [phone] units of vitamin D daily. Recommend 1200 mg of calcium daily from dietary and supplemental sources. Patient reports Prolia is very expensive for her. Patient is unsure that she has ever tried alendronate or any other bisphosphonate therapy.   Plan  Recommend stopping Prolia injections (cost)  Recommend Alendronate 70 mg weekly   Osteoarthritis  Patient has failed these meds in past: NA Patient is currently controlled on the following medications:  Marland Kitchen Glucosamine-chondroitin 2000 units daily  . Naproxen 500 mg twice daily PRN   We discussed:  Worsens in winter. Left knee is worst. Discussed risks of NSAID use and to minimize whenever possible.  Plan  Recommend Tylenol Arthritis 650 mg CR twice daily   Allergic Rhinitis    Patient has failed these meds in past: NA Patient is currently controlled on the following medications:  . Saline nasal spray PRN   We discussed:  Has seasonal allergies, tries to avoid using Flonase or antihistamines.   Plan  Continue current medications   Misc / OTC   . Magnesium 500 mg daily (ran out) . Multivitamin daily . Trazodone 50 mg   . Melatonin 10 mg QHS  . Probiotic daily   Plan  Continue control with diet and exercise  Vaccines   Reviewed and discussed patient's vaccination history.    Immunization History  Administered Date(s) Administered  . Fluad Quad(high Dose 65+) 05/25/2019, 05/22/2020  . Influenza,inj,Quad PF,6+ Mos 05/12/2018  . PFIZER SARS-COV-2 Vaccination 10/03/2019, 10/26/2019    Medication Management   [facility]:  [id number] [place] [id number]: [phone]: [phone] Uses pill box? Yes Pt endorses 100% compliance  We discussed: Current pharmacy is preferred with insurance plan and patient is satisfied with pharmacy services  Plan  Continue current  medication management strategy  Follow up: 6 month phone visit  ABrightonPharmacist [facility] [phone]

## 2020-07-27 NOTE — Patient Instructions (Signed)
Visit Information It was great speaking with you today!  Please let me know if you have any questions about our visit.  Goals Addressed            This Visit's Progress   . Chronic Care Management       CARE PLAN ENTRY (see longitudinal plan of care for additional care plan information)  Current Barriers:  . Chronic Disease Management support, education, and care coordination needs related to Hypertension, Hyperlipidemia, Depression, Anxiety, Osteoporosis, Osteoarthritis, Allergic Rhinitis and Prediabetes   Hypertension BP Readings from Last 3 Encounters:  07/18/20 118/72  06/28/20 136/77  06/13/20 (!) 148/64   . Pharmacist Clinical Goal(s): o Over the next 90 days, patient will work with PharmD and providers to maintain BP goal <140/90 . Current regimen:  o Chlorthalidone 25 mg daily  . Interventions: o Discussed low salt diet and exercising as tolerated extensively o Will initiate blood pressure monitoring plan  . Patient self care activities - Over the next 90 days, patient will: o Check Blood Pressure Weekly, document, and provide at future appointments o Ensure daily salt intake < 2300 mg/day  Hyperlipidemia Lab Results  Component Value Date/Time   LDLCALC 92 01/13/2019 09:13 AM   . Pharmacist Clinical Goal(s): o Over the next 90 days, patient will work with PharmD and providers to maintain LDL goal < 10 . Current regimen:  o None . Interventions: o Discussed low cholesterol diet and exercising as tolerated extensively o Will initiate cholesterol monitoring plan   Prediabetes Lab Results  Component Value Date/Time   HGBA1C 5.9 (H) 02/22/2020 01:39 PM   HGBA1C 5.9 05/10/2019 10:18 AM   . Pharmacist Clinical Goal(s): o Over the next 90 days, patient will work with PharmD and providers to maintain A1c goal <6.5% . Current regimen:  o Metformin 500 mg daily  . Interventions: o Discussed carbohydrate counting and exercising as tolerated extensively o Will  initiate blood sugar monitoring plan  . Patient self care activities - Over the next 90 days, patient will: o Contact provider with any episodes of hypoglycemia  Medication management . Pharmacist Clinical Goal(s): o Over the next 90 days, patient will work with PharmD and providers to maintain optimal medication adherence . [facility] . Interventions o Comprehensive medication review performed. o Continue current medication management strategy . Patient self care activities - Over the next 90 days, patient will: o Take medications as prescribed o Report any questions or concerns to PharmD and/or provider(s)       Ms. Mcclellan was given information about Chronic Care Management services today including:  1. CCM service includes personalized support from designated clinical staff supervised by her physician, including individualized plan of care and coordination with other care providers 2. 24/7 contact phone numbers for assistance for urgent and routine care needs. 3. Standard insurance, coinsurance, copays and deductibles apply for chronic care management only during months in which we provide at least 20 minutes of these services. Most insurances cover these services at 100%, however patients may be responsible for any copay, coinsurance and/or deductible if applicable. This service may help you avoid the need for more expensive face-to-face services. 4. Only one practitioner may furnish and bill the service in a calendar month. 5. The patient may stop CCM services at any time (effective at the end of the month) by phone call to the office staff.  Patient agreed to services and verbal consent obtained.   The patient verbalized understanding of instructions, educational  materials, and care plan provided today and agreed to receive a mailed copy of patient instructions, educational materials, and care plan.  Telephone follow up appointment with pharmacy team member scheduled for:  01/30/20 at 8:30 AM  [facility]  [phone]

## 2020-07-31 NOTE — Progress Notes (Signed)
Subjective:   Kristi Hunter is a 79 y.o. female who presents for Medicare Annual (Subsequent) preventive examination.  Review of Systems     Cardiac Risk Factors include: hypertension;dyslipidemia;advanced age (>64men, >73 women);obesity (BMI >30kg/m2);sedentary lifestyle     Objective:    Today's Vitals   08/01/20 0945 08/01/20 0949  BP: 118/72   Pulse: 75   Resp: 16   Temp: (!) 97.1 F (36.2 C)   TempSrc: Temporal   SpO2: 97%   Weight: 259 lb (117.5 kg)   Height: 5\' 2"  (1.575 m)   PainSc:  4    Body mass index is 47.37 kg/m.  Advanced Directives 08/01/2020 07/28/2019  Does Patient Have a Medical Advance Directive? Yes Yes  Type of Paramedic of [place];Living will [place];Living will  Does patient want to make changes to medical advance directive? - No - Patient declined  Copy of [place] in Chart? No - copy requested No - copy requested    Current Medications (verified) Outpatient Encounter Medications as of 08/01/2020  Medication Sig  . aspirin 81 MG EC tablet Take 81 mg by mouth daily. Swallow whole.  . calcium-vitamin D (OSCAL WITH D) 500-200 MG-UNIT tablet Take 1 tablet by mouth 2 (two) times daily.  . chlorthalidone (HYGROTON) 25 MG tablet Take 1 tablet (25 mg total) by mouth daily.  Marland Kitchen GLUCOSAMINE-CHONDROITIN-VIT D3 PO Take 2,000 Units by mouth daily.  . Lactobacillus (PROBIOTIC ACIDOPHILUS PO) Take 1 capsule by mouth daily.  . Melatonin 10 MG TABS Take 10 mg by mouth at bedtime.  . metFORMIN (GLUCOPHAGE) 500 MG tablet Take 1 tablet (500 mg total) by mouth in the morning.  . Multiple Vitamin (MULTIVITAMIN) tablet Take 1 tablet by mouth daily.  . naproxen (NAPROSYN) 500 MG tablet Take one twice daily as needed for pain with food.  Marland Kitchen omega-3 fish oil (MAXEPA) 1000 MG CAPS capsule Take 1 capsule by mouth daily.   Marland Kitchen PROLIA 60 MG/ML SOSY injection   . Turmeric 500 MG CAPS Take 1 capsule by mouth  daily.  . Magnesium 500 MG CAPS Take 1 capsule by mouth daily. (Patient not taking: No sig reported)  . traZODone (DESYREL) 50 MG tablet  (Patient not taking: No sig reported)   No facility-administered encounter medications on file as of 08/01/2020.    Allergies (verified) Percocet [oxycodone-acetaminophen]   History: Past Medical History:  Diagnosis Date  . Anemia   . Anxiety   . Arthritis   . Back pain   . Cancer ([place])    uterine  . Chest pain   . Chicken pox   . Chronic kidney disease   . Constipation   . Emphysema of lung ([place])   . Glaucoma   . Hypertension   . Joint pain   . Lower extremity edema   . Lymphedema   . Osteoarthritis   . Prediabetes   . Shortness of breath   . Urine incontinence   . Uterine cancer ([place])   . UTI (urinary tract infection)   . Vertigo   . Vitamin B 12 deficiency    during pregnancy   Past Surgical History:  Procedure Laterality Date  . ABDOMINAL HYSTERECTOMY  2011  . REPLACEMENT TOTAL KNEE Right 2012   Family History  Problem Relation Age of Onset  . Cancer Father        skin  . Alcohol abuse Father   . Asthma Father   . COPD Father   .  Heart attack Father   . Heart disease Father   . Obesity Father   . Cancer Maternal Grandfather 72       colon cancer  . Heart attack Mother   . Heart disease Mother   . Hyperlipidemia Mother   . Hypertension Mother   . Stroke Mother   . Miscarriages / [name] Mother   . Diabetes Mother   . Thyroid disease Mother   . Cancer Mother   . Obesity Mother   . Alcohol abuse Brother   . Arthritis Brother   . Drug abuse Brother   . Depression Daughter   . Hypertension Daughter   . Mental illness Daughter   . Depression Son   . Arthritis Maternal Grandmother   . Diabetes Maternal Grandmother   . Hyperlipidemia Maternal Grandmother   . Alcohol abuse Paternal Grandfather   . Arthritis Paternal Grandfather   . Cirrhosis Paternal Grandfather   . Alcohol abuse Brother   . Breast  cancer Paternal Aunt    Social History   Socioeconomic History  . Marital status: Widowed    Spouse name: Not on file  . Number of children: 3  . Years of education: Not on file  . Highest education level: Not on file  Occupational History  . Occupation: Reitired Engineer, maintenance (IT)    Comment: Retired Engineer, maintenance (IT)  Tobacco Use  . Smoking status: Former Smoker    Types: Cigarettes    Quit date: 08/12/1982    Years since quitting: 37.9  . Smokeless tobacco: Never Used  Vaping Use  . Vaping Use: Never used  Substance and Sexual Activity  . Alcohol use: Yes    Alcohol/week: 1.0 standard drink    Types: 1 Glasses of wine per week    Comment: every 1-2 wks.   . Drug use: Never  . Sexual activity: Not on file  Other Topics Concern  . Not on file  Social History Narrative  . Not on file   Social Determinants of Health   Financial Resource Strain: Low Risk   . Difficulty of Paying Living Expenses: Not hard at all  Food Insecurity: No Food Insecurity  . Worried About Charity fundraiser in the Last Year: Never true  . Ran Out of Food in the Last Year: Never true  Transportation Needs: No Transportation Needs  . Lack of Transportation (Medical): No  . Lack of Transportation (Non-Medical): No  Physical Activity: Inactive  . Days of Exercise per Week: 0 days  . Minutes of Exercise per Session: 0 min  Stress: No Stress Concern Present  . Feeling of Stress : Only a little  Social Connections: Moderately Integrated  . Frequency of Communication with Friends and Family: More than three times a week  . Frequency of Social Gatherings with Friends and Family: More than three times a week  . Attends Religious Services: More than 4 times per year  . Active Member of Clubs or Organizations: Yes  . Attends Archivist Meetings: 1 to 4 times per year  . Marital Status: Widowed    Tobacco Counseling Counseling given: Not Answered   Clinical Intake:  Pre-visit preparation completed: Yes  Pain :  0-10 Pain Score: 4  Pain Type: Chronic pain Pain Location: Generalized Pain Onset: More than a month ago Pain Frequency: Constant     Nutritional Status: BMI > 30  Obese Nutritional Risks: None Diabetes: No  How often do you need to have someone help you when you read instructions,  pamphlets, or other written materials from your doctor or pharmacy?: 1 - Never What is the last grade level you completed in school?: 4 yrs of college  Diabetic?No  Interpreter Needed?: No  Information entered by :: Caroleen Hamman LPN   Activities of Daily Living In your present state of health, do you have any difficulty performing the following activities: 08/01/2020  Hearing? N  Vision? N  Difficulty concentrating or making decisions? Y  Comment occasionally forgets names  Walking or climbing stairs? N  Dressing or bathing? N  Doing errands, shopping? N  Preparing Food and eating ? N  Using the Toilet? N  In the past six months, have you accidently leaked urine? Y  Do you have problems with loss of bowel control? N  Managing your Medications? N  Managing your Finances? N  Housekeeping or managing your Housekeeping? N  Some recent data might be hidden    Patient Care Team: Libby Maw, MD as PCP - General (Family Medicine) Germaine Pomfret, [facility] as Pharmacist (Pharmacist)  Indicate any recent Medical Services you may have received from other than [facility] providers in the past year (date may be approximate).     Assessment:   This is a routine wellness examination for Kristi Hunter.  Hearing/Vision screen  Hearing Screening   125Hz  250Hz  500Hz  1000Hz  2000Hz  3000Hz  4000Hz  6000Hz  8000Hz   Right ear:           Left ear:           Comments: No issues  Vision Screening Comments: Reading glasses Last eye exam-3 months ago-Dr. Katy Fitch  Dietary issues and exercise activities discussed: Current Exercise Habits: The patient does not participate in regular exercise at present,  Exercise limited by: None identified  Goals    . Chronic Care Management     CARE PLAN ENTRY (see longitudinal plan of care for additional care plan information)  Current Barriers:  . Chronic Disease Management support, education, and care coordination needs related to Hypertension, Hyperlipidemia, Depression, Anxiety, Osteoporosis, Osteoarthritis, Allergic Rhinitis and Prediabetes   Hypertension BP Readings from Last 3 Encounters:  07/18/20 118/72  06/28/20 136/77  06/13/20 (!) 148/64   . Pharmacist Clinical Goal(s): o Over the next 90 days, patient will work with PharmD and providers to maintain BP goal <140/90 . Current regimen:  o Chlorthalidone 25 mg daily  . Interventions: o Discussed low salt diet and exercising as tolerated extensively o Will initiate blood pressure monitoring plan  . Patient self care activities - Over the next 90 days, patient will: o Check Blood Pressure Weekly, document, and provide at future appointments o Ensure daily salt intake < 2300 mg/day  Hyperlipidemia Lab Results  Component Value Date/Time   LDLCALC 92 01/13/2019 09:13 AM   . Pharmacist Clinical Goal(s): o Over the next 90 days, patient will work with PharmD and providers to maintain LDL goal < 10 . Current regimen:  o None . Interventions: o Discussed low cholesterol diet and exercising as tolerated extensively o Will initiate cholesterol monitoring plan   Prediabetes Lab Results  Component Value Date/Time   HGBA1C 5.9 (H) 02/22/2020 01:39 PM   HGBA1C 5.9 05/10/2019 10:18 AM   . Pharmacist Clinical Goal(s): o Over the next 90 days, patient will work with PharmD and providers to maintain A1c goal <6.5% . Current regimen:  o Metformin 500 mg daily  . Interventions: o Discussed carbohydrate counting and exercising as tolerated extensively o Will initiate blood sugar monitoring plan  . Patient  self care activities - Over the next 90 days, patient will: o Contact provider with  any episodes of hypoglycemia  Medication management . Pharmacist Clinical Goal(s): o Over the next 90 days, patient will work with PharmD and providers to maintain optimal medication adherence . Current pharmacy: [facility] . Interventions o Comprehensive medication review performed. o Continue current medication management strategy . Patient self care activities - Over the next 90 days, patient will: o Take medications as prescribed o Report any questions or concerns to PharmD and/or provider(s)    . Patient Stated     Increase physical activity once knee heals.     . Patient Stated     Working on losing weight      Depression Screen PHQ 2/9 Scores 08/01/2020 02/22/2020 01/18/2020 07/28/2019 08/14/2018  PHQ - 2 Score 0 4 0 0 0  PHQ- 9 Score - 18 - - -  Exception Documentation - - Other- indicate reason in comment box - -    Fall Risk Fall Risk  08/01/2020 07/28/2019 08/14/2018  Falls in the past year? 0 0 0  Number falls in past yr: 0 - -  Injury with Fall? 0 - -  Follow up Falls prevention discussed Education provided;Falls prevention discussed -    FALL RISK PREVENTION PERTAINING TO THE HOME:  Any stairs in or around the home? No  Home free of loose throw rugs in walkways, pet beds, electrical cords, etc? Yes  Adequate lighting in your home to reduce risk of falls? Yes   ASSISTIVE DEVICES UTILIZED TO PREVENT FALLS:  Life alert? No  Use of a cane, walker or w/c? No  Grab bars in the bathroom? Yes  Shower chair or bench in shower? No  Elevated toilet seat or a handicapped toilet? No   TIMED UP AND GO:  Was the test performed? Yes .  Length of time to ambulate 10 feet: 10 sec.   Gait slow and steady without use of assistive device  Cognitive Function:Normal cognitive status assessed by direct observation by this Nurse Health Advisor. No abnormalities found.          Immunizations Immunization History  Administered Date(s) Administered  . Fluad Quad(high Dose 65+)  05/25/2019, 05/22/2020  . Influenza,inj,Quad PF,6+ Mos 05/12/2018  . PFIZER SARS-COV-2 Vaccination 10/03/2019, 10/26/2019, 06/16/2020    TDAP status: Due, Education has been provided regarding the importance of this vaccine. Advised may receive this vaccine at local pharmacy or Health Dept. Aware to provide a copy of the vaccination record if obtained from local pharmacy or Health Dept. Verbalized acceptance and understanding.  Flu Vaccine status: Up to date  Pneumococcal vaccine status: Up to date  Covid-19 vaccine status: Completed vaccines  Qualifies for Shingles Vaccine? Yes   Zostavax completed No   Shingrix Completed?: No.    Education has been provided regarding the importance of this vaccine. Patient has been advised to call insurance company to determine out of pocket expense if they have not yet received this vaccine. Advised may also receive vaccine at local pharmacy or Health Dept. Verbalized acceptance and understanding.  Screening Tests Health Maintenance  Topic Date Due  . Hepatitis C Screening  Never done  . TETANUS/TDAP  Never done  . PNA vac Low Risk Adult (1 of 2 - PCV13) Never done  . COVID-19 Vaccine (4 - Booster for Pfizer series) 12/14/2020  . INFLUENZA VACCINE  Completed  . DEXA SCAN  Completed    Health Maintenance  Health Maintenance Due  Topic Date Due  . Hepatitis C Screening  Never done  . TETANUS/TDAP  Never done  . PNA vac Low Risk Adult (1 of 2 - PCV13) Never done    Colorectal cancer screening: No longer required.   Mammogram status: Ordered today. Pt provided with contact info and advised to call to schedule appt.   Bone Density status: Completed 01/22/2019. Results reflect: Bone density results: OSTEOPOROSIS. Repeat every 2 years.  Lung Cancer Screening: (Low Dose CT Chest recommended if Age 21-80 years, 30 pack-year currently smoking OR have quit w/in 15years.) does not qualify.    Additional Screening:  Hepatitis C Screening: does  not qualify  Vision Screening: Recommended annual ophthalmology exams for early detection of glaucoma and other disorders of the eye. Is the patient up to date with their annual eye exam?  Yes  Who is the provider or what is the name of the office in which the patient attends annual eye exams? Dr. Katy Fitch   Dental Screening: Recommended annual dental exams for proper oral hygiene  Community Resource Referral / Chronic Care Management: CRR required this visit?  No   CCM required this visit?  No      Plan:     I have personally reviewed and noted the following in the patient's chart:   . Medical and social history . Use of alcohol, tobacco or illicit drugs  . Current medications and supplements . Functional ability and status . Nutritional status . Physical activity . Advanced directives . List of other physicians . Hospitalizations, surgeries, and ER visits in previous 12 months . Vitals . Screenings to include cognitive, depression, and falls . Referrals and appointments  In addition, I have reviewed and discussed with patient certain preventive protocols, quality metrics, and best practice recommendations. A written personalized care plan for preventive services as well as general preventive health recommendations were provided to patient.   Patient to access AVS via mychart.   Marta Antu, LPN   [id number]  Nurse Health Advisor  Nurse Notes: None

## 2020-08-01 ENCOUNTER — Ambulatory Visit (INDEPENDENT_AMBULATORY_CARE_PROVIDER_SITE_OTHER): Payer: Medicare HMO

## 2020-08-01 ENCOUNTER — Other Ambulatory Visit: Payer: Self-pay

## 2020-08-01 ENCOUNTER — Telehealth: Payer: Self-pay

## 2020-08-01 VITALS — BP 118/72 | HR 75 | Temp 97.1°F | Resp 16 | Ht 62.0 in | Wt 259.0 lb

## 2020-08-01 DIAGNOSIS — Z0000 Encounter for general adult medical examination without abnormal findings: Secondary | ICD-10-CM

## 2020-08-01 DIAGNOSIS — Z1231 Encounter for screening mammogram for malignant neoplasm of breast: Secondary | ICD-10-CM | POA: Diagnosis not present

## 2020-08-01 NOTE — Patient Instructions (Signed)
Kristi Hunter , Thank you for taking time to come for your Medicare Wellness Visit. I appreciate your ongoing commitment to your health goals. Please review the following plan we discussed and let me know if I can assist you in the future.   Screening recommendations/referrals: Colonoscopy: No longer required Mammogram: Ordered today. Someone will be calling you to schedule. Bone Density: Completed 01/22/2019- Due-01/21/2021 Recommended yearly ophthalmology/optometry visit for glaucoma screening and checkup Recommended yearly dental visit for hygiene and checkup  Vaccinations: Influenza vaccine: Up to date Pneumococcal vaccine: Completed vaccines Tdap vaccine: Discuss with pharmacy Shingles vaccine: Discuss with pharmacy   Covid-19:Completed vaccines  Advanced directives: Please bring a copy for your chart  Conditions/risks identified: See problem list  Next appointment: Follow up in one year for your annual wellness visit 08/21/2021 @ 9:45   Preventive Care 77 Years and Older, Female Preventive care refers to lifestyle choices and visits with your health care provider that can promote health and wellness. What does preventive care include?  A yearly physical exam. This is also called an annual well check.  Dental exams once or twice a year.  Routine eye exams. Ask your health care provider how often you should have your eyes checked.  Personal lifestyle choices, including:  Daily care of your teeth and gums.  Regular physical activity.  Eating a healthy diet.  Avoiding tobacco and drug use.  Limiting alcohol use.  Practicing safe sex.  Taking low-dose aspirin every day.  Taking vitamin and mineral supplements as recommended by your health care provider. What happens during an annual well check? The services and screenings done by your health care provider during your annual well check will depend on your age, overall health, lifestyle risk factors, and family history of  disease. Counseling  Your health care provider may ask you questions about your:  Alcohol use.  Tobacco use.  Drug use.  Emotional well-being.  Home and relationship well-being.  Sexual activity.  Eating habits.  History of falls.  Memory and ability to understand (cognition).  Work and work Statistician.  Reproductive health. Screening  You may have the following tests or measurements:  Height, weight, and BMI.  Blood pressure.  Lipid and cholesterol levels. These may be checked every 5 years, or more frequently if you are over 29 years old.  Skin check.  Lung cancer screening. You may have this screening every year starting at age 77 if you have a 30-pack-year history of smoking and currently smoke or have quit within the past 15 years.  Fecal occult blood test (FOBT) of the stool. You may have this test every year starting at age 77.  Flexible sigmoidoscopy or colonoscopy. You may have a sigmoidoscopy every 5 years or a colonoscopy every 10 years starting at age 77.  Hepatitis C blood test.  Hepatitis B blood test.  Sexually transmitted disease (STD) testing.  Diabetes screening. This is done by checking your blood sugar (glucose) after you have not eaten for a while (fasting). You may have this done every 1-3 years.  Bone density scan. This is done to screen for osteoporosis. You may have this done starting at age 77.  Mammogram. This may be done every 1-2 years. Talk to your health care provider about how often you should have regular mammograms. Talk with your health care provider about your test results, treatment options, and if necessary, the need for more tests. Vaccines  Your health care provider may recommend certain vaccines, such as:  Influenza  vaccine. This is recommended every year.  Tetanus, diphtheria, and acellular pertussis (Tdap, Td) vaccine. You may need a Td booster every 10 years.  Zoster vaccine. You may need this after age  77.  Pneumococcal 13-valent conjugate (PCV13) vaccine. One dose is recommended after age 1.  Pneumococcal polysaccharide (PPSV23) vaccine. One dose is recommended after age 74. Talk to your health care provider about which screenings and vaccines you need and how often you need them. This information is not intended to replace advice given to you by your health care provider. Make sure you discuss any questions you have with your health care provider. Document Released: 08/25/2015 Document Revised: 04/17/2016 Document Reviewed: 05/30/2015 Elsevier Interactive Patient Education  2017 [place] Prevention in the Home Falls can cause injuries. They can happen to people of all ages. There are many things you can do to make your home safe and to help prevent falls. What can I do on the outside of my home?  Regularly fix the edges of walkways and driveways and fix any cracks.  Remove anything that might make you trip as you walk through a door, such as a raised step or threshold.  Trim any bushes or trees on the path to your home.  Use bright outdoor lighting.  Clear any walking paths of anything that might make someone trip, such as rocks or tools.  Regularly check to see if handrails are loose or broken. Make sure that both sides of any steps have handrails.  Any raised decks and porches should have guardrails on the edges.  Have any leaves, snow, or ice cleared regularly.  Use sand or salt on walking paths during winter.  Clean up any spills in your garage right away. This includes oil or grease spills. What can I do in the bathroom?  Use night lights.  Install grab bars by the toilet and in the tub and shower. Do not use towel bars as grab bars.  Use non-skid mats or decals in the tub or shower.  If you need to sit down in the shower, use a plastic, non-slip stool.  Keep the floor dry. Clean up any water that spills on the floor as soon as it happens.  Remove  soap buildup in the tub or shower regularly.  Attach bath mats securely with double-sided non-slip rug tape.  Do not have throw rugs and other things on the floor that can make you trip. What can I do in the bedroom?  Use night lights.  Make sure that you have a light by your bed that is easy to reach.  Do not use any sheets or blankets that are too big for your bed. They should not hang down onto the floor.  Have a firm chair that has side arms. You can use this for support while you get dressed.  Do not have throw rugs and other things on the floor that can make you trip. What can I do in the kitchen?  Clean up any spills right away.  Avoid walking on wet floors.  Keep items that you use a lot in easy-to-reach places.  If you need to reach something above you, use a strong step stool that has a grab bar.  Keep electrical cords out of the way.  Do not use floor polish or wax that makes floors slippery. If you must use wax, use non-skid floor wax.  Do not have throw rugs and other things on the floor that can  make you trip. What can I do with my stairs?  Do not leave any items on the stairs.  Make sure that there are handrails on both sides of the stairs and use them. Fix handrails that are broken or loose. Make sure that handrails are as long as the stairways.  Check any carpeting to make sure that it is firmly attached to the stairs. Fix any carpet that is loose or worn.  Avoid having throw rugs at the top or bottom of the stairs. If you do have throw rugs, attach them to the floor with carpet tape.  Make sure that you have a light switch at the top of the stairs and the bottom of the stairs. If you do not have them, ask someone to add them for you. What else can I do to help prevent falls?  Wear shoes that:  Do not have high heels.  Have rubber bottoms.  Are comfortable and fit you well.  Are closed at the toe. Do not wear sandals.  If you use a  stepladder:  Make sure that it is fully opened. Do not climb a closed stepladder.  Make sure that both sides of the stepladder are locked into place.  Ask someone to hold it for you, if possible.  Clearly mark and make sure that you can see:  Any grab bars or handrails.  First and last steps.  Where the edge of each step is.  Use tools that help you move around (mobility aids) if they are needed. These include:  Canes.  Walkers.  Scooters.  Crutches.  Turn on the lights when you go into a dark area. Replace any light bulbs as soon as they burn out.  Set up your furniture so you have a clear path. Avoid moving your furniture around.  If any of your floors are uneven, fix them.  If there are any pets around you, be aware of where they are.  Review your medicines with your doctor. Some medicines can make you feel dizzy. This can increase your chance of falling. Ask your doctor what other things that you can do to help prevent falls. This information is not intended to replace advice given to you by your health care provider. Make sure you discuss any questions you have with your health care provider. Document Released: 05/25/2009 Document Revised: 01/04/2016 Document Reviewed: 09/02/2014 Elsevier Interactive Patient Education  2017 [employer].

## 2020-08-10 ENCOUNTER — Other Ambulatory Visit: Payer: Self-pay | Admitting: Family

## 2020-08-10 ENCOUNTER — Telehealth: Payer: Self-pay

## 2020-08-10 DIAGNOSIS — R350 Frequency of micturition: Secondary | ICD-10-CM | POA: Diagnosis not present

## 2020-08-10 DIAGNOSIS — R3915 Urgency of urination: Secondary | ICD-10-CM | POA: Diagnosis not present

## 2020-08-10 MED ORDER — ALENDRONATE SODIUM 70 MG PO TABS: 70.0000 mg | ORAL_TABLET | ORAL | 11 refills | Status: DC

## 2020-08-10 NOTE — Telephone Encounter (Signed)
-----   Message from Gaspar Cola, [facility] sent at 08/10/2020 11:59 AM EST ----- Regarding: FW: CCM Pharmacist Recommendations Bessie,  Please reach out to [facility] and inform her we will be stopping her Prolia and starting alendronate 70 mg weekly. Please inform her of the following: Take in the morning, 30 minutes prior to food with 6-8 oz of water. Do not lie down for at least 30 minutes after taking.  I will plan for you to follow-up with her in 3-4 weeks to see how she is tolerating the alendronate.   Thanks, Garey Ham Clinical Pharmacist [facility]  [phone]   ----- Message ----- From: Eulis Foster, FNP Sent: 08/10/2020  11:24 AM EST To: Gaspar Cola, RPH Subject: RE: CCM Pharmacist Recommendations             Polia d/cc Start Alendronate 70 mg once weekly ----- Message ----- From: Gaspar Cola, [facility] Sent: 08/08/2020   7:00 AM EST To: Mliss Sax, MD Subject: CCM Pharmacist Recommendations                 Dr. Doreene Burke, I spoke with this patient earlier this month. She mentioned she has significant trouble affording Prolia. What would you think about switching her to alendronate 70 mg weekly? She asked that I wait to discuss this with you when you returned.   Please let me know your thoughts so I may communicate them with the patient. She is due for her next Prolia shot in January.   Garey Ham Clinical Pharmacist [facility]  [phone]

## 2020-08-10 NOTE — Progress Notes (Signed)
Spoke with patient to inform her per clinical pharmacist , after discussion with PCP they  will be stopping her Prolia and starting alendronate 70 mg weekly. Inform Patient  of the following per clinical pharamacist: Take in the morning, 30 minutes prior to food with 6-8 oz of water. Do not lie down for at least 30 minutes after taking.   CPA will plan  to follow-up with her in 3-4 weeks to see how she is tolerating the alendronate.   Patient verbalized understanding of the above and agrees with the plan above if it is cheaper then Prolia.  Everlean Cherry Clinical Pharmacist Assistant [phone]

## 2020-08-14 ENCOUNTER — Ambulatory Visit: Payer: Medicare HMO | Admitting: Family Medicine

## 2020-08-14 NOTE — Patient Instructions (Signed)
Please continue using your CPAP regularly. While your insurance requires that you use CPAP at least 4 hours each night on 70% of the nights, I recommend, that you not skip any nights and use it throughout the night if you can. Getting used to CPAP and staying with the treatment long term does take time and patience and discipline. Untreated obstructive sleep apnea when it is moderate to severe can have an adverse impact on cardiovascular health and raise her risk for heart disease, arrhythmias, hypertension, congestive heart failure, stroke and diabetes. Untreated obstructive sleep apnea causes sleep disruption, nonrestorative sleep, and sleep deprivation. This can have an impact on your day to day functioning and cause daytime sleepiness and impairment of cognitive function, memory loss, mood disturbance, and problems focussing. Using CPAP regularly can improve these symptoms.   We will continue to monitor for leak at home. If you have continued concerns, we can consider a mask refitting.   Follow up in 1 year   Sleep Apnea Sleep apnea affects breathing during sleep. It causes breathing to stop for a short time or to become shallow. It can also increase the risk of:  Heart attack.  Stroke.  Being very overweight (obese).  Diabetes.  Heart failure.  Irregular heartbeat. The goal of treatment is to help you breathe normally again. What are the causes? There are three kinds of sleep apnea:  Obstructive sleep apnea. This is caused by a blocked or collapsed airway.  Central sleep apnea. This happens when the brain does not send the right signals to the muscles that control breathing.  Mixed sleep apnea. This is a combination of obstructive and central sleep apnea. The most common cause of this condition is a collapsed or blocked airway. This can happen if:  Your throat muscles are too relaxed.  Your tongue and tonsils are too large.  You are overweight.  Your airway is too  small. What increases the risk?  Being overweight.  Smoking.  Having a small airway.  Being older.  Being female.  Drinking alcohol.  Taking medicines to calm yourself (sedatives or tranquilizers).  Having family members with the condition. What are the signs or symptoms?  Trouble staying asleep.  Being sleepy or tired during the day.  Getting angry a lot.  Loud snoring.  Headaches in the morning.  Not being able to focus your mind (concentrate).  Forgetting things.  Less interest in sex.  Mood swings.  Personality changes.  Feelings of sadness (depression).  Waking up a lot during the night to pee (urinate).  Dry mouth.  Sore throat. How is this diagnosed?  Your medical history.  A physical exam.  A test that is done when you are sleeping (sleep study). The test is most often done in a sleep lab but may also be done at home. How is this treated?   Sleeping on your side.  Using a medicine to get rid of mucus in your nose (decongestant).  Avoiding the use of alcohol, medicines to help you relax, or certain pain medicines (narcotics).  Losing weight, if needed.  Changing your diet.  Not smoking.  Using a machine to open your airway while you sleep, such as: ? An oral appliance. This is a mouthpiece that shifts your lower jaw forward. ? A CPAP device. This device blows air through a mask when you breathe out (exhale). ? An EPAP device. This has valves that you put in each nostril. ? A BPAP device. This device blows air  through a mask when you breathe in (inhale) and breathe out.  Having surgery if other treatments do not work. It is important to get treatment for sleep apnea. Without treatment, it can lead to:  High blood pressure.  Coronary artery disease.  In men, not being able to have an erection (impotence).  Reduced thinking ability. Follow these instructions at home: Lifestyle  Make changes that your doctor recommends.  Eat a  healthy diet.  Lose weight if needed.  Avoid alcohol, medicines to help you relax, and some pain medicines.  Do not use any products that contain nicotine or tobacco, such as cigarettes, e-cigarettes, and chewing tobacco. If you need help quitting, ask your doctor. General instructions  Take over-the-counter and prescription medicines only as told by your doctor.  If you were given a machine to use while you sleep, use it only as told by your doctor.  If you are having surgery, make sure to tell your doctor you have sleep apnea. You may need to bring your device with you.  Keep all follow-up visits as told by your doctor. This is important. Contact a doctor if:  The machine that you were given to use during sleep bothers you or does not seem to be working.  You do not get better.  You get worse. Get help right away if:  Your chest hurts.  You have trouble breathing in enough air.  You have an uncomfortable feeling in your back, arms, or stomach.  You have trouble talking.  One side of your body feels weak.  A part of your face is hanging down. These symptoms may be an emergency. Do not wait to see if the symptoms will go away. Get medical help right away. Call your local emergency services (911 in the [place] Do not drive yourself to the hospital. Summary  This condition affects breathing during sleep.  The most common cause is a collapsed or blocked airway.  The goal of treatment is to help you breathe normally while you sleep. This information is not intended to replace advice given to you by your health care provider. Make sure you discuss any questions you have with your health care provider. Document Revised: 05/15/2018 Document Reviewed: 03/24/2018 Elsevier Patient Education  [place].

## 2020-08-14 NOTE — Progress Notes (Signed)
PATIENT: Kristi Hunter DOB: [birth year]  REASON FOR VISIT: follow up HISTORY FROM: patient  Chief Complaint  Patient presents with  . Follow-up    Cpap, pt doing well, rm 1     HISTORY OF PRESENT ILLNESS: Today 08/15/20 Kristi Hunter is a 80 y.o. female here today for follow up for OSA on CPAP.  HST in 03/2020 showed "There is moderate sleep apnea present, at an AHI of 22/h with strong REM dependence. REM AHI of 53/h".  She reports that she is sleeping better.  She is able to sleep for about 4 hours before having to wake up to urinate.  She does continue to have difficulty with fatigue.  She is not as active as she used to be due to Covid.  Compliance report dated 07/11/2020-08/09/2020 reveals that she is CPAP 29 in the past 30 days for compliance of 97%.  She used CPAP greater than 4 hours 27 of the past 30 days for compliance of 90%.  Average usage was 5 hours and 54 minutes.  Residual AHI was 1.0 on 6 to 16 cm of water pressure and an EPR of 3.  There was a leak noted in the 95th percentile of 29 L/min.   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 Chiefconcernaccording to patient : Rm 11 alone- here for sleep consult, no prior sleep study. Pt reports snoring is present.  I have the pleasure of seeing Kristi Hunter today,a right -handed Caucasian female with a possible sleep disorder.  She  has a past medical history of Arthritis, Cancer (HCC), Chicken pox, Chronic kidney disease, Emphysema of lung (HCC), Glaucoma, Hypertension, Urine incontinence, and UTI (urinary tract infection). polyuria, Poly arthritis, pain in the morning, stiffness.  Knee replacement in the right - lymphedema. Hand pain. Hip pain.   Sleeprelevant medical history: Nocturia- 4-5 times (!) limiting her nocturnal sleep to about 4 hours.   Tonsillectomy/ ENT : None. Sinusitis, and post nasal  drip.   Familymedical /sleep history: daughter with OSA, no insomnia, no sleep walkers.sons are short sleepers, daughter has hypersomnia.   Social history:Patient is retired from IT trainer and lives in a household with persons/ alone. Family status is widowed , grew up  [place], married to a jewish spouse, with 3 adult children, several grandchildren.  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  The patient currently works part time during tax season.  Pets are  present. Tobacco use: quit in 1985. ETOH use: rare  Caffeine intake in form of Coffee( 2 cups ) Soda(  One can a day) Tea ( rare - in restaurants, hot tea at home is decaffeinated)  or energy drinks. Regular exercise- none .  Wants to go back to silver sneakers.  Hobbies : reading,   Sleep habits are as follows:The patient's dinner time is between 6 PM. The patient goes to bed at 11 PM and watches Tv in bed- asleep within 15 minutes. She continues to sleep for 1-2  hours, wakes for many, many bathroom breaks, the first time at 2 AM.   The preferred sleep position is the left side , with the support of 1-2  pillows.  She often changes to a recliner . She feels her legs are restless.  She uses body pillows , helping with joint pain. Has an adjustable bed- She looks  at the clock when she wakes.  Dreams are reportedly infrequent/ vivid.  7 -7.30 AM is the usual rise time. The patient wakes up spontaneously at 7 AM.  She reports not feeling refreshed or restored in AM, with symptoms such as joint stiffness and pain- , morning headaches, and residual fatigue. Naps are taken infrequently, lasting from 30 minutes and are more refreshing than nocturnal sleep    REVIEW OF SYSTEMS: Out of a complete 14 system review of symptoms, the patient complains only of the following symptoms, nocturia, fatigue and all other reviewed systems are negative.  ESS: 10 FSS:49   ALLERGIES: Allergies  Allergen  Reactions  . Percocet [Oxycodone-Acetaminophen]     HOME MEDICATIONS: Outpatient Medications Prior to Visit  Medication Sig Dispense Refill  . alendronate (FOSAMAX) 70 MG tablet Take 1 tablet (70 mg total) by mouth every 7 (seven) days. Take with a full glass of water on an empty stomach. 4 tablet 11  . aspirin 81 MG EC tablet Take 81 mg by mouth daily. Swallow whole.    . calcium-vitamin D (OSCAL WITH D) 500-200 MG-UNIT tablet Take 1 tablet by mouth 2 (two) times daily.    . chlorthalidone (HYGROTON) 25 MG tablet Take 1 tablet (25 mg total) by mouth daily. 90 tablet 1  . GLUCOSAMINE-CHONDROITIN-VIT D3 PO Take 2,000 Units by mouth daily.    . Lactobacillus (PROBIOTIC ACIDOPHILUS PO) Take 1 capsule by mouth daily.    . Magnesium 500 MG CAPS Take 1 capsule by mouth daily.    . Melatonin 10 MG TABS Take 10 mg by mouth at bedtime.    . metFORMIN (GLUCOPHAGE) 500 MG tablet Take 1 tablet (500 mg total) by mouth in the morning. 90 tablet 0  . Multiple Vitamin (MULTIVITAMIN) tablet Take 1 tablet by mouth daily.    . naproxen (NAPROSYN) 500 MG tablet Take one twice daily as needed for pain with food. 60 tablet 0  . omega-3 fish oil (MAXEPA) 1000 MG CAPS capsule Take 1 capsule by mouth daily.     . traZODone (DESYREL) 50 MG tablet     . Turmeric 500 MG CAPS Take 1 capsule by mouth daily.     No facility-administered medications prior to visit.    PAST MEDICAL HISTORY: Past Medical History:  Diagnosis Date  . Anemia   . Anxiety   . Arthritis   . Back pain   . Cancer (HCC)    uterine  . Chest pain   . Chicken pox   . Chronic kidney disease   . Constipation   . Emphysema of lung (HCC)   . Glaucoma   . Hypertension   . Joint pain   . Lower extremity edema   . Lymphedema   . Osteoarthritis   . Prediabetes   . Shortness of breath   . Urine incontinence   . Uterine cancer (HCC)   . UTI (urinary tract infection)   . Vertigo   . Vitamin B 12 deficiency    during pregnancy    PAST  SURGICAL HISTORY: Past Surgical History:  Procedure Laterality Date  . ABDOMINAL HYSTERECTOMY  2011  . REPLACEMENT TOTAL KNEE Right 2012    FAMILY HISTORY: Family History  Problem Relation Age of Onset  . Cancer Father        skin  . Alcohol abuse Father   . Asthma Father   . COPD Father   . Heart attack Father   . Heart disease Father   .  Obesity Father   . Cancer Maternal Grandfather 24       colon cancer  . Heart attack Mother   . Heart disease Mother   . Hyperlipidemia Mother   . Hypertension Mother   . Stroke Mother   . Miscarriages / [place] Mother   . Diabetes Mother   . Thyroid disease Mother   . Cancer Mother   . Obesity Mother   . Alcohol abuse Brother   . Arthritis Brother   . Drug abuse Brother   . Depression Daughter   . Hypertension Daughter   . Mental illness Daughter   . Depression Son   . Arthritis Maternal Grandmother   . Diabetes Maternal Grandmother   . Hyperlipidemia Maternal Grandmother   . Alcohol abuse Paternal Grandfather   . Arthritis Paternal Grandfather   . Cirrhosis Paternal Grandfather   . Alcohol abuse Brother   . Breast cancer Paternal Aunt     SOCIAL HISTORY: Social History   Socioeconomic History  . Marital status: Widowed    Spouse name: Not on file  . Number of children: 3  . Years of education: Not on file  . Highest education level: Not on file  Occupational History  . Occupation: Reitired IT trainer    Comment: Retired IT trainer  Tobacco Use  . Smoking status: Former Smoker    Types: Cigarettes    Quit date: 08/12/1982    Years since quitting: 38.0  . Smokeless tobacco: Never Used  Vaping Use  . Vaping Use: Never used  Substance and Sexual Activity  . Alcohol use: Yes    Alcohol/week: 1.0 standard drink    Types: 1 Glasses of wine per week    Comment: every 1-2 wks.   . Drug use: Never  . Sexual activity: Not on file  Other Topics Concern  . Not on file  Social History Narrative  . Not on file   Social  Determinants of Health   Financial Resource Strain: Low Risk   . Difficulty of Paying Living Expenses: Not hard at all  Food Insecurity: No Food Insecurity  . Worried About Programme researcher, broadcasting/film/video in the Last Year: Never true  . Ran Out of Food in the Last Year: Never true  Transportation Needs: No Transportation Needs  . Lack of Transportation (Medical): No  . Lack of Transportation (Non-Medical): No  Physical Activity: Inactive  . Days of Exercise per Week: 0 days  . Minutes of Exercise per Session: 0 min  Stress: No Stress Concern Present  . Feeling of Stress : Only a little  Social Connections: Moderately Integrated  . Frequency of Communication with Friends and Family: More than three times a week  . Frequency of Social Gatherings with Friends and Family: More than three times a week  . Attends Religious Services: More than 4 times per year  . Active Member of Clubs or Organizations: Yes  . Attends Banker Meetings: 1 to 4 times per year  . Marital Status: Widowed  Intimate Partner Violence: Not At Risk  . Fear of Current or Ex-Partner: No  . Emotionally Abused: No  . Physically Abused: No  . Sexually Abused: No     PHYSICAL EXAM  Vitals:   08/15/20 0931  BP: 132/84  Pulse: 81  Weight: 253 lb (114.8 kg)  Height: 5\' 5"  (1.651 m)   Body mass index is 42.1 kg/m.  Generalized: Well developed, in no acute distress  Cardiology: normal rate and rhythm, no murmur  noted Respiratory: clear to auscultation bilaterally  Neurological examination  Mentation: Alert oriented to time, place, history taking. Follows all commands speech and language fluent Cranial nerve II-XII: Pupils were equal round reactive to light. Extraocular movements were full, visual field were full  Motor: The motor testing reveals 5 over 5 strength of all 4 extremities. Good symmetric motor tone is noted throughout.  Gait and station: Gait is normal.    DIAGNOSTIC DATA (LABS, IMAGING,  TESTING) - I reviewed patient records, labs, notes, testing and imaging myself where available.  No flowsheet data found.   Lab Results  Component Value Date   WBC 8.1 02/22/2020   HGB 13.2 02/22/2020   HCT 41.3 02/22/2020   MCV 86 02/22/2020   PLT 332 02/22/2020      Component Value Date/Time   NA 139 04/18/2020 0942   NA 140 02/22/2020 1339   K 3.4 (L) 04/18/2020 0942   CL 99 04/18/2020 0942   CO2 31 04/18/2020 0942   GLUCOSE 89 04/18/2020 0942   BUN 19 04/18/2020 0942   BUN 18 02/22/2020 1339   CREATININE 0.65 04/18/2020 0942   CALCIUM 9.3 04/18/2020 0942   PROT 6.5 02/22/2020 1339   ALBUMIN 3.7 02/22/2020 1339   AST 25 02/22/2020 1339   ALT 19 02/22/2020 1339   ALKPHOS 85 02/22/2020 1339   BILITOT 0.3 02/22/2020 1339   GFRNONAA 90 02/22/2020 1339   GFRAA 104 02/22/2020 1339   Lab Results  Component Value Date   CHOL 151 01/13/2019   HDL 44.90 01/13/2019   LDLCALC 92 01/13/2019   TRIG 72.0 01/13/2019   CHOLHDL 3 01/13/2019   Lab Results  Component Value Date   HGBA1C 5.9 (H) 02/22/2020   Lab Results  Component Value Date   VITAMINB12 521 02/22/2020   Lab Results  Component Value Date   TSH 1.470 02/22/2020     ASSESSMENT AND PLAN 80 y.o. year old female  has a past medical history of Anemia, Anxiety, Arthritis, Back pain, Cancer (HCC), Chest pain, Chicken pox, Chronic kidney disease, Constipation, Emphysema of lung (HCC), Glaucoma, Hypertension, Joint pain, Lower extremity edema, Lymphedema, Osteoarthritis, Prediabetes, Shortness of breath, Urine incontinence, Uterine cancer (HCC), UTI (urinary tract infection), Vertigo, and Vitamin B 12 deficiency. here with     ICD-10-CM   1. OSA on CPAP  G47.33    Z99.89      Kristi Hunter is doing well on CPAP therapy. Compliance report reveals excellent compliance.  There was a leak noted in the 95th percentile of 29 L/min.  She will focus on correct mask placement and seal at home.  She will contact me with  any concerns and we will consider mask refitting.  I will repeat download in 6 to 8 weeks to assess leak.  She was encouraged to continue using CPAP nightly and for greater than 4 hours each night. We will update supply orders as indicated. Risks of untreated sleep apnea review and education materials provided. Healthy lifestyle habits encouraged.  She will follow up in 1 year, sooner if needed.  She verbalizes understanding and agreement with this plan.   No orders of the defined types were placed in this encounter.    No orders of the defined types were placed in this encounter.     I spent 15 minutes with the patient. 50% of this time was spent counseling and educating patient on plan of care and medications.    Shawnie Dapper, FNP-C 08/15/2020, 9:37 AM [facility] [address] [place], [place] [address] [phone]

## 2020-08-15 ENCOUNTER — Ambulatory Visit: Payer: Medicare HMO | Admitting: Family Medicine

## 2020-08-15 ENCOUNTER — Encounter: Payer: Self-pay | Admitting: Family Medicine

## 2020-08-15 VITALS — BP 132/84 | HR 81 | Ht 65.0 in | Wt 253.0 lb

## 2020-08-15 DIAGNOSIS — G4733 Obstructive sleep apnea (adult) (pediatric): Secondary | ICD-10-CM | POA: Diagnosis not present

## 2020-08-15 DIAGNOSIS — Z9989 Dependence on other enabling machines and devices: Secondary | ICD-10-CM | POA: Diagnosis not present

## 2020-08-16 ENCOUNTER — Telehealth: Payer: Self-pay

## 2020-08-16 NOTE — Progress Notes (Addendum)
Chronic Care Management Pharmacy Assistant   Name: Kristi Hunter  MRN: [id number] DOB: [birth year]  Reason for Encounter: Medication Review/ General Adherence Call.  PCP : Mliss Sax, MD  Allergies:   Allergies  Allergen Reactions   Percocet [Oxycodone-Acetaminophen]     Medications: Outpatient Encounter Medications as of 08/16/2020  Medication Sig   alendronate (FOSAMAX) 70 MG tablet Take 1 tablet (70 mg total) by mouth every 7 (seven) days. Take with a full glass of water on an empty stomach.   aspirin 81 MG EC tablet Take 81 mg by mouth daily. Swallow whole.   calcium-vitamin D (OSCAL WITH D) 500-200 MG-UNIT tablet Take 1 tablet by mouth 2 (two) times daily.   chlorthalidone (HYGROTON) 25 MG tablet Take 1 tablet (25 mg total) by mouth daily.   GLUCOSAMINE-CHONDROITIN-VIT D3 PO Take 2,000 Units by mouth daily.   Lactobacillus (PROBIOTIC ACIDOPHILUS PO) Take 1 capsule by mouth daily.   Magnesium 500 MG CAPS Take 1 capsule by mouth daily.   Melatonin 10 MG TABS Take 10 mg by mouth at bedtime.   metFORMIN (GLUCOPHAGE) 500 MG tablet Take 1 tablet (500 mg total) by mouth in the morning.   Multiple Vitamin (MULTIVITAMIN) tablet Take 1 tablet by mouth daily.   naproxen (NAPROSYN) 500 MG tablet Take one twice daily as needed for pain with food.   omega-3 fish oil (MAXEPA) 1000 MG CAPS capsule Take 1 capsule by mouth daily.    traZODone (DESYREL) 50 MG tablet    Turmeric 500 MG CAPS Take 1 capsule by mouth daily.   No facility-administered encounter medications on file as of 08/16/2020.    Current Diagnosis: Patient Active Problem List   Diagnosis Date Noted   Allergic rhinitis due to allergen 03/22/2020   OSA (obstructive sleep apnea) 03/22/2020   Left knee pain 01/18/2020   Other insomnia 01/18/2020   Apnea 01/18/2020   Class 3 severe obesity with serious comorbidity and body mass index (BMI) of 45.0 to 49.9 in adult [facility]) 01/18/2020   Musculoskeletal back pain  01/18/2020   Habitual snoring 10/12/2019   Nocturia 10/12/2019   OA (osteoarthritis) of knee 06/07/2019   Prediabetes 05/10/2019   Glaucoma of both eyes 05/10/2019   Essential hypertension 05/10/2019   Dry scalp 05/10/2019   Age-related osteoporosis without current pathological fracture 05/10/2019   Seborrheic keratosis 01/11/2019   Rosacea 01/11/2019   Family history of colon cancer 01/11/2019   Edema 08/27/2018   Primary osteoarthritis 08/27/2018   Sciatica of right side 08/27/2018   Depression, major, single episode, complete remission (HCC) 08/27/2018   Vitamin D deficiency 08/27/2018   Mixed hyperlipidemia 08/27/2018   Anxiety 08/27/2018   Hyperglycemia 08/27/2018   HTN (hypertension), benign 08/27/2018    Goals Addressed   None    How is the patient tolerating alendronate?  Patient states she has not started alendronate because she pick up her medication on Monday  On 08/15/2019 and she takes it weekly on fridays.Patient states the medication was free with no cost.  I will call back in two to three weeks to check if the patient is tolerating alendronate well.  Called patient and discussed medication adherence  with patient, No issues at this time with current medication.   Patient denies ED visit since her last CPP follow up.  Patient denies any side effects with her medication. Patient denies any problems with her current pharmacy   Follow-Up:  Pharmacist Review   Everlean Cherry Clinical Pharmacist Assistant [phone]  4 minutes spent in review, coordination, and documentation. Doristine Section Clinical Pharmacist [facility]  [phone]

## 2020-08-26 DIAGNOSIS — G4733 Obstructive sleep apnea (adult) (pediatric): Secondary | ICD-10-CM | POA: Diagnosis not present

## 2020-08-28 DIAGNOSIS — G4733 Obstructive sleep apnea (adult) (pediatric): Secondary | ICD-10-CM | POA: Diagnosis not present

## 2020-08-31 DIAGNOSIS — R3915 Urgency of urination: Secondary | ICD-10-CM | POA: Diagnosis not present

## 2020-08-31 DIAGNOSIS — R350 Frequency of micturition: Secondary | ICD-10-CM | POA: Diagnosis not present

## 2020-09-04 ENCOUNTER — Other Ambulatory Visit: Payer: Self-pay

## 2020-09-04 ENCOUNTER — Ambulatory Visit (INDEPENDENT_AMBULATORY_CARE_PROVIDER_SITE_OTHER): Payer: Medicare HMO | Admitting: Family Medicine

## 2020-09-04 ENCOUNTER — Encounter (INDEPENDENT_AMBULATORY_CARE_PROVIDER_SITE_OTHER): Payer: Self-pay | Admitting: Family Medicine

## 2020-09-04 VITALS — BP 132/60 | HR 64 | Temp 97.5°F | Ht 63.0 in | Wt 258.0 lb

## 2020-09-04 DIAGNOSIS — E559 Vitamin D deficiency, unspecified: Secondary | ICD-10-CM | POA: Diagnosis not present

## 2020-09-04 DIAGNOSIS — Z6841 Body Mass Index (BMI) 40.0 and over, adult: Secondary | ICD-10-CM | POA: Diagnosis not present

## 2020-09-04 DIAGNOSIS — R7303 Prediabetes: Secondary | ICD-10-CM | POA: Diagnosis not present

## 2020-09-04 MED ORDER — METFORMIN HCL 500 MG PO TABS: 500.0000 mg | ORAL_TABLET | Freq: Two times a day (BID) | ORAL | 0 refills | Status: DC

## 2020-09-04 NOTE — Progress Notes (Signed)
Chief Complaint:   OBESITY Kristi Hunter is here to discuss her progress with her obesity treatment plan along with follow-up of her obesity related diagnoses. Kristi Hunter is on the Category 2 Plan and states she is following her eating plan approximately 0% of the time. Kristi Hunter states she is doing 0 minutes 0 times per week.  Today's visit was #: 11 Starting weight: 267 lbs Starting date: 02/22/2020 Today's weight: 258 lbs Today's date: 09/04/2020 Total lbs lost to date: 9 Total lbs lost since last in-office visit: 0  Interim History: Kristi Hunter did some celebration eating over the holidays, but she is ready to get back on track. She is going to [place] in a few days and she plans to be gone for 2-4 weeks.  Subjective:   1. Pre-diabetes Kristi Hunter continues to work on diet, exercise, and weight loss, but she has been struggling more recently. She tolerates metformin well but still notes polyphagia.  2. Vitamin D deficiency Kristi Hunter is on OTC Vit D, and last Vit D level was not yet at goal. She denies nausea, vomiting, or muscle weakness.  Assessment/Plan:   1. Pre-diabetes Kristi Hunter will continue to work on weight loss, exercise, and decreasing simple carbohydrates to help decrease the risk of diabetes. Kristi Hunter agreed to increase metformin to 500 mg BID and we will refill for 90 days with no refills.  - metFORMIN (GLUCOPHAGE) 500 MG tablet; Take 1 tablet (500 mg total) by mouth 2 (two) times daily with a meal.  Dispense: 180 tablet; Refill: 0  2. Vitamin D deficiency Low Vitamin D level contributes to fatigue and are associated with obesity, breast, and colon cancer. Kristi Hunter agreed to continue taking OTC Vitamin D, and we will recheck labs in 1 month to reassess her progress. She will follow-up for routine testing of Vitamin D, at least 2-3 times per year to avoid over-replacement.  3. Class 3 severe obesity with serious comorbidity and body mass index (BMI) of 45.0 to 49.9 in  adult, unspecified obesity type [facility]) Kristi Hunter is currently in the action stage of change. As such, her goal is to continue with weight loss efforts. She has agreed to the Category 2 Plan.   Exercise goals: No exercise has been prescribed at this time.  Behavioral modification strategies: increasing lean protein intake and travel eating strategies.  Kristi Hunter has agreed to follow-up with our clinic in 4 to 5 weeks. She was informed of the importance of frequent follow-up visits to maximize her success with intensive lifestyle modifications for her multiple health conditions.   Objective:   Blood pressure 132/60, pulse 64, temperature (!) 97.5 F (36.4 C), height 5\' 3"  (1.6 m), weight 258 lb (117 kg), SpO2 96 %. Body mass index is 45.7 kg/m.  General: Cooperative, alert, well developed, in no acute distress. HEENT: Conjunctivae and lids unremarkable. Cardiovascular: Regular rhythm.  Lungs: Normal work of breathing. Neurologic: No focal deficits.   Lab Results  Component Value Date   CREATININE 0.65 04/18/2020   BUN 19 04/18/2020   NA 139 04/18/2020   K 3.4 (L) 04/18/2020   CL 99 04/18/2020   CO2 31 04/18/2020   Lab Results  Component Value Date   ALT 19 02/22/2020   AST 25 02/22/2020   ALKPHOS 85 02/22/2020   BILITOT 0.3 02/22/2020   Lab Results  Component Value Date   HGBA1C 5.9 (H) 02/22/2020   HGBA1C 5.9 05/10/2019   HGBA1C 6.0 01/13/2019   Lab Results  Component Value Date  INSULIN 21.5 02/22/2020   Lab Results  Component Value Date   TSH 1.470 02/22/2020   Lab Results  Component Value Date   CHOL 151 01/13/2019   HDL 44.90 01/13/2019   LDLCALC 92 01/13/2019   TRIG 72.0 01/13/2019   CHOLHDL 3 01/13/2019   Lab Results  Component Value Date   WBC 8.1 02/22/2020   HGB 13.2 02/22/2020   HCT 41.3 02/22/2020   MCV 86 02/22/2020   PLT 332 02/22/2020   No results found for: IRON, TIBC, FERRITIN  Obesity Behavioral Intervention:   Approximately 15  minutes were spent on the discussion below.  ASK: We discussed the diagnosis of obesity with Kristi Hunter today and Kristi Hunter agreed to give Kristi Hunter permission to discuss obesity behavioral modification therapy today.  ASSESS: [name] has the diagnosis of obesity and her BMI today is 45.71. Kristi Hunter is in the action stage of change.   ADVISE: Kristi Hunter was educated on the multiple health risks of obesity as well as the benefit of weight loss to improve her health. She was advised of the need for long term treatment and the importance of lifestyle modifications to improve her current health and to decrease her risk of future health problems.  AGREE: Multiple dietary modification options and treatment options were discussed and Kristi Hunter agreed to follow the recommendations documented in the above note.  ARRANGE: Kristi Hunter was educated on the importance of frequent visits to treat obesity as outlined per CMS and USPSTF guidelines and agreed to schedule her next follow up appointment today.  Attestation Statements:   Reviewed by clinician on day of visit: allergies, medications, problem list, medical history, surgical history, family history, social history, and previous encounter notes.   I, Kristi Hunter, am acting as transcriptionist for Kristi Nip, MD.  I have reviewed the above documentation for accuracy and completeness, and I agree with the above. -  Kristi Nip, MD

## 2020-09-26 DIAGNOSIS — G4733 Obstructive sleep apnea (adult) (pediatric): Secondary | ICD-10-CM | POA: Diagnosis not present

## 2020-09-28 DIAGNOSIS — G4733 Obstructive sleep apnea (adult) (pediatric): Secondary | ICD-10-CM | POA: Diagnosis not present

## 2020-10-03 DIAGNOSIS — G4733 Obstructive sleep apnea (adult) (pediatric): Secondary | ICD-10-CM | POA: Diagnosis not present

## 2020-10-06 DIAGNOSIS — R3915 Urgency of urination: Secondary | ICD-10-CM | POA: Diagnosis not present

## 2020-10-06 DIAGNOSIS — R350 Frequency of micturition: Secondary | ICD-10-CM | POA: Diagnosis not present

## 2020-10-08 ENCOUNTER — Other Ambulatory Visit: Payer: Self-pay | Admitting: Family Medicine

## 2020-10-08 DIAGNOSIS — I10 Essential (primary) hypertension: Secondary | ICD-10-CM

## 2020-10-09 ENCOUNTER — Other Ambulatory Visit: Payer: Self-pay

## 2020-10-09 ENCOUNTER — Encounter (INDEPENDENT_AMBULATORY_CARE_PROVIDER_SITE_OTHER): Payer: Self-pay | Admitting: Family Medicine

## 2020-10-09 ENCOUNTER — Ambulatory Visit (INDEPENDENT_AMBULATORY_CARE_PROVIDER_SITE_OTHER): Payer: Medicare HMO | Admitting: Family Medicine

## 2020-10-09 VITALS — BP 122/76 | HR 87 | Temp 97.6°F | Ht 63.0 in | Wt 259.0 lb

## 2020-10-09 DIAGNOSIS — M25561 Pain in right knee: Secondary | ICD-10-CM | POA: Diagnosis not present

## 2020-10-09 DIAGNOSIS — Z6841 Body Mass Index (BMI) 40.0 and over, adult: Secondary | ICD-10-CM | POA: Diagnosis not present

## 2020-10-09 DIAGNOSIS — R7303 Prediabetes: Secondary | ICD-10-CM | POA: Diagnosis not present

## 2020-10-10 NOTE — Progress Notes (Signed)
Chief Complaint:   OBESITY Kristi Hunter is here to discuss her progress with her obesity treatment plan along with follow-up of her obesity related diagnoses. Kristi Hunter is on the Category 2 Plan and states she is following her eating plan approximately 40% of the time. Kristi Hunter states she is doing pool exercises for 45 minutes 2-3 times per week.  Today's visit was #: 12 Starting weight: 267 lbs Starting date: 02/22/2020 Today's weight: 259 lbs Today's date: 10/09/2020 Total lbs lost to date: 8 Total lbs lost since last in-office visit: 0  Interim History: Colinda has gotten off track with her weight loss. She was on vacation and tried to be active. She would like to try shakes or pre-packaged foods sometimes to help her yet back on track.  Subjective:   1. Pre-diabetes Kristi Hunter frequently forgets to take her second dose, especially while traveling.  2. Right knee pain, unspecified chronicity Kristi Hunter is considering having her left knee replaced. She already had a right knee replacement. She is working on weight loss to help decrease pain.  Assessment/Plan:   1. Pre-diabetes Kristi Hunter will continue to manage her medications, and she agrees to work on taking both doses. She will continue to work on weight loss, diet, exercise, and decreasing simple carbohydrates to help decrease the risk of diabetes.   2. Right knee pain, unspecified chronicity Kristi Hunter is to continue diet, exercise, and weight loss. The goal is to get BMI to below 40.  3. Class 3 severe obesity with serious comorbidity and body mass index (BMI) of 45.0 to 49.9 in adult, unspecified obesity type Kristi Hunter) Kristi Hunter is currently in the action stage of change. As such, her goal is to continue with weight loss efforts. She has agreed to the Category 2 Plan or keeping a food journal and adhering to recommended goals of 1200-1300 calories and 80+ grams of protein daily.   Exercise goals: Upper body  exercises.  Behavioral modification strategies: increasing lean protein intake, decreasing simple carbohydrates and planning for success.  Kristi Hunter has agreed to follow-up with our clinic in 3 weeks. She was informed of the importance of frequent follow-up visits to maximize her success with intensive lifestyle modifications for her multiple health conditions.   Objective:   Blood pressure 122/76, pulse 87, temperature 97.6 F (36.4 C), height 5\' 3"  (1.6 m), weight 259 lb (117.5 kg), SpO2 97 %. Body mass index is 45.88 kg/m.  General: Cooperative, alert, well developed, in no acute distress. HEENT: Conjunctivae and lids unremarkable. Cardiovascular: Regular rhythm.  Lungs: Normal work of breathing. Neurologic: No focal deficits.   Lab Results  Component Value Date   CREATININE 0.65 04/18/2020   BUN 19 04/18/2020   NA 139 04/18/2020   K 3.4 (L) 04/18/2020   CL 99 04/18/2020   CO2 31 04/18/2020   Lab Results  Component Value Date   ALT 19 02/22/2020   AST 25 02/22/2020   ALKPHOS 85 02/22/2020   BILITOT 0.3 02/22/2020   Lab Results  Component Value Date   HGBA1C 5.9 (H) 02/22/2020   HGBA1C 5.9 05/10/2019   HGBA1C 6.0 01/13/2019   Lab Results  Component Value Date   INSULIN 21.5 02/22/2020   Lab Results  Component Value Date   TSH 1.470 02/22/2020   Lab Results  Component Value Date   CHOL 151 01/13/2019   HDL 44.90 01/13/2019   LDLCALC 92 01/13/2019   TRIG 72.0 01/13/2019   CHOLHDL 3 01/13/2019   Lab Results  Component Value  Date   WBC 8.1 02/22/2020   HGB 13.2 02/22/2020   HCT 41.3 02/22/2020   MCV 86 02/22/2020   PLT 332 02/22/2020   No results found for: IRON, TIBC, FERRITIN  Obesity Behavioral Intervention:   Approximately 15 minutes were spent on the discussion below.  ASK: We discussed the diagnosis of obesity with Kristi Hunter today and Kristi Hunter agreed to give [name] permission to discuss obesity behavioral modification therapy  today.  ASSESS: Kristi Hunter has the diagnosis of obesity and her BMI today is 45.89. Kristi Hunter is in the action stage of change.   ADVISE: Kristi Hunter was educated on the multiple health risks of obesity as well as the benefit of weight loss to improve her health. She was advised of the need for long term treatment and the importance of lifestyle modifications to improve her current health and to decrease her risk of future health problems.  AGREE: Multiple dietary modification options and treatment options were discussed and Kristi Hunter agreed to follow the recommendations documented in the above note.  ARRANGE: Kristi Hunter was educated on the importance of frequent visits to treat obesity as outlined per CMS and USPSTF guidelines and agreed to schedule her next follow up appointment today.  Attestation Statements:   Reviewed by clinician on day of visit: allergies, medications, problem list, medical history, surgical history, family history, social history, and previous encounter notes.   I, Trixie Dredge, am acting as transcriptionist for [name], MD.  I have reviewed the above documentation for accuracy and completeness, and I agree with the above. -  [name], MD

## 2020-10-16 ENCOUNTER — Other Ambulatory Visit (INDEPENDENT_AMBULATORY_CARE_PROVIDER_SITE_OTHER): Payer: Self-pay | Admitting: Family Medicine

## 2020-10-16 DIAGNOSIS — R7303 Prediabetes: Secondary | ICD-10-CM

## 2020-10-16 NOTE — Telephone Encounter (Signed)
Last OV with Dr. Beasley 

## 2020-10-18 ENCOUNTER — Telehealth: Payer: Self-pay

## 2020-10-18 NOTE — Progress Notes (Signed)
Chronic Care Management Pharmacy Assistant   Name: Kristi Hunter  MRN: [id number] DOB: 02-Sep-1940  Reason for Encounter:Hypertension Disease State Call   Conditions to be addressed/monitored: HTN  Primary concerns for visit include: Hypertension   Recent office visits:  No Recent Office Visits  Recent consult visits:  09/04/2020 [facility], increase metformin to 500 mg BID   10/09/2020 [facility] visits:  None in previous 6 months  Medications: Outpatient Encounter Medications as of 10/18/2020  Medication Sig  . alendronate (FOSAMAX) 70 MG tablet Take 1 tablet (70 mg total) by mouth every 7 (seven) days. Take with a full glass of water on an empty stomach.  Marland Kitchen aspirin 81 MG EC tablet Take 81 mg by mouth daily. Swallow whole.  . calcium-vitamin D (OSCAL WITH D) 500-200 MG-UNIT tablet Take 1 tablet by mouth 2 (two) times daily.  . chlorthalidone (HYGROTON) 25 MG tablet TAKE 1 TABLET BY MOUTH EVERY DAY  . GLUCOSAMINE-CHONDROITIN-VIT D3 PO Take 2,000 Units by mouth daily.  . Lactobacillus (PROBIOTIC ACIDOPHILUS PO) Take 1 capsule by mouth daily.  . Melatonin 10 MG TABS Take 10 mg by mouth at bedtime.  . metFORMIN (GLUCOPHAGE) 500 MG tablet Take 1 tablet (500 mg total) by mouth 2 (two) times daily with a meal.  . Multiple Vitamin (MULTIVITAMIN) tablet Take 1 tablet by mouth daily.  . naproxen (NAPROSYN) 500 MG tablet Take one twice daily as needed for pain with food.  Marland Kitchen omega-3 fish oil (MAXEPA) 1000 MG CAPS capsule Take 1 capsule by mouth daily.   . traZODone (DESYREL) 50 MG tablet   . Turmeric 500 MG CAPS Take 1 capsule by mouth daily.   No facility-administered encounter medications on file as of 10/18/2020.     Star Rating Drugs:Metformin ,  Reviewed chart prior to disease state call. Spoke with patient regarding BP  Recent Office Vitals: BP Readings from Last 3 Encounters:  10/09/20 122/76   09/04/20 132/60  08/15/20 132/84   Pulse Readings from Last 3 Encounters:  10/09/20 87  09/04/20 64  08/15/20 81    Wt Readings from Last 3 Encounters:  10/09/20 259 lb (117.5 kg)  09/04/20 258 lb (117 kg)  08/15/20 253 lb (114.8 kg)     Kidney Function Lab Results  Component Value Date/Time   CREATININE 0.65 04/18/2020 09:42 AM   CREATININE 0.57 02/22/2020 01:39 PM   GFR 88.45 04/18/2020 09:42 AM   GFRNONAA 90 02/22/2020 01:39 PM   GFRAA 104 02/22/2020 01:39 PM    BMP Latest Ref Rng & Units 04/18/2020 02/22/2020 10/12/2019  Glucose 70 - 99 mg/dL 89 85 98  BUN 6 - 23 mg/dL 19 18 19   Creatinine 0.40 - 1.20 mg/dL 0.65 0.57 0.64  BUN/Creat Ratio 12 - 28 - 32(H) -  Sodium 135 - 145 mEq/L 139 140 135  Potassium 3.5 - 5.1 mEq/L 3.4(L) 4.2 3.9  Chloride 96 - 112 mEq/L 99 101 99  CO2 19 - 32 mEq/L 31 28 30   Calcium 8.4 - 10.5 mg/dL 9.3 8.9 9.1    . Current antihypertensive regimen:  ? Chlorthalidone 25 mg daily  . How often are you checking your Blood Pressure? infrequently . Current home BP readings: None ID . What recent interventions/DTPs have been made by any provider to improve Blood Pressure control since last CPP Visit: None ID . Any recent hospitalizations or ED visits since last visit with CPP? No .  What diet changes have been made to improve Blood Pressure Control?  o Patient states there has not been any changes with her diet. . What exercise is being done to improve your Blood Pressure Control?  o Patient reports she is increasing her walking.   Performed cost analysis for patient, estimated yearly medication cost of $ 0.00.  Adherence Review: Is the patient currently on ACE/ARB medication? No Does the patient have >5 day gap between last estimated fill dates? No   Joycelyn Schmid Clinical Pharmacist Assistant [phone]

## 2020-10-24 DIAGNOSIS — G4733 Obstructive sleep apnea (adult) (pediatric): Secondary | ICD-10-CM | POA: Diagnosis not present

## 2020-10-26 DIAGNOSIS — G4733 Obstructive sleep apnea (adult) (pediatric): Secondary | ICD-10-CM | POA: Diagnosis not present

## 2020-10-27 DIAGNOSIS — R3915 Urgency of urination: Secondary | ICD-10-CM | POA: Diagnosis not present

## 2020-10-27 DIAGNOSIS — N3946 Mixed incontinence: Secondary | ICD-10-CM | POA: Diagnosis not present

## 2020-10-27 DIAGNOSIS — R350 Frequency of micturition: Secondary | ICD-10-CM | POA: Diagnosis not present

## 2020-10-30 ENCOUNTER — Other Ambulatory Visit: Payer: Self-pay

## 2020-10-30 ENCOUNTER — Encounter (INDEPENDENT_AMBULATORY_CARE_PROVIDER_SITE_OTHER): Payer: Self-pay | Admitting: Family Medicine

## 2020-10-30 ENCOUNTER — Ambulatory Visit (INDEPENDENT_AMBULATORY_CARE_PROVIDER_SITE_OTHER): Payer: Medicare HMO | Admitting: Family Medicine

## 2020-10-30 VITALS — BP 137/59 | HR 77 | Temp 97.3°F | Ht 63.0 in | Wt 259.0 lb

## 2020-10-30 DIAGNOSIS — R7303 Prediabetes: Secondary | ICD-10-CM

## 2020-10-30 DIAGNOSIS — Z6841 Body Mass Index (BMI) 40.0 and over, adult: Secondary | ICD-10-CM | POA: Diagnosis not present

## 2020-10-30 MED ORDER — METFORMIN HCL 500 MG PO TABS: 500.0000 mg | ORAL_TABLET | Freq: Two times a day (BID) | ORAL | 0 refills | Status: DC

## 2020-11-06 NOTE — Progress Notes (Signed)
Chief Complaint:   OBESITY Kristi Hunter is here to discuss her progress with her obesity treatment plan along with follow-up of her obesity related diagnoses.   Today's visit was #: 13 Starting weight: 267 lbs Starting date: 02/22/2020 Today's weight: 259 lbs Today's date: 10/30/2020 Total lbs lost to date: 8 lbs Body mass index is 45.88 kg/m.  Total weight loss percentage to date: -3.00%  Interim History:  For breakfast, she has bacon with eggs or just a yogurt and some berries.  For dinner, she says, "baked potatoes are hard to give up".  Also some proteins.  She has not been journaling at all lately.  She states she does much better when she does it regularly.  Plan:  Handout on protein content of foods and metformin information given.  Current Meal Plan: the Category 2 Plan and keeping a food journal and adhering to recommended goals of 1200-1300 calories and 80 grams of protein for 55% of the time.  Current Exercise Plan: Weights for 10-15 minutes 3 times per week.  Assessment/Plan:   No orders of the defined types were placed in this encounter.   Medications Discontinued During This Encounter  Medication Reason  . metFORMIN (GLUCOPHAGE) 500 MG tablet Reorder     Meds ordered this encounter  Medications  . metFORMIN (GLUCOPHAGE) 500 MG tablet    Sig: Take 1 tablet (500 mg total) by mouth 2 (two) times daily with a meal.    Dispense:  60 tablet    Refill:  0     1. Prediabetes Not at goal. Goal is HgbA1c < 5.7.  Medication: metformin 500 mg twice daily.    Plan:  She will continue to focus on protein-rich, low simple carbohydrate foods. We reviewed the importance of hydration, regular exercise for stress reduction, and restorative sleep.  Continue metformin.  Will refill today, as per below.  Lab Results  Component Value Date   HGBA1C 5.9 (H) 02/22/2020   Lab Results  Component Value Date   INSULIN 21.5 02/22/2020   - Refill metFORMIN (GLUCOPHAGE) 500 MG  tablet; Take 1 tablet (500 mg total) by mouth 2 (two) times daily with a meal.  Dispense: 60 tablet; Refill: 0  2. Obesity, current BMI 45.9  Course: Anjuli is currently in the action stage of change. As such, her goal is to continue with weight loss efforts.   Nutrition goals: She has agreed to the Category 2 Plan and keeping a food journal and adhering to recommended goals of 1200-1300 calories and 80 grams of protein.   Exercise goals: Older adults should follow the adult guidelines. When older adults cannot meet the adult guidelines, they should be as physically active as their abilities and conditions will allow.  Older adults should do exercises that maintain or improve balance if they are at risk of falling.   Behavioral modification strategies: increasing lean protein intake and decreasing simple carbohydrates.  [name] has agreed to follow-up with our clinic in 2-3 weeks. She was informed of the importance of frequent follow-up visits to maximize her success with intensive lifestyle modifications for her multiple health conditions.   Objective:   Blood pressure (!) 137/59, pulse 77, temperature (!) 97.3 F (36.3 C), height 5\' 3"  (1.6 m), weight 259 lb (117.5 kg), SpO2 97 %. Body mass index is 45.88 kg/m.  General: Cooperative, alert, well developed, in no acute distress. HEENT: Conjunctivae and lids unremarkable. Cardiovascular: Regular rhythm.  Lungs: Normal work of breathing. Neurologic: No focal  deficits.   Lab Results  Component Value Date   CREATININE 0.65 04/18/2020   BUN 19 04/18/2020   NA 139 04/18/2020   K 3.4 (L) 04/18/2020   CL 99 04/18/2020   CO2 31 04/18/2020   Lab Results  Component Value Date   ALT 19 02/22/2020   AST 25 02/22/2020   ALKPHOS 85 02/22/2020   BILITOT 0.3 02/22/2020   Lab Results  Component Value Date   HGBA1C 5.9 (H) 02/22/2020   HGBA1C 5.9 05/10/2019   HGBA1C 6.0 01/13/2019   Lab Results  Component Value Date   INSULIN  21.5 02/22/2020   Lab Results  Component Value Date   TSH 1.470 02/22/2020   Lab Results  Component Value Date   CHOL 151 01/13/2019   HDL 44.90 01/13/2019   LDLCALC 92 01/13/2019   TRIG 72.0 01/13/2019   CHOLHDL 3 01/13/2019   Lab Results  Component Value Date   WBC 8.1 02/22/2020   HGB 13.2 02/22/2020   HCT 41.3 02/22/2020   MCV 86 02/22/2020   PLT 332 02/22/2020   Obesity Behavioral Intervention:   Approximately 15 minutes were spent on the discussion below.  ASK: We discussed the diagnosis of obesity with Kristi Hunter today and Kristi Hunter agreed to give [name] permission to discuss obesity behavioral modification therapy today.  ASSESS: Kristi Hunter has the diagnosis of obesity and her BMI today is 45.9. Kristi Hunter is in the action stage of change.   ADVISE: Jaima was educated on the multiple health risks of obesity as well as the benefit of weight loss to improve her health. She was advised of the need for long term treatment and the importance of lifestyle modifications to improve her current health and to decrease her risk of future health problems.  AGREE: Multiple dietary modification options and treatment options were discussed and Kristi Hunter agreed to follow the recommendations documented in the above note.  ARRANGE: Kristi Hunter was educated on the importance of frequent visits to treat obesity as outlined per CMS and USPSTF guidelines and agreed to schedule her next follow up appointment today.  Attestation Statements:   Reviewed by clinician on day of visit: allergies, medications, problem list, medical history, surgical history, family history, social history, and previous encounter notes.  I, Water quality scientist, CMA, am acting as Location manager for [employer], DO.  I have reviewed the above documentation for accuracy and completeness, and I agree with the above. Marjory Sneddon, D.O.  The [place] was signed into law in 2016 which includes the  topic of electronic health records.  This provides immediate access to information in MyChart.  This includes consultation notes, operative notes, office notes, lab results and pathology reports.  If you have any questions about what you read please let us know at your next visit so we can discuss your concerns and take corrective action if need be.  We are right here with you.

## 2020-11-17 DIAGNOSIS — R350 Frequency of micturition: Secondary | ICD-10-CM | POA: Diagnosis not present

## 2020-11-17 DIAGNOSIS — R3915 Urgency of urination: Secondary | ICD-10-CM | POA: Diagnosis not present

## 2020-11-20 ENCOUNTER — Ambulatory Visit (INDEPENDENT_AMBULATORY_CARE_PROVIDER_SITE_OTHER): Payer: Medicare HMO | Admitting: Family Medicine

## 2020-11-20 ENCOUNTER — Other Ambulatory Visit: Payer: Self-pay

## 2020-11-20 ENCOUNTER — Encounter (INDEPENDENT_AMBULATORY_CARE_PROVIDER_SITE_OTHER): Payer: Self-pay | Admitting: Family Medicine

## 2020-11-20 VITALS — BP 125/74 | HR 74 | Temp 97.6°F | Ht 63.0 in | Wt 258.0 lb

## 2020-11-20 DIAGNOSIS — Z6841 Body Mass Index (BMI) 40.0 and over, adult: Secondary | ICD-10-CM

## 2020-11-20 DIAGNOSIS — F3289 Other specified depressive episodes: Secondary | ICD-10-CM

## 2020-11-20 DIAGNOSIS — R7303 Prediabetes: Secondary | ICD-10-CM

## 2020-11-20 DIAGNOSIS — R69 Illness, unspecified: Secondary | ICD-10-CM | POA: Diagnosis not present

## 2020-11-20 MED ORDER — BUPROPION HCL ER (SR) 150 MG PO TB12
150.0000 mg | ORAL_TABLET | Freq: Every morning | ORAL | 0 refills | Status: DC
Start: 2020-11-20 — End: 2020-12-18

## 2020-11-20 MED ORDER — METFORMIN HCL 500 MG PO TABS: 500.0000 mg | ORAL_TABLET | Freq: Two times a day (BID) | ORAL | 0 refills | Status: DC

## 2020-11-23 ENCOUNTER — Other Ambulatory Visit: Payer: Self-pay | Admitting: Family Medicine

## 2020-11-23 DIAGNOSIS — Z1231 Encounter for screening mammogram for malignant neoplasm of breast: Secondary | ICD-10-CM

## 2020-11-24 DIAGNOSIS — G4733 Obstructive sleep apnea (adult) (pediatric): Secondary | ICD-10-CM | POA: Diagnosis not present

## 2020-11-28 NOTE — Progress Notes (Signed)
Chief Complaint:   OBESITY Lynett is here to discuss her progress with her obesity treatment plan along with follow-up of her obesity related diagnoses. Seena is on the Category 2 Plan or keeping a food journal and adhering to recommended goals of 1200-1300 calories and 80 grams of protein daily and states she is following her eating plan approximately 35% of the time. Vincenzina states she is doing 0 minutes 0 times per week.  Today's visit was #: 14 Starting weight: 267 lbs Starting date: 02/22/2020 Today's weight: 258 lbs Today's date: 11/20/2020 Total lbs lost to date: 9 Total lbs lost since last in-office visit: 1  Interim History: Jeyda continues to lose weight at a slow but steady pace. She is struggling with cravings and increased temptations especially for sweets. She feels her hunger is not an issue.  Subjective:   1. Pre-diabetes Tarita is working on diet, but she is struggling with increased simple carbohydrates.  2. Other depression with emotional eating Kaile notes increased emotional eating and cravings, worse in the last few weeks.  Assessment/Plan:   1. Pre-diabetes Lateshia will continue to work on weight loss, exercise, and decreasing simple carbohydrates to help decrease the risk of diabetes. We will refill metformin for 1 month.  - metFORMIN (GLUCOPHAGE) 500 MG tablet; Take 1 tablet (500 mg total) by mouth 2 (two) times daily with a meal.  Dispense: 60 tablet; Refill: 0  2. Other depression with emotional eating Behavior modification techniques were discussed today to help Lorenza deal with her emotional/non-hunger eating behaviors.  Lian agreed to start Wellbutrin SR 150 mg q AM with no refills. Orders and follow up as documented in patient record.   - buPROPion (WELLBUTRIN SR) 150 MG 12 hr tablet; Take 1 tablet (150 mg total) by mouth in the morning.  Dispense: 30 tablet; Refill: 0  3. Obesity, current BMI 45.9 Jeannetta is  currently in the action stage of change. As such, her goal is to continue with weight loss efforts. She has agreed to the Category 2 Plan.   Behavioral modification strategies: increasing water intake and emotional eating strategies.  Juna has agreed to follow-up with our clinic in 3 to 4 weeks. She was informed of the importance of frequent follow-up visits to maximize her success with intensive lifestyle modifications for her multiple health conditions.   Objective:   Blood pressure 125/74, pulse 74, temperature 97.6 F (36.4 C), height 5\' 3"  (1.6 m), weight 258 lb (117 kg), SpO2 95 %. Body mass index is 45.7 kg/m.  General: Cooperative, alert, well developed, in no acute distress. HEENT: Conjunctivae and lids unremarkable. Cardiovascular: Regular rhythm.  Lungs: Normal work of breathing. Neurologic: No focal deficits.   Lab Results  Component Value Date   CREATININE 0.65 04/18/2020   BUN 19 04/18/2020   NA 139 04/18/2020   K 3.4 (L) 04/18/2020   CL 99 04/18/2020   CO2 31 04/18/2020   Lab Results  Component Value Date   ALT 19 02/22/2020   AST 25 02/22/2020   ALKPHOS 85 02/22/2020   BILITOT 0.3 02/22/2020   Lab Results  Component Value Date   HGBA1C 5.9 (H) 02/22/2020   HGBA1C 5.9 05/10/2019   HGBA1C 6.0 01/13/2019   Lab Results  Component Value Date   INSULIN 21.5 02/22/2020   Lab Results  Component Value Date   TSH 1.470 02/22/2020   Lab Results  Component Value Date   CHOL 151 01/13/2019   HDL 44.90 01/13/2019  LDLCALC 92 01/13/2019   TRIG 72.0 01/13/2019   CHOLHDL 3 01/13/2019   Lab Results  Component Value Date   WBC 8.1 02/22/2020   HGB 13.2 02/22/2020   HCT 41.3 02/22/2020   MCV 86 02/22/2020   PLT 332 02/22/2020   No results found for: IRON, TIBC, FERRITIN  Obesity Behavioral Intervention:   Approximately 15 minutes were spent on the discussion below.  ASK: We discussed the diagnosis of obesity with [name] today and Zyia  agreed to give [name] permission to discuss obesity behavioral modification therapy today.  ASSESS: Tinesha has the diagnosis of obesity and her BMI today is 45.71. Sanika is in the action stage of change.   ADVISE: Zowie was educated on the multiple health risks of obesity as well as the benefit of weight loss to improve her health. She was advised of the need for long term treatment and the importance of lifestyle modifications to improve her current health and to decrease her risk of future health problems.  AGREE: Multiple dietary modification options and treatment options were discussed and Siona agreed to follow the recommendations documented in the above note.  ARRANGE: Tajuana was educated on the importance of frequent visits to treat obesity as outlined per CMS and USPSTF guidelines and agreed to schedule her next follow up appointment today.  Attestation Statements:   Reviewed by clinician on day of visit: allergies, medications, problem list, medical history, surgical history, family history, social history, and previous encounter notes.   I, Trixie Dredge, am acting as transcriptionist for [name], MD.  I have reviewed the above documentation for accuracy and completeness, and I agree with the above. -  [name], MD

## 2020-12-07 DIAGNOSIS — R350 Frequency of micturition: Secondary | ICD-10-CM | POA: Diagnosis not present

## 2020-12-11 ENCOUNTER — Telehealth: Payer: Self-pay | Admitting: Family Medicine

## 2020-12-11 NOTE — Telephone Encounter (Signed)
Joelene Millin with [facility] about forms they faxed over for provider to sign and just requested callback at [phone]

## 2020-12-18 ENCOUNTER — Other Ambulatory Visit: Payer: Self-pay

## 2020-12-18 ENCOUNTER — Ambulatory Visit (INDEPENDENT_AMBULATORY_CARE_PROVIDER_SITE_OTHER): Payer: Medicare HMO | Admitting: Family Medicine

## 2020-12-18 ENCOUNTER — Encounter (INDEPENDENT_AMBULATORY_CARE_PROVIDER_SITE_OTHER): Payer: Self-pay | Admitting: Family Medicine

## 2020-12-18 VITALS — BP 128/82 | HR 72 | Temp 97.6°F | Ht 63.0 in | Wt 260.0 lb

## 2020-12-18 DIAGNOSIS — Z6841 Body Mass Index (BMI) 40.0 and over, adult: Secondary | ICD-10-CM

## 2020-12-18 DIAGNOSIS — R69 Illness, unspecified: Secondary | ICD-10-CM | POA: Diagnosis not present

## 2020-12-18 DIAGNOSIS — F3289 Other specified depressive episodes: Secondary | ICD-10-CM | POA: Diagnosis not present

## 2020-12-18 DIAGNOSIS — R7303 Prediabetes: Secondary | ICD-10-CM

## 2020-12-19 ENCOUNTER — Other Ambulatory Visit (INDEPENDENT_AMBULATORY_CARE_PROVIDER_SITE_OTHER): Payer: Self-pay | Admitting: Family Medicine

## 2020-12-19 DIAGNOSIS — F3289 Other specified depressive episodes: Secondary | ICD-10-CM

## 2020-12-19 MED ORDER — BUPROPION HCL ER (SR) 150 MG PO TB12: 150.0000 mg | ORAL_TABLET | Freq: Every morning | ORAL | 0 refills | Status: DC

## 2020-12-19 MED ORDER — METFORMIN HCL 500 MG PO TABS: 500.0000 mg | ORAL_TABLET | Freq: Two times a day (BID) | ORAL | 0 refills | Status: DC

## 2020-12-19 NOTE — Progress Notes (Signed)
Chief Complaint:   OBESITY Kristi Hunter is here to discuss her progress with her obesity treatment plan along with follow-up of her obesity related diagnoses. Kristi Hunter is on the Category 2 Plan and states she is following her eating plan approximately 30% of the time. Kristi Hunter states she is doing 0 minutes 0 times per week.  Today's visit was #: 15 Starting weight: 267 lbs Starting date: 02/22/2020 Today's weight: 260 lbs Today's date: 12/18/2020 Total lbs lost to date: 7 Total lbs lost since last in-office visit: 0  Interim History: Kristi Hunter has been traveling in the last month. She did some celebration eating, but she is ready to get back on track. She is thinking about going back to the [facility].  Subjective:   1. Pre-diabetes Kristi Hunter is stable on her medications. She continues to work on her diet and weight loss. She denies signs of hypoglycemia.  2. Other depression with emotional eating Kristi Hunter is doing well on her medications. She notes less boredom eating, and her blood pressure is stable.  Assessment/Plan:   1. Pre-diabetes Kristi Hunter will continue to work on weight loss, exercise, and decreasing simple carbohydrates to help decrease the risk of diabetes. We will refill metformin for 1 month.  - metFORMIN (GLUCOPHAGE) 500 MG tablet; Take 1 tablet (500 mg total) by mouth 2 (two) times daily with a meal.  Dispense: 60 tablet; Refill: 0  2. Other depression with emotional eating Behavior modification techniques were discussed today to help Kristi Hunter deal with her emotional/non-hunger eating behaviors. We will refill Wellbutrin SR for 1 month. Orders and follow up as documented in patient record.   - buPROPion (WELLBUTRIN SR) 150 MG 12 hr tablet; Take 1 tablet (150 mg total) by mouth in the morning.  Dispense: 30 tablet; Refill: 0  3. Obesity with current BMI 46.1 Kristi Hunter is currently in the action stage of change. As such, her goal is to continue with weight loss  efforts. She has agreed to the Category 2 Plan.   Behavioral modification strategies: increasing lean protein intake and meal planning and cooking strategies.  Kristi Hunter has agreed to follow-up with our clinic in 4 weeks. She was informed of the importance of frequent follow-up visits to maximize her success with intensive lifestyle modifications for her multiple health conditions.   Objective:   Blood pressure 128/82, pulse 72, temperature 97.6 F (36.4 C), height 5\' 3"  (1.6 m), weight 260 lb (117.9 kg), SpO2 95 %. Body mass index is 46.06 kg/m.  General: Cooperative, alert, well developed, in no acute distress. HEENT: Conjunctivae and lids unremarkable. Cardiovascular: Regular rhythm.  Lungs: Normal work of breathing. Neurologic: No focal deficits.   Lab Results  Component Value Date   CREATININE 0.65 04/18/2020   BUN 19 04/18/2020   NA 139 04/18/2020   K 3.4 (L) 04/18/2020   CL 99 04/18/2020   CO2 31 04/18/2020   Lab Results  Component Value Date   ALT 19 02/22/2020   AST 25 02/22/2020   ALKPHOS 85 02/22/2020   BILITOT 0.3 02/22/2020   Lab Results  Component Value Date   HGBA1C 5.9 (H) 02/22/2020   HGBA1C 5.9 05/10/2019   HGBA1C 6.0 01/13/2019   Lab Results  Component Value Date   INSULIN 21.5 02/22/2020   Lab Results  Component Value Date   TSH 1.470 02/22/2020   Lab Results  Component Value Date   CHOL 151 01/13/2019   HDL 44.90 01/13/2019   LDLCALC 92 01/13/2019   TRIG 72.0 01/13/2019  CHOLHDL 3 01/13/2019   Lab Results  Component Value Date   WBC 8.1 02/22/2020   HGB 13.2 02/22/2020   HCT 41.3 02/22/2020   MCV 86 02/22/2020   PLT 332 02/22/2020   No results found for: IRON, TIBC, FERRITIN  Obesity Behavioral Intervention:   Approximately 15 minutes were spent on the discussion below.  ASK: We discussed the diagnosis of obesity with Kristi Hunter today and Kristi Hunter agreed to give [name] permission to discuss obesity behavioral modification  therapy today.  ASSESS: Kristi Hunter has the diagnosis of obesity and her BMI today is 46.07. Kristi Hunter is in the action stage of change.   ADVISE: Kristi Hunter was educated on the multiple health risks of obesity as well as the benefit of weight loss to improve her health. She was advised of the need for long term treatment and the importance of lifestyle modifications to improve her current health and to decrease her risk of future health problems.  AGREE: Multiple dietary modification options and treatment options were discussed and Kristi Hunter agreed to follow the recommendations documented in the above note.  ARRANGE: Kristi Hunter was educated on the importance of frequent visits to treat obesity as outlined per CMS and USPSTF guidelines and agreed to schedule her next follow up appointment today.  Attestation Statements:   Reviewed by clinician on day of visit: allergies, medications, problem list, medical history, surgical history, family history, social history, and previous encounter notes.   I, Kristi Hunter, am acting as transcriptionist for [name], MD.  I have reviewed the above documentation for accuracy and completeness, and I agree with the above. -  [name], MD

## 2020-12-19 NOTE — Telephone Encounter (Signed)
Pt last seen by Dr. Beasley.  

## 2020-12-20 ENCOUNTER — Telehealth: Payer: Self-pay

## 2020-12-20 NOTE — Progress Notes (Signed)
Chronic Care Management Pharmacy Assistant   Name: Tymesha Ditmore  MRN: [id number] DOB: 04-03-1941  Reason for [facility] Call.   Recent office visits:  No recent office visit  Recent consult visits:  12/18/2020 Dr. Leafy Ro MD (Family Medicine)  11/20/2020 Dr. Leafy Ro MD (Family Medicine) start  buPROPion [facility] SR) 150 MG 12 hr tablet; Take 1 tablet (150 mg total) by mouth in the morning 10/30/2020 Mellody Dance DO (Family Medicine)  09/04/2020 Dr. Leafy Ro MD (Family Medicine) increase metformin to 500 mg BID Hospital visits:  None in previous 6 months  Medications: Outpatient Encounter Medications as of 12/20/2020  Medication Sig  . alendronate (FOSAMAX) 70 MG tablet Take 1 tablet (70 mg total) by mouth every 7 (seven) days. Take with a full glass of water on an empty stomach.  Marland Kitchen aspirin 81 MG EC tablet Take 81 mg by mouth daily. Swallow whole.  Marland Kitchen buPROPion (WELLBUTRIN SR) 150 MG 12 hr tablet Take 1 tablet (150 mg total) by mouth in the morning.  . calcium-vitamin D (OSCAL WITH D) 500-200 MG-UNIT tablet Take 1 tablet by mouth 2 (two) times daily.  . chlorthalidone (HYGROTON) 25 MG tablet TAKE 1 TABLET BY MOUTH EVERY DAY  . GLUCOSAMINE-CHONDROITIN-VIT D3 PO Take 2,000 Units by mouth daily.  . Lactobacillus (PROBIOTIC ACIDOPHILUS PO) Take 1 capsule by mouth daily.  . Melatonin 10 MG TABS Take 10 mg by mouth at bedtime.  . metFORMIN (GLUCOPHAGE) 500 MG tablet Take 1 tablet (500 mg total) by mouth 2 (two) times daily with a meal.  . Multiple Vitamin (MULTIVITAMIN) tablet Take 1 tablet by mouth daily.  . naproxen (NAPROSYN) 500 MG tablet Take one twice daily as needed for pain with food.  Marland Kitchen omega-3 fish oil (MAXEPA) 1000 MG CAPS capsule Take 1 capsule by mouth daily.   . traZODone (DESYREL) 50 MG tablet   . Turmeric 500 MG CAPS Take 1 capsule by mouth daily.   No facility-administered encounter medications on file as of 12/20/2020.   Star  Rating Drugs: Metformin 500 mg last filled on 12/04/2020 for 30 day supply and on 11/11/2020 for 30 day supply at [facility].  Reviewed chart prior to disease state call. Spoke with patient regarding BP  Recent Office Vitals: BP Readings from Last 3 Encounters:  12/18/20 128/82  11/20/20 125/74  10/30/20 (!) 137/59   Pulse Readings from Last 3 Encounters:  12/18/20 72  11/20/20 74  10/30/20 77    Wt Readings from Last 3 Encounters:  12/18/20 260 lb (117.9 kg)  11/20/20 258 lb (117 kg)  10/30/20 259 lb (117.5 kg)     Kidney Function Lab Results  Component Value Date/Time   CREATININE 0.65 04/18/2020 09:42 AM   CREATININE 0.57 02/22/2020 01:39 PM   GFR 88.45 04/18/2020 09:42 AM   GFRNONAA 90 02/22/2020 01:39 PM   GFRAA 104 02/22/2020 01:39 PM    BMP Latest Ref Rng & Units 04/18/2020 02/22/2020 10/12/2019  Glucose 70 - 99 mg/dL 89 85 98  BUN 6 - 23 mg/dL 19 18 19   Creatinine 0.40 - 1.20 mg/dL 0.65 0.57 0.64  BUN/Creat Ratio 12 - 28 - 32(H) -  Sodium 135 - 145 mEq/L 139 140 135  Potassium 3.5 - 5.1 mEq/L 3.4(L) 4.2 3.9  Chloride 96 - 112 mEq/L 99 101 99  CO2 19 - 32 mEq/L 31 28 30   Calcium 8.4 - 10.5 mg/dL 9.3 8.9 9.1    . Current antihypertensive regimen:  ? Chlorthalidone 25  mg daily  . How often are you checking your Blood Pressure? Patient states she goes to the doctor every other week and has her blood check then. . Current home BP readings:  o Patient states her blood pressure was 128/82. . What recent interventions/DTPs have been made by any provider to improve Blood Pressure control since last CPP Visit: None ID . Any recent hospitalizations or ED visits since last visit with CPP? No . What diet changes have been made to improve Blood Pressure Control?  o None  . What exercise is being done to improve your Blood Pressure Control?  o None ID  Adherence Review: Is the patient currently on ACE/ARB medication? No Does the patient have >5 day gap between last  estimated fill dates? No   Anderson Malta Clinical Production designer, theatre/television/film [phone]

## 2020-12-24 DIAGNOSIS — G4733 Obstructive sleep apnea (adult) (pediatric): Secondary | ICD-10-CM | POA: Diagnosis not present

## 2020-12-25 ENCOUNTER — Other Ambulatory Visit: Payer: Self-pay | Admitting: Nurse Practitioner

## 2020-12-25 DIAGNOSIS — L719 Rosacea, unspecified: Secondary | ICD-10-CM

## 2020-12-26 DIAGNOSIS — R3915 Urgency of urination: Secondary | ICD-10-CM | POA: Diagnosis not present

## 2020-12-26 DIAGNOSIS — N3946 Mixed incontinence: Secondary | ICD-10-CM | POA: Diagnosis not present

## 2020-12-26 DIAGNOSIS — R350 Frequency of micturition: Secondary | ICD-10-CM | POA: Diagnosis not present

## 2020-12-26 NOTE — Telephone Encounter (Signed)
Forms faxed conformation received.

## 2020-12-27 ENCOUNTER — Telehealth: Payer: Self-pay | Admitting: Family Medicine

## 2020-12-27 DIAGNOSIS — G4733 Obstructive sleep apnea (adult) (pediatric): Secondary | ICD-10-CM | POA: Diagnosis not present

## 2020-12-27 NOTE — Telephone Encounter (Signed)
Pt called upset because she said the pharmacy said that metroNIDAZOLE 0.75 % was denied, I let her know it wasn't sent to Dr [name] but I would send it to him

## 2020-12-28 NOTE — Telephone Encounter (Signed)
I have not written this for her. Denied. Needs an appointment.

## 2021-01-02 DIAGNOSIS — H04123 Dry eye syndrome of bilateral lacrimal glands: Secondary | ICD-10-CM | POA: Diagnosis not present

## 2021-01-02 DIAGNOSIS — D3131 Benign neoplasm of right choroid: Secondary | ICD-10-CM | POA: Diagnosis not present

## 2021-01-02 DIAGNOSIS — H401131 Primary open-angle glaucoma, bilateral, mild stage: Secondary | ICD-10-CM | POA: Diagnosis not present

## 2021-01-02 DIAGNOSIS — Z961 Presence of intraocular lens: Secondary | ICD-10-CM | POA: Diagnosis not present

## 2021-01-12 ENCOUNTER — Ambulatory Visit
Admission: RE | Admit: 2021-01-12 | Discharge: 2021-01-12 | Disposition: A | Discharge status: Home / Self Care | Payer: Medicare HMO | Source: Ambulatory Visit | Attending: Family Medicine | Admitting: Family Medicine

## 2021-01-12 ENCOUNTER — Other Ambulatory Visit: Payer: Self-pay

## 2021-01-12 DIAGNOSIS — Z1231 Encounter for screening mammogram for malignant neoplasm of breast: Secondary | ICD-10-CM | POA: Diagnosis not present

## 2021-01-17 ENCOUNTER — Other Ambulatory Visit: Payer: Self-pay | Admitting: Family Medicine

## 2021-01-17 DIAGNOSIS — R928 Other abnormal and inconclusive findings on diagnostic imaging of breast: Secondary | ICD-10-CM

## 2021-01-18 ENCOUNTER — Encounter (INDEPENDENT_AMBULATORY_CARE_PROVIDER_SITE_OTHER): Payer: Self-pay | Admitting: Family Medicine

## 2021-01-18 ENCOUNTER — Other Ambulatory Visit: Payer: Self-pay

## 2021-01-18 ENCOUNTER — Ambulatory Visit (INDEPENDENT_AMBULATORY_CARE_PROVIDER_SITE_OTHER): Payer: Medicare HMO | Admitting: Family Medicine

## 2021-01-18 ENCOUNTER — Other Ambulatory Visit (INDEPENDENT_AMBULATORY_CARE_PROVIDER_SITE_OTHER): Payer: Self-pay | Admitting: Family Medicine

## 2021-01-18 VITALS — BP 125/76 | HR 78 | Temp 97.7°F | Ht 63.0 in | Wt 254.0 lb

## 2021-01-18 DIAGNOSIS — R7303 Prediabetes: Secondary | ICD-10-CM

## 2021-01-18 DIAGNOSIS — F3289 Other specified depressive episodes: Secondary | ICD-10-CM | POA: Diagnosis not present

## 2021-01-18 DIAGNOSIS — Z6841 Body Mass Index (BMI) 40.0 and over, adult: Secondary | ICD-10-CM

## 2021-01-18 DIAGNOSIS — R69 Illness, unspecified: Secondary | ICD-10-CM | POA: Diagnosis not present

## 2021-01-18 MED ORDER — METFORMIN HCL 500 MG PO TABS: 500.0000 mg | ORAL_TABLET | Freq: Two times a day (BID) | ORAL | 0 refills | Status: DC

## 2021-01-18 MED ORDER — BUPROPION HCL ER (SR) 150 MG PO TB12: 150.0000 mg | ORAL_TABLET | Freq: Every morning | ORAL | 0 refills | Status: DC

## 2021-01-19 DIAGNOSIS — R3915 Urgency of urination: Secondary | ICD-10-CM | POA: Diagnosis not present

## 2021-01-19 DIAGNOSIS — R350 Frequency of micturition: Secondary | ICD-10-CM | POA: Diagnosis not present

## 2021-01-19 DIAGNOSIS — N3946 Mixed incontinence: Secondary | ICD-10-CM | POA: Diagnosis not present

## 2021-01-22 NOTE — Telephone Encounter (Signed)
Dr.Beasley 

## 2021-01-24 DIAGNOSIS — G4733 Obstructive sleep apnea (adult) (pediatric): Secondary | ICD-10-CM | POA: Diagnosis not present

## 2021-01-24 NOTE — Progress Notes (Signed)
I, Kristi Hunter, LAT, ATC, am serving as scribe for Dr. Lynne Hunter.  Kristi Hunter is a 80 y.o. female who presents to [facility] today for f/u of L knee pain.  She was last seen by Kristi Hunter on 01/20/20 for L knee pain and L leg swelling and was referred to lymphedema specialist at [facility] per pt's request.  She has since been seen by Kristi Hunter x 3 in Oct/Nov 2021.  Today, pt reports that  her L knee has been hurting for about one year.  She states that the gel injections that she had in the fall did help but not tremendously.  She is having pain w/ walking and moreso w/ transitioning from sit-to-stand.  She is feeling like her L knee is similar to how her R knee felt prior to her R knee TKR in 2012/13 and wants to know if she can get by with more gel injections or if it's time to see ortho.  Diagnostic imaging: L knee XR- 06/07/19  Pertinent review of systems: No fevers or chills  Relevant historical information: Morbid obesity managed by Kristi Hunter.  History of right TKR   Exam:  BP 120/62 (BP Location: Left Arm, Patient Position: Sitting, Cuff Size: Large)   Pulse 86   Ht 5\' 3"  (1.6 m)   Wt 261 lb 6.4 oz (118.6 kg)   SpO2 96%   BMI 46.30 kg/m  General: Well Developed, well nourished, and in no acute distress.   MSK: Left knee normal-appearing normal motion with crepitation.  Stable ligamentous exam.    Lab and Radiology Results  Procedure: Real-time Ultrasound Guided Injection of left knee superior lateral patellar space Device: Philips Affiniti 50G Images permanently stored and available for review in PACS Verbal informed consent obtained.  Discussed risks and benefits of procedure. Warned about infection bleeding damage to structures skin hypopigmentation and fat atrophy among others. Patient expresses understanding and agreement Time-out conducted.   Noted no overlying erythema, induration, or other signs of local  infection.   Skin prepped in a sterile fashion.   Local anesthesia: Topical Ethyl chloride.   With sterile technique and under real time ultrasound guidance: 40 mg of Kenalog and 2 mL of Marcaine injected into knee joint. Fluid seen entering the joint capsule.   Completed without difficulty   Pain immediately resolved suggesting accurate placement of the medication.   Advised to call if fevers/chills, erythema, induration, drainage, or persistent bleeding.   Images permanently stored and available for review in the ultrasound unit.  Impression: Technically successful ultrasound guided injection.   X-ray images left knee obtained today personally and independently interpreted Mild to moderate medial lateral DJD.  Severe patellofemoral DJD.  No acute fractures. Await formal radiology review     Assessment and Plan: 80 y.o. female with left knee pain due to worsening DJD.  Plan for steroid injection today.  We will additionally will work on authorization for hyaluronic acid injections.  She had some success with this back in November.  I am concerned as Kristi Hunter fundamentally is worsening.  Ultimately she will likely require total knee replacement before too long.  Currently her BMI is 46 which will preclude a total knee replacement.  The medium to long term goals should be to work on right weight loss to try to get BMI less than 40 so that she can have a knee replacement in the future when she needs one.  We will discuss  with Kristi Hunter strategies and options.  This will probably be around a 30-35 pound weight loss which may be very challenging.  She may require bariatric surgery.  Additionally quad strengthening will definitely be helpful.  Will refer to aquatic physical therapy as this may be helpful for managing knee pain and to improve quad strength.  Recheck for hyaluronic acid injections when knee pain resumes.   PDMP not reviewed this encounter. Orders Placed This Encounter   Procedures   [name] LIMITED JOINT SPACE STRUCTURES LOW LEFT(NO LINKED CHARGES)    Order Specific Question:   Reason for Exam (SYMPTOM  OR DIAGNOSIS REQUIRED)    Answer:   L knee pain    Order Specific Question:   Preferred imaging location?    Answer:   [place]   DG Knee AP/LAT W/Sun[place] Left    Standing Status:   Future    Number of Occurrences:   1    Standing Expiration Date:   02/24/2021    Order Specific Question:   Reason for Exam (SYMPTOM  OR DIAGNOSIS REQUIRED)    Answer:   L knee pain    Order Specific Question:   Preferred imaging location?    Answer:   [facility]   Ambulatory referral to Physical Therapy    Referral Priority:   Routine    Referral Type:   Physical Medicine    Referral Reason:   Specialty Services Required    Requested Specialty:   Physical Therapy    Number of Visits Requested:   1   No orders of the defined types were placed in this encounter.    Discussed warning signs or symptoms. Please see discharge instructions. Patient expresses understanding.   The above documentation has been reviewed and is accurate and complete Kristi Hunter, M.D.   Total encounter time 40 minutes including face-to-face time with the patient and, reviewing past medical record, and charting on the date of service.   Discussion of treatment options management strategies and weight loss strategies.  Time-based coding it is independent of time to perform injection.

## 2021-01-25 ENCOUNTER — Other Ambulatory Visit: Payer: Self-pay

## 2021-01-25 ENCOUNTER — Ambulatory Visit (INDEPENDENT_AMBULATORY_CARE_PROVIDER_SITE_OTHER): Payer: Medicare HMO

## 2021-01-25 ENCOUNTER — Ambulatory Visit: Payer: Medicare HMO | Admitting: Family Medicine

## 2021-01-25 ENCOUNTER — Encounter: Payer: Self-pay | Admitting: Family Medicine

## 2021-01-25 ENCOUNTER — Ambulatory Visit: Payer: Self-pay

## 2021-01-25 VITALS — BP 120/62 | HR 86 | Ht 63.0 in | Wt 261.4 lb

## 2021-01-25 DIAGNOSIS — M1712 Unilateral primary osteoarthritis, left knee: Secondary | ICD-10-CM | POA: Diagnosis not present

## 2021-01-25 DIAGNOSIS — M25562 Pain in left knee: Secondary | ICD-10-CM | POA: Diagnosis not present

## 2021-01-25 DIAGNOSIS — G8929 Other chronic pain: Secondary | ICD-10-CM

## 2021-01-25 NOTE — Patient Instructions (Addendum)
Thank you for coming in today.   You had a L knee injection today.  Call or go to the ER if you develop a large red swollen joint with extreme pain or oozing puss.   I will work on authorization for the gel shots.   I've referred you to Physical Therapy.  Let us know if you don't hear from them in one week.   Work on weight loss.  Your weight needs to be under 225 or so to have the knee replacement. I will send a message to Dr Leafy Ro.   We will contact you once we get authorization for the gel shots.   Let me know if you do not hear anything in 1 week.

## 2021-01-25 NOTE — Progress Notes (Signed)
Chief Complaint:   OBESITY Milessa is here to discuss her progress with her obesity treatment plan along with follow-up of her obesity related diagnoses. Jenipher is on the Category 2 Plan and states she is following her eating plan approximately 60% of the time. [employer] states she is doing 0 minutes 0 times per week.  Today's visit was #: 16 Starting weight: 267 lbs Starting date: 02/22/2020 Today's weight: 254 lbs Today's date: 01/18/2021 Total lbs lost to date: 13 Total lbs lost since last in-office visit: 6  Interim History: Zane continues to do well with weight loss. She is trying to lose weight so she can have a knee replacement surgery. She is trying to portion control and make smarter choices. Her pool is now open so she is trying to stay active that way.  Subjective:   1. Pre-diabetes Lakeyta's metformin increased to BID and she is working on diet, and her weight loss has improved.  2. Other depression with emotional eating [name] is doing well on her medications, and her mood appears stable.  Assessment/Plan:   1. Pre-diabetes Ilaisaane will continue to work on weight loss, diet, exercise, and decreasing simple carbohydrates to help decrease the risk of diabetes. We will refill metformin for 1 month.  - metFORMIN (GLUCOPHAGE) 500 MG tablet; Take 1 tablet (500 mg total) by mouth 2 (two) times daily with a meal.  Dispense: 60 tablet; Refill: 0  2. Other depression with emotional eating Behavior modification techniques were discussed today to help Tonimarie deal with her emotional/non-hunger eating behaviors. We will refill Wellbutrin SR 150 mg q daily #30 for 1 month. Orders and follow up as documented in patient record.   3. Obesity with current BMI 45.0 Hazelee is currently in the action stage of change. As such, her goal is to continue with weight loss efforts. She has agreed to the Category 2 Plan.   Exercise goals: As is.  Behavioral  modification strategies: increasing lean protein intake.  Yazleen has agreed to follow-up with our clinic in 4 weeks. She was informed of the importance of frequent follow-up visits to maximize her success with intensive lifestyle modifications for her multiple health conditions.   Objective:   Blood pressure 125/76, pulse 78, temperature 97.7 F (36.5 C), height 5\' 3"  (1.6 m), weight 254 lb (115.2 kg), SpO2 96 %. Body mass index is 44.99 kg/m.  General: Cooperative, alert, well developed, in no acute distress. HEENT: Conjunctivae and lids unremarkable. Cardiovascular: Regular rhythm.  Lungs: Normal work of breathing. Neurologic: No focal deficits.   Lab Results  Component Value Date   CREATININE 0.65 04/18/2020   BUN 19 04/18/2020   NA 139 04/18/2020   K 3.4 (L) 04/18/2020   CL 99 04/18/2020   CO2 31 04/18/2020   Lab Results  Component Value Date   ALT 19 02/22/2020   AST 25 02/22/2020   ALKPHOS 85 02/22/2020   BILITOT 0.3 02/22/2020   Lab Results  Component Value Date   HGBA1C 5.9 (H) 02/22/2020   HGBA1C 5.9 05/10/2019   HGBA1C 6.0 01/13/2019   Lab Results  Component Value Date   INSULIN 21.5 02/22/2020   Lab Results  Component Value Date   TSH 1.470 02/22/2020   Lab Results  Component Value Date   CHOL 151 01/13/2019   HDL 44.90 01/13/2019   LDLCALC 92 01/13/2019   TRIG 72.0 01/13/2019   CHOLHDL 3 01/13/2019   Lab Results  Component Value Date   WBC 8.1  02/22/2020   HGB 13.2 02/22/2020   HCT 41.3 02/22/2020   MCV 86 02/22/2020   PLT 332 02/22/2020   No results found for: IRON, TIBC, FERRITIN  Obesity Behavioral Intervention:   Approximately 15 minutes were spent on the discussion below.  ASK: We discussed the diagnosis of obesity with [name] today and Gailyn agreed to give [name] permission to discuss obesity behavioral modification therapy today.  ASSESS: Leaner has the diagnosis of obesity and her BMI today is 45.01. Addi is in  the action stage of change.   ADVISE: Cherilynn was educated on the multiple health risks of obesity as well as the benefit of weight loss to improve her health. She was advised of the need for long term treatment and the importance of lifestyle modifications to improve her current health and to decrease her risk of future health problems.  AGREE: Multiple dietary modification options and treatment options were discussed and Legna agreed to follow the recommendations documented in the above note.  ARRANGE: Maicy was educated on the importance of frequent visits to treat obesity as outlined per CMS and USPSTF guidelines and agreed to schedule her next follow up appointment today.  Attestation Statements:   Reviewed by clinician on day of visit: allergies, medications, problem list, medical history, surgical history, family history, social history, and previous encounter notes.   I, Trixie Dredge, am acting as transcriptionist for [name], MD.  I have reviewed the above documentation for accuracy and completeness, and I agree with the above. -  [name], MD

## 2021-01-26 ENCOUNTER — Telehealth: Payer: Self-pay

## 2021-01-26 NOTE — Progress Notes (Signed)
Mild arthritis underneath the kneecap.

## 2021-01-26 NOTE — Progress Notes (Signed)
Left voice message to  confirmed patient telephone appointment on 01/29/2021 for CCM at 8:30 am with Junius Argyle the Clinical pharmacist.   Star Rating Drug: Metformin 500 mg last filled on 01/12/2021 for 30 day supply at [facility].   Any gaps in medications fill history? None ID  Anderson Malta Clinical Pharmacist Assistant [phone]

## 2021-01-27 DIAGNOSIS — G4733 Obstructive sleep apnea (adult) (pediatric): Secondary | ICD-10-CM | POA: Diagnosis not present

## 2021-01-29 ENCOUNTER — Telehealth: Payer: Medicare HMO

## 2021-01-29 ENCOUNTER — Telehealth (INDEPENDENT_AMBULATORY_CARE_PROVIDER_SITE_OTHER): Payer: Medicare HMO | Admitting: Family Medicine

## 2021-01-29 ENCOUNTER — Encounter: Payer: Self-pay | Admitting: Family Medicine

## 2021-01-29 VITALS — Ht 63.0 in | Wt 254.0 lb

## 2021-01-29 DIAGNOSIS — L719 Rosacea, unspecified: Secondary | ICD-10-CM | POA: Diagnosis not present

## 2021-01-29 DIAGNOSIS — Z6841 Body Mass Index (BMI) 40.0 and over, adult: Secondary | ICD-10-CM

## 2021-01-29 DIAGNOSIS — M25561 Pain in right knee: Secondary | ICD-10-CM

## 2021-01-29 MED ORDER — METRONIDAZOLE 1 % EX GEL
Freq: Every day | CUTANEOUS | 1 refills | Status: AC
Start: 2021-01-29 — End: ?

## 2021-01-29 NOTE — Progress Notes (Signed)
Established Patient Office Visit  Subjective:  Patient ID: Kristi Hunter, female    DOB: 09/04/1940  Age: 80 y.o. MRN: [id number]  CC:  Chief Complaint  Patient presents with   Follow-up    Refill on gel for Rosacea flare ups.     HPI Kristi Hunter presents for follow-up of rosacea.  She has been using MetroGel for flares with great success.  There is no rash at this time.  He has been able to lose 13 pounds with medical weight loss management.  Abnormal mammogram is being followed up with ultrasound.  Continues to see sports medicine for left knee pain.  Steroid injection helped a great deal.  Past Medical History:  Diagnosis Date   Anemia    Anxiety    Arthritis    Back pain    Cancer (HCC)    uterine   Chest pain    Chicken pox    Chronic kidney disease    Constipation    Emphysema of lung (HCC)    Glaucoma    Hypertension    Joint pain    Lower extremity edema    Lymphedema    Osteoarthritis    Prediabetes    Shortness of breath    Urine incontinence    Uterine cancer (HCC)    UTI (urinary tract infection)    Vertigo    Vitamin B 12 deficiency    during pregnancy    Past Surgical History:  Procedure Laterality Date   ABDOMINAL HYSTERECTOMY  2011   REPLACEMENT TOTAL KNEE Right 2012    Family History  Problem Relation Age of Onset   Cancer Father        skin   Alcohol abuse Father    Asthma Father    COPD Father    Heart attack Father    Heart disease Father    Obesity Father    Cancer Maternal Grandfather 87       colon cancer   Heart attack Mother    Heart disease Mother    Hyperlipidemia Mother    Hypertension Mother    Stroke Mother    Miscarriages / [name] Mother    Diabetes Mother    Thyroid disease Mother    Cancer Mother    Obesity Mother    Alcohol abuse Brother    Arthritis Brother    Drug abuse Brother    Depression Daughter    Hypertension Daughter    Mental illness Daughter    Depression Son    Arthritis  Maternal Grandmother    Diabetes Maternal Grandmother    Hyperlipidemia Maternal Grandmother    Alcohol abuse Paternal Grandfather    Arthritis Paternal Grandfather    Cirrhosis Paternal Grandfather    Alcohol abuse Brother    Breast cancer Paternal Aunt     Social History   Socioeconomic History   Marital status: Widowed    Spouse name: Not on file   Number of children: 3   Years of education: Not on file   Highest education level: Not on file  Occupational History   Occupation: Reitired Engineer, maintenance (IT)    Comment: Retired Engineer, maintenance (IT)  Tobacco Use   Smoking status: Former    Pack years: 0.00    Types: Cigarettes    Quit date: 08/12/1982    Years since quitting: 38.4   Smokeless tobacco: Never  Vaping Use   Vaping Use: Never used  Substance and Sexual Activity   Alcohol use: Yes  Alcohol/week: 1.0 standard drink    Types: 1 Glasses of wine per week    Comment: every 1-2 wks.    Drug use: Never   Sexual activity: Not on file  Other Topics Concern   Not on file  Social History Narrative   Not on file   Social Determinants of Health   Financial Resource Strain: Low Risk    Difficulty of Paying Living Expenses: Not hard at all  Food Insecurity: No Food Insecurity   Worried About Running Out of Food in the Last Year: Never true   [place] in the Last Year: Never true  Transportation Needs: No Transportation Needs   Lack of Transportation (Medical): No   Lack of Transportation (Non-Medical): No  Physical Activity: Inactive   Days of Exercise per Week: 0 days   Minutes of Exercise per Session: 0 min  Stress: No Stress Concern Present   Feeling of Stress : Only a little  Social Connections: Moderately Integrated   Frequency of Communication with Friends and Family: More than three times a week   Frequency of Social Gatherings with Friends and Family: More than three times a week   Attends Religious Services: More than 4 times per year   Active Member of [employer] or Organizations:  Yes   Attends Archivist Meetings: 1 to 4 times per year   Marital Status: Widowed  Human resources officer Violence: Not At Risk   Fear of Current or Ex-Partner: No   Emotionally Abused: No   Physically Abused: No   Sexually Abused: No    Outpatient Medications Prior to Visit  Medication Sig Dispense Refill   alendronate (FOSAMAX) 70 MG tablet Take 1 tablet (70 mg total) by mouth every 7 (seven) days. Take with a full glass of water on an empty stomach. 4 tablet 11   aspirin 81 MG EC tablet Take 81 mg by mouth daily. Swallow whole.     buPROPion (WELLBUTRIN SR) 150 MG 12 hr tablet TAKE 1 TABLET (150 MG TOTAL) BY MOUTH IN THE MORNING. 30 tablet 0   chlorthalidone (HYGROTON) 25 MG tablet TAKE 1 TABLET BY MOUTH EVERY DAY 90 tablet 1   GLUCOSAMINE-CHONDROITIN-VIT D3 PO Take 2,000 Units by mouth daily.     Lactobacillus (PROBIOTIC ACIDOPHILUS PO) Take 1 capsule by mouth daily.     Melatonin 10 MG TABS Take 10 mg by mouth at bedtime.     metFORMIN (GLUCOPHAGE) 500 MG tablet Take 1 tablet (500 mg total) by mouth 2 (two) times daily with a meal. 60 tablet 0   Multiple Vitamin (MULTIVITAMIN) tablet Take 1 tablet by mouth daily.     omega-3 fish oil (MAXEPA) 1000 MG CAPS capsule Take 1 capsule by mouth daily.      traZODone (DESYREL) 50 MG tablet      Turmeric 500 MG CAPS Take 1 capsule by mouth daily.     naproxen (NAPROSYN) 500 MG tablet Take one twice daily as needed for pain with food. (Patient not taking: Reported on 01/29/2021) 60 tablet 0   calcium-vitamin D (OSCAL WITH D) 500-200 MG-UNIT tablet Take 1 tablet by mouth 2 (two) times daily. (Patient not taking: Reported on 01/29/2021)     No facility-administered medications prior to visit.    Allergies  Allergen Reactions   Percocet [Oxycodone-Acetaminophen]     ROS Review of Systems  Constitutional: Negative.   Respiratory: Negative.    Cardiovascular: Negative.   Gastrointestinal: Negative.   Musculoskeletal:  Negative  for  arthralgias.  Skin:  Negative for rash.  Psychiatric/Behavioral: Negative.       Objective:    Physical Exam Nursing note reviewed.  Pulmonary:     Effort: Pulmonary effort is normal.  Neurological:     Mental Status: She is alert and oriented to person, place, and time.    Ht 5\' 3"  (1.6 m)   Wt 254 lb (115.2 kg)   BMI 44.99 kg/m  Wt Readings from Last 3 Encounters:  01/29/21 254 lb (115.2 kg)  01/25/21 261 lb 6.4 oz (118.6 kg)  01/18/21 254 lb (115.2 kg)     Health Maintenance Due  Topic Date Due   Hepatitis C Screening  Never done   TETANUS/TDAP  Never done   Zoster Vaccines- Shingrix (1 of 2) Never done   PNA vac Low Risk Adult (1 of 2 - PCV13) Never done    There are no preventive care reminders to display for this patient.  Lab Results  Component Value Date   TSH 1.470 02/22/2020   Lab Results  Component Value Date   WBC 8.1 02/22/2020   HGB 13.2 02/22/2020   HCT 41.3 02/22/2020   MCV 86 02/22/2020   PLT 332 02/22/2020   Lab Results  Component Value Date   NA 139 04/18/2020   K 3.4 (L) 04/18/2020   CO2 31 04/18/2020   GLUCOSE 89 04/18/2020   BUN 19 04/18/2020   CREATININE 0.65 04/18/2020   BILITOT 0.3 02/22/2020   ALKPHOS 85 02/22/2020   AST 25 02/22/2020   ALT 19 02/22/2020   PROT 6.5 02/22/2020   ALBUMIN 3.7 02/22/2020   CALCIUM 9.3 04/18/2020   GFR 88.45 04/18/2020   Lab Results  Component Value Date   CHOL 151 01/13/2019   Lab Results  Component Value Date   HDL 44.90 01/13/2019   Lab Results  Component Value Date   LDLCALC 92 01/13/2019   Lab Results  Component Value Date   TRIG 72.0 01/13/2019   Lab Results  Component Value Date   CHOLHDL 3 01/13/2019   Lab Results  Component Value Date   HGBA1C 5.9 (H) 02/22/2020      Assessment & Plan:   Problem List Items Addressed This Visit       Musculoskeletal and Integument   Rosacea - Primary   Relevant Medications   metroNIDAZOLE (METROGEL) 1 % gel     Other    Class 3 severe obesity with serious comorbidity and body mass index (BMI) of 45.0 to 49.9 in adult [facility])   Right knee pain    Meds ordered this encounter  Medications   metroNIDAZOLE (METROGEL) 1 % gel    Sig: Apply topically daily.    Dispense:  60 g    Refill:  1    Follow-up: Return Return fasting for follow-up of chronic medical problems..  Advised her to follow-up fasting for her other chronic medical problems.  She told me that she would is soon and has the work-up for her abnormal mammogram was complete.  Libby Maw, MD  Virtual Visit via Telephone Note  I connected with Kristi Hunter on 01/29/21 at 10:30 AM EDT by telephone and verified that I am speaking with the correct person using two identifiers.  Location: Patient: at home alone.  Provider: work   I discussed the limitations, risks, security and privacy concerns of performing an evaluation and management service by telephone and the availability of in person appointments. I also discussed with  the patient that there may be a patient responsible charge related to this service. The patient expressed understanding and agreed to proceed.   History of Present Illness:    Observations/Objective:   Assessment and Plan:   Follow Up Instructions:    I discussed the assessment and treatment plan with the patient. The patient was provided an opportunity to ask questions and all were answered. The patient agreed with the plan and demonstrated an understanding of the instructions.   The patient was advised to call back or seek an in-person evaluation if the symptoms worsen or if the condition fails to improve as anticipated.  I provided 15 minutes of non-face-to-face time during this encounter.   Libby Maw, MD   Interactive video and audio telecommunications were attempted between myself and the patient. However they failed due to the patient having technical difficulties or not having  access to video capability. We continued and completed with audio only. Interactive video and audio telecommunications were attempted between myself and the patient. However they failed due to the patient having technical difficulties or not having access to video capability. We continued and completed with audio only.

## 2021-01-29 NOTE — Progress Notes (Deleted)
Chronic Care Management Pharmacy Note  01/29/2021 Name:  Kristi Hunter MRN:  [id number] DOB:  10-16-1940  Summary:   Recommendations/Changes made from today's visit:   Plan:    Subjective: Kristi Hunter is an 80 y.o. year old female who is a primary patient of [name] Mortimer Fries, MD.  The CCM team was consulted for assistance with disease management and care coordination needs.    Engaged with patient by telephone for follow up visit in response to provider referral for pharmacy case management and/or care coordination services.   Consent to Services:  The patient was given information about Chronic Care Management services, agreed to services, and gave verbal consent prior to initiation of services.  Please see initial visit note for detailed documentation.   Patient Care Team: Libby Maw, MD as PCP - General (Family Medicine) Germaine Pomfret, [facility] as Pharmacist (Pharmacist)  Recent office visits: 08/01/20: Patient presented to Caroleen Hamman, LPN for AWV.  7/0/26: Patient presented to Dr. [name] for follow-up.   Recent consult visits: 01/18/21: Patient presented to Dr. Leafy Ro (weight management) for pre-diabetes. 12/18/20: Patient presented to Dr. Leafy Ro (weight management) for pre-diabetes. 11/20/20: Patient presented to Dr. Leafy Ro (weight management) for pre-diabetes. 10/30/20: Patient presented to Dr. Raliegh Scarlet (weight management) for pre-diabetes. 10/09/20: Patient presented to Dr. Leafy Ro (weight management) for pre-diabetes. 09/04/20: Patient presented to Dr. Leafy Ro (weight management) for pre-diabetes.  Hospital visits: None in previous 6 months   Objective:  Lab Results  Component Value Date   CREATININE 0.65 04/18/2020   BUN 19 04/18/2020   GFR 88.45 04/18/2020   GFRNONAA 90 02/22/2020   GFRAA 104 02/22/2020   NA 139 04/18/2020   K 3.4 (L) 04/18/2020   CALCIUM 9.3 04/18/2020   CO2 31 04/18/2020   GLUCOSE 89 04/18/2020    Lab  Results  Component Value Date/Time   HGBA1C 5.9 (H) 02/22/2020 01:39 PM   HGBA1C 5.9 05/10/2019 10:18 AM   GFR 88.45 04/18/2020 09:42 AM   GFR 90.18 10/12/2019 01:48 PM   MICROALBUR 1.0 05/10/2019 10:18 AM    Last diabetic Eye exam: No results found for: HMDIABEYEEXA  Last diabetic Foot exam: No results found for: HMDIABFOOTEX   Lab Results  Component Value Date   CHOL 151 01/13/2019   HDL 44.90 01/13/2019   LDLCALC 92 01/13/2019   TRIG 72.0 01/13/2019   CHOLHDL 3 01/13/2019    Hepatic Function Latest Ref Rng & Units 02/22/2020 01/13/2019 12/04/2017  Total Protein 6.0 - 8.5 g/dL 6.5 7.2 -  Albumin 3.7 - 4.7 g/dL 3.7 3.6 -  AST 0 - 40 IU/L _0 ALT 0 - 32 IU/L 19 19 32  Alk Phosphatase 48 - 121 IU/L 85 73 95  Total Bilirubin 0.0 - 1.2 mg/dL 0.3 0.4 -    Lab Results  Component Value Date/Time   TSH 1.470 02/22/2020 01:39 PM   TSH 1.71 01/13/2019 09:13 AM   FREET4 1.27 02/22/2020 01:39 PM    CBC Latest Ref Rng & Units 02/22/2020 05/10/2019 01/13/2019  WBC 3.4 - 10.8 x10E3/uL 8.1 6.9 7.7  Hemoglobin 11.1 - 15.9 g/dL 13.2 12.4 13.4  Hematocrit 34.0 - 46.6 % 41.3 38.6 40.4  Platelets 150 - 450 x10E3/uL 332 306.0 317.0    Lab Results  Component Value Date/Time   VD25OH 42.1 02/22/2020 01:39 PM    Clinical ASCVD: No  The 10-year ASCVD risk score Mikey Bussing DC Jr., et al., 2013) is: 22.4%   Values used to calculate the  score:     Age: 70 years     Sex: Female     Is Non-Hispanic African American: No     Diabetic: No     Tobacco smoker: No     Systolic Blood Pressure: 945 mmHg     Is BP treated: Yes     HDL Cholesterol: 44.9 mg/dL     Total Cholesterol: 151 mg/dL    Depression screen [facility] 2/9 08/01/2020 02/22/2020 01/18/2020  Decreased Interest 0 2 0  Down, Depressed, Hopeless 0 2 0  PHQ - 2 Score 0 4 0  Altered sleeping - 3 -  Tired, decreased energy - 3 -  Change in appetite - 2 -  Feeling bad or failure about yourself  - 3 -  Trouble concentrating - 2 -  Moving  slowly or fidgety/restless - 1 -  Suicidal thoughts - 0 -  PHQ-9 Score - 18 -  Difficult doing work/chores - Very difficult -    Social History   Tobacco Use  Smoking Status Former   Pack years: 0.00   Types: Cigarettes   Quit date: 08/12/1982   Years since quitting: 38.4  Smokeless Tobacco Never   BP Readings from Last 3 Encounters:  01/25/21 120/62  01/18/21 125/76  12/18/20 128/82   Pulse Readings from Last 3 Encounters:  01/25/21 86  01/18/21 78  12/18/20 72   Wt Readings from Last 3 Encounters:  01/25/21 261 lb 6.4 oz (118.6 kg)  01/18/21 254 lb (115.2 kg)  12/18/20 260 lb (117.9 kg)   BMI Readings from Last 3 Encounters:  01/25/21 46.30 kg/m  01/18/21 44.99 kg/m  12/18/20 46.06 kg/m    Assessment/Interventions: Review of patient past medical history, allergies, medications, health status, including review of consultants reports, laboratory and other test data, was performed as part of comprehensive evaluation and provision of chronic care management services.   SDOH:  (Social Determinants of Health) assessments and interventions performed: Yes  SDOH Screenings   Alcohol Screen: Low Risk    Last Alcohol Screening Score (AUDIT): 2  Depression (PHQ2-9): Low Risk    PHQ-2 Score: 0  Financial Resource Strain: Low Risk    Difficulty of Paying Living Expenses: Not hard at all  Food Insecurity: No Food Insecurity   Worried About Charity fundraiser in the Last Year: Never true   Ran Out of Food in the Last Year: Never true  Housing: Low Risk    Last Housing Risk Score: 0  Physical Activity: Inactive   Days of Exercise per Week: 0 days   Minutes of Exercise per Session: 0 min  Social Connections: Moderately Integrated   Frequency of Communication with Friends and Family: More than three times a week   Frequency of Social Gatherings with Friends and Family: More than three times a week   Attends Religious Services: More than 4 times per year   Active Member of  [employer] or Organizations: Yes   Attends Archivist Meetings: 1 to 4 times per year   Marital Status: Widowed  Stress: No Stress Concern Present   Feeling of Stress : Only a little  Tobacco Use: Medium Risk   Smoking Tobacco Use: Former   Smokeless Tobacco Use: Never  Transportation Needs: No Data processing manager (Medical): No   Lack of Transportation (Non-Medical): No    CCM Care Plan  Allergies  Allergen Reactions   Percocet [Oxycodone-Acetaminophen]     Medications Reviewed Today  Reviewed by Wendy Poet, LAT (Technician) on 01/25/21 at [place] List Status: <None>   Medication Order Taking? Sig Documenting Provider Last Dose Status Informant  alendronate (FOSAMAX) 70 MG tablet [id number] Yes Take 1 tablet (70 mg total) by mouth every 7 (seven) days. Take with a full glass of water on an empty stomach. Dutch Quint B, FNP Taking Active   aspirin 81 MG EC tablet [id number] Yes Take 81 mg by mouth daily. Swallow whole. [provider] Taking Active   buPROPion [facility] SR) 150 MG 12 hr tablet [id number] Yes TAKE 1 TABLET (150 MG TOTAL) BY MOUTH IN THE MORNING. [name] D, MD Taking Active   calcium-vitamin D Darron Doom WITH D) 500-200 MG-UNIT tablet [id number] Yes Take 1 tablet by mouth 2 (two) times daily. [provider] Taking Active   chlorthalidone (HYGROTON) 25 MG tablet [id number] Yes TAKE 1 TABLET BY MOUTH EVERY DAY Kennyth Arnold, FNP Taking Active   GLUCOSAMINE-CHONDROITIN-VIT D3 PO [id number] Yes Take 2,000 Units by mouth daily. [provider] Taking Active   Lactobacillus (PROBIOTIC ACIDOPHILUS PO) [id number] Yes Take 1 capsule by mouth daily. [provider] Taking Active   Melatonin 10 MG TABS [id number] Yes Take 10 mg by mouth at bedtime. [provider] Taking Active   metFORMIN (GLUCOPHAGE) 500 MG tablet [id number] Yes Take 1 tablet (500 mg total) by mouth 2 (two) times daily with a  meal. [name] D, MD Taking Active   Multiple Vitamin (MULTIVITAMIN) tablet [id number] Yes Take 1 tablet by mouth daily. [provider] Taking Active   naproxen (NAPROSYN) 500 MG tablet [id number] Yes Take one twice daily as needed for pain with food. Libby Maw, MD Taking Active   omega-3 fish oil (MAXEPA) 1000 MG CAPS capsule [id number] Yes Take 1 capsule by mouth daily.  [provider] Taking Active   traZODone (DESYREL) 50 MG tablet [id number] Yes  [provider] Taking Active   Turmeric 500 MG CAPS [id number] Yes Take 1 capsule by mouth daily. [provider] Taking Active             Patient Active Problem List   Diagnosis Date Noted   Allergic rhinitis due to allergen 03/22/2020   OSA (obstructive sleep apnea) 03/22/2020   Left knee pain 01/18/2020   Other insomnia 01/18/2020   Apnea 01/18/2020   Class 3 severe obesity with serious comorbidity and body mass index (BMI) of 45.0 to 49.9 in adult ([place]) 01/18/2020   Musculoskeletal back pain 01/18/2020   Habitual snoring 10/12/2019   Nocturia 10/12/2019   OA (osteoarthritis) of knee 06/07/2019   Prediabetes 05/10/2019   Glaucoma of both eyes 05/10/2019   Essential hypertension 05/10/2019   Dry scalp 05/10/2019   Age-related osteoporosis without current pathological fracture 05/10/2019   Seborrheic keratosis 01/11/2019   Rosacea 01/11/2019   Family history of colon cancer 01/11/2019   Edema 08/27/2018   Primary osteoarthritis 08/27/2018   Sciatica of right side 08/27/2018   Depression, major, single episode, complete remission ([place]) 08/27/2018   Vitamin D deficiency 08/27/2018   Mixed hyperlipidemia 08/27/2018   Anxiety 08/27/2018   Hyperglycemia 08/27/2018   HTN (hypertension), benign 08/27/2018    Immunization History  Administered Date(s) Administered   Fluad Quad(high Dose 65+) 05/25/2019, 05/22/2020   Influenza,inj,Quad PF,6+ Mos 05/12/2018   PFIZER(Purple  Top)SARS-COV-2 Vaccination 10/03/2019, 10/26/2019, 06/16/2020    Conditions to be addressed/monitored:  Hypertension, Hyperlipidemia, Depression, Osteoporosis, Osteoarthritis, Allergic Rhinitis, and Prediabetes  There are no care  plans that you recently modified to display for this patient.    Medication Assistance: None required.  Patient affirms current coverage meets needs.  Compliance/Adherence/Medication fill history: Care Gaps:   Star-Rating Drugs:   Patient's preferred pharmacy [facility]:  [id number] [place] [address]Phone: [phone]: [phone] Uses pill box? {Yes or If no, why not?:20788} Pt endorses ***% compliance  We discussed: {Pharmacy options:24294} Patient decided to: {US Pharmacy Plan:23885}  Care Plan and Follow Up Patient Decision:  {FOLLOWUP:24991}  Plan: {CM FOLLOW UP PWNUU:72536}    Current Barriers:  {pharmacybarriers:24917}  Pharmacist Clinical Goal(s):  Patient will {PHARMACYGOALCHOICES:24921} through collaboration with PharmD and provider.   Interventions: 1:1 collaboration with KLibby Maw MD regarding development and update of comprehensive plan of care as evidenced by provider attestation and co-signature Inter-disciplinary care team collaboration (see longitudinal plan of care) Comprehensive medication review performed; medication list updated in electronic medical record  Hypertension (BP goal {CHL HP UPSTREAM Pharmacist BP ranges:[id number]}) -{US controlled/uncontrolled:25276} -Current treatment: Chlorthalidone 25 mg daily  -Medications previously tried: ***  -Current home readings: *** -Current dietary habits: *** -Current exercise habits: *** -{ACTIONS;DENIES/REPORTS:21021675} hypotensive/hypertensive symptoms -Educated on {CCM BP Counseling:25124} -Counseled to monitor BP at home ***, document, and provide log at future  appointments -{CCMPHARMDINTERVENTION:25122}  Hyperlipidemia: (LDL goal < ***) -{US controlled/uncontrolled:25276} -Current treatment: Omega-3 Fish Oil 1000 mg daily  -Medications previously tried: ***  -Current dietary patterns: *** -Current exercise habits: *** -Educated on {CCM HLD Counseling:25126} -{CCMPHARMDINTERVENTION:25122}  Prediabetes (A1c goal <6.5%) -Controlled -Current medications: Metformin 500 mg twice daily  -Medications previously tried: ***  -Current home glucose readings fasting glucose: *** post prandial glucose: *** -{ACTIONS;DENIES/REPORTS:21021675} hypoglycemic/hyperglycemic symptoms -Current meal patterns:  breakfast: ***  lunch: ***  dinner: *** snacks: *** drinks: *** -Current exercise: *** -Educated on {CCM DM COUNSELING:25123} -Counseled to check feet daily and get yearly eye exams -{CCMPHARMDINTERVENTION:25122}  Depression/Anxiety (Goal: ***) -{US controlled/uncontrolled:25276} -Current treatment: Wellbutrin SR 150 mg daily  -Medications previously tried/failed: *** -PHQ9: *** -GAD7: *** -Connected with *** for mental health support -Educated on {CCM mental health counseling:25127} -{CCMPHARMDINTERVENTION:25122}  Osteoporosis / Osteopenia (Goal ***) -{US controlled/uncontrolled:25276} -Last DEXA Scan: 01/22/19   T-Score femoral neck: -2.7  T-Score total hip: NA  T-Score lumbar spine: -0.9  T-Score forearm radius: NA  10-year probability of major osteoporotic fracture: NA  10-year probability of hip fracture: NA -Patient is a candidate for pharmacologic treatment due to T-Score < -2.5 in femoral neck -Current treatment  Alendronate 70 mg weekly (started 08/10/20) Calcium-Vitamin D 5064m200u twice daily  -Medications previously tried: Prolia (Cost)  -{Osteoporosis Counseling:23892} -{CCMPHARMDINTERVENTION:25122}  Patient Goals/Self-Care Activities Patient will:  - {pharmacypatientgoals:24919}  Follow Up Plan: {CM FOLLOW UP  PLUYQI:34742}

## 2021-02-01 ENCOUNTER — Ambulatory Visit
Admission: RE | Admit: 2021-02-01 | Discharge: 2021-02-01 | Disposition: A | Discharge status: Home / Self Care | Payer: Medicare HMO | Source: Ambulatory Visit | Attending: Family Medicine | Admitting: Family Medicine

## 2021-02-01 ENCOUNTER — Other Ambulatory Visit: Payer: Self-pay | Admitting: Family Medicine

## 2021-02-01 ENCOUNTER — Other Ambulatory Visit: Payer: Self-pay

## 2021-02-01 DIAGNOSIS — R928 Other abnormal and inconclusive findings on diagnostic imaging of breast: Secondary | ICD-10-CM

## 2021-02-01 DIAGNOSIS — N6310 Unspecified lump in the right breast, unspecified quadrant: Secondary | ICD-10-CM

## 2021-02-05 ENCOUNTER — Other Ambulatory Visit: Payer: Self-pay

## 2021-02-05 ENCOUNTER — Ambulatory Visit
Admission: RE | Admit: 2021-02-05 | Discharge: 2021-02-05 | Disposition: A | Discharge status: Home / Self Care | Payer: Medicare HMO | Source: Ambulatory Visit | Attending: Family Medicine | Admitting: Family Medicine

## 2021-02-05 DIAGNOSIS — Z170 Estrogen receptor positive status [ER+]: Secondary | ICD-10-CM | POA: Diagnosis not present

## 2021-02-05 DIAGNOSIS — N6310 Unspecified lump in the right breast, unspecified quadrant: Secondary | ICD-10-CM

## 2021-02-05 DIAGNOSIS — D0511 Intraductal carcinoma in situ of right breast: Secondary | ICD-10-CM | POA: Diagnosis not present

## 2021-02-05 DIAGNOSIS — C50411 Malignant neoplasm of upper-outer quadrant of right female breast: Secondary | ICD-10-CM | POA: Diagnosis not present

## 2021-02-07 ENCOUNTER — Telehealth: Payer: Self-pay | Admitting: Oncology

## 2021-02-07 DIAGNOSIS — I10 Essential (primary) hypertension: Secondary | ICD-10-CM | POA: Diagnosis not present

## 2021-02-07 DIAGNOSIS — K5900 Constipation, unspecified: Secondary | ICD-10-CM | POA: Diagnosis not present

## 2021-02-07 DIAGNOSIS — Z6841 Body Mass Index (BMI) 40.0 and over, adult: Secondary | ICD-10-CM | POA: Diagnosis not present

## 2021-02-07 DIAGNOSIS — E119 Type 2 diabetes mellitus without complications: Secondary | ICD-10-CM | POA: Diagnosis not present

## 2021-02-07 DIAGNOSIS — F419 Anxiety disorder, unspecified: Secondary | ICD-10-CM | POA: Diagnosis not present

## 2021-02-07 DIAGNOSIS — R69 Illness, unspecified: Secondary | ICD-10-CM | POA: Diagnosis not present

## 2021-02-07 DIAGNOSIS — M810 Age-related osteoporosis without current pathological fracture: Secondary | ICD-10-CM | POA: Diagnosis not present

## 2021-02-07 DIAGNOSIS — Z7983 Long term (current) use of bisphosphonates: Secondary | ICD-10-CM | POA: Diagnosis not present

## 2021-02-07 DIAGNOSIS — H547 Unspecified visual loss: Secondary | ICD-10-CM | POA: Diagnosis not present

## 2021-02-07 NOTE — Telephone Encounter (Signed)
Spoke to patient to confirm morning clinic appointment for 7/6, paperwork emailed and mailed to patient

## 2021-02-08 ENCOUNTER — Encounter: Payer: Self-pay | Admitting: *Deleted

## 2021-02-08 DIAGNOSIS — Z170 Estrogen receptor positive status [ER+]: Secondary | ICD-10-CM | POA: Insufficient documentation

## 2021-02-08 DIAGNOSIS — C50411 Malignant neoplasm of upper-outer quadrant of right female breast: Secondary | ICD-10-CM

## 2021-02-09 DIAGNOSIS — R350 Frequency of micturition: Secondary | ICD-10-CM | POA: Diagnosis not present

## 2021-02-13 ENCOUNTER — Telehealth: Payer: Self-pay

## 2021-02-13 NOTE — Progress Notes (Signed)
    Chronic Care Management Pharmacy Assistant   Name: Kristi Hunter  MRN: [id number] DOB: October 05, 1940  Reason for Encounter: Medication Review/General Adherence Call.   Recent office visits:  01/29/2021 Dr. [name] MD (PCP)   Recent consult visits:  01/25/2021 Dr. Amalia Hailey MD (Sports Medicine)  01/18/2021 Dr. Leafy Ro MD (Weight management)  Hospital visits:  None in previous 6 months  Medications: Outpatient Encounter Medications as of 02/13/2021  Medication Sig Note   alendronate (FOSAMAX) 70 MG tablet Take 1 tablet (70 mg total) by mouth every 7 (seven) days. Take with a full glass of water on an empty stomach.    aspirin 81 MG EC tablet Take 81 mg by mouth daily. Swallow whole.    buPROPion (WELLBUTRIN SR) 150 MG 12 hr tablet TAKE 1 TABLET (150 MG TOTAL) BY MOUTH IN THE MORNING.    chlorthalidone (HYGROTON) 25 MG tablet TAKE 1 TABLET BY MOUTH EVERY DAY    GLUCOSAMINE-CHONDROITIN-VIT D3 PO Take 2,000 Units by mouth daily.    Lactobacillus (PROBIOTIC ACIDOPHILUS PO) Take 1 capsule by mouth daily.    Melatonin 10 MG TABS Take 10 mg by mouth at bedtime.    metFORMIN (GLUCOPHAGE) 500 MG tablet Take 1 tablet (500 mg total) by mouth 2 (two) times daily with a meal.    metroNIDAZOLE (METROGEL) 1 % gel Apply topically daily.    Multiple Vitamin (MULTIVITAMIN) tablet Take 1 tablet by mouth daily.    naproxen (NAPROSYN) 500 MG tablet Take one twice daily as needed for pain with food. (Patient not taking: Reported on 01/29/2021)    omega-3 fish oil (MAXEPA) 1000 MG CAPS capsule Take 1 capsule by mouth daily.     traZODone (DESYREL) 50 MG tablet  01/29/2021: prn   Turmeric 500 MG CAPS Take 1 capsule by mouth daily.    No facility-administered encounter medications on file as of 02/13/2021.    Care Gaps: Per patient chart patient is due for the following care gaps: Hepatitis C screening, Tetanus Vaccine, Shingrix Vaccine and PNA Vaccine.  Star Rating Drugs: Metformin 500 mg last filled on  [facility]12/2021 [facility]  Called patient and discussed medication adherence  with patient, no issues at this time with current medication.   Patient denies ED visit since her last CPP follow up.  Patient denies any side effects with her medication. Patient denies any problems with her current pharmacy  Patient reports she was recently diagnose with breast cancer.  Patient schedule a telephone follow up with the clinical pharmacist on 04/02/2021 at 9:00 am. Sent message to scheduler.   [facility] [place] Pharmacist Assistant [phone]

## 2021-02-13 NOTE — Progress Notes (Signed)
[place]  [phone]) [phone] [phone]     ID: Kristi Hunter DOB: [birth year]  MR#: [id number]  MIW#:[id number]  Patient Care Team: Libby Maw, MD as PCP - General (Family Medicine) Germaine Pomfret, [facility] as Pharmacist (Pharmacist) Rockwell Germany, RN as Oncology Nurse Navigator Mauro Kaufmann, RN as Oncology Nurse Navigator Eligha Kmetz, Virgie Dad, MD as Consulting Physician (Oncology) Stark Klein, MD as Consulting Physician (General Surgery) Kyung Rudd, MD as Consulting Physician (Radiation Oncology) Starlyn Skeans, MD as Consulting Physician (Family Medicine) Gregor Hams, MD as Consulting Physician (Sports Medicine) Dohmeier, Asencion Partridge, MD as Consulting Physician (Neurology) Warden Fillers, MD as Consulting Physician (Ophthalmology) Robley Fries, MD as Consulting Physician (Urology) Aurea Graff OTHER MD:  CHIEF COMPLAINT: Estrogen receptor positive breast cancer  CURRENT TREATMENT: Awaiting definitive surgery   HISTORY OF CURRENT ILLNESS: "Kristi Hunter" had routine screening mammography on 01/12/2021 showing a possible abnormality in the right breast. She underwent right breast ultrasonography at [facility] on 02/01/2021 showing: 10 mm right breast mass at 10 o'clock; no right axillary adenopathy.  Accordingly on 02/05/2021 she proceeded to biopsy of the right breast area in question. The pathology from this procedure ([id number]) showed: invasive ductal carcinoma with calcifications, grade 2; ductal carcinoma in situ, intermediate grade. Prognostic indicators significant for: estrogen receptor, 100% positive and progesterone receptor, 90% positive, both with strong staining intensity. Proliferation marker Ki67 at 20%. HER2 equivocal by immunohistochemistry (2+), but negative by fluorescent in situ hybridization (as reported on conference 02/14/2021).  Cancer Staging Malignant neoplasm of upper-outer quadrant of  right breast in female, estrogen receptor positive ([place]) Staging form: Breast, AJCC 8th Edition - Clinical stage from 02/14/2021: Stage IA (cT1b, cN0, cM0, G2, ER+, PR+, HER2-) - Unsigned Stage prefix: Initial diagnosis Histologic grading system: 3 grade system  Of note, she has a history of uterine cancer, diagnosed in 2011.  The patient's subsequent history is as detailed below.   INTERVAL HISTORY: Kristi Hunter" was evaluated in the multidisciplinary breast cancer clinic on 02/14/2021 accompanied by her daughter Arbie Cookey. Her case was also presented at the multidisciplinary breast cancer conference on the same day. At that time a preliminary plan was proposed: Breast conserving surgery with no sentinel lymph node sampling, Oncotype, consider radiation, antiestrogens, and genetics testing for Lynch syndrome   REVIEW OF SYSTEMS: There were no specific symptoms leading to the original mammogram, which was routinely scheduled. On the provided questionnaire, Kristi Hunter reports loss of sleep, knee pain described as throbbing, muscle ache, and cramping, feet swelling, poor circulation, shortness of breath with climbing stairs and walking, sleeping with two pillows, incontinence, skin rash (rosacea), and back/joint pain and arthritis causing difficulties walking. The patient denies unusual headaches, visual changes, nausea, vomiting, stiff neck, dizziness, or gait imbalance. There has been no cough, phlegm production, or pleurisy, no chest pain or pressure, and no change in bowel or bladder habits. The patient denies fever, rash, bleeding, unexplained fatigue or unexplained weight loss. A detailed review of systems was otherwise entirely negative.   COVID 19 VACCINATION STATUS: Kristi Hunter x3, most recently 06/2020   PAST MEDICAL HISTORY: Past Medical History:  Diagnosis Date   Anemia    Anxiety    Arthritis    Back pain    Breast cancer (HCC)    Cancer ([place])    uterine   Chest pain    Chicken pox     Chronic kidney disease    Constipation    Emphysema of lung (  HCC)    Glaucoma    Hypertension    Joint pain    Lower extremity edema    Lymphedema    Osteoarthritis    Prediabetes    Shortness of breath    Urine incontinence    Uterine cancer (HCC)    UTI (urinary tract infection)    Vertigo    Vitamin B 12 deficiency    during pregnancy    PAST SURGICAL HISTORY: Past Surgical History:  Procedure Laterality Date   ABDOMINAL HYSTERECTOMY  2011   REPLACEMENT TOTAL KNEE Right 2012    FAMILY HISTORY: Family History  Problem Relation Age of Onset   Cancer Father        skin   Alcohol abuse Father    Asthma Father    COPD Father    Heart attack Father    Heart disease Father    Obesity Father    Cancer Maternal Grandfather 8       colon cancer   Heart attack Mother    Heart disease Mother    Hyperlipidemia Mother    Hypertension Mother    Stroke Mother    62 / [name] Mother    Diabetes Mother    Thyroid disease Mother    Cancer Mother    Obesity Mother    Alcohol abuse Brother    Arthritis Brother    Drug abuse Brother    Depression Daughter    Hypertension Daughter    Mental illness Daughter    Depression Son    Arthritis Maternal Grandmother    Diabetes Maternal Grandmother    Hyperlipidemia Maternal Grandmother    Alcohol abuse Paternal Grandfather    Arthritis Paternal Grandfather    Cirrhosis Paternal Grandfather    Alcohol abuse Brother    Breast cancer Paternal Aunt    Her father died at age 7 from COPD. Her mother died at age 38 from CHF. Kristi Hunter has four brothers and one sister. She reports breast cancer in a paternal aunt at age 11, colon cancer in her maternal grandfather at age 80 and again at age 21, and melanoma on both sides of her family.   GYNECOLOGIC HISTORY:  No LMP recorded. Patient has had a hysterectomy. Menarche: 80 years old Age at first live birth: 80 years old [place] P 3 LMP 1990 Contraceptive: used from [place] HRT never used  Hysterectomy? Yes, 2011 for uterine cancer BSO? yes   SOCIAL HISTORY: (updated 02/2021)  Kristi Hunter "Kristi Hunter" is currently retired from working as a Engineer, maintenance (IT). She is widowed. Her late husband, Trilby Drummer, was a Building services engineer. She lives at home by herself, with no pets. Daughter Robbie Lis, age 27, is a retired Chief Technology Officer living in [place]. Son Elyse Jarvis, age 77, is an Optometrist in [place]. Son Precious Gilding, age 78, is a Librarian, academic for state agency in [place], [place]. Kristi Hunter has two grandchildren and one great-grandchild. She attends a [employer] in [place].    ADVANCED DIRECTIVES: in place, sister Carole Civil is her HCPOA   HEALTH MAINTENANCE: Social History   Tobacco Use   Smoking status: Former    Pack years: 0.00    Types: Cigarettes    Quit date: 08/12/1982    Years since quitting: 38.5   Smokeless tobacco: Never  Vaping Use   Vaping Use: Never used  Substance Use Topics   Alcohol use: Yes    Alcohol/week: 1.0 standard drink    Types: 1 Glasses  of wine per week    Comment: every 1-2 wks.    Drug use: Never     Colonoscopy: never done (notes she can't do prep, has completed Cologard)  PAP: 2016  Bone density: 01/2019, -2.8   Allergies  Allergen Reactions   Percocet [Oxycodone-Acetaminophen] Other (See Comments)    hallucinations    Current Outpatient Medications  Medication Sig Dispense Refill   alendronate (FOSAMAX) 70 MG tablet Take 1 tablet (70 mg total) by mouth every 7 (seven) days. Take with a full glass of water on an empty stomach. 4 tablet 11   aspirin 81 MG EC tablet Take 81 mg by mouth daily. Swallow whole.     buPROPion (WELLBUTRIN SR) 150 MG 12 hr tablet TAKE 1 TABLET (150 MG TOTAL) BY MOUTH IN THE MORNING. 30 tablet 0   chlorthalidone (HYGROTON) 25 MG tablet TAKE 1 TABLET BY MOUTH EVERY DAY 90 tablet 1   GLUCOSAMINE-CHONDROITIN-VIT D3 PO Take 2,000 Units by mouth daily.     Lactobacillus  (PROBIOTIC ACIDOPHILUS PO) Take 1 capsule by mouth daily.     Melatonin 10 MG TABS Take 10 mg by mouth at bedtime.     metFORMIN (GLUCOPHAGE) 500 MG tablet Take 1 tablet (500 mg total) by mouth 2 (two) times daily with a meal. 60 tablet 0   metroNIDAZOLE (METROGEL) 1 % gel Apply topically daily. 60 g 1   Multiple Vitamin (MULTIVITAMIN) tablet Take 1 tablet by mouth daily.     omega-3 fish oil (MAXEPA) 1000 MG CAPS capsule Take 1 capsule by mouth daily.      traZODone (DESYREL) 50 MG tablet      Turmeric 500 MG CAPS Take 1 capsule by mouth daily.     naproxen (NAPROSYN) 500 MG tablet Take one twice daily as needed for pain with food. (Patient not taking: Reported on 02/14/2021) 60 tablet 0   No current facility-administered medications for this visit.    OBJECTIVE: White woman who appears stated age  80:   02/14/21 0835  BP: (!) 142/67  Pulse: 77  Resp: 19  Temp: 97.6 F (36.4 C)  SpO2: 97%     Body mass index is 45.33 kg/m.   Wt Readings from Last 3 Encounters:  02/14/21 255 lb 14.4 oz (116.1 kg)  01/29/21 254 lb (115.2 kg)  01/25/21 261 lb 6.4 oz (118.6 kg)      ECOG FS:1 - Symptomatic but completely ambulatory  Ocular: Sclerae unicteric, pupils round and equal Ear-nose-throat: Wearing a mask Lymphatic: No cervical or supraclavicular adenopathy Lungs no rales or rhonchi Heart regular rate and rhythm Abd soft, obese, nontender, positive bowel sounds MSK no focal spinal tenderness, no joint edema Neuro: non-focal, well-oriented, appropriate affect Breasts: The right breast is status post recent biopsy.  There is a minimal ecchymosis.  I do not palpate a mass.  There are no skin or nipple changes of concern.  The left breast and both axillae are benign.   LAB RESULTS:  CMP     Component Value Date/Time   NA 141 02/14/2021 0826   NA 140 02/22/2020 1339   K 3.9 02/14/2021 0826   CL 104 02/14/2021 0826   CO2 31 02/14/2021 0826   GLUCOSE 89 02/14/2021 0826   BUN 18  02/14/2021 0826   BUN 18 02/22/2020 1339   CREATININE 0.71 02/14/2021 0826   CALCIUM 9.1 02/14/2021 0826   PROT 7.0 02/14/2021 0826   PROT 6.5 02/22/2020 1339   ALBUMIN 3.0 (L) 02/14/2021  0826   ALBUMIN 3.7 02/22/2020 1339   AST 17 02/14/2021 0826   ALT 21 02/14/2021 0826   ALKPHOS 62 02/14/2021 0826   BILITOT 0.3 02/14/2021 0826   GFRNONAA >60 02/14/2021 0826   GFRAA 104 02/22/2020 1339    No results found for: TOTALPROTELP, ALBUMINELP, A1GS, A2GS, BETS, BETA2SER, GAMS, MSPIKE, SPEI  Lab Results  Component Value Date   WBC 8.6 02/14/2021   NEUTROABS 5.9 02/14/2021   HGB 12.8 02/14/2021   HCT 39.3 02/14/2021   MCV 85.6 02/14/2021   PLT 291 02/14/2021    No results found for: LABCA2  No components found for: SPQZRA076  No results for input(s): INR in the last 168 hours.  No results found for: LABCA2  No results found for: AUQ333  No results found for: LKT625  No results found for: WLS937  No results found for: CA2729  No components found for: HGQUANT  No results found for: CEA1 / No results found for: CEA1   No results found for: AFPTUMOR  No results found for: CHROMOGRNA  No results found for: KPAFRELGTCHN, LAMBDASER, KAPLAMBRATIO (kappa/lambda light chains)  No results found for: HGBA, HGBA2QUANT, HGBFQUANT, HGBSQUAN (Hemoglobinopathy evaluation)   No results found for: LDH  No results found for: IRON, TIBC, IRONPCTSAT (Iron and TIBC)  No results found for: FERRITIN  Urinalysis    Component Value Date/Time   COLORURINE YELLOW 10/13/2019 [place] 10/13/2019 1613   LABSPEC 1.020 10/13/2019 1613   PHURINE 7.0 10/13/2019 1613   GLUCOSEU NEGATIVE 10/13/2019 1613   HGBUR NEGATIVE 10/13/2019 1613   BILIRUBINUR NEGATIVE 10/13/2019 1613   KETONESUR NEGATIVE 10/13/2019 1613   UROBILINOGEN 0.2 10/13/2019 1613   NITRITE NEGATIVE 10/13/2019 1613   LEUKOCYTESUR NEGATIVE 10/13/2019 1613     STUDIES: US BREAST LTD UNI RIGHT INC  AXILLA  Result Date: 02/01/2021 CLINICAL DATA:  Patient recalled from screening for right breast mass. EXAM: ULTRASOUND OF THE RIGHT BREAST COMPARISON:  Previous exam(s). FINDINGS: Targeted ultrasound is performed, showing a 10 x 7 x 8 mm irregular hypoechoic mass right breast 10 o'clock position 1 cm from the nipple. No right axillary adenopathy. IMPRESSION: Suspicious right breast mass 10 o'clock position. RECOMMENDATION: Ultrasound-guided core needle biopsy right breast mass 10 o'clock position. I have discussed the findings and recommendations with the patient. If applicable, a reminder letter will be sent to the patient regarding the next appointment. BI-RADS CATEGORY  4: Suspicious. Electronically Signed   By: Lovey Newcomer M.D.   On: 02/01/2021 13:56  [name] LIMITED JOINT SPACE STRUCTURES LOW LEFT(NO LINKED CHARGES)  Result Date: 02/08/2021 Formatting of this result is different from the original. Procedure: Real-time Ultrasound Guided Injection of left knee superior lateral patellar space Device: Philips Affiniti 50G Images permanently stored and available for review in PACS Verbal informed consent obtained.  Discussed risks and benefits of procedure. Warned about infection bleeding damage to structures skin hypopigmentation and fat atrophy among others. Patient expresses understanding and agreement Time-out conducted.   Noted no overlying erythema, induration, or other signs of local infection.   Skin prepped in a sterile fashion.   Local anesthesia: Topical Ethyl chloride.   With sterile technique and under real time ultrasound guidance: 40 mg of Kenalog and 2 mL of Marcaine injected into knee joint. Fluid seen entering the joint capsule.   Completed without difficulty   Pain immediately resolved suggesting accurate placement of the medication.   Advised to call if fevers/chills, erythema, induration, drainage, or persistent bleeding.  Images permanently stored and available for review in the ultrasound  unit. Impression: Technically successful ultrasound guided injection.    MM CLIP PLACEMENT RIGHT  Result Date: 02/05/2021 CLINICAL DATA:  Evaluate post biopsy marker clip placement following ultrasound-guided core needle biopsy of a right breast mass. EXAM: 3D DIAGNOSTIC RIGHT MAMMOGRAM POST ULTRASOUND BIOPSY COMPARISON:  Previous exam(s). FINDINGS: 3D Mammographic images were obtained following ultrasound guided biopsy of a right breast mass. The biopsy marking clip is in expected position at the site of biopsy. IMPRESSION: Appropriate positioning of the ribbon shaped biopsy marking clip at the site of biopsy in the along the lateral margin of the mass and architectural distortion. Final Assessment: Post Procedure Mammograms for Marker Placement Electronically Signed   By: Lajean Manes M.D.   On: 02/05/2021 14:29  [name] RT BREAST BX W LOC DEV 1ST LESION IMG BX SPEC US GUIDE  Addendum Date: 02/06/2021   ADDENDUM REPORT: 02/06/2021 14:24 ADDENDUM: Pathology revealed GRADE II INVASIVE DUCTAL CARCINOMA WITH CALCIFICATIONS, INTERMEDIATE GRADE DUCTAL CARCINOMA IN SITU of the Right breast, 10 o'clock, 1cmfn. This was found to be concordant by Dr. Lajean Manes. Pathology results were discussed with the patient by telephone. The patient reported doing well after the biopsy with minimal tenderness at the site. Post biopsy instructions and care were reviewed and questions were answered. The patient was encouraged to call [facility] for any additional concerns. My direct phone number was provided. The patient was referred to [facility] on February 14, 2021. Pathology results reported by Terie Purser, RN on 02/06/2021. Electronically Signed   By: Lajean Manes M.D.   On: 02/06/2021 14:24   Result Date: 02/06/2021 CLINICAL DATA:  Patient presents for ultrasound-guided core needle biopsy of an irregular right breast mass.  EXAM: ULTRASOUND GUIDED RIGHT BREAST CORE NEEDLE BIOPSY COMPARISON:  Previous exam(s). PROCEDURE: I met with the patient and we discussed the procedure of ultrasound-guided biopsy, including benefits and alternatives. We discussed the high likelihood of a successful procedure. We discussed the risks of the procedure, including infection, bleeding, tissue injury, clip migration, and inadequate sampling. Informed written consent was given. The usual time-out protocol was performed immediately prior to the procedure. Lesion quadrant: Upper outer quadrant Using sterile technique and 1% Lidocaine as local anesthetic, under direct ultrasound visualization, a 12 gauge spring-loaded device was used to perform biopsy of the 10 o'clock position irregular hypoechoic mass and associated architectural distortion using an inferior approach. At the conclusion of the procedure ribbon shaped tissue marker clip was deployed into the biopsy cavity. Follow up 2 view mammogram was performed and dictated separately. IMPRESSION: Ultrasound guided biopsy of a right breast mass. No apparent complications. Electronically Signed: By: Lajean Manes M.D. On: 02/05/2021 14:12  DG Knee AP/LAT W/[place] Left  Result Date: 01/26/2021 CLINICAL DATA:  Left knee pain EXAM: LEFT KNEE 3 VIEWS COMPARISON:  None. FINDINGS: No evidence of fracture, dislocation, or joint effusion. There is mild patellofemoral osteoarthrosis. Soft tissues are unremarkable. IMPRESSION: Mild patellofemoral osteoarthrosis. Electronically Signed   By: Ulyses Jarred M.D.   On: 01/26/2021 01:23     ELIGIBLE FOR AVAILABLE RESEARCH PROTOCOL: no  ASSESSMENT: 80 y.o. [place] woman status post right breast upper outer quadrant biopsy 02/05/2021 for a clinical T1b N0, stage IA invasive ductal carcinoma, grade 2, estrogen and progesterone receptor positive, HER2 not amplified, with an MIB-1 of 20%.  (1) genetics testing   (2)  breast conserving surgery pending  (3)  Oncotype to be obtained from the definitive surgical sample  (4) adjuvant radiation  (5) antiestrogens  PLAN: I met today with Quanesha to review her new diagnosis. Specifically we discussed the biology of her breast cancer, its diagnosis, staging, treatment  options and prognosis. We first reviewed the fact that cancer is not one disease but more than 100 different diseases and that it is important to keep them separate-- otherwise when friends and relatives discuss their own cancer experiences with Ivy confusion can result. Similarly we explained that if breast cancer spreads to the bone or liver, the patient would not have bone cancer or liver cancer, but breast cancer in the bone and breast cancer in the liver: one cancer in three places-- not 3 different cancers which otherwise would have to be treated in 3 different ways.  We discussed the difference between local and systemic therapy. In terms of loco-regional treatment, lumpectomy plus radiation is equivalent to mastectomy as far as survival is concerned. For this reason, and because the cosmetic results are generally superior, we recommend breast conserving surgery.   We then discussed the rationale for systemic therapy. There is some risk that this cancer may have already spread to other parts of her body. Patients frequently ask at this point about bone scans, CAT scans and PET scans to find out if they have occult breast cancer somewhere else. The problem is that in early stage disease we are much more likely to find false positives then true cancers and this would expose the patient to unnecessary procedures as well as unnecessary radiation. Scans cannot answer the question the patient really would like to know, which is whether she has microscopic disease elsewhere in her body. For those reasons we do not recommend them.  Of course we would proceed to aggressive evaluation of any symptoms that might suggest metastatic disease, but  that is not the case here.  Next we went over the options for systemic therapy which are anti-estrogens, anti-HER-2 immunotherapy, and chemotherapy. Allanah does not meet criteria for anti-HER-2 immunotherapy. She is a good candidate for anti-estrogens.  The question of chemotherapy is more complicated. Chemotherapy is most effective in rapidly growing, aggressive tumors. It is much less effective in low-grade, slow growing cancers. Mearl 's case is intermediate.  For that reason we are going to request an Oncotype from the definitive surgical sample, as suggested by NCCN guidelines. That will help [name] make a definitive decision regarding chemotherapy in this case.  Catheters genetics testing will include breast cancer genes but is chiefly to rule out Lynch syndrome.  It is a concern that she has not been able to undergo colonoscopy given the family history of colon cancer.  Maie has a good understanding of the overall plan. She agrees with it. She knows the goal of treatment in her case is cure. She will call with any problems that may develop before her next visit here.  Total encounter time 55 minutes.Sarajane Jews C. Ajanee Buren, MD 02/14/2021 9:51 AM Medical Oncology and Hematology [facility] [place], [place] [address] Tel. [phone]    Fax. [phone]   This document serves as a record of services personally performed by Lurline Del, MD. It was created on his behalf by Wilburn Mylar, a trained medical scribe. The creation of this record is based on the scribe's personal observations and the provider's statements to them.   Lindie Spruce MD, have  reviewed the above documentation for accuracy and completeness, and I agree with the above.    *Total Encounter Time as defined by the Centers for Medicare and Medicaid Services includes, in addition to the face-to-face time of a patient visit (documented in the note above) non-face-to-face  time: obtaining and reviewing outside history, ordering and reviewing medications, tests or procedures, care coordination (communications with other health care professionals or caregivers) and documentation in the medical record.

## 2021-02-14 ENCOUNTER — Ambulatory Visit
Admission: RE | Admit: 2021-02-14 | Discharge: 2021-02-14 | Disposition: A | Discharge status: Home / Self Care | Payer: Medicare HMO | Source: Ambulatory Visit | Attending: Radiation Oncology | Admitting: Radiation Oncology

## 2021-02-14 ENCOUNTER — Other Ambulatory Visit: Payer: Self-pay | Admitting: General Surgery

## 2021-02-14 ENCOUNTER — Other Ambulatory Visit: Payer: Self-pay

## 2021-02-14 ENCOUNTER — Ambulatory Visit: Payer: Medicare HMO | Admitting: Physical Therapy

## 2021-02-14 ENCOUNTER — Encounter: Payer: Self-pay | Admitting: *Deleted

## 2021-02-14 ENCOUNTER — Encounter: Payer: Self-pay | Admitting: Licensed Clinical Social Worker

## 2021-02-14 ENCOUNTER — Ambulatory Visit (HOSPITAL_BASED_OUTPATIENT_CLINIC_OR_DEPARTMENT_OTHER): Payer: Medicare HMO | Admitting: Genetic Counselor

## 2021-02-14 ENCOUNTER — Encounter: Payer: Self-pay | Admitting: Genetic Counselor

## 2021-02-14 ENCOUNTER — Inpatient Hospital Stay: Payer: Medicare HMO | Attending: Oncology | Admitting: Oncology

## 2021-02-14 ENCOUNTER — Inpatient Hospital Stay: Payer: Medicare HMO

## 2021-02-14 ENCOUNTER — Encounter: Payer: Self-pay | Admitting: Oncology

## 2021-02-14 VITALS — BP 142/67 | HR 77 | Temp 97.6°F | Resp 19 | Ht 63.0 in | Wt 255.9 lb

## 2021-02-14 DIAGNOSIS — I10 Essential (primary) hypertension: Secondary | ICD-10-CM | POA: Diagnosis not present

## 2021-02-14 DIAGNOSIS — Z7984 Long term (current) use of oral hypoglycemic drugs: Secondary | ICD-10-CM | POA: Insufficient documentation

## 2021-02-14 DIAGNOSIS — N189 Chronic kidney disease, unspecified: Secondary | ICD-10-CM | POA: Diagnosis not present

## 2021-02-14 DIAGNOSIS — Z7982 Long term (current) use of aspirin: Secondary | ICD-10-CM | POA: Insufficient documentation

## 2021-02-14 DIAGNOSIS — C50411 Malignant neoplasm of upper-outer quadrant of right female breast: Secondary | ICD-10-CM

## 2021-02-14 DIAGNOSIS — Z803 Family history of malignant neoplasm of breast: Secondary | ICD-10-CM | POA: Insufficient documentation

## 2021-02-14 DIAGNOSIS — Z87891 Personal history of nicotine dependence: Secondary | ICD-10-CM | POA: Diagnosis not present

## 2021-02-14 DIAGNOSIS — Z809 Family history of malignant neoplasm, unspecified: Secondary | ICD-10-CM | POA: Diagnosis not present

## 2021-02-14 DIAGNOSIS — Z8542 Personal history of malignant neoplasm of other parts of uterus: Secondary | ICD-10-CM | POA: Diagnosis not present

## 2021-02-14 DIAGNOSIS — Z170 Estrogen receptor positive status [ER+]: Secondary | ICD-10-CM

## 2021-02-14 DIAGNOSIS — Z800 Family history of malignant neoplasm of digestive organs: Secondary | ICD-10-CM | POA: Diagnosis not present

## 2021-02-14 DIAGNOSIS — J439 Emphysema, unspecified: Secondary | ICD-10-CM | POA: Diagnosis not present

## 2021-02-14 DIAGNOSIS — C50211 Malignant neoplasm of upper-inner quadrant of right female breast: Secondary | ICD-10-CM | POA: Diagnosis not present

## 2021-02-14 DIAGNOSIS — Z840 Family history of diseases of the skin and subcutaneous tissue: Secondary | ICD-10-CM | POA: Diagnosis not present

## 2021-02-14 DIAGNOSIS — Z8249 Family history of ischemic heart disease and other diseases of the circulatory system: Secondary | ICD-10-CM | POA: Diagnosis not present

## 2021-02-14 HISTORY — DX: Family history of malignant neoplasm of breast: Z80.3

## 2021-02-14 HISTORY — DX: Personal history of malignant neoplasm of other parts of uterus: Z85.42

## 2021-02-14 LAB — CMP (CANCER CENTER ONLY)
ALT: 21 U/L (ref 0–44)
AST: 17 U/L (ref 15–41)
Albumin: 3 g/dL — ABNORMAL LOW (ref 3.5–5.0)
Alkaline Phosphatase: 62 U/L (ref 38–126)
Anion gap: 6 (ref 5–15)
BUN: 18 mg/dL (ref 8–23)
CO2: 31 mmol/L (ref 22–32)
Calcium: 9.1 mg/dL (ref 8.9–10.3)
Chloride: 104 mmol/L (ref 98–111)
Creatinine: 0.71 mg/dL (ref 0.44–1.00)
GFR, Estimated: >60 mL/min (ref >60)
Glucose, Bld: 89 mg/dL (ref 70–99)
Potassium: 3.9 mmol/L (ref 3.5–5.1)
Sodium: 141 mmol/L (ref 135–145)
Total Bilirubin: 0.3 mg/dL (ref 0.3–1.2)
Total Protein: 7 g/dL (ref 6.5–8.1)

## 2021-02-14 LAB — CBC WITH DIFFERENTIAL (CANCER CENTER ONLY)
Abs Immature Granulocytes: 0.02 10*3/uL (ref 0.00–0.07)
Basophils Absolute: 0.1 10*3/uL (ref 0.0–0.1)
Basophils Relative: 1 %
Eosinophils Absolute: 0.2 10*3/uL (ref 0.0–0.5)
Eosinophils Relative: 2 %
HCT: 39.3 % (ref 36.0–46.0)
Hemoglobin: 12.8 g/dL (ref 12.0–15.0)
Immature Granulocytes: 0 %
Lymphocytes Relative: 23 %
Lymphs Abs: 2 10*3/uL (ref 0.7–4.0)
MCH: 27.9 pg (ref 26.0–34.0)
MCHC: 32.6 g/dL (ref 30.0–36.0)
MCV: 85.6 fL (ref 80.0–100.0)
Monocytes Absolute: 0.5 10*3/uL (ref 0.1–1.0)
Monocytes Relative: 6 %
Neutro Abs: 5.9 10*3/uL (ref 1.7–7.7)
Neutrophils Relative %: 68 %
Platelet Count: 291 10*3/uL (ref 150–400)
RBC: 4.59 MIL/uL (ref 3.87–5.11)
RDW: 13.7 % (ref 11.5–15.5)
WBC Count: 8.6 10*3/uL (ref 4.0–10.5)
nRBC: 0 % (ref 0.0–0.2)

## 2021-02-14 LAB — GENETIC SCREENING ORDER

## 2021-02-14 NOTE — Addendum Note (Signed)
Addended by: Chauncey Cruel on: 02/14/2021 06:15 PM   Modules accepted: Orders

## 2021-02-14 NOTE — Progress Notes (Signed)
[place] Work  Initial Assessment   Kristi Hunter "Kristi Hunter" is a 80 y.o. year old female accompanied by daughter, Kristi Hunter. Clinical Social Work was referred by [facility] for assessment of psychosocial needs.   SDOH (Social Determinants of Health) assessments performed: Yes SDOH Interventions    Flowsheet Row Most Recent Value  SDOH Interventions   Food Insecurity Interventions Intervention Not Indicated  Financial Strain Interventions Intervention Not Indicated  Housing Interventions Intervention Not Indicated  Transportation Interventions Intervention Not Indicated       Distress Screen completed: Yes ONCBCN DISTRESS SCREENING 02/14/2021  Screening Type Initial Screening  Distress experienced in past week (1-10) 7  Family Problem type Children  Emotional problem type Adjusting to illness  Information Concerns Type Lack of info about diagnosis;Lack of info about treatment;Lack of info about complementary therapy choices  Physical Problem type Pain;Sleep/insomnia;Getting around;Breathing;Tingling hands/feet      Family/Social Information:  Housing Arrangement: patient lives alone Family members/support persons in your life? Family (daughter, granddaughter, son, extended family in [place]) and Friends Transportation concerns: no  Employment: Retired. Income source: Conservation officer, historic buildings and Development worker, international aid concerns: No Type of concern: None Food access concerns: no Services Currently in place:  n/a  Coping/ Adjustment to diagnosis: Patient understands treatment plan and what happens next? yes, is ready to get started and gets stressed with the waiting Concerns about diagnosis and/or treatment:  General adjustment and struggling with the "hurry up and wait" feeling Patient reported stressors: Adjusting to my illness Hopes and priorities: hopes to have the cancer removed and continue with her regular life Patient enjoys  card (bridge, Chief Strategy Officer) and tile ([place], [place]  train) games, trading stocks Current coping skills/ strengths: Capable of independent living, Motivation for treatment/growth, Special hobby/interest, and Supportive family/friends    SUMMARY: Current SDOH Barriers:  No significant SDOH concerns at this time  Clinical Social Work Clinical Goal(s):  No clinical SW goals at this time  Interventions: Discussed common feeling and emotions when being diagnosed with cancer, and the importance of support during treatment Informed patient of the support team roles and support services at [facility] Provided CSW contact information and encouraged patient to call with any questions or concerns   Follow Up Plan: Patient will contact CSW with any support or resource needs Patient verbalizes understanding of plan: Yes    Christeen Douglas , LCSW

## 2021-02-14 NOTE — Progress Notes (Signed)
REFERRING PROVIDER: Chauncey Cruel, MD [address] [place],  [place] [address]  PRIMARY PROVIDER:  Libby Maw, MD  PRIMARY REASON FOR VISIT:  1. Malignant neoplasm of upper-outer quadrant of right breast in female, estrogen receptor positive ([place])   2. Personal history of malignant neoplasm of uterus   3. Family history of colon cancer   4. Family history of breast cancer     HISTORY OF PRESENT ILLNESS:   Kristi Hunter, a 80 y.o. female, was seen for a [facility] cancer genetics consultation at the request of Dr. Jana Hakim due to a personal and family history of cancer.  Kristi Hunter presents to clinic today to discuss the possibility of a hereditary predisposition to cancer, to discuss genetic testing, and to further clarify her future cancer risks, as well as potential cancer risks for family members.   In 2022, at the age of 11, Kristi Hunter was diagnosed with invasive ductal carcinoma of the right breast (ER+/PR+/HER2-). The treatment plan is pending.  She also has a history of uterine cancer in 2011.   CANCER HISTORY:  Oncology History   No history exists.    RISK FACTORS:  Menarche was at age 30.  First live birth at age 50.  OCP use for approximately 10 years.  Hysterectomy: yes in 2011 due to uterine cancer Menopausal status: postmenopausal.  HRT use: 0 years. Colonoscopy: no;  had Cologuard previously . Mammogram within the last year: yes. Up to date with pelvic exams: most recent PAP in 2016.  Past Medical History:  Diagnosis Date   Anemia    Anxiety    Arthritis    Back pain    Breast cancer (HCC)    Cancer ([place])    uterine   Chest pain    Chicken pox    Chronic kidney disease    Constipation    Emphysema of lung ([place])    Family history of breast cancer 02/14/2021   Glaucoma    Hypertension    Joint pain    Lower extremity edema    Lymphedema    Osteoarthritis    Personal history of malignant neoplasm of uterus 02/14/2021   Prediabetes     Shortness of breath    Urine incontinence    Uterine cancer ([place])    UTI (urinary tract infection)    Vertigo    Vitamin B 12 deficiency    during pregnancy    Past Surgical History:  Procedure Laterality Date   ABDOMINAL HYSTERECTOMY  2011   REPLACEMENT TOTAL KNEE Right 2012    Social History   Socioeconomic History   Marital status: Widowed    Spouse name: Not on file   Number of children: 3   Years of education: Not on file   Highest education level: Not on file  Occupational History   Occupation: Reitired Engineer, maintenance (IT)    Comment: Retired Engineer, maintenance (IT)  Tobacco Use   Smoking status: Former    Pack years: 0.00    Types: Cigarettes    Quit date: 08/12/1982    Years since quitting: 38.5   Smokeless tobacco: Never  Vaping Use   Vaping Use: Never used  Substance and Sexual Activity   Alcohol use: Yes    Alcohol/week: 1.0 standard drink    Types: 1 Glasses of wine per week    Comment: every 1-2 wks.    Drug use: Never   Sexual activity: Not on file  Other Topics Concern   Not on file  Social History Narrative   Not on file   Social Determinants of Health   Financial Resource Strain: Low Risk    Difficulty of Paying Living Expenses: Not hard at all  Food Insecurity: No Food Insecurity   Worried About Charity fundraiser in the Last Year: Never true   Arboriculturist in the Last Year: Never true  Transportation Needs: No Transportation Needs   Lack of Transportation (Medical): No   Lack of Transportation (Non-Medical): No  Physical Activity: Inactive   Days of Exercise per Week: 0 days   Minutes of Exercise per Session: 0 min  Stress: No Stress Concern Present   Feeling of Stress : Only a little  Social Connections: Moderately Integrated   Frequency of Communication with Friends and Family: More than three times a week   Frequency of Social Gatherings with Friends and Family: More than three times a week   Attends Religious Services: More than 4 times per year   Active  Member of [employer] or Organizations: Yes   Attends Archivist Meetings: 1 to 4 times per year   Marital Status: Widowed     FAMILY HISTORY:  We obtained a detailed, 4-generation family history.  Significant diagnoses are listed below: Family History  Problem Relation Age of Onset   Melanoma Mother        dx after 25, sun-exposed areas   Melanoma Father        dx after 5, back   Cancer Brother        ? esophageal, d. 103   Breast cancer Paternal Aunt 91   Colon cancer Maternal Grandfather        dx 44, dx 23     Kristi Hunter is unaware of previous family history of genetic testing for hereditary cancer risks. There is no reported Ashkenazi Jewish ancestry. There is no known consanguinity between her parents.   GENETIC COUNSELING ASSESSMENT: Kristi Hunter is a 80 y.o. female with a personal and family history of cancer which is somewhat suggestive of a hereditary cancer syndrome and predisposition to cancer given the presence of related cancers in the family. We, therefore, discussed and recommended the following at today's visit.   DISCUSSION: We discussed that 5 - 10% of cancer is hereditary, with most cases of hereditary uterine and colon cancers associated with mutations in the Lynch syndrome genes.  There are other genes that can be associated with hereditary colon and breast cancer syndromes.  We discussed that testing is beneficial for several reasons including knowing how to follow individuals after completing their treatment, identifying whether potential treatment options would be beneficial, and understanding if other family members could be at risk for cancer and allowing them to undergo genetic testing.   We reviewed the characteristics, features and inheritance patterns of hereditary cancer syndromes. We also discussed genetic testing, including the appropriate family members to test, the process of testing, insurance coverage and turn-around-time for results. We discussed  the implications of a negative, positive, carrier and/or variant of uncertain significant result. We recommended Kristi Hunter pursue genetic testing for a panel that includes genes associated with uterine, colon, and breast cancer.   Kristi Hunter  was offered a common hereditary cancer panel (40+ genes) and Hunter expanded pan-cancer panel (70+ genes). Kristi Hunter was informed of the benefits and limitations of each panel, including that expanded pan-cancer panels contain genes that do not have clear management guidelines at this point in time.  We  also discussed that as the number of genes included on a panel increases, the chances of variants of uncertain significance increases.  After considering the benefits and limitations of each gene panel, Kristi Hunter  elected to have Hunter expanded pan-cancer panel through [place].  She also requested that her genetic testing panel include CFTR, given that her daughter was found to be a heterozygous CFTR carrier during prenatal screening.  She wishes to know her CFTR-carrier status to provide information to family members of reproductive age.   The CustomNext-Cancer +RNAinsight Panel offered by [facility] and includes sequencing and rearrangement analysis for the following 91 genes: AIP, ALK, APC, ATM, AXIN2, BAP1, BARD1, BLM, BMPR1A, BRCA1, BRCA2, BRIP1, CDC73, CDH1, CDK4, CDKN1B, CDKN2A, CHEK2, CTNNA1, DICER1, FANCC, FH, FLCN, GALNT12, KIF1B, LZTR1, MAX, MEN1, MET, MLH1, MRE11A, MSH2, MSH3, MSH6, MUTYH, NBN, NF1, NF2, NTHL1, PALB2, PHOX2B, PMS2, POT1, PRKAR1A, PTCH1, PTEN, RAD50, RAD51C, RAD51D, RB1, RECQL, RET, SDHA, SDHAF2, SDHB, SDHC, SDHD, SMAD4, SMARCA4, SMARCB1, SMARCE1, STK11, SUFU, TMEM127, TP53, TSC1, TSC2, VHL and XRCC2 (sequencing and deletion/duplication); CASR, CFTR, CPA1, CTRC, EGFR, EGLN1, FAM175A, HOXB13, KIT, MITF, MLH3, PALLD, PDGFRA, POLD1, POLE, PRSS1, RINT1, RPS20, SPINK1 and TERT (sequencing only); EPCAM and GREM1 (deletion/duplication only). RNA data is  routinely analyzed for use in variant interpretation for all genes.  Based on Kristi Hunter's personal and family history of cancer, she meets medical criteria for genetic testing. Despite that she meets criteria, she may still have Hunter out of pocket cost. We discussed that if her out of pocket cost for testing is over $100, the laboratory will call to discuss self-pay options and patient pay assistance programs.   PLAN: After considering the risks, benefits, and limitations, Kristi Hunter provided informed consent to pursue genetic testing and the blood sample was sent to [facility] for analysis of the CustomNext-Cancer +RNAinsight Panel. Results should be available within approximately 3 weeks' time, at which point they will be disclosed by telephone to Kristi Hunter, as will any additional recommendations warranted by these results. Kristi Hunter will receive a summary of her genetic counseling visit and a copy of her results once available. This information will also be available in Epic.   Lastly, we encouraged Kristi Hunter to remain in contact with cancer genetics annually so that we can continuously update the family history and inform her of any changes in cancer genetics and testing that may be of benefit for this family.   Kristi Hunter questions were answered to her satisfaction today. Our contact information was provided should additional questions or concerns arise. Thank you for the referral and allowing [name] to share in the care of your patient.   Marquiz Sotelo M. Joette Catching, [place], [facility] Genetic Counselor [email or web address] (P) [phone]  The patient was seen for a total of 20 minutes in face-to-face genetic counseling.  The patient brought her daughter, Kristi Hunter, to her appointment. Drs. Magrinat, Lindi Adie and/or Burr Medico were available to discuss this case as needed.    _______________________________________________________________________ For Office Staff:  Number of people involved in session:  2 Was [facility] Intern/ student involved with case: no

## 2021-02-15 ENCOUNTER — Other Ambulatory Visit: Payer: Self-pay | Admitting: General Surgery

## 2021-02-15 ENCOUNTER — Encounter: Payer: Self-pay | Admitting: *Deleted

## 2021-02-15 ENCOUNTER — Telehealth: Payer: Self-pay | Admitting: Oncology

## 2021-02-15 ENCOUNTER — Telehealth: Payer: Self-pay | Admitting: *Deleted

## 2021-02-15 DIAGNOSIS — Z170 Estrogen receptor positive status [ER+]: Secondary | ICD-10-CM

## 2021-02-15 DIAGNOSIS — C50411 Malignant neoplasm of upper-outer quadrant of right female breast: Secondary | ICD-10-CM

## 2021-02-15 NOTE — Telephone Encounter (Signed)
Sch per 7/7 los, pt aware 

## 2021-02-15 NOTE — Telephone Encounter (Signed)
Spoke to pt concerning [facility] from 7.6.22. Denies questions or concerns regarding dx or treatment care plan. Pt eager to have sx and requesting sx date. Informed pt Dr. Marlowe Aschoff office will call with sx date and instructions. Received verbal understanding.

## 2021-02-15 NOTE — Progress Notes (Signed)
Thank you for sharing your assessment and plan with me. Larey Seat, MD

## 2021-02-16 ENCOUNTER — Other Ambulatory Visit (INDEPENDENT_AMBULATORY_CARE_PROVIDER_SITE_OTHER): Payer: Self-pay | Admitting: Family Medicine

## 2021-02-16 DIAGNOSIS — R7303 Prediabetes: Secondary | ICD-10-CM

## 2021-02-16 NOTE — Progress Notes (Addendum)
Surgical Instructions    Your procedure is scheduled on Tuesday July 12th.  Report to [facility] Main Entrance "A" at 08:30 A.M., then check in with the Admitting office.  Call this number if you have problems the morning of surgery:  [phone]   If you have any questions prior to your surgery date call [phone]: Open Monday-Friday 8am-4pm    Remember:  Do not eat after midnight the night before your surgery  You may drink clear liquids until 07:30 the morning of your surgery.   Clear liquids allowed are: Water, Non-Citrus Juices (without pulp), Carbonated Beverages, Clear Tea, Black Coffee Only, and Gatorade  Patient Instructions  The night before surgery:  No food after midnight. ONLY clear liquids after midnight  The day of surgery (if you have diabetes): Drink ONE (1) 12 oz G2 given to you in your pre admission testing appointment by 07:30 A.M. the morning of surgery. Drink in one sitting. Do not sip.  This drink was given to you during your hospital  pre-op appointment visit.  Nothing else to drink after completing the  12 oz bottle of G2.         If you have questions, please contact your surgeon's office.   Take these medicines the morning of surgery with A SIP OF WATER   buPROPion (WELLBUTRIN SR)  DO NOT TAKE metFORMIN (GLUCOPHAGE) the morning of surgery.   As of today, STOP taking any Aspirin (unless otherwise instructed by your surgeon) Aleve, Naproxen, Ibuprofen, Motrin, Advil, Goody's, BC's, all herbal medications, fish oil, and all vitamins.                     Do NOT Smoke (Tobacco/Vaping) or drink Alcohol 24 hours prior to your procedure.  If you use a CPAP at night, you may bring all equipment for your overnight stay.   Contacts, glasses, piercing's, hearing aid's, dentures or partials may not be worn into surgery, please bring cases for these belongings.    For patients admitted to the hospital, discharge time will be determined by your treatment  team.   Patients discharged the day of surgery will not be allowed to drive home, and someone needs to stay with them for 24 hours.  ONLY 1 SUPPORT PERSON MAY BE PRESENT WHILE YOU ARE IN SURGERY. IF YOU ARE TO BE ADMITTED ONCE YOU ARE IN YOUR ROOM YOU WILL BE ALLOWED TWO (2) VISITORS.  Minor children may have two parents present. Special consideration for safety and communication needs will be reviewed on a case by case basis.   Special instructions:   Kristi Hunter- Preparing For Surgery  Before surgery, you can play an important role. Because skin is not sterile, your skin needs to be as free of germs as possible. You can reduce the number of germs on your skin by washing with CHG (chlorahexidine gluconate) Soap before surgery.  CHG is an antiseptic cleaner which kills germs and bonds with the skin to continue killing germs even after washing.    Oral Hygiene is also important to reduce your risk of infection.  Remember - BRUSH YOUR TEETH THE MORNING OF SURGERY WITH YOUR REGULAR TOOTHPASTE  Please do not use if you have an allergy to CHG or antibacterial soaps. If your skin becomes reddened/irritated stop using the CHG.  Do not shave (including legs and underarms) for at least 48 hours prior to first CHG shower. It is OK to shave your face.  Please follow these instructions  carefully.   Shower the NIGHT BEFORE SURGERY and the MORNING OF SURGERY  If you chose to wash your hair, wash your hair first as usual with your normal shampoo.  After you shampoo, rinse your hair and body thoroughly to remove the shampoo.  Use CHG Soap as you would any other liquid soap. You can apply CHG directly to the skin and wash gently with a scrungie or a clean washcloth.   Apply the CHG Soap to your body ONLY FROM THE NECK DOWN.  Do not use on open wounds or open sores. Avoid contact with your eyes, ears, mouth and genitals (private parts). Wash Face and genitals (private parts)  with your normal soap.   Wash  thoroughly, paying special attention to the area where your surgery will be performed.  Thoroughly rinse your body with warm water from the neck down.  DO NOT shower/wash with your normal soap after using and rinsing off the CHG Soap.  Pat yourself dry with a CLEAN TOWEL.  Wear CLEAN PAJAMAS to bed the night before surgery  Place CLEAN SHEETS on your bed the night before your surgery  DO NOT SLEEP WITH PETS.   Day of Surgery: Shower with CHG soap. Do not wear jewelry, make up, nail polish, gel polish, artificial nails, or any other type of covering on natural nails including finger and toenails. If patients have artificial nails, gel coating, etc. that need to be removed by a nail salon please have this removed prior to surgery. Surgery may need to be canceled/delayed if the surgeon/ anesthesia feels like the patient is unable to be adequately monitored. Do not wear lotions, powders, perfumes/colognes, or deodorant. Do not shave 48 hours prior to surgery.  Men may shave face and neck. Do not bring valuables to the hospital. [facility] is not responsible for any belongings or valuables. Wear Clean/Comfortable clothing the morning of surgery Remember to brush your teeth WITH YOUR REGULAR TOOTHPASTE.   Please read over the following fact sheets that you were given.

## 2021-02-18 NOTE — Progress Notes (Signed)
Radiation Oncology         [phone] ________________________________  Name: Kristi Hunter MRN: [id number]  Date: 02/14/2021  DOB: 1941/04/18  NI:DPOEUM, Mortimer Fries, MD  Stark Klein, MD     REFERRING PHYSICIAN: Stark Klein, MD   DIAGNOSIS: The encounter diagnosis was Malignant neoplasm of upper-outer quadrant of right breast in female, estrogen receptor positive ([place]).   HISTORY OF PRESENT ILLNESS::Kristi Hunter is a 80 y.o. female who is seen for an initial consultation visit regarding the patient's diagnosis of right-sided breast cancer.  The patient was found to have a some areolar abnormality within the upper outer quadrant on mammogram.  Ultrasound revealed a 1.0 cm tumor.  No axillary adenopathy was seen on the right.  Biopsy was performed and this revealed a grade 2 invasive ductal carcinoma.  Receptor studies indicated that the tumor was estrogen receptor positive, progesterone receptor positive, and HER2/neu negative.  The Ki-67 was 20%.  The patient is felt to be a good candidate for breast conservation treatment and also possible Oncotype testing.  I have been asked to see the patient for consideration of adjuvant radiation treatment.    PREVIOUS RADIATION THERAPY: No   PAST MEDICAL HISTORY:  has a past medical history of Anemia, Anxiety, Arthritis, Back pain, Breast cancer ([place]), Cancer ([place]), Chest pain, Chicken pox, Chronic kidney disease, Constipation, Emphysema of lung ([place]), Family history of breast cancer (02/14/2021), Glaucoma, Hypertension, Joint pain, Lower extremity edema, Lymphedema, Osteoarthritis, Personal history of malignant neoplasm of uterus (02/14/2021), Prediabetes, Shortness of breath, Urine incontinence, Uterine cancer ([place]), UTI (urinary tract infection), Vertigo, and Vitamin B 12 deficiency.     PAST SURGICAL HISTORY: Past Surgical History:  Procedure Laterality Date   ABDOMINAL HYSTERECTOMY  2011   REPLACEMENT TOTAL KNEE Right 2012      FAMILY HISTORY: family history includes Alcohol abuse in her brother, brother, father, and paternal grandfather; Arthritis in her brother, maternal grandmother, and paternal grandfather; Asthma in her father; Breast cancer (age of onset: 20) in her paternal aunt; COPD in her father; Cancer in her brother, father, and mother; Cirrhosis in her paternal grandfather; Colon cancer in her maternal grandfather; Colon polyps in her daughter; Depression in her daughter and son; Diabetes in her maternal grandmother and mother; Drug abuse in her brother; Heart attack in her father and mother; Heart disease in her father and mother; Hyperlipidemia in her maternal grandmother and mother; Hypertension in her daughter and mother; Melanoma in her father and mother; Mental illness in her daughter; Miscarriages / Stillbirths in her mother; Obesity in her father and mother; Stroke in her mother; Thyroid disease in her mother.   SOCIAL HISTORY:  reports that she quit smoking about 38 years ago. Her smoking use included cigarettes. She has never used smokeless tobacco. She reports current alcohol use of about 1.0 standard drink of alcohol per week. She reports that she does not use drugs.   ALLERGIES: Percocet [oxycodone-acetaminophen]   MEDICATIONS:  Current Outpatient Medications  Medication Sig Dispense Refill   alendronate (FOSAMAX) 70 MG tablet Take 1 tablet (70 mg total) by mouth every 7 (seven) days. Take with a full glass of water on an empty stomach. (Patient taking differently: Take 70 mg by mouth every Friday. Take with a full glass of water on an empty stomach.) 4 tablet 11   aspirin 81 MG EC tablet Take 81 mg by mouth daily. Swallow whole.     buPROPion (WELLBUTRIN SR) 150 MG 12 hr tablet TAKE 1 TABLET (  150 MG TOTAL) BY MOUTH IN THE MORNING. 30 tablet 0   chlorthalidone (HYGROTON) 25 MG tablet TAKE 1 TABLET BY MOUTH EVERY DAY (Patient taking differently: Take 25 mg by mouth daily.) 90 tablet 1    Lactobacillus (PROBIOTIC ACIDOPHILUS PO) Take 1 capsule by mouth daily.     Melatonin 10 MG TABS Take 10 mg by mouth at bedtime as needed (sleep).     metFORMIN (GLUCOPHAGE) 500 MG tablet Take 1 tablet (500 mg total) by mouth 2 (two) times daily with a meal. 60 tablet 0   metroNIDAZOLE (METROGEL) 1 % gel Apply topically daily. (Patient taking differently: Apply 1 application topically daily as needed (rosacea).) 60 g 1   Multiple Vitamin (MULTIVITAMIN) tablet Take 1 tablet by mouth daily.     naproxen (NAPROSYN) 500 MG tablet Take one twice daily as needed for pain with food. (Patient taking differently: Take 500 mg by mouth 2 (two) times daily as needed for moderate pain. Take one twice daily as needed for pain with food.) 60 tablet 0   omega-3 fish oil (MAXEPA) 1000 MG CAPS capsule Take 1 capsule by mouth daily.      traZODone (DESYREL) 50 MG tablet Take 50 mg by mouth at bedtime as needed for sleep.     Turmeric 500 MG CAPS Take 500 mg by mouth daily as needed (pain).     No current facility-administered medications for this encounter.     REVIEW OF SYSTEMS:  A 15 point review of systems is documented in the electronic medical record. This was obtained by the nursing staff. However, I reviewed this with the patient to discuss relevant findings and make appropriate changes.  Pertinent items are noted in HPI.    PHYSICAL EXAM:  vitals were not taken for this visit.   ECOG = 0  0 - Asymptomatic (Fully active, able to carry on all predisease activities without restriction)  1 - Symptomatic but completely ambulatory (Restricted in physically strenuous activity but ambulatory and able to carry out work of a light or sedentary nature. For example, light housework, office work)  2 - Symptomatic, <50% in bed during the day (Ambulatory and capable of all self care but unable to carry out any work activities. Up and about more than 50% of waking hours)  3 - Symptomatic, >50% in bed, but not  bedbound (Capable of only limited self-care, confined to bed or chair 50% or more of waking hours)  4 - Bedbound (Completely disabled. Cannot carry on any self-care. Totally confined to bed or chair)  5 - Death   Eustace Pen MM, Creech RH, Tormey DC, et al. [phone]). "Toxicity and response criteria of the [facility]". [place] Oncol. 5 (6): 649-55  Alert, no distress   LABORATORY DATA:  Lab Results  Component Value Date   WBC 8.6 02/14/2021   HGB 12.8 02/14/2021   HCT 39.3 02/14/2021   MCV 85.6 02/14/2021   PLT 291 02/14/2021   Lab Results  Component Value Date   NA 141 02/14/2021   K 3.9 02/14/2021   CL 104 02/14/2021   CO2 31 02/14/2021   Lab Results  Component Value Date   ALT 21 02/14/2021   AST 17 02/14/2021   ALKPHOS 62 02/14/2021   BILITOT 0.3 02/14/2021      RADIOGRAPHY: US BREAST LTD UNI RIGHT INC AXILLA  Result Date: 02/01/2021 CLINICAL DATA:  Patient recalled from screening for right breast mass. EXAM: ULTRASOUND OF THE RIGHT BREAST  COMPARISON:  Previous exam(s). FINDINGS: Targeted ultrasound is performed, showing a 10 x 7 x 8 mm irregular hypoechoic mass right breast 10 o'clock position 1 cm from the nipple. No right axillary adenopathy. IMPRESSION: Suspicious right breast mass 10 o'clock position. RECOMMENDATION: Ultrasound-guided core needle biopsy right breast mass 10 o'clock position. I have discussed the findings and recommendations with the patient. If applicable, a reminder letter will be sent to the patient regarding the next appointment. BI-RADS CATEGORY  4: Suspicious. Electronically Signed   By: Lovey Newcomer M.D.   On: 02/01/2021 13:56  [name] LIMITED JOINT SPACE STRUCTURES LOW LEFT(NO LINKED CHARGES)  Result Date: 02/08/2021 Formatting of this result is different from the original. Procedure: Real-time Ultrasound Guided Injection of left knee superior lateral patellar space Device: Philips Affiniti 50G Images permanently stored and  available for review in PACS Verbal informed consent obtained.  Discussed risks and benefits of procedure. Warned about infection bleeding damage to structures skin hypopigmentation and fat atrophy among others. Patient expresses understanding and agreement Time-out conducted.   Noted no overlying erythema, induration, or other signs of local infection.   Skin prepped in a sterile fashion.   Local anesthesia: Topical Ethyl chloride.   With sterile technique and under real time ultrasound guidance: 40 mg of Kenalog and 2 mL of Marcaine injected into knee joint. Fluid seen entering the joint capsule.   Completed without difficulty   Pain immediately resolved suggesting accurate placement of the medication.   Advised to call if fevers/chills, erythema, induration, drainage, or persistent bleeding.   Images permanently stored and available for review in the ultrasound unit. Impression: Technically successful ultrasound guided injection.    MM CLIP PLACEMENT RIGHT  Result Date: 02/05/2021 CLINICAL DATA:  Evaluate post biopsy marker clip placement following ultrasound-guided core needle biopsy of a right breast mass. EXAM: 3D DIAGNOSTIC RIGHT MAMMOGRAM POST ULTRASOUND BIOPSY COMPARISON:  Previous exam(s). FINDINGS: 3D Mammographic images were obtained following ultrasound guided biopsy of a right breast mass. The biopsy marking clip is in expected position at the site of biopsy. IMPRESSION: Appropriate positioning of the ribbon shaped biopsy marking clip at the site of biopsy in the along the lateral margin of the mass and architectural distortion. Final Assessment: Post Procedure Mammograms for Marker Placement Electronically Signed   By: Lajean Manes M.D.   On: 02/05/2021 14:29  [name] RT BREAST BX W LOC DEV 1ST LESION IMG BX SPEC US GUIDE  Addendum Date: 02/06/2021   ADDENDUM REPORT: 02/06/2021 14:24 ADDENDUM: Pathology revealed GRADE II INVASIVE DUCTAL CARCINOMA WITH CALCIFICATIONS, INTERMEDIATE GRADE DUCTAL  CARCINOMA IN SITU of the Right breast, 10 o'clock, 1cmfn. This was found to be concordant by Dr. Lajean Manes. Pathology results were discussed with the patient by telephone. The patient reported doing well after the biopsy with minimal tenderness at the site. Post biopsy instructions and care were reviewed and questions were answered. The patient was encouraged to call [facility] for any additional concerns. My direct phone number was provided. The patient was referred to [facility] on February 14, 2021. Pathology results reported by Terie Purser, RN on 02/06/2021. Electronically Signed   By: Lajean Manes M.D.   On: 02/06/2021 14:24   Result Date: 02/06/2021 CLINICAL DATA:  Patient presents for ultrasound-guided core needle biopsy of an irregular right breast mass. EXAM: ULTRASOUND GUIDED RIGHT BREAST CORE NEEDLE BIOPSY COMPARISON:  Previous exam(s). PROCEDURE: I met with the  patient and we discussed the procedure of ultrasound-guided biopsy, including benefits and alternatives. We discussed the high likelihood of a successful procedure. We discussed the risks of the procedure, including infection, bleeding, tissue injury, clip migration, and inadequate sampling. Informed written consent was given. The usual time-out protocol was performed immediately prior to the procedure. Lesion quadrant: Upper outer quadrant Using sterile technique and 1% Lidocaine as local anesthetic, under direct ultrasound visualization, a 12 gauge spring-loaded device was used to perform biopsy of the 10 o'clock position irregular hypoechoic mass and associated architectural distortion using an inferior approach. At the conclusion of the procedure ribbon shaped tissue marker clip was deployed into the biopsy cavity. Follow up 2 view mammogram was performed and dictated separately. IMPRESSION: Ultrasound guided biopsy of a right breast  mass. No apparent complications. Electronically Signed: By: Lajean Manes M.D. On: 02/05/2021 14:12  DG Knee AP/LAT W/[place] Left  Result Date: 01/26/2021 CLINICAL DATA:  Left knee pain EXAM: LEFT KNEE 3 VIEWS COMPARISON:  None. FINDINGS: No evidence of fracture, dislocation, or joint effusion. There is mild patellofemoral osteoarthrosis. Soft tissues are unremarkable. IMPRESSION: Mild patellofemoral osteoarthrosis. Electronically Signed   By: Ulyses Jarred M.D.   On: 01/26/2021 01:23       IMPRESSION/ PLAN:  The patient has a new diagnosis of invasive ductal carcinoma of the right breast, T1 N0 M0.  She is felt to be a good candidate for breast conservation treatment.  I discussed with the patient the potential role of adjuvant radiation treatment subsequently.  We did discuss that she does have a very favorable tumor and we can make a final decision after her final pathology is known.  There may be an option depending on her wishes to forego radiation treatment.  I did discuss with her the logistics of possible treatment.  We also discussed possible side effects and risks in addition to the improvement in local control.  I look forward to seeing her postoperatively to further evaluate her case at that time.  The patient was seen in person today in clinic.  The total time spent on the patient's visit today was 45 minutes, including chart review, direct discussion/evaluation with the patient, and coordination of care.        ________________________________   Jodelle Gross, MD, PhD   **Disclaimer: This note was dictated with voice recognition software. Similar sounding words can inadvertently be transcribed and this note may contain transcription errors which may not have been corrected upon publication of note.**

## 2021-02-19 ENCOUNTER — Ambulatory Visit
Admission: RE | Admit: 2021-02-19 | Discharge: 2021-02-19 | Disposition: A | Discharge status: Home / Self Care | Payer: Medicare HMO | Source: Ambulatory Visit | Attending: General Surgery | Admitting: General Surgery

## 2021-02-19 ENCOUNTER — Encounter (HOSPITAL_COMMUNITY): Payer: Self-pay

## 2021-02-19 ENCOUNTER — Encounter (HOSPITAL_COMMUNITY)
Admission: RE | Admit: 2021-02-19 | Discharge: 2021-02-19 | Disposition: A | Discharge status: Home / Self Care | Payer: Medicare HMO | Source: Ambulatory Visit | Attending: General Surgery | Admitting: General Surgery

## 2021-02-19 ENCOUNTER — Other Ambulatory Visit: Payer: Self-pay

## 2021-02-19 DIAGNOSIS — Z01818 Encounter for other preprocedural examination: Secondary | ICD-10-CM | POA: Diagnosis not present

## 2021-02-19 DIAGNOSIS — C50911 Malignant neoplasm of unspecified site of right female breast: Secondary | ICD-10-CM | POA: Diagnosis not present

## 2021-02-19 DIAGNOSIS — Z170 Estrogen receptor positive status [ER+]: Secondary | ICD-10-CM

## 2021-02-19 DIAGNOSIS — C50411 Malignant neoplasm of upper-outer quadrant of right female breast: Secondary | ICD-10-CM

## 2021-02-19 HISTORY — DX: Prediabetes: R73.03

## 2021-02-19 HISTORY — DX: Sleep apnea, unspecified: G47.30

## 2021-02-19 LAB — GLUCOSE, CAPILLARY: Glucose-Capillary: 108 mg/dL — ABNORMAL HIGH (ref 70–99)

## 2021-02-19 LAB — HEMOGLOBIN A1C
Hgb A1c MFr Bld: 5.8 % — ABNORMAL HIGH (ref 4.8–5.6)
Mean Plasma Glucose: 119.76 mg/dL

## 2021-02-19 NOTE — Progress Notes (Addendum)
Surgical Instructions    Your procedure is scheduled on Tuesday July 12th.  Report to [facility] Main Entrance "A" at 08:30 A.M., then check in with the Admitting office.  Call this number if you have problems the morning of surgery:  [phone]   If you have any questions prior to your surgery date call [phone]: Open Monday-Friday 8am-4pm    Remember:  Do not eat after midnight the night before your surgery  You may drink clear liquids until 07:30 the morning of your surgery.   Clear liquids allowed are: Water, Non-Citrus Juices (without pulp), Carbonated Beverages, Clear Tea, Black Coffee Only, and Gatorade  Patient Instructions  The night before surgery:  No food after midnight. ONLY clear liquids after midnight  The day of surgery (if you have diabetes): Drink ONE (1) 12 oz G2 given to you in your pre admission testing appointment by 07:30 A.M. the morning of surgery. Drink in one sitting. Do not sip.  This drink was given to you during your hospital  pre-op appointment visit.  Nothing else to drink after completing the  12 oz bottle of G2.         If you have questions, please contact your surgeon's office.   Take these medicines the morning of surgery with A SIP OF WATER   buPROPion (WELLBUTRIN SR)  DO NOT TAKE metFORMIN (GLUCOPHAGE) the morning of surgery.   As of today, STOP taking any Aspirin (unless otherwise instructed by your surgeon) Aleve, Naproxen, Ibuprofen, Motrin, Advil, Goody's, BC's, all herbal medications, fish oil, and all vitamins.     Do not take oral diabetes medicines (pills) the morning of surgery.        Do NOT Smoke (Tobacco/Vaping) or drink Alcohol 24 hours prior to your procedure.  If you use a CPAP at night, you may bring all equipment for your overnight stay.   Contacts, glasses, piercing's, hearing aid's, dentures or partials may not be worn into surgery, please bring cases for these belongings.    For patients admitted to  the hospital, discharge time will be determined by your treatment team.   Patients discharged the day of surgery will not be allowed to drive home, and someone needs to stay with them for 24 hours.  ONLY 1 SUPPORT PERSON MAY BE PRESENT WHILE YOU ARE IN SURGERY. IF YOU ARE TO BE ADMITTED ONCE YOU ARE IN YOUR ROOM YOU WILL BE ALLOWED TWO (2) VISITORS.  Minor children may have two parents present. Special consideration for safety and communication needs will be reviewed on a case by case basis.   Special instructions:   [place]- Preparing For Surgery  Before surgery, you can play an important role. Because skin is not sterile, your skin needs to be as free of germs as possible. You can reduce the number of germs on your skin by washing with CHG (chlorahexidine gluconate) Soap before surgery.  CHG is an antiseptic cleaner which kills germs and bonds with the skin to continue killing germs even after washing.    Oral Hygiene is also important to reduce your risk of infection.  Remember - BRUSH YOUR TEETH THE MORNING OF SURGERY WITH YOUR REGULAR TOOTHPASTE  Please do not use if you have an allergy to CHG or antibacterial soaps. If your skin becomes reddened/irritated stop using the CHG.  Do not shave (including legs and underarms) for at least 48 hours prior to first CHG shower. It is OK to shave your face.  Please follow  these instructions carefully.   Shower the NIGHT BEFORE SURGERY and the MORNING OF SURGERY  If you chose to wash your hair, wash your hair first as usual with your normal shampoo.  After you shampoo, rinse your hair and body thoroughly to remove the shampoo.  Use CHG Soap as you would any other liquid soap. You can apply CHG directly to the skin and wash gently with a scrungie or a clean washcloth.   Apply the CHG Soap to your body ONLY FROM THE NECK DOWN.  Do not use on open wounds or open sores. Avoid contact with your eyes, ears, mouth and genitals (private parts). Wash  Face and genitals (private parts)  with your normal soap.   Wash thoroughly, paying special attention to the area where your surgery will be performed.  Thoroughly rinse your body with warm water from the neck down.  DO NOT shower/wash with your normal soap after using and rinsing off the CHG Soap.  Pat yourself dry with a CLEAN TOWEL.  Wear CLEAN PAJAMAS to bed the night before surgery  Place CLEAN SHEETS on your bed the night before your surgery  DO NOT SLEEP WITH PETS.   Day of Surgery: Shower with CHG soap. Do not wear jewelry, make up, nail polish, gel polish, artificial nails, or any other type of covering on natural nails including finger and toenails. If patients have artificial nails, gel coating, etc. that need to be removed by a nail salon please have this removed prior to surgery. Surgery may need to be canceled/delayed if the surgeon/ anesthesia feels like the patient is unable to be adequately monitored. Do not wear lotions, powders, perfumes/colognes, or deodorant. Do not shave 48 hours prior to surgery.  Men may shave face and neck. Do not bring valuables to the hospital. [facility] is not responsible for any belongings or valuables. Wear Clean/Comfortable clothing the morning of surgery Remember to brush your teeth WITH YOUR REGULAR TOOTHPASTE.   Please read over the following fact sheets that you were given.

## 2021-02-19 NOTE — H&P (Signed)
PROVIDER:  Georgianne Fick, MD   MRN: [id number] DOB: [birth year] DATE OF ENCOUNTER: 02/14/2021   Subjective    Chief Complaint: breast cancer      History of Present Illness: Kristi Hunter is a 80 y.o. female who is seen today as an office consultation at the request of Dr. Dr. Florene Glen for evaluation of right breast cancer. The patient presented with screening detected right breast mass. Diagnostic imaging was performed that showed a 1 cm mass at 10 o'clock on the right.    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   She has personal history of uterine cancer tx with hysterectomy 2011.  She has family history of colon cancer in her maternal grandfather, breast cancer in her paternal aunt, and melanoma on both sides.     Menarche was age 28, menopause age 34. She is a G3P3 with first child at age 57.  She used hormonal contraception for around 10 years.     Review of Systems: A complete review of systems was obtained from the patient.  I have reviewed this information and discussed as appropriate with the patient.  See HPI as well for other ROS.   Review of Systems  Constitutional: Diaphoresis: rosacea.       Difficulty sleeping  Respiratory: Positive for shortness of breath (when walking up stairs, uses 2 pillows to sleep).   Genitourinary:       Incontinence  Musculoskeletal: Positive for joint pain (knee pain).  Skin: Positive for rash.  All other systems reviewed and are negative.       Medical History: Past Medical History      Past Medical History:  Diagnosis Date   Diabetes mellitus without complication (CMS-HCC)     Incontinence in female     Malignant neoplasm of upper-outer quadrant of right breast in female, estrogen receptor positive (CMS-HCC) 02/14/2021   Obesity             Patient Active Problem List  Diagnosis   Malignant neoplasm of upper-outer quadrant of right breast in  female, estrogen receptor positive (CMS-HCC)   Family history of cancer      Past Surgical History       Past Surgical History:  Procedure Laterality Date   HYSTERECTOMY   2011    for uterine cancer   JOINT REPLACEMENT Right 2013    right knee        Allergies  Not on File     No current outpatient medications on file prior to visit.    No current facility-administered medications on file prior to visit.      Family History       Family History  Problem Relation Age of Onset   Breast cancer Paternal Aunt     Colon cancer Maternal Grandfather     Melanoma Other          Social History        Tobacco Use  Smoking Status Former Smoker   Packs/day: 0.75   Types: Cigarettes   Quit date: 1985   Years since quitting: 37.5  Smokeless Tobacco Not on file      Social History  Social History         Socioeconomic History   Marital status: Married  Occupational History   Occupation: Tourist information centre manager  Tobacco Use   Smoking status: Former Smoker      Packs/day: 0.75  Types: Cigarettes      Quit date: 37      Years since quitting: 37.5  Substance and Sexual Activity   Alcohol use: Yes      Alcohol/week: 4.0 standard drinks      Types: 4 Glasses of wine per week   Drug use: Never        Objective:         Vitals:    02/14/21 1150  BP: (!) 142/67  Pulse: 77  Temp: 36.4 C (97.6 F)  Weight: (!) 116.1 kg (255 lb 14.4 oz)  Height: 160 cm (5\' 3" )    Body mass index is 45.33 kg/m.   Physical Exam Constitutional:      General: She is not in acute distress.    Appearance: Normal appearance. She is obese. She is not ill-appearing.  HENT:     Head: Normocephalic and atraumatic.     Right Ear: External ear normal.     Left Ear: External ear normal.  Eyes:     General: No scleral icterus.       Right eye: No discharge.        Left eye: No discharge.     Extraocular Movements: Extraocular movements intact.     Conjunctiva/sclera: Conjunctivae normal.      Pupils: Pupils are equal, round, and reactive to light.  Cardiovascular:     Rate and Rhythm: Normal rate and regular rhythm.     Pulses: Normal pulses.     Heart sounds: Normal heart sounds. No murmur heard. Pulmonary:     Effort: Pulmonary effort is normal. No respiratory distress.     Breath sounds: Normal breath sounds. No stridor. No wheezing or rhonchi.  Chest:     Chest wall: Lacerations present.  Breasts: Breasts are symmetrical.     Right: Tenderness (mild tenderness at biopsy site) present. No swelling, bleeding, inverted nipple, mass, nipple discharge, skin change, axillary adenopathy or supraclavicular adenopathy.     Left: Absent. No swelling, bleeding, inverted nipple, mass, nipple discharge, skin change, tenderness, axillary adenopathy or supraclavicular adenopathy.         Comments: Cancer site 10 o'clock 1 cm, non palpable Abdominal:     General: Abdomen is flat. Bowel sounds are normal.     Palpations: Abdomen is soft.  Musculoskeletal:        General: No swelling, tenderness or deformity.     Cervical back: Normal range of motion and neck supple. No rigidity or tenderness.     Right lower leg: No edema.     Left lower leg: No edema.  Lymphadenopathy:     Cervical: No cervical adenopathy.     Upper Body:     Right upper body: No supraclavicular, axillary or pectoral adenopathy.     Left upper body: No supraclavicular, axillary or pectoral adenopathy.  Skin:    General: Skin is warm and dry.     Capillary Refill: Capillary refill takes 2 to 3 seconds.     Coloration: Skin is not jaundiced or pale.     Findings: No bruising, erythema or lesion.  Neurological:     General: No focal deficit present.     Mental Status: She is alert and oriented to person, place, and time.     Cranial Nerves: No cranial nerve deficit.     Sensory: No sensory deficit.     Motor: No weakness.     Gait: Abnormal gait: antalgic gait.  Psychiatric:  Mood and Affect: Mood  normal.        Behavior: Behavior normal.        Thought Content: Thought content normal.        Judgment: Judgment normal.              CBC and CMET essentially normal 02/14/21   Assessment and Plan:  Diagnoses and all orders for this visit:   Malignant neoplasm of upper-inner quadrant of right breast in female, estrogen receptor positive (CMS-HCC)   Malignant neoplasm of upper-outer quadrant of right breast in female, estrogen receptor positive (CMS-HCC) Assessment & Plan: Pt with new diagnosis of cT1c right breast cancer on the right.  She is a good candidate for a lumpectomy.  Due to age, she might not need radiation depending on final pathology.  Also, this might or might not be followed by oncotype.  She would then need antiestrogen tx.     I will plan right seed localized lumpectomy.  We will hold on sentinel node biopsy given size of tumor and favorable prognostic profile.     I discussed surgery with patient and daughter.  I reviewed risks of bleeding, infection, damage to adjacent structures, pain, possible need for additional surgery or procedures, seroma, and more.  She would like to proceed as soon as possible.       Family history of cancer Assessment & Plan: Genetic testing being performed.

## 2021-02-19 NOTE — Telephone Encounter (Signed)
Pt last seen by Dr. Beasley.  

## 2021-02-19 NOTE — Progress Notes (Signed)
PCP - Libby Maw, MD Cardiologist - Dr. London Sheer [facility]  PPM/ICD - denies Device Orders -  Rep Notified -   Chest x-ray - none EKG - 02/19/21 Stress Test - no results found in Care Everywhere.Records requested from [facility].  ECHO - no Cardiac Cath - no results found in Care Everywhere. Records requested from [facility]. Pt reports the cath was "clean".  Sleep Study - August 2021 CPAP - "uses 6 out of 7 nights."  Fasting Blood Sugar - does not check blood sugar;does not have meter Checks Blood Sugar _____ times a day  Blood Thinner Instructions:n/a Aspirin Instructions:last dose 02/19/21  ERAS Protcol -clear liquids until 0730 PRE-SURGERY Ensure or G2- G2  COVID TEST- n/a, ambulatory patient   Anesthesia review: yes,seed patient  Patient denies shortness of breath, fever, cough and chest pain at PAT appointment   All instructions explained to the patient, with a verbal understanding of the material. Patient agrees to go over the instructions while at home for a better understanding. Patient also instructed to self quarantine after being tested for COVID-19. The opportunity to ask questions was provided.

## 2021-02-20 ENCOUNTER — Ambulatory Visit (HOSPITAL_COMMUNITY): Payer: Medicare HMO | Admitting: Certified Registered"

## 2021-02-20 ENCOUNTER — Encounter (HOSPITAL_COMMUNITY)
Admission: RE | Disposition: A | Discharge status: Home / Self Care | Payer: Self-pay | Source: Home / Self Care | Attending: General Surgery

## 2021-02-20 ENCOUNTER — Ambulatory Visit (HOSPITAL_COMMUNITY): Payer: Medicare HMO | Admitting: Physician Assistant

## 2021-02-20 ENCOUNTER — Ambulatory Visit (HOSPITAL_COMMUNITY)
Admission: RE | Admit: 2021-02-20 | Discharge: 2021-02-20 | Disposition: A | Discharge status: Home / Self Care | Payer: Medicare HMO | Attending: General Surgery | Admitting: General Surgery

## 2021-02-20 ENCOUNTER — Other Ambulatory Visit: Payer: Self-pay

## 2021-02-20 ENCOUNTER — Ambulatory Visit
Admission: RE | Admit: 2021-02-20 | Discharge: 2021-02-20 | Disposition: A | Discharge status: Home / Self Care | Payer: Medicare HMO | Source: Ambulatory Visit | Attending: General Surgery | Admitting: General Surgery

## 2021-02-20 DIAGNOSIS — Z8542 Personal history of malignant neoplasm of other parts of uterus: Secondary | ICD-10-CM | POA: Insufficient documentation

## 2021-02-20 DIAGNOSIS — C50411 Malignant neoplasm of upper-outer quadrant of right female breast: Secondary | ICD-10-CM | POA: Diagnosis not present

## 2021-02-20 DIAGNOSIS — Z90710 Acquired absence of both cervix and uterus: Secondary | ICD-10-CM | POA: Insufficient documentation

## 2021-02-20 DIAGNOSIS — C50911 Malignant neoplasm of unspecified site of right female breast: Secondary | ICD-10-CM | POA: Diagnosis not present

## 2021-02-20 DIAGNOSIS — Z87891 Personal history of nicotine dependence: Secondary | ICD-10-CM | POA: Insufficient documentation

## 2021-02-20 DIAGNOSIS — Z800 Family history of malignant neoplasm of digestive organs: Secondary | ICD-10-CM | POA: Diagnosis not present

## 2021-02-20 DIAGNOSIS — Z96651 Presence of right artificial knee joint: Secondary | ICD-10-CM | POA: Diagnosis not present

## 2021-02-20 DIAGNOSIS — C50211 Malignant neoplasm of upper-inner quadrant of right female breast: Secondary | ICD-10-CM | POA: Insufficient documentation

## 2021-02-20 DIAGNOSIS — Z170 Estrogen receptor positive status [ER+]: Secondary | ICD-10-CM

## 2021-02-20 DIAGNOSIS — Z803 Family history of malignant neoplasm of breast: Secondary | ICD-10-CM | POA: Insufficient documentation

## 2021-02-20 DIAGNOSIS — E782 Mixed hyperlipidemia: Secondary | ICD-10-CM | POA: Diagnosis not present

## 2021-02-20 DIAGNOSIS — E559 Vitamin D deficiency, unspecified: Secondary | ICD-10-CM | POA: Diagnosis not present

## 2021-02-20 HISTORY — PX: BREAST LUMPECTOMY WITH RADIOACTIVE SEED LOCALIZATION: SHX6424

## 2021-02-20 LAB — GLUCOSE, CAPILLARY: Glucose-Capillary: 108 mg/dL — ABNORMAL HIGH (ref 70–99)

## 2021-02-20 SURGERY — BREAST LUMPECTOMY WITH RADIOACTIVE SEED LOCALIZATION
Anesthesia: General | Site: Breast | Laterality: Right

## 2021-02-20 MED ORDER — ORAL CARE MOUTH RINSE: 15.0000 mL | Freq: Once | OROMUCOSAL | Status: AC

## 2021-02-20 MED ORDER — ACETAMINOPHEN 500 MG PO TABS
ORAL_TABLET | ORAL | Status: AC
  Administered 2021-02-20: 1000 mg via ORAL
  Filled 2021-02-20: qty 2

## 2021-02-20 MED ORDER — OXYCODONE HCL 5 MG PO TABS: 5.0000 mg | ORAL_TABLET | Freq: Once | ORAL | Status: DC | PRN

## 2021-02-20 MED ORDER — LACTATED RINGERS IV SOLN: INTRAVENOUS | Status: DC

## 2021-02-20 MED ORDER — FENTANYL CITRATE (PF) 100 MCG/2ML IJ SOLN
INTRAMUSCULAR | Status: DC | PRN
  Administered 2021-02-20 (×3): 25 ug via INTRAVENOUS

## 2021-02-20 MED ORDER — CHLORHEXIDINE GLUCONATE CLOTH 2 % EX PADS: 6.0000 | MEDICATED_PAD | Freq: Once | CUTANEOUS | Status: DC

## 2021-02-20 MED ORDER — CHLORHEXIDINE GLUCONATE 0.12 % MT SOLN: 15.0000 mL | Freq: Once | OROMUCOSAL | Status: AC

## 2021-02-20 MED ORDER — DEXAMETHASONE SODIUM PHOSPHATE 10 MG/ML IJ SOLN
INTRAMUSCULAR | Status: AC
  Filled 2021-02-20: qty 1

## 2021-02-20 MED ORDER — FENTANYL CITRATE (PF) 100 MCG/2ML IJ SOLN: 25.0000 ug | INTRAMUSCULAR | Status: DC | PRN

## 2021-02-20 MED ORDER — CEFAZOLIN SODIUM-DEXTROSE 2-4 GM/100ML-% IV SOLN
INTRAVENOUS | Status: AC
  Filled 2021-02-20: qty 100

## 2021-02-20 MED ORDER — 0.9 % SODIUM CHLORIDE (POUR BTL) OPTIME
TOPICAL | Status: DC | PRN
  Administered 2021-02-20: 1000 mL

## 2021-02-20 MED ORDER — OXYCODONE HCL 5 MG/5ML PO SOLN: 5.0000 mg | Freq: Once | ORAL | Status: DC | PRN

## 2021-02-20 MED ORDER — ONDANSETRON HCL 4 MG/2ML IJ SOLN
INTRAMUSCULAR | Status: DC | PRN
  Administered 2021-02-20: 4 mg via INTRAVENOUS

## 2021-02-20 MED ORDER — DEXAMETHASONE SODIUM PHOSPHATE 4 MG/ML IJ SOLN
INTRAMUSCULAR | Status: DC | PRN
  Administered 2021-02-20: 4 mg via INTRAVENOUS

## 2021-02-20 MED ORDER — LIDOCAINE HCL 1 % IJ SOLN
INTRAMUSCULAR | Status: AC
  Filled 2021-02-20: qty 20

## 2021-02-20 MED ORDER — ENSURE PRE-SURGERY PO LIQD: 296.0000 mL | Freq: Once | ORAL | Status: DC

## 2021-02-20 MED ORDER — FENTANYL CITRATE (PF) 100 MCG/2ML IJ SOLN
INTRAMUSCULAR | Status: AC
  Filled 2021-02-20: qty 2

## 2021-02-20 MED ORDER — PROPOFOL 10 MG/ML IV BOLUS
INTRAVENOUS | Status: DC | PRN
  Administered 2021-02-20: 150 mg via INTRAVENOUS

## 2021-02-20 MED ORDER — LIDOCAINE 2% (20 MG/ML) 5 ML SYRINGE
INTRAMUSCULAR | Status: DC | PRN
  Administered 2021-02-20: 100 mg via INTRAVENOUS

## 2021-02-20 MED ORDER — TRAMADOL HCL 50 MG PO TABS: 50.0000 mg | ORAL_TABLET | Freq: Four times a day (QID) | ORAL | 0 refills | Status: DC | PRN

## 2021-02-20 MED ORDER — PROPOFOL 10 MG/ML IV BOLUS
INTRAVENOUS | Status: AC
  Filled 2021-02-20: qty 20

## 2021-02-20 MED ORDER — BUPIVACAINE-EPINEPHRINE (PF) 0.25% -1:200000 IJ SOLN
INTRAMUSCULAR | Status: AC
  Filled 2021-02-20: qty 30

## 2021-02-20 MED ORDER — ONDANSETRON HCL 4 MG/2ML IJ SOLN: 4.0000 mg | Freq: Once | INTRAMUSCULAR | Status: DC | PRN

## 2021-02-20 MED ORDER — AMISULPRIDE (ANTIEMETIC) 5 MG/2ML IV SOLN
10.0000 mg | Freq: Once | INTRAVENOUS | Status: DC | PRN
Start: 2021-02-20 — End: 2021-02-20

## 2021-02-20 MED ORDER — CEFAZOLIN SODIUM-DEXTROSE 2-4 GM/100ML-% IV SOLN
2.0000 g | INTRAVENOUS | Status: AC
  Administered 2021-02-20: 2 g via INTRAVENOUS

## 2021-02-20 MED ORDER — CHLORHEXIDINE GLUCONATE 0.12 % MT SOLN
OROMUCOSAL | Status: AC
  Administered 2021-02-20: 15 mL via OROMUCOSAL
  Filled 2021-02-20: qty 15

## 2021-02-20 MED ORDER — LIDOCAINE HCL 1 % IJ SOLN
INTRAMUSCULAR | Status: DC | PRN
  Administered 2021-02-20: 40 mL via INTRAMUSCULAR

## 2021-02-20 MED ORDER — ACETAMINOPHEN 500 MG PO TABS: 1000.0000 mg | ORAL_TABLET | ORAL | Status: AC

## 2021-02-20 SURGICAL SUPPLY — 46 items
BAG COUNTER SPONGE SURGICOUNT (BAG) ×2 IMPLANT
BINDER BREAST 3XL (GAUZE/BANDAGES/DRESSINGS) ×2 IMPLANT
BINDER BREAST LRG (GAUZE/BANDAGES/DRESSINGS) IMPLANT
BINDER BREAST XLRG (GAUZE/BANDAGES/DRESSINGS) IMPLANT
BLADE SURG 10 STRL SS (BLADE) ×2 IMPLANT
CANISTER SUCT 3000ML PPV (MISCELLANEOUS) IMPLANT
CHLORAPREP W/TINT 26 (MISCELLANEOUS) ×2 IMPLANT
CLIP VESOCCLUDE LG 6/CT (CLIP) ×2 IMPLANT
CLIP VESOCCLUDE MED 6/CT (CLIP) ×2 IMPLANT
CLSR STERI-STRIP ANTIMIC 1/2X4 (GAUZE/BANDAGES/DRESSINGS) ×2 IMPLANT
COVER PROBE W GEL 5X96 (DRAPES) ×2 IMPLANT
COVER SURGICAL LIGHT HANDLE (MISCELLANEOUS) ×2 IMPLANT
DERMABOND ADVANCED (GAUZE/BANDAGES/DRESSINGS) ×1
DERMABOND ADVANCED .7 DNX12 (GAUZE/BANDAGES/DRESSINGS) ×1 IMPLANT
DEVICE DUBIN SPECIMEN MAMMOGRA (MISCELLANEOUS) ×2 IMPLANT
DRAPE CHEST BREAST 15X10 FENES (DRAPES) ×2 IMPLANT
DRSG PAD ABDOMINAL 8X10 ST (GAUZE/BANDAGES/DRESSINGS) ×2 IMPLANT
ELECT COATED BLADE 2.86 ST (ELECTRODE) ×2 IMPLANT
ELECT REM PT RETURN 9FT ADLT (ELECTROSURGICAL) ×2
ELECTRODE REM PT RTRN 9FT ADLT (ELECTROSURGICAL) ×1 IMPLANT
GAUZE 4X4 16PLY ~~LOC~~+RFID DBL (SPONGE) ×2 IMPLANT
GAUZE SPONGE 4X4 12PLY STRL (GAUZE/BANDAGES/DRESSINGS) ×2 IMPLANT
GAUZE SPONGE 4X4 12PLY STRL LF (GAUZE/BANDAGES/DRESSINGS) ×2 IMPLANT
GLOVE SURG ENC MOIS LTX SZ6 (GLOVE) ×2 IMPLANT
GLOVE SURG UNDER LTX SZ6.5 (GLOVE) ×2 IMPLANT
GOWN STRL REUS W/ TWL LRG LVL3 (GOWN DISPOSABLE) ×1 IMPLANT
GOWN STRL REUS W/TWL 2XL LVL3 (GOWN DISPOSABLE) ×2 IMPLANT
GOWN STRL REUS W/TWL LRG LVL3 (GOWN DISPOSABLE) ×1
KIT BASIN OR (CUSTOM PROCEDURE TRAY) ×2 IMPLANT
KIT MARKER MARGIN INK (KITS) ×2 IMPLANT
LIGHT WAVEGUIDE WIDE FLAT (MISCELLANEOUS) IMPLANT
NEEDLE HYPO 25GX1X1/2 BEV (NEEDLE) ×2 IMPLANT
NS IRRIG 1000ML POUR BTL (IV SOLUTION) IMPLANT
PACK GENERAL/GYN (CUSTOM PROCEDURE TRAY) ×2 IMPLANT
PAD ABD 8X10 STRL (GAUZE/BANDAGES/DRESSINGS) ×2 IMPLANT
SPONGE T-LAP 18X18 ~~LOC~~+RFID (SPONGE) ×2 IMPLANT
STRIP CLOSURE SKIN 1/2X4 (GAUZE/BANDAGES/DRESSINGS) ×2 IMPLANT
SUT MNCRL AB 4-0 PS2 18 (SUTURE) ×2 IMPLANT
SUT SILK 2 0 SH (SUTURE) IMPLANT
SUT VIC AB 2-0 SH 27 (SUTURE) ×1
SUT VIC AB 2-0 SH 27XBRD (SUTURE) ×1 IMPLANT
SUT VIC AB 3-0 SH 27 (SUTURE) ×1
SUT VIC AB 3-0 SH 27X BRD (SUTURE) ×1 IMPLANT
SYR CONTROL 10ML LL (SYRINGE) ×2 IMPLANT
TOWEL GREEN STERILE (TOWEL DISPOSABLE) ×2 IMPLANT
TOWEL GREEN STERILE FF (TOWEL DISPOSABLE) ×2 IMPLANT

## 2021-02-20 NOTE — Telephone Encounter (Signed)
Patient is requesting a refill of the following medications: Requested Prescriptions   Pending Prescriptions Disp Refills   metFORMIN (GLUCOPHAGE) 500 MG tablet [Pharmacy Med Name: METFORMIN HCL 500 MG TABLET] 60 tablet 0    Sig: TAKE 1 TABLET BY MOUTH 2 TIMES DAILY WITH A MEAL.    Last office visit: 01/18/21 Date of last refill: 01/18/21 Last refill amount: 60 Follow up time period per chart: 4 week Next appt:02/22/21

## 2021-02-20 NOTE — Anesthesia Preprocedure Evaluation (Signed)
Anesthesia Evaluation  Patient identified by MRN, date of birth, ID band Patient awake    Reviewed: Allergy & Precautions, NPO status , Patient's Chart, lab work & pertinent test results  History of Anesthesia Complications Negative for: history of anesthetic complications  Airway Mallampati: IV  TM Distance: <3 FB Neck ROM: Full    Dental  (+) Dental Advisory Given, Teeth Intact   Pulmonary sleep apnea and Continuous Positive Airway Pressure Ventilation , COPD, former smoker,    Pulmonary exam normal        Cardiovascular hypertension, Normal cardiovascular exam     Neuro/Psych negative neurological ROS     GI/Hepatic negative GI ROS, Neg liver ROS,   Endo/Other  diabetes (pre-dm)Morbid obesity  Renal/GU Renal disease  negative genitourinary   Musculoskeletal  (+) Arthritis ,   Abdominal   Peds  Hematology negative hematology ROS (+)   Anesthesia Other Findings  R breast cancer, h/o uterine cancer  Reproductive/Obstetrics                           Anesthesia Physical Anesthesia Plan  ASA: 3  Anesthesia Plan: General   Post-op Pain Management:    Induction: Intravenous  PONV Risk Score and Plan: 3 and Ondansetron, Dexamethasone, Midazolam and Treatment may vary due to age or medical condition  Airway Management Planned: LMA  Additional Equipment: None  Intra-op Plan:   Post-operative Plan: Extubation in OR  Informed Consent: I have reviewed the patients History and Physical, chart, labs and discussed the procedure including the risks, benefits and alternatives for the proposed anesthesia with the patient or authorized representative who has indicated his/her understanding and acceptance.     Dental advisory given  Plan Discussed with:   Anesthesia Plan Comments:        Anesthesia Quick Evaluation

## 2021-02-20 NOTE — Anesthesia Procedure Notes (Signed)
Procedure Name: LMA Insertion Date/Time: 02/20/2021 10:35 AM Performed by: Annamary Carolin, CRNA Pre-anesthesia Checklist: Patient identified, Emergency Drugs available, Suction available and Patient being monitored Patient Re-evaluated:Patient Re-evaluated prior to induction Oxygen Delivery Method: Circle System Utilized Preoxygenation: Pre-oxygenation with 100% oxygen Induction Type: IV induction Ventilation: Mask ventilation without difficulty LMA: LMA inserted LMA Size: 4.0 Number of attempts: 1 (with ease) Airway Equipment and Method: Bite block Placement Confirmation: positive ETCO2 Tube secured with: Tape Dental Injury: Teeth and Oropharynx as per pre-operative assessment

## 2021-02-20 NOTE — Transfer of Care (Signed)
Immediate Anesthesia Transfer of Care Note  Patient: Kristi Hunter  Procedure(s) Performed: RIGHT BREAST LUMPECTOMY WITH RADIOACTIVE SEED LOCALIZATION (Right: Breast)  Patient Location: PACU  Anesthesia Type:General  Level of Consciousness: drowsy  Airway & Oxygen Therapy: Patient Spontanous Breathing and Patient connected to face mask oxygen  Post-op Assessment: Report given to RN and Post -op Vital signs reviewed and stable  Post vital signs: Reviewed and stable  Last Vitals:  Vitals Value Taken Time  BP 143/67   Temp    Pulse 92 02/20/21 1147  Resp 14 02/20/21 1147  SpO2 96 % 02/20/21 1147  Vitals shown include unvalidated device data.  Last Pain:  Vitals:   02/20/21 0904  TempSrc:   PainSc: 0-No pain         Complications: No notable events documented.

## 2021-02-20 NOTE — Discharge Instructions (Addendum)
[facility] Office Phone Number [phone]  BREAST BIOPSY/ PARTIAL MASTECTOMY: POST OP INSTRUCTIONS  Always review your discharge instruction sheet given to you by the facility where your surgery was performed.  IF YOU HAVE DISABILITY OR FAMILY LEAVE FORMS, YOU MUST BRING THEM TO THE OFFICE FOR PROCESSING.  DO NOT GIVE THEM TO YOUR DOCTOR.  A prescription for pain medication may be given to you upon discharge.  Take your pain medication as prescribed, if needed.  If narcotic pain medicine is not needed, then you may take acetaminophen (Tylenol) or ibuprofen (Advil) as needed. Take your usually prescribed medications unless otherwise directed If you need a refill on your pain medication, please contact your pharmacy.  They will contact our office to request authorization.  Prescriptions will not be filled after 5pm or on week-ends. You should eat very light the first 24 hours after surgery, such as soup, crackers, pudding, etc.  Resume your normal diet the day after surgery. Most patients will experience some swelling and bruising in the breast.  Ice packs and a good support bra will help.  Swelling and bruising can take several days to resolve.  It is common to experience some constipation if taking pain medication after surgery.  Increasing fluid intake and taking a stool softener will usually help or prevent this problem from occurring.  A mild laxative (Milk of Magnesia or Miralax) should be taken according to package directions if there are no bowel movements after 48 hours. Unless discharge instructions indicate otherwise, you may remove your bandages 48 hours after surgery, and you may shower at that time.  You may have steri-strips (small skin tapes) in place directly over the incision.  These strips should be left on the skin for 7-10 days.   Any sutures or staples will be removed at the office during your follow-up visit. ACTIVITIES:  You may resume regular daily activities  (gradually increasing) beginning the next day.  Wearing a good support bra or sports bra (or the breast binder) minimizes pain and swelling.  You may have sexual intercourse when it is comfortable. You may drive when you no longer are taking prescription pain medication, you can comfortably wear a seatbelt, and you can safely maneuver your car and apply brakes. RETURN TO WORK:  __________1 week_______________ You should see your doctor in the office for a follow-up appointment approximately two weeks after your surgery.  Your doctor's nurse will typically make your follow-up appointment when she calls you with your pathology report.  Expect your pathology report 2-3 business days after your surgery.  You may call to check if you do not hear from us after three days.   WHEN TO CALL YOUR DOCTOR: Fever over 101.0 Nausea and/or vomiting. Extreme swelling or bruising. Continued bleeding from incision. Increased pain, redness, or drainage from the incision.  The clinic staff is available to answer your questions during regular business hours.  Please don't hesitate to call and ask to speak to one of the nurses for clinical concerns.  If you have a medical emergency, go to the nearest emergency room or call 911.  A surgeon from [facility] is always on call at the hospital.  For further questions, please visit [email or web address]   

## 2021-02-20 NOTE — Interval H&P Note (Signed)
History and Physical Interval Note:  02/20/2021 9:39 AM  Kristi Hunter  has presented today for surgery, with the diagnosis of RIGHT BREAST CANCER.  The various methods of treatment have been discussed with the patient and family. After consideration of risks, benefits and other options for treatment, the patient has consented to  Procedure(s): RIGHT BREAST LUMPECTOMY WITH RADIOACTIVE SEED LOCALIZATION (Right) as a surgical intervention.  The patient's history has been reviewed, patient examined, no change in status, stable for surgery.  I have reviewed the patient's chart and labs.  Questions were answered to the patient's satisfaction.     Stark Klein

## 2021-02-20 NOTE — Anesthesia Postprocedure Evaluation (Signed)
Anesthesia Post Note  Patient: Kristi Hunter  Procedure(s) Performed: RIGHT BREAST LUMPECTOMY WITH RADIOACTIVE SEED LOCALIZATION (Right: Breast)     Patient location during evaluation: PACU Anesthesia Type: General Level of consciousness: awake and alert Pain management: pain level controlled Vital Signs Assessment: post-procedure vital signs reviewed and stable Respiratory status: spontaneous breathing, nonlabored ventilation and respiratory function stable Cardiovascular status: blood pressure returned to baseline and stable Postop Assessment: no apparent nausea or vomiting Anesthetic complications: no   No notable events documented.  Last Vitals:  Vitals:   02/20/21 1202 02/20/21 1215  BP: 139/64 135/67  Pulse: 78 77  Resp: 17 18  Temp:  36.4 C  SpO2: 92% 94%    Last Pain:  Vitals:   02/20/21 1215  TempSrc:   PainSc: 0-No pain                 Lidia Collum

## 2021-02-20 NOTE — Op Note (Signed)
Right Breast Radioactive seed localized lumpectomy  Indications: This patient presents with history of right breast cancer, upper outer quadrant, cT1cN0 grade 2 invasive ductal carcinoma, receptors+/+/-  Pre-operative Diagnosis: right breast cancer  Post-operative Diagnosis: Same  Surgeon: Stark Klein   Anesthesia: General endotracheal anesthesia  ASA Class: 3  Procedure Details  The patient was seen in the Holding Room. The risks, benefits, complications, treatment options, and expected outcomes were discussed with the patient. The possibilities of bleeding, infection, the need for additional procedures, failure to diagnose a condition, and creating a complication requiring other procedures or operations were discussed with the patient. The patient concurred with the proposed plan, giving informed consent.  The site of surgery properly noted/marked. The patient was taken to Operating Room # 2, identified, and the procedure verified as right breast seed localized lumpectomy.  The right breast and chest were prepped and draped in standard fashion. A lateral circumareolar incision was made near the previously placed radioactive seed.  Dissection was carried down around the point of maximum signal intensity. The cautery was used to perform the dissection.   The specimen was inked with the margin marker paint kit.    Specimen radiography confirmed inclusion of the mammographic lesion, the clip, and the seed.  The background signal in the breast was zero.  Hemostasis was achieved with cautery.  The cavity was marked with clips on each border other than the anterior border.  The wound was irrigated and closed with 3-0 vicryl interrupted deep dermal sutures and 4-0 monocryl running subcuticular suture.      Sterile dressings were applied. At the end of the operation, all sponge, instrument, and needle counts were correct.   Findings: Seed, clip in specimen.  Anterior margin is areola (skin)    Estimated Blood Loss:  min         Specimens: left breast tissue with seed         Complications:  None; patient tolerated the procedure well.         Disposition: PACU - hemodynamically stable.         Condition: stable

## 2021-02-21 ENCOUNTER — Encounter (HOSPITAL_COMMUNITY): Payer: Self-pay | Admitting: General Surgery

## 2021-02-22 ENCOUNTER — Telehealth: Payer: Self-pay | Admitting: *Deleted

## 2021-02-22 ENCOUNTER — Ambulatory Visit (INDEPENDENT_AMBULATORY_CARE_PROVIDER_SITE_OTHER): Payer: Medicare HMO | Admitting: Family Medicine

## 2021-02-22 ENCOUNTER — Encounter: Payer: Self-pay | Admitting: *Deleted

## 2021-02-22 LAB — SURGICAL PATHOLOGY

## 2021-02-22 NOTE — Telephone Encounter (Signed)
Received order for oncotype testing. Requisition faxed to pathology and [place] [facility]  Confirmed post op appt with pt to see Dr. Barry Dienes. Informed Cari will call with genetics results once they are received. Received verbal understanding. Relate doing well after sx.

## 2021-02-23 DIAGNOSIS — G4733 Obstructive sleep apnea (adult) (pediatric): Secondary | ICD-10-CM | POA: Diagnosis not present

## 2021-02-26 ENCOUNTER — Telehealth: Payer: Self-pay | Admitting: Genetic Counselor

## 2021-02-26 DIAGNOSIS — G4733 Obstructive sleep apnea (adult) (pediatric): Secondary | ICD-10-CM | POA: Diagnosis not present

## 2021-02-26 NOTE — Telephone Encounter (Signed)
Contacted patient in attempt to disclose results of genetic testing.  Patient unable to talk and requested call back later today.

## 2021-02-27 ENCOUNTER — Ambulatory Visit: Payer: Self-pay | Admitting: Genetic Counselor

## 2021-02-27 ENCOUNTER — Encounter: Payer: Self-pay | Admitting: Genetic Counselor

## 2021-02-27 ENCOUNTER — Telehealth: Payer: Self-pay | Admitting: Genetic Counselor

## 2021-02-27 DIAGNOSIS — Z800 Family history of malignant neoplasm of digestive organs: Secondary | ICD-10-CM

## 2021-02-27 DIAGNOSIS — Z8542 Personal history of malignant neoplasm of other parts of uterus: Secondary | ICD-10-CM

## 2021-02-27 DIAGNOSIS — Z803 Family history of malignant neoplasm of breast: Secondary | ICD-10-CM

## 2021-02-27 DIAGNOSIS — Z170 Estrogen receptor positive status [ER+]: Secondary | ICD-10-CM

## 2021-02-27 DIAGNOSIS — Z1379 Encounter for other screening for genetic and chromosomal anomalies: Secondary | ICD-10-CM

## 2021-02-27 DIAGNOSIS — C50411 Malignant neoplasm of upper-outer quadrant of right female breast: Secondary | ICD-10-CM

## 2021-02-27 NOTE — Telephone Encounter (Signed)
Revealed negative genetic testing.  Discussed that we do not know why she has cancer or why there is cancer in the family. It could be sporadic/familial, due to a different gene that we are not testing, or maybe our current technology may not be able to pick something up.  It will be important for her to keep in contact with genetics to keep up with whether additional testing may be needed.

## 2021-02-27 NOTE — Progress Notes (Addendum)
HPI:  Ms. Heatherly was previously seen in the [place] clinic due to a personal and family history of cancer and concerns regarding a hereditary predisposition to cancer. Please refer to our prior cancer genetics clinic note for more information regarding our discussion, assessment and recommendations, at the time. Ms. Escutia recent genetic test results were disclosed to her, as were recommendations warranted by these results. These results and recommendations are discussed in more detail below.  CANCER HISTORY:  In 2022, at the age of 56, Ms. Axtell was diagnosed with invasive ductal carcinoma of the right breast (ER+/PR+/HER2-).  She also has a history of uterine cancer in 2011.  Oncology History  Malignant neoplasm of upper-outer quadrant of right breast in female, estrogen receptor positive ([place])  02/08/2021 Initial Diagnosis   Malignant neoplasm of upper-outer quadrant of right breast in female, estrogen receptor positive ([place])    02/14/2021 Cancer Staging   Staging form: Breast, AJCC 8th Edition - Clinical stage from 02/14/2021: Stage IA (cT1b, cN0, cM0, G2, ER+, PR+, HER2-) - Signed by Chauncey Cruel, MD on 02/14/2021  Stage prefix: Initial diagnosis  Histologic grading system: 3 grade system    02/23/2021 Genetic Testing   Negative hereditary cancer genetic testing: no pathogenic variants detected in [facility] CustomNext-Cancer +RNAinsight Panel.  The report date is February 23, 2021.    The CustomNext-Cancer +RNAinsight Panel offered by [facility] and includes sequencing and rearrangement analysis for the following 91 genes: AIP, ALK, APC, ATM, AXIN2, BAP1, BARD1, BLM, BMPR1A, BRCA1, BRCA2, BRIP1, CDC73, CDH1, CDK4, CDKN1B, CDKN2A, CHEK2, CTNNA1, DICER1, FANCC, FH, FLCN, GALNT12, KIF1B, LZTR1, MAX, MEN1, MET, MLH1, MRE11A, MSH2, MSH3, MSH6, MUTYH, NBN, NF1, NF2, NTHL1, PALB2, PHOX2B, PMS2, POT1, PRKAR1A, PTCH1, PTEN, RAD50, RAD51C, RAD51D, RB1, RECQL, RET, SDHA, SDHAF2,  SDHB, SDHC, SDHD, SMAD4, SMARCA4, SMARCB1, SMARCE1, STK11, SUFU, TMEM127, TP53, TSC1, TSC2, VHL and XRCC2 (sequencing and deletion/duplication); CASR, CFTR, CPA1, CTRC, EGFR, EGLN1, FAM175A, HOXB13, KIT, MITF, MLH3, PALLD, PDGFRA, POLD1, POLE, PRSS1, RINT1, RPS20, SPINK1 and TERT (sequencing only); EPCAM and GREM1 (deletion/duplication only). RNA data is routinely analyzed for use in variant interpretation for all genes.      FAMILY HISTORY:  We obtained a detailed, 4-generation family history.  Significant diagnoses are listed below:      Family History  Problem Relation Age of Onset   Melanoma Mother          dx after 80, sun-exposed areas   Melanoma Father          dx after 61, back   Cancer Brother          ? esophageal, d. 39   Breast cancer Paternal Aunt 24   Colon cancer Maternal Grandfather          dx 1, dx 70       Ms. Dina is unaware of previous family history of genetic testing for hereditary cancer risks. There is no reported Ashkenazi Jewish ancestry. There is no known consanguinity between her parents.   GENETIC TEST RESULTS: Genetic testing reported out on February 23, 2021.  The [facility] CustomNext-Cancer +RNAinsight Panel found no pathogenic mutations. The CustomNext-Cancer +RNAinsight Panel offered by [facility] and includes sequencing and rearrangement analysis for the following 91 genes: AIP, ALK, APC, ATM, AXIN2, BAP1, BARD1, BLM, BMPR1A, BRCA1, BRCA2, BRIP1, CDC73, CDH1, CDK4, CDKN1B, CDKN2A, CHEK2, CTNNA1, DICER1, FANCC, FH, FLCN, GALNT12, KIF1B, LZTR1, MAX, MEN1, MET, MLH1, MRE11A, MSH2, MSH3, MSH6, MUTYH, NBN, NF1, NF2, NTHL1, PALB2, PHOX2B, PMS2,  POT1, PRKAR1A, PTCH1, PTEN, RAD50, RAD51C, RAD51D, RB1, RECQL, RET, SDHA, SDHAF2, SDHB, SDHC, SDHD, SMAD4, SMARCA4, SMARCB1, SMARCE1, STK11, SUFU, TMEM127, TP53, TSC1, TSC2, VHL and XRCC2 (sequencing and deletion/duplication); CASR, CFTR, CPA1, CTRC, EGFR, EGLN1, FAM175A, HOXB13, KIT, MITF, MLH3, PALLD, PDGFRA, POLD1, POLE,  PRSS1, RINT1, RPS20, SPINK1 and TERT (sequencing only); EPCAM and GREM1 (deletion/duplication only). RNA data is routinely analyzed for use in variant interpretation for all genes.   The test report has been scanned into EPIC and is located under the Molecular Pathology section of the Results Review tab.  A portion of the result report is included below for reference.     We discussed with Ms. Bernardi that because current genetic testing is not perfect, it is possible there may be a gene mutation in one of these genes that current testing cannot detect, but that chance is small.  We also discussed, that there could be another gene that has not yet been discovered, or that we have not yet tested, that is responsible for the cancer diagnoses in the family. It is also possible there is a hereditary cause for the cancer in the family that Ms. Imai did not inherit and therefore was not identified in her testing.  Therefore, it is important to remain in touch with cancer genetics in the future so that we can continue to offer Ms. Hollars the most up to date genetic testing.   Of note, Ms. Gagner requested testing for the CFTR gene given that her daughter and granddaughter are reported carriers for cystic fibrosis.  We discussed that we are able to include CFTR on a hereditary cancer panel given its association with hereditary pancreatitis. Ms. Neace was not found to be a carrier for CFTR.  We discussed Ms. Dust that if Ms. Previti was a carrier for cystic fibrosis, it likely would have been detected by this test; however, we cannot guarantee that the family CFTR variant was included in the testing without confirmation of the family variant with a test report.  We also discussed that, given approximately 1/25 individuals are carrier for cystic fibrosis in the general population, other members of her family (nieces, nephews, etc.) could be still carriers of cystic fibrosis despite her negative result.  Carrier  screening is available for those of reproductive age.   ADDITIONAL GENETIC TESTING: We discussed with Ms. Tipping that her genetic testing was fairly extensive.  If there are genes identified to increase cancer risk that can be analyzed in the future, we would be happy to discuss and coordinate this testing at that time.    CANCER SCREENING RECOMMENDATIONS: Ms. Blanchard test result is considered negative (normal).  This means that we have not identified a hereditary cause for her personal history of cancer at this time. Most cancers happen by chance and this negative test suggests that her cancer may fall into this category.    While reassuring, this does not definitively rule out a hereditary predisposition to cancer. It is still possible that there could be genetic mutations that are undetectable by current technology. There could be genetic mutations in genes that have not been tested or identified to increase cancer risk.  Therefore, it is recommended she continue to follow the cancer management and screening guidelines provided by her oncology and primary healthcare provider.   An individual's cancer risk and medical management are not determined by genetic test results alone. Overall cancer risk assessment incorporates additional factors, including personal medical history, family history, and any available genetic  information that may result in a personalized plan for cancer prevention and surveillance  RECOMMENDATIONS FOR FAMILY MEMBERS:  Individuals in this family might be at some increased risk of developing cancer, over the general population risk, simply due to the family history of cancer.  We recommended women in this family have a yearly mammogram beginning at age 47, or 16 years younger than the earliest onset of cancer, an annual clinical breast exam, and perform monthly breast self-exams. Women in this family should also have a gynecological exam as recommended by their primary provider.  Family members should be referred for colonoscopy starting at age 14, or earlier, as recommended by their providers.   FOLLOW-UP: Lastly, we discussed with Ms. Cadenhead that cancer genetics is a rapidly advancing field and it is possible that new genetic tests will be appropriate for her and/or her family members in the future. We encouraged her to remain in contact with cancer genetics on an annual basis so we can update her personal and family histories and let her know of advances in cancer genetics that may benefit this family.   Our contact number was provided. Ms. Milstein questions were answered to her satisfaction, and she knows she is welcome to call us at anytime with additional questions or concerns.     Quetzal Meany M. Joette Catching, [place], [facility] Genetic Counselor [email or web address] ([phone]

## 2021-03-02 DIAGNOSIS — C50411 Malignant neoplasm of upper-outer quadrant of right female breast: Secondary | ICD-10-CM | POA: Diagnosis not present

## 2021-03-02 DIAGNOSIS — Z170 Estrogen receptor positive status [ER+]: Secondary | ICD-10-CM | POA: Diagnosis not present

## 2021-03-02 DIAGNOSIS — R3915 Urgency of urination: Secondary | ICD-10-CM | POA: Diagnosis not present

## 2021-03-05 ENCOUNTER — Encounter: Payer: Self-pay | Admitting: *Deleted

## 2021-03-05 ENCOUNTER — Telehealth: Payer: Self-pay | Admitting: *Deleted

## 2021-03-05 DIAGNOSIS — Z170 Estrogen receptor positive status [ER+]: Secondary | ICD-10-CM

## 2021-03-05 DIAGNOSIS — C50411 Malignant neoplasm of upper-outer quadrant of right female breast: Secondary | ICD-10-CM

## 2021-03-05 NOTE — Telephone Encounter (Signed)
Received oncotype results of 8/3%. Patient is aware. Referral placed for Dr. Lisbeth Renshaw appt.

## 2021-03-06 ENCOUNTER — Encounter: Payer: Self-pay | Admitting: Oncology

## 2021-03-12 ENCOUNTER — Encounter: Payer: Self-pay | Admitting: *Deleted

## 2021-03-13 ENCOUNTER — Ambulatory Visit (INDEPENDENT_AMBULATORY_CARE_PROVIDER_SITE_OTHER): Payer: Medicare HMO | Admitting: Family Medicine

## 2021-03-13 ENCOUNTER — Other Ambulatory Visit: Payer: Self-pay

## 2021-03-13 ENCOUNTER — Encounter (INDEPENDENT_AMBULATORY_CARE_PROVIDER_SITE_OTHER): Payer: Self-pay | Admitting: Family Medicine

## 2021-03-13 VITALS — BP 121/81 | HR 82 | Temp 97.9°F | Ht 63.0 in | Wt 254.0 lb

## 2021-03-13 DIAGNOSIS — R7303 Prediabetes: Secondary | ICD-10-CM

## 2021-03-13 DIAGNOSIS — R69 Illness, unspecified: Secondary | ICD-10-CM | POA: Diagnosis not present

## 2021-03-13 DIAGNOSIS — F3289 Other specified depressive episodes: Secondary | ICD-10-CM

## 2021-03-13 DIAGNOSIS — Z6841 Body Mass Index (BMI) 40.0 and over, adult: Secondary | ICD-10-CM

## 2021-03-13 DIAGNOSIS — K5900 Constipation, unspecified: Secondary | ICD-10-CM | POA: Diagnosis not present

## 2021-03-13 MED ORDER — METFORMIN HCL 500 MG PO TABS: 500.0000 mg | ORAL_TABLET | Freq: Two times a day (BID) | ORAL | 0 refills | Status: DC

## 2021-03-13 MED ORDER — BUPROPION HCL ER (SR) 150 MG PO TB12: 150.0000 mg | ORAL_TABLET | Freq: Every morning | ORAL | 0 refills | Status: DC

## 2021-03-14 ENCOUNTER — Other Ambulatory Visit (INDEPENDENT_AMBULATORY_CARE_PROVIDER_SITE_OTHER): Payer: Self-pay | Admitting: Family Medicine

## 2021-03-14 DIAGNOSIS — R7303 Prediabetes: Secondary | ICD-10-CM

## 2021-03-14 NOTE — Telephone Encounter (Signed)
Dr.Beasley 

## 2021-03-19 NOTE — Progress Notes (Signed)
Chief Complaint:   OBESITY Kristi Hunter is here to discuss her progress with her obesity treatment plan along with follow-up of her obesity related diagnoses. Kristi Hunter is on the Category 2 Plan and states she is following her eating plan approximately 60% of the time. Kristi Hunter states she is doing Chief of Staff for 45 minutes 2 times per week.  Today's visit was #: 40 Starting weight: 267 lbs Starting date: 02/22/2020 Today's weight: 254 lbs Today's date: 03/13/2021 Total lbs lost to date: 13 Total lbs lost since last in-office visit: 0  Interim History: Kristi Hunter has done well maintaining her weight. She is recovering from breast cancer surgery and she is uncertain what her next steps are. She is trying to eat healthy, but her protein level is falling and likely affecting her RMR.  Subjective:   1. Pre-diabetes Kristi Hunter is stable on metformin, and she is working on decreasing simple carbohydrates.  2. Constipation, unspecified constipation type Kristi Hunter notes increased constipations, possibly  related to Wellbutrin. She started benefiber and she wants to know if that is on to take.  3. Other depression with emotional eating Kristi Hunter notes some increased constipation on Wellbutrin, but she feels it has helped her get through her cancer diagnosis.  Assessment/Plan:   1. Pre-diabetes Kristi Hunter will continue to work on weight loss, exercise, and decreasing simple carbohydrates to help decrease the risk of diabetes. We will refill metformin for 1 month.  - metFORMIN (GLUCOPHAGE) 500 MG tablet; Take 1 tablet (500 mg total) by mouth 2 (two) times daily with a meal.  Dispense: 60 tablet; Refill: 0  2. Constipation, unspecified constipation type Kristi Hunter is on to continue benefiber and increasing her water intake. She was informed that a decrease in bowel movement frequency is normal while losing weight, but stools should not be hard or painful. Orders and follow up as documented  in patient record.   3. Other depression with emotional eating Behavior modification techniques were discussed today to help Kristi Hunter deal with her emotional/non-hunger eating behaviors. We will refill Wellbutrin SR for 1 month. Orders and follow up as documented in patient record.   - buPROPion (WELLBUTRIN SR) 150 MG 12 hr tablet; Take 1 tablet (150 mg total) by mouth in the morning.  Dispense: 30 tablet; Refill: 0  4. Obesity with current BMI 45.1 Kristi Hunter is currently in the action stage of change. As such, her goal is to continue with weight loss efforts. She has agreed to the Category 2 Plan.   We will recheck fasting labs at her next visit.  Exercise goals: As is.  Behavioral modification strategies: increasing lean protein intake and increasing water intake.  Kristi Hunter has agreed to follow-up with our clinic in 4 weeks. She was informed of the importance of frequent follow-up visits to maximize her success with intensive lifestyle modifications for her multiple health conditions.   Objective:   Blood pressure 121/81, pulse 82, temperature 97.9 F (36.6 C), height '5\' 3"'$  (1.6 m), weight 254 lb (115.2 kg), SpO2 94 %. Body mass index is 44.99 kg/m.  General: Cooperative, alert, well developed, in no acute distress. HEENT: Conjunctivae and lids unremarkable. Cardiovascular: Regular rhythm.  Lungs: Normal work of breathing. Neurologic: No focal deficits.   Lab Results  Component Value Date   CREATININE 0.71 02/14/2021   BUN 18 02/14/2021   NA 141 02/14/2021   K 3.9 02/14/2021   CL 104 02/14/2021   CO2 31 02/14/2021   Lab Results  Component Value Date  ALT 21 02/14/2021   AST 17 02/14/2021   ALKPHOS 62 02/14/2021   BILITOT 0.3 02/14/2021   Lab Results  Component Value Date   HGBA1C 5.8 (H) 02/19/2021   HGBA1C 5.9 (H) 02/22/2020   HGBA1C 5.9 05/10/2019   HGBA1C 6.0 01/13/2019   Lab Results  Component Value Date   INSULIN 21.5 02/22/2020   Lab Results   Component Value Date   TSH 1.470 02/22/2020   Lab Results  Component Value Date   CHOL 151 01/13/2019   HDL 44.90 01/13/2019   LDLCALC 92 01/13/2019   TRIG 72.0 01/13/2019   CHOLHDL 3 01/13/2019   Lab Results  Component Value Date   VD25OH 42.1 02/22/2020   Lab Results  Component Value Date   WBC 8.6 02/14/2021   HGB 12.8 02/14/2021   HCT 39.3 02/14/2021   MCV 85.6 02/14/2021   PLT 291 02/14/2021   No results found for: IRON, TIBC, FERRITIN  Obesity Behavioral Intervention:   Approximately 15 minutes were spent on the discussion below.  ASK: We discussed the diagnosis of obesity with Kristi Hunter today and Kristi Hunter agreed to give [name] permission to discuss obesity behavioral modification therapy today.  ASSESS: Kristi Hunter has the diagnosis of obesity and her BMI today is 45.01. Kristi Hunter is in the action stage of change.   ADVISE: Kristi Hunter was educated on the multiple health risks of obesity as well as the benefit of weight loss to improve her health. She was advised of the need for long term treatment and the importance of lifestyle modifications to improve her current health and to decrease her risk of future health problems.  AGREE: Multiple dietary modification options and treatment options were discussed and Kristi Hunter agreed to follow the recommendations documented in the above note.  ARRANGE: Kristi Hunter was educated on the importance of frequent visits to treat obesity as outlined per CMS and USPSTF guidelines and agreed to schedule her next follow up appointment today.  Attestation Statements:   Reviewed by clinician on day of visit: allergies, medications, problem list, medical history, surgical history, family history, social history, and previous encounter notes.   I, Trixie Dredge, am acting as transcriptionist for [name], MD.  I have reviewed the above documentation for accuracy and completeness, and I agree with the above. -  [name], MD

## 2021-03-20 ENCOUNTER — Ambulatory Visit
Admission: RE | Admit: 2021-03-20 | Discharge: 2021-03-20 | Disposition: A | Discharge status: Home / Self Care | Payer: Medicare HMO | Source: Ambulatory Visit | Attending: Radiation Oncology | Admitting: Radiation Oncology

## 2021-03-20 ENCOUNTER — Other Ambulatory Visit: Payer: Self-pay

## 2021-03-20 ENCOUNTER — Encounter: Payer: Self-pay | Admitting: Radiation Oncology

## 2021-03-20 DIAGNOSIS — Z170 Estrogen receptor positive status [ER+]: Secondary | ICD-10-CM

## 2021-03-20 DIAGNOSIS — C50411 Malignant neoplasm of upper-outer quadrant of right female breast: Secondary | ICD-10-CM

## 2021-03-20 NOTE — Progress Notes (Signed)
Radiation Oncology         [phone] ________________________________  Outpatient Follow Up - Conducted via telephone due to current COVID-19 concerns for limiting patient exposure  I spoke with the patient to conduct this consult visit via telephone to spare the patient unnecessary potential exposure in the healthcare setting during the current COVID-19 pandemic. The patient was notified in advance and was offered a [place] meeting to allow for face to face communication but unfortunately reported that they did not have the appropriate resources/technology to support such a visit and instead preferred to proceed with a telephone visit.  Name: Kristi Hunter        MRN: [id number]  Date of Service: 03/20/2021 DOB: [birth year]  DQ:[id number], Mortimer Fries, MD  Magrinat, Virgie Dad, MD     REFERRING PHYSICIAN: Magrinat, Virgie Dad, MD   DIAGNOSIS: The encounter diagnosis was Malignant neoplasm of upper-outer quadrant of right breast in female, estrogen receptor positive ([place]).   HISTORY OF PRESENT ILLNESS: Kristi Hunter is a 80 y.o. female with a diagnosis of right breast cancer. The patient was found to have an areolar abnormality within the upper outer quadrant on mammogram.  Ultrasound revealed a 1.0 cm tumor.  No axillary adenopathy was seen on the right.  Biopsy was performed and this revealed a grade 2 invasive ductal carcinoma.  Receptor studies indicated that the tumor was estrogen receptor positive, progesterone receptor positive, and HER2/neu negative.  The Ki-67 was 20%.  The patient is felt to be a good candidate for breast conservation treatment and also possible Oncotype testing  Since her last visit, she has undergone right lumpectomy on 02/20/2021, final pathology revealed an invasive and in situ ductal carcinoma measuring 1.2 cm, invasive disease was 1 mm from the medial margin and 2 mm from the anterior margin, her in in situ margins were clear.  Her Oncotype score was 8 and no  systemic chemotherapy is planned.  She is contacted today to discuss adjuvant radiotherapy.     PREVIOUS RADIATION THERAPY: No   PAST MEDICAL HISTORY:  Past Medical History:  Diagnosis Date   Anemia    Anxiety    Arthritis    Back pain    Breast cancer (HCC)    Cancer (HCC)    uterine   Chest pain    Chicken pox    Chronic kidney disease    Constipation    Emphysema of lung ([place])    Family history of breast cancer 02/14/2021   Glaucoma    Hypertension    Joint pain    Lower extremity edema    Lymphedema    Osteoarthritis    Personal history of malignant neoplasm of uterus 02/14/2021   Pre-diabetes    Prediabetes    Shortness of breath    Sleep apnea    Urine incontinence    Uterine cancer ([place])    UTI (urinary tract infection)    Vertigo    Vitamin B 12 deficiency    during pregnancy       PAST SURGICAL HISTORY: Past Surgical History:  Procedure Laterality Date   ABDOMINAL HYSTERECTOMY  2011   BREAST LUMPECTOMY WITH RADIOACTIVE SEED LOCALIZATION Right 02/20/2021   Procedure: RIGHT BREAST LUMPECTOMY WITH RADIOACTIVE SEED LOCALIZATION;  Surgeon: Stark Klein, MD;  Location: [facility] OR;  Service: General;  Laterality: Right;   REPLACEMENT TOTAL KNEE Right 2012     FAMILY HISTORY:  Family History  Problem Relation Age of Onset   Heart attack Mother  Heart disease Mother    Hyperlipidemia Mother    Hypertension Mother    Stroke Mother    Miscarriages / [name] Mother    Diabetes Mother    Thyroid disease Mother    Cancer Mother    Obesity Mother    Melanoma Mother        dx after 59, sun-exposed areas   Cancer Father        skin   Alcohol abuse Father    Asthma Father    COPD Father    Heart attack Father    Heart disease Father    Obesity Father    Melanoma Father        dx after 87, back   Alcohol abuse Brother    Arthritis Brother    Drug abuse Brother    Alcohol abuse Brother    Cancer Brother        ? esophageal, d. 76   Breast  cancer Paternal Aunt 45   Arthritis Maternal Grandmother    Diabetes Maternal Grandmother    Hyperlipidemia Maternal Grandmother    Colon cancer Maternal Grandfather        dx 60, dx 80   Alcohol abuse Paternal Grandfather    Arthritis Paternal Grandfather    Cirrhosis Paternal Grandfather    Depression Daughter    Hypertension Daughter    Mental illness Daughter    Colon polyps Daughter    Depression Son      SOCIAL HISTORY:  reports that she quit smoking about 38 years ago. Her smoking use included cigarettes. She has never used smokeless tobacco. She reports current alcohol use of about 1.0 standard drink of alcohol per week. She reports that she does not use drugs. The patient is widowed and lives in [place]. She is a retired Engineer, maintenance (IT).    ALLERGIES: Percocet [oxycodone-acetaminophen]   MEDICATIONS:  Current Outpatient Medications  Medication Sig Dispense Refill   alendronate (FOSAMAX) 70 MG tablet Take 1 tablet (70 mg total) by mouth every 7 (seven) days. Take with a full glass of water on an empty stomach. (Patient taking differently: Take 70 mg by mouth every Friday. Take with a full glass of water on an empty stomach.) 4 tablet 11   aspirin 81 MG EC tablet Take 81 mg by mouth daily. Swallow whole.     buPROPion (WELLBUTRIN SR) 150 MG 12 hr tablet Take 1 tablet (150 mg total) by mouth in the morning. 30 tablet 0   chlorthalidone (HYGROTON) 25 MG tablet TAKE 1 TABLET BY MOUTH EVERY DAY (Patient taking differently: Take 25 mg by mouth daily.) 90 tablet 1   Lactobacillus (PROBIOTIC ACIDOPHILUS PO) Take 1 capsule by mouth daily.     Melatonin 10 MG TABS Take 10 mg by mouth at bedtime as needed (sleep).     metFORMIN (GLUCOPHAGE) 500 MG tablet Take 1 tablet (500 mg total) by mouth 2 (two) times daily with a meal. 60 tablet 0   metroNIDAZOLE (METROGEL) 1 % gel Apply topically daily. (Patient taking differently: Apply 1 application topically daily as needed (rosacea).) 60 g 1    Multiple Vitamin (MULTIVITAMIN) tablet Take 1 tablet by mouth daily.     naproxen (NAPROSYN) 500 MG tablet Take one twice daily as needed for pain with food. (Patient taking differently: Take 500 mg by mouth 2 (two) times daily as needed for moderate pain. Take one twice daily as needed for pain with food.) 60 tablet 0   omega-3 fish oil (MAXEPA)  1000 MG CAPS capsule Take 1 capsule by mouth daily.      traMADol (ULTRAM) 50 MG tablet Take 1-2 tablets (50-100 mg total) by mouth every 6 (six) hours as needed for moderate pain or severe pain. 10 tablet 0   Turmeric 500 MG CAPS Take 500 mg by mouth daily as needed (pain).     Wheat Dextrin (BENEFIBER PO) Take by mouth.     traZODone (DESYREL) 50 MG tablet Take 50 mg by mouth at bedtime as needed for sleep. (Patient not taking: Reported on 03/20/2021)     No current facility-administered medications for this encounter.     REVIEW OF SYSTEMS: On review of systems, the patient reports that she's doing well since surgery. She's interested in finishing treatment before her trip on 04/19/21. No other complaints are noted.     PHYSICAL EXAM:  Unable to assess due to encounter type.    ECOG = 0  0 - Asymptomatic (Fully active, able to carry on all predisease activities without restriction)  1 - Symptomatic but completely ambulatory (Restricted in physically strenuous activity but ambulatory and able to carry out work of a light or sedentary nature. For example, light housework, office work)  2 - Symptomatic, <50% in bed during the day (Ambulatory and capable of all self care but unable to carry out any work activities. Up and about more than 50% of waking hours)  3 - Symptomatic, >50% in bed, but not bedbound (Capable of only limited self-care, confined to bed or chair 50% or more of waking hours)  4 - Bedbound (Completely disabled. Cannot carry on any self-care. Totally confined to bed or chair)  5 - Death   Eustace Pen MM, Creech RH, Tormey DC, et al.  [phone]). "Toxicity and response criteria of the [facility]". [place] Oncol. 5 (6): 649-55    LABORATORY DATA:  Lab Results  Component Value Date   WBC 8.6 02/14/2021   HGB 12.8 02/14/2021   HCT 39.3 02/14/2021   MCV 85.6 02/14/2021   PLT 291 02/14/2021   Lab Results  Component Value Date   NA 141 02/14/2021   K 3.9 02/14/2021   CL 104 02/14/2021   CO2 31 02/14/2021   Lab Results  Component Value Date   ALT 21 02/14/2021   AST 17 02/14/2021   ALKPHOS 62 02/14/2021   BILITOT 0.3 02/14/2021      RADIOGRAPHY: MM Breast Surgical Specimen  Result Date: 02/20/2021 CLINICAL DATA:  Evaluate surgical specimen following lumpectomy for RIGHT breast cancer. EXAM: SPECIMEN RADIOGRAPH OF THE RIGHT BREAST COMPARISON:  Previous exam(s). FINDINGS: Status post excision of the RIGHT breast. The radioactive seed and RIBBON biopsy marker clip are present, completely intact, and were marked for pathology. IMPRESSION: Specimen radiograph of the RIGHT breast. Electronically Signed   By: Margarette Canada M.D.   On: 02/20/2021 11:21  MM RT RADIOACTIVE SEED LOC MAMMO GUIDE  Result Date: 02/19/2021 CLINICAL DATA:  80 year old female with recently diagnosed grade 2 invasive ductal carcinoma of the right breast presents for preoperative radioactive seed localization. EXAM: MAMMOGRAPHIC GUIDED RADIOACTIVE SEED LOCALIZATION OF THE RIGHT BREAST COMPARISON:  Previous exam(s). FINDINGS: Patient presents for radioactive seed localization prior to right breast lumpectomy. I met with the patient and we discussed the procedure of seed localization including benefits and alternatives. We discussed the high likelihood of a successful procedure. We discussed the risks of the procedure including infection, bleeding, tissue injury and further surgery. We discussed the low  dose of radioactivity involved in the procedure. Informed, written consent was given. The usual time-out protocol was performed  immediately prior to the procedure. Using mammographic guidance, sterile technique, 1% lidocaine and an I-125 radioactive seed, the ribbon shaped biopsy marking clip with associated mass was localized using a lateral to medial approach. The follow-up mammogram images confirm the seed in the expected location and were marked for Dr. Barry Dienes. Follow-up survey of the patient confirms presence of the radioactive seed. Order number of I-125 seed:  [id number]. Total activity:  3.546 millicuries reference Date: 01/12/2021 The patient tolerated the procedure well and was released from the [facility]. She was given instructions regarding seed removal. IMPRESSION: Radioactive seed localization right breast. No apparent complications. Electronically Signed   By: Everlean Alstrom M.D.   On: 02/19/2021 15:32      IMPRESSION/PLAN: 1. Stage IA, pT1cNxM0 grade 2 ER/PR positive invasive ductal carcinoma of the right breast. Dr. Lisbeth Renshaw discusses the final pathology findings and reviews the nature of right breast disease. Fortunately the patient is doing well since surgery and based on oncotype scoring, chemotherapy is not indicated. Dr. Lisbeth Renshaw discusses the rationale for external radiotherapy to the breast  to reduce risks of local recurrence followed by antiestrogen therapy. We discussed the risks, benefits, short, and long term effects of radiotherapy, as well as the curative intent, and the patient is interested in proceeding. Dr. Lisbeth Renshaw discusses the delivery and logistics of radiotherapy and recommends a course of 4 weeks of radiotherapy to the right breast. Her upcoming trip however overlaps and after discussing options, Dr. Lisbeth Renshaw feels that an opportunity to complete treatment over 3 1/2 weeks of treatment instead forgoing the boost, so she would receive 16 fxns. We will simulate tomorrow and start treatment 03/26/21.  Given current concerns for patient exposure during the COVID-19 pandemic, this encounter was conducted via  telephone.  The patient has provided two factor identification and has given verbal consent for this type of encounter and has been advised to only accept a meeting of this type in a secure network environment. The time spent during this encounter was 45 minutes including preparation, discussion, and coordination of the patient's care. The attendants for this meeting include Dr. Lisbeth Renshaw, Hayden Pedro  and Kandice Hams.  During the encounter, Dr. Lisbeth Renshaw, and Hayden Pedro were located at [facility] Radiation Oncology Department.  Kristi Hunter was located at home.   The above documentation reflects my direct findings during this shared patient visit. Please see the separate note by Dr. Lisbeth Renshaw on this date for the remainder of the patient's plan of care.    Carola Rhine, [facility]    **Disclaimer: This note was dictated with voice recognition software. Similar sounding words can inadvertently be transcribed and this note may contain transcription errors which may not have been corrected upon publication of note.**

## 2021-03-20 NOTE — Progress Notes (Signed)
Patient is due to consult appointment with PA, Meaningful use questions complete. Made patient aware that appointment will be via telephone. Patient voiced understanding.

## 2021-03-21 ENCOUNTER — Encounter: Payer: Self-pay | Admitting: *Deleted

## 2021-03-21 ENCOUNTER — Encounter: Payer: Self-pay | Admitting: Radiation Oncology

## 2021-03-21 ENCOUNTER — Other Ambulatory Visit: Payer: Self-pay

## 2021-03-21 ENCOUNTER — Ambulatory Visit
Admission: RE | Admit: 2021-03-21 | Discharge: 2021-03-21 | Disposition: A | Discharge status: Home / Self Care | Payer: Medicare HMO | Source: Ambulatory Visit | Attending: Radiation Oncology | Admitting: Radiation Oncology

## 2021-03-21 NOTE — Progress Notes (Signed)
Patient had a follow-up appointment yesterday by telephone and was in agreement to proceed with radiotherapy.  She does have a favorable care cancer though she had close margins.  She was in agreement to come in today to do simulation and signed written consent to proceed.  Her breast was well-healed and when she went to the department to do the simulation was unable to tolerate positioning she discussed this with Dr. Lisbeth Renshaw.  At the end of the discussion she has decided to forego radiotherapy.  She will follow-up with medical oncology to discuss antiestrogen therapy.

## 2021-03-23 DIAGNOSIS — N3946 Mixed incontinence: Secondary | ICD-10-CM | POA: Diagnosis not present

## 2021-03-23 DIAGNOSIS — R3915 Urgency of urination: Secondary | ICD-10-CM | POA: Diagnosis not present

## 2021-03-23 DIAGNOSIS — R350 Frequency of micturition: Secondary | ICD-10-CM | POA: Diagnosis not present

## 2021-03-26 ENCOUNTER — Ambulatory Visit: Payer: Medicare HMO | Admitting: Radiation Oncology

## 2021-03-27 ENCOUNTER — Ambulatory Visit: Payer: Medicare HMO

## 2021-03-27 DIAGNOSIS — G4733 Obstructive sleep apnea (adult) (pediatric): Secondary | ICD-10-CM | POA: Diagnosis not present

## 2021-03-28 ENCOUNTER — Ambulatory Visit: Payer: Medicare HMO

## 2021-03-29 ENCOUNTER — Ambulatory Visit: Payer: Medicare HMO

## 2021-03-29 ENCOUNTER — Telehealth: Payer: Self-pay | Admitting: Oncology

## 2021-03-29 NOTE — Telephone Encounter (Signed)
Patient called to reschedule her upcoming appointment due to being out of town.

## 2021-03-30 ENCOUNTER — Ambulatory Visit: Payer: Medicare HMO

## 2021-03-30 ENCOUNTER — Telehealth: Payer: Self-pay

## 2021-03-30 NOTE — Progress Notes (Signed)
Left voice message to confirmed patient telephone appointment on 04/02/2021 for CCM at 9:00 am with Junius Argyle the Clinical pharmacist. Left message to have all medications, supplements, blood pressure logs available during appointment and to return call if need to reschedule.  [place] Pharmacist Assistant [phone]

## 2021-04-02 ENCOUNTER — Ambulatory Visit (INDEPENDENT_AMBULATORY_CARE_PROVIDER_SITE_OTHER): Payer: Medicare HMO

## 2021-04-02 ENCOUNTER — Ambulatory Visit: Payer: Medicare HMO

## 2021-04-02 DIAGNOSIS — M810 Age-related osteoporosis without current pathological fracture: Secondary | ICD-10-CM

## 2021-04-02 DIAGNOSIS — R69 Illness, unspecified: Secondary | ICD-10-CM | POA: Diagnosis not present

## 2021-04-02 DIAGNOSIS — F40240 Claustrophobia: Secondary | ICD-10-CM

## 2021-04-02 DIAGNOSIS — I10 Essential (primary) hypertension: Secondary | ICD-10-CM | POA: Diagnosis not present

## 2021-04-02 DIAGNOSIS — F325 Major depressive disorder, single episode, in full remission: Secondary | ICD-10-CM

## 2021-04-02 NOTE — Patient Instructions (Signed)
Visit Information It was great speaking with you today!  Please let me know if you have any questions about our visit.   Goals Addressed             This Visit's Progress    Track and Manage My Blood Pressure-Hypertension       Timeframe:  Long-Range Goal Priority:  High Start Date: 04/02/2021                             Expected End Date: 04/02/2022                      Follow Up Date 06/10/2021    - check blood pressure weekly    Why is this important?   You won't feel high blood pressure, but it can still hurt your blood vessels.  High blood pressure can cause heart or kidney problems. It can also cause a stroke.  Making lifestyle changes like losing a little weight or eating less salt will help.  Checking your blood pressure at home and at different times of the day can help to control blood pressure.  If the doctor prescribes medicine remember to take it the way the doctor ordered.  Call the office if you cannot afford the medicine or if there are questions about it.     Notes:         Patient Care Plan: General Pharmacy (Adult)     Problem Identified: Hypertension, Hyperlipidemia, Depression, Osteoporosis, Osteoarthritis, Allergic Rhinitis, and Prediabetes   Priority: High     Long-Range Goal: Patient-Specific Goal   Start Date: 04/02/2021  Expected End Date: 04/02/2022  This Visit's Progress: On track  Priority: High  Note:   Current Barriers:  No barriers noted  Pharmacist Clinical Goal(s):  Patient will maintain control of blood pressure as evidenced by BP less than 140/90  through collaboration with PharmD and provider.   Interventions: 1:1 collaboration with Libby Maw, MD regarding development and update of comprehensive plan of care as evidenced by provider attestation and co-signature Inter-disciplinary care team collaboration (see longitudinal plan of care) Comprehensive medication review performed; medication list updated in electronic  medical record  Hypertension (BP goal <140/90) -Controlled -Current treatment: Chlorthalidone 25 mg daily  -Medications previously tried: HCTZ  -Current home readings: NA -Current dietary habits: Trying to calorie restrict, watch simple carbohydrates.  -Current exercise habits: Silver sneakers twice weekly. A little bit of walking, but limited due to knee pain.  -Denies hypotensive/hypertensive symptoms -Educated on Daily salt intake goal < 2300 mg; -Counseled to monitor BP at home weekly, document, and provide log at future appointments -Recommended to continue current medication  Hyperlipidemia: (LDL goal < 100) -Controlled -Current treatment: None -Medications previously tried: NA  -Counseled on diet and exercise extensively  Prediabetes (A1c goal <6.5%) -Controlled -Current medications: Metformin 500 mg twice daily  -Medications previously tried: NA  -Recommended to continue current medication  Depression/Anxiety (Goal: Maintain stable mood) -Controlled -Current treatment: Wellbutrin SR 150 mg daily  -Medications previously tried/failed: NA -PHQ9: 0 -GAD7: 0 -Feels mood is well controlled despite recent cancer diagnosis. Anxiety mostly related to claustrophobia. Wellbutrin has been beneficial at helping manage her food cravings.  -Recommended to continue current medication  Osteoporosis (Goal Prevent bone fractures) -Controlled -Patient is a candidate for pharmacologic treatment due to T-Score < -2.5 in femoral neck -Current treatment  Alendronate 70 mg weekly (started Dec 2021)  -Medications  previously tried: NA  -Counseled on oral bisphosphonate administration: take in the morning, 30 minutes prior to food with 6-8 oz of water. Do not lie down for at least 30 minutes after taking. -Recommended to continue current medication -Recheck DEXA   Patient Goals/Self-Care Activities Patient will:  - check blood pressure weekly, document, and provide at future  appointments  Follow Up Plan: Telephone follow up appointment with care management team member scheduled for:  10/04/21 at 8:30 AM      Patient agreed to services and verbal consent obtained.   Patient verbalizes understanding of instructions provided today and agrees to view in [place].   Junius Argyle, PharmD, [place], CPP Clinical Pharmacist [facility]  [phone]

## 2021-04-02 NOTE — Progress Notes (Signed)
Chronic Care Management Pharmacy Note  04/02/2021 Name:  Kristi Hunter MRN:  [id number] DOB:  07-08-1941  Summary: Patient presents for CCM follow-up. She was recently diagnosed with breast cancer but overall is in good spirits today. She continue to work on managing her weight and is interested in getting updated on her vaccines.  Recommendations/Changes made from today's visit: Continue current medications  Recheck DEXA  Schedule Pneumonia, Tetanus vaccines   Plan: CPP follow-up 6 months   Subjective: Kristi Hunter is an 80 y.o. year old female who is a primary patient of [name], Mortimer Fries, MD.  The CCM team was consulted for assistance with disease management and care coordination needs.    Engaged with patient by telephone for follow up visit in response to provider referral for pharmacy case management and/or care coordination services.   Consent to Services:  The patient was given information about Chronic Care Management services, agreed to services, and gave verbal consent prior to initiation of services.  Please see initial visit note for detailed documentation.   Patient Care Team: Libby Maw, MD as PCP - General (Family Medicine) Germaine Pomfret, [facility] as Pharmacist (Pharmacist) Rockwell Germany, RN as Oncology Nurse Navigator Tressie Ellis, Paulette Blanch, RN as Oncology Nurse Navigator Magrinat, Virgie Dad, MD as Consulting Physician (Oncology) Stark Klein, MD as Consulting Physician (General Surgery) Kyung Rudd, MD as Consulting Physician (Radiation Oncology) Starlyn Skeans, MD as Consulting Physician (Family Medicine) Gregor Hams, MD as Consulting Physician (Sports Medicine) Dohmeier, Asencion Partridge, MD as Consulting Physician (Neurology) Warden Fillers, MD as Consulting Physician (Ophthalmology) Robley Fries, MD as Consulting Physician (Urology)  Recent office visits: 01/29/21: Video visit with Dr. [name] for Rosacea. Metronidazole 1% cream.    Recent consult visits: 03/13/21: Patient presented to Dr. Leafy Ro [facility] Management) for follow-up. 02/14/21: Patient presented to Dr. Jana Hakim (oncology) for breast cancer consult. Stage 1A, ER, PR +  01/18/21: Patient presented to Dr. Leafy Ro [facility] Management) for follow-up.  Hospital visits: None in previous 6 months   Objective:  Lab Results  Component Value Date   CREATININE 0.71 02/14/2021   BUN 18 02/14/2021   GFR 88.45 04/18/2020   GFRNONAA >60 02/14/2021   GFRAA 104 02/22/2020   NA 141 02/14/2021   K 3.9 02/14/2021   CALCIUM 9.1 02/14/2021   CO2 31 02/14/2021   GLUCOSE 89 02/14/2021    Lab Results  Component Value Date/Time   HGBA1C 5.8 (H) 02/19/2021 01:39 PM   HGBA1C 5.9 (H) 02/22/2020 01:39 PM   GFR 88.45 04/18/2020 09:42 AM   GFR 90.18 10/12/2019 01:48 PM   MICROALBUR 1.0 05/10/2019 10:18 AM    Last diabetic Eye exam: No results found for: HMDIABEYEEXA  Last diabetic Foot exam: No results found for: HMDIABFOOTEX   Lab Results  Component Value Date   CHOL 151 01/13/2019   HDL 44.90 01/13/2019   LDLCALC 92 01/13/2019   TRIG 72.0 01/13/2019   CHOLHDL 3 01/13/2019    Hepatic Function Latest Ref Rng & Units 02/14/2021 02/22/2020 01/13/2019  Total Protein 6.5 - 8.1 g/dL 7.0 6.5 7.2  Albumin 3.5 - 5.0 g/dL 3.0(L) 3.7 3.6  AST 15 - 41 U/L _0 ALT 0 - 44 U/L _1 Alk Phosphatase 38 - 126 U/L 62 85 73  Total Bilirubin 0.3 - 1.2 mg/dL 0.3 0.3 0.4    Lab Results  Component Value Date/Time   TSH 1.470 02/22/2020 01:39 PM   TSH 1.71 01/13/2019 09:13  AM   FREET4 1.27 02/22/2020 01:39 PM    CBC Latest Ref Rng & Units 02/14/2021 02/22/2020 05/10/2019  WBC 4.0 - 10.5 K/uL 8.6 8.1 6.9  Hemoglobin 12.0 - 15.0 g/dL 12.8 13.2 12.4  Hematocrit 36.0 - 46.0 % 39.3 41.3 38.6  Platelets 150 - 400 K/uL 291 332 306.0    Lab Results  Component Value Date/Time   VD25OH 42.1 02/22/2020 01:39 PM    Clinical ASCVD: No  The 10-year ASCVD risk score Mikey Bussing DC Jr., et al.,  2013) is: 22.8%   Values used to calculate the score:     Age: 75 years     Sex: Female     Is Non-Hispanic African American: No     Diabetic: No     Tobacco smoker: No     Systolic Blood Pressure: 277 mmHg     Is BP treated: Yes     HDL Cholesterol: 44.9 mg/dL     Total Cholesterol: 151 mg/dL    Depression screen [facility] 2/9 01/29/2021 08/01/2020 02/22/2020  Decreased Interest 0 0 2  Down, Depressed, Hopeless 0 0 2  PHQ - 2 Score 0 0 4  Altered sleeping - - 3  Tired, decreased energy - - 3  Change in appetite - - 2  Feeling bad or failure about yourself  - - 3  Trouble concentrating - - 2  Moving slowly or fidgety/restless - - 1  Suicidal thoughts - - 0  PHQ-9 Score - - 18  Difficult doing work/chores - - Very difficult     Last DEXA Scan: 01/22/19   T-Score femoral neck: -2.8  T-Score total hip: -2.0  T-Score lumbar spine: -0.9  T-Score forearm radius: NA  10-year probability of major osteoporotic fracture: NA  10-year probability of hip fracture: NA  Social History   Tobacco Use  Smoking Status Former   Types: Cigarettes   Quit date: 08/12/1982   Years since quitting: 38.6  Smokeless Tobacco Never   BP Readings from Last 3 Encounters:  03/13/21 121/81  02/20/21 135/67  02/19/21 (!) 159/88   Pulse Readings from Last 3 Encounters:  03/13/21 82  02/20/21 77  02/19/21 81   Wt Readings from Last 3 Encounters:  03/13/21 254 lb (115.2 kg)  02/20/21 253 lb 8.5 oz (115 kg)  02/19/21 253 lb 8 oz (115 kg)   BMI Readings from Last 3 Encounters:  03/13/21 44.99 kg/m  02/20/21 44.91 kg/m  02/19/21 44.91 kg/m    Assessment/Interventions: Review of patient past medical history, allergies, medications, health status, including review of consultants reports, laboratory and other test data, was performed as part of comprehensive evaluation and provision of chronic care management services.   SDOH:  (Social Determinants of Health) assessments and interventions performed:  Yes SDOH Interventions    Flowsheet Row Most Recent Value  SDOH Interventions   Financial Strain Interventions Intervention Not Indicated      SDOH Screenings   Alcohol Screen: Low Risk    Last Alcohol Screening Score (AUDIT): 2  Depression (PHQ2-9): Low Risk    PHQ-2 Score: 0  Financial Resource Strain: Low Risk    Difficulty of Paying Living Expenses: Not hard at all  Food Insecurity: No Food Insecurity   Worried About Charity fundraiser in the Last Year: Never true   Ran Out of Food in the Last Year: Never true  Housing: Low Risk    Last Housing Risk Score: 0  Physical Activity: Inactive   Days of  Exercise per Week: 0 days   Minutes of Exercise per Session: 0 min  Social Connections: Moderately Integrated   Frequency of Communication with Friends and Family: More than three times a week   Frequency of Social Gatherings with Friends and Family: More than three times a week   Attends Religious Services: More than 4 times per year   Active Member of [employer] or Organizations: Yes   Attends Archivist Meetings: 1 to 4 times per year   Marital Status: Widowed  Stress: No Stress Concern Present   Feeling of Stress : Only a little  Tobacco Use: Medium Risk   Smoking Tobacco Use: Former   Smokeless Tobacco Use: Never  Transportation Needs: No Data processing manager (Medical): No   Lack of Transportation (Non-Medical): No    CCM Care Plan  Allergies  Allergen Reactions   Percocet [Oxycodone-Acetaminophen] Other (See Comments)    hallucinations    Medications Reviewed Today     Reviewed by Teresita Madura, RMA (Registered Medical Assistant) on 03/13/21 at 1242  Med List Status: <None>   Medication Order Taking? Sig Documenting Provider Last Dose Status Informant  alendronate (FOSAMAX) 70 MG tablet [id number]  Take 1 tablet (70 mg total) by mouth every 7 (seven) days. Take with a full glass of water on an empty stomach.  Patient taking  differently: Take 70 mg by mouth every Friday. Take with a full glass of water on an empty stomach.   Dutch Quint B, FNP  Active Self  aspirin 81 MG EC tablet [id number]  Take 81 mg by mouth daily. Swallow whole. [provider]  Active Self  buPROPion (WELLBUTRIN SR) 150 MG 12 hr tablet [id number]  TAKE 1 TABLET (150 MG TOTAL) BY MOUTH IN THE MORNING. [name] D, MD  Active Self  chlorthalidone (HYGROTON) 25 MG tablet [id number]  TAKE 1 TABLET BY MOUTH EVERY DAY  Patient taking differently: Take 25 mg by mouth daily.   Dutch Quint B, FNP  Active Self  Lactobacillus (PROBIOTIC ACIDOPHILUS PO) [id number]  Take 1 capsule by mouth daily. [provider]  Active Self  Melatonin 10 MG TABS [id number]  Take 10 mg by mouth at bedtime as needed (sleep). [provider]  Active Self  metFORMIN (GLUCOPHAGE) 500 MG tablet [id number]  Take 1 tablet (500 mg total) by mouth 2 (two) times daily with a meal. [name] D, MD  Active Self  metroNIDAZOLE (METROGEL) 1 % gel [id number]  Apply topically daily.  Patient taking differently: Apply 1 application topically daily as needed (rosacea).   Libby Maw, MD  Active Self  Multiple Vitamin (MULTIVITAMIN) tablet [id number]  Take 1 tablet by mouth daily. [provider]  Active Self  naproxen (NAPROSYN) 500 MG tablet [id number]  Take one twice daily as needed for pain with food.  Patient taking differently: Take 500 mg by mouth 2 (two) times daily as needed for moderate pain. Take one twice daily as needed for pain with food.   Libby Maw, MD  Active Self  omega-3 fish oil (MAXEPA) 1000 MG CAPS capsule [id number]  Take 1 capsule by mouth daily.  [provider]  Active Self  traMADol (ULTRAM) 50 MG tablet [id number]  Take 1-2 tablets (50-100 mg total) by mouth every 6 (six) hours as needed for moderate pain or severe pain. Stark Klein, MD  Active   traZODone (DESYREL) 50 MG tablet [id number]   Take 50 mg by mouth  at bedtime as needed for sleep. [provider]  Active Self           Med Note Redmond Baseman, TEQUILA   Mon Jan 29, 2021 10:36 AM) prn  Turmeric 500 MG CAPS [id number]  Take 500 mg by mouth daily as needed (pain). [provider]  Active Self            Patient Active Problem List   Diagnosis Date Noted   Genetic testing 02/27/2021   Personal history of malignant neoplasm of uterus 02/14/2021   Family history of breast cancer 02/14/2021   Malignant neoplasm of upper-outer quadrant of right breast in female, estrogen receptor positive ([place]) 02/08/2021   Right knee pain 01/29/2021   Allergic rhinitis due to allergen 03/22/2020   OSA (obstructive sleep apnea) 03/22/2020   Left knee pain 01/18/2020   Other insomnia 01/18/2020   Apnea 01/18/2020   Class 3 severe obesity with serious comorbidity and body mass index (BMI) of 45.0 to 49.9 in adult ([place]) 01/18/2020   Musculoskeletal back pain 01/18/2020   Habitual snoring 10/12/2019   Nocturia 10/12/2019   OA (osteoarthritis) of knee 06/07/2019   Prediabetes 05/10/2019   Glaucoma of both eyes 05/10/2019   Essential hypertension 05/10/2019   Dry scalp 05/10/2019   Age-related osteoporosis without current pathological fracture 05/10/2019   Seborrheic keratosis 01/11/2019   Rosacea 01/11/2019   Family history of colon cancer 01/11/2019   Edema 08/27/2018   Primary osteoarthritis 08/27/2018   Sciatica of right side 08/27/2018   Depression, major, single episode, complete remission ([place]) 08/27/2018   Vitamin D deficiency 08/27/2018   Mixed hyperlipidemia 08/27/2018   Anxiety 08/27/2018   Hyperglycemia 08/27/2018   HTN (hypertension), benign 08/27/2018    Immunization History  Administered Date(s) Administered   Fluad Quad(high Dose 65+) 05/25/2019, 05/22/2020   Influenza,inj,Quad PF,6+ Mos 05/12/2018   PFIZER Comirnaty(Gray Top)Covid-19 Tri-Sucrose Vaccine 02/06/2021   PFIZER(Purple Top)SARS-COV-2  Vaccination 10/03/2019, 10/26/2019, 06/16/2020    Conditions to be addressed/monitored:  Hypertension, Hyperlipidemia, Depression, Osteoporosis, Osteoarthritis, Allergic Rhinitis, and Prediabetes  Care Plan : General Pharmacy (Adult)  Updates made by Germaine Pomfret, [place] since 04/02/2021 12:00 AM     Problem: Hypertension, Hyperlipidemia, Depression, Osteoporosis, Osteoarthritis, Allergic Rhinitis, and Prediabetes   Priority: High     Long-Range Goal: Patient-Specific Goal   Start Date: 04/02/2021  Expected End Date: 04/02/2022  This Visit's Progress: On track  Priority: High  Note:   Current Barriers:  No barriers noted  Pharmacist Clinical Goal(s):  Patient will maintain control of blood pressure as evidenced by BP less than 140/90  through collaboration with PharmD and provider.   Interventions: 1:1 collaboration with Libby Maw, MD regarding development and update of comprehensive plan of care as evidenced by provider attestation and co-signature Inter-disciplinary care team collaboration (see longitudinal plan of care) Comprehensive medication review performed; medication list updated in electronic medical record  Hypertension (BP goal <140/90) -Controlled -Current treatment: Chlorthalidone 25 mg daily  -Medications previously tried: HCTZ  -Current home readings: NA -Current dietary habits: Trying to calorie restrict, watch simple carbohydrates.  -Current exercise habits: Silver sneakers twice weekly. A little bit of walking, but limited due to knee pain.  -Denies hypotensive/hypertensive symptoms -Educated on Daily salt intake goal < 2300 mg; -Counseled to monitor BP at home weekly, document, and provide log at future appointments -Recommended to continue current medication  Hyperlipidemia: (LDL goal < 100) -Controlled -Current treatment: None -Medications previously tried: NA  -Counseled on diet and  exercise extensively  Prediabetes (A1c goal  <6.5%) -Controlled -Current medications: Metformin 500 mg twice daily  -Medications previously tried: NA  -Recommended to continue current medication  Depression/Anxiety (Goal: Maintain stable mood) -Controlled -Current treatment: Wellbutrin SR 150 mg daily  -Medications previously tried/failed: NA -PHQ9: 0 -GAD7: 0 -Feels mood is well controlled despite recent cancer diagnosis. Anxiety mostly related to claustrophobia. Wellbutrin has been beneficial at helping manage her food cravings.  -Recommended to continue current medication  Osteoporosis (Goal Prevent bone fractures) -Controlled -Patient is a candidate for pharmacologic treatment due to T-Score < -2.5 in femoral neck -Current treatment  Alendronate 70 mg weekly (started Dec 2021)  -Medications previously tried: NA  -Counseled on oral bisphosphonate administration: take in the morning, 30 minutes prior to food with 6-8 oz of water. Do not lie down for at least 30 minutes after taking. -Recommended to continue current medication -Recheck DEXA   Patient Goals/Self-Care Activities Patient will:  - check blood pressure weekly, document, and provide at future appointments  Follow Up Plan: Telephone follow up appointment with care management team member scheduled for:  10/04/21 at 8:30 AM      Medication Assistance: None required.  Patient affirms current coverage meets needs.  Compliance/Adherence/Medication fill history: Care Gaps: Hepatitis C screening Tetanus Vaccine Shingrix Vaccine  PNA Vaccine  Star-Rating Drugs: Metformin 500 mg: LF 8/7//22 for 30-DS.   Patient[facility]  [id number] [place] [address]Phone: [phone]: [phone] Uses pill box? Yes Pt endorses 100% compliance  We discussed: Current pharmacy is preferred with insurance plan and patient is satisfied with pharmacy services Patient decided to:  Continue current medication management strategy  Care Plan and Follow Up Patient Decision:  Patient agrees to Care Plan and Follow-up.  Plan: Telephone follow up appointment with care management team member scheduled for:  10/04/21 at 8:30 AM  AJunius Argyle PharmD, BPara March CPP Clinical Pharmacist [facility] [phone]

## 2021-04-03 ENCOUNTER — Ambulatory Visit: Payer: Medicare HMO

## 2021-04-04 ENCOUNTER — Ambulatory Visit: Payer: Medicare HMO

## 2021-04-05 ENCOUNTER — Ambulatory Visit: Payer: Medicare HMO

## 2021-04-06 ENCOUNTER — Ambulatory Visit: Payer: Medicare HMO

## 2021-04-09 ENCOUNTER — Ambulatory Visit: Payer: Medicare HMO

## 2021-04-10 ENCOUNTER — Ambulatory Visit: Payer: Medicare HMO

## 2021-04-11 ENCOUNTER — Ambulatory Visit: Payer: Medicare HMO

## 2021-04-12 ENCOUNTER — Other Ambulatory Visit: Payer: Self-pay

## 2021-04-12 ENCOUNTER — Ambulatory Visit: Payer: Medicare HMO

## 2021-04-12 ENCOUNTER — Ambulatory Visit (INDEPENDENT_AMBULATORY_CARE_PROVIDER_SITE_OTHER): Payer: Medicare HMO | Admitting: Family Medicine

## 2021-04-12 ENCOUNTER — Encounter (INDEPENDENT_AMBULATORY_CARE_PROVIDER_SITE_OTHER): Payer: Self-pay | Admitting: Family Medicine

## 2021-04-12 VITALS — BP 133/68 | HR 92 | Temp 97.6°F | Ht 63.0 in | Wt 250.0 lb

## 2021-04-12 DIAGNOSIS — Z6841 Body Mass Index (BMI) 40.0 and over, adult: Secondary | ICD-10-CM | POA: Diagnosis not present

## 2021-04-12 DIAGNOSIS — Z9189 Other specified personal risk factors, not elsewhere classified: Secondary | ICD-10-CM | POA: Diagnosis not present

## 2021-04-12 DIAGNOSIS — F3289 Other specified depressive episodes: Secondary | ICD-10-CM | POA: Diagnosis not present

## 2021-04-12 DIAGNOSIS — E559 Vitamin D deficiency, unspecified: Secondary | ICD-10-CM | POA: Diagnosis not present

## 2021-04-12 DIAGNOSIS — R7303 Prediabetes: Secondary | ICD-10-CM

## 2021-04-12 DIAGNOSIS — R69 Illness, unspecified: Secondary | ICD-10-CM | POA: Diagnosis not present

## 2021-04-12 MED ORDER — BUPROPION HCL ER (SR) 150 MG PO TB12: 150.0000 mg | ORAL_TABLET | Freq: Every morning | ORAL | 0 refills | Status: DC

## 2021-04-12 MED ORDER — METFORMIN HCL 500 MG PO TABS: 500.0000 mg | ORAL_TABLET | Freq: Two times a day (BID) | ORAL | 0 refills | Status: DC

## 2021-04-12 NOTE — Progress Notes (Signed)
Chief Complaint:   OBESITY Kristi Hunter is here to discuss her progress with her obesity treatment plan along with follow-up of her obesity related diagnoses. Kristi Hunter is on the Category 2 Plan and states she is following her eating plan approximately 80% of the time. Kristi Hunter states she is doing Chief of Staff for 60 minutes 3 times per week.  Today's visit was #: 18 Starting weight: 267 lbs Starting date: 02/22/2020 Today's weight: 250 lbs Today's date: 04/12/2021 Total lbs lost to date: 17 Total lbs lost since last in-office visit: 4  Interim History: Kristi Hunter continues to work on diet and weight loss. She has done well increasing her activity and is on her way to Silver sneakers class after this visit. Her hunger is mostly controlled.  Subjective:   1. Pre-diabetes Kristi Hunter is stable on metformin, and she is doing well on diet and weight loss. She is due for labs.  2. Vitamin D deficiency Kristi Hunter is stable on multivitamins, and she denies signs of over-replacement.  3. Other depression with emotional eating Kristi Hunter is working on decreasing emotional eating behaviors. She is stable on her medications and she requests a refill today.  4. At risk for diabetes mellitus Kristi Hunter is at higher than average risk for developing diabetes due to obesity.   Assessment/Plan:   1. Pre-diabetes Kristi Hunter will continue to work on weight loss, exercise, and decreasing simple carbohydrates to help decrease the risk of diabetes. We will check labs today, and we will refill metformin for 1 month.  - metFORMIN (GLUCOPHAGE) 500 MG tablet; Take 1 tablet (500 mg total) by mouth 2 (two) times daily with a meal.  Dispense: 60 tablet; Refill: 0 - CMP14+EGFR - Lipid Panel With LDL/HDL Ratio - Insulin, random - Hemoglobin A1c  2. Vitamin D deficiency Low Vitamin D level contributes to fatigue and are associated with obesity, breast, and colon cancer. We will refill prescription Vitamin D  for 1 month. Kristi Hunter will follow-up for routine testing of Vitamin D, at least 2-3 times per year to avoid over-replacement.  - VITAMIN D 25 Hydroxy (Vit-D Deficiency, Fractures)  3. Other depression with emotional eating Behavior modification techniques were discussed today to help Kristi Hunter deal with her emotional/non-hunger eating behaviors. We will refill Wellbutrin SR for 1 month. Orders and follow up as documented in patient record.   - buPROPion (WELLBUTRIN SR) 150 MG 12 hr tablet; Take 1 tablet (150 mg total) by mouth in the morning.  Dispense: 30 tablet; Refill: 0  4. At risk for diabetes mellitus Kristi Hunter was given approximately 15 minutes of diabetes education and counseling today. We discussed intensive lifestyle modifications today with an emphasis on weight loss as well as increasing exercise and decreasing simple carbohydrates in her diet. We also reviewed medication options with an emphasis on risk versus benefit of those discussed.   Repetitive spaced learning was employed today to elicit superior memory formation and behavioral change.  5. Obesity with current BMI 44.3 Kristi Hunter is currently in the action stage of change. As such, her goal is to continue with weight loss efforts. She has agreed to keeping a food journal and adhering to recommended goals of 1400-1600 calories and 95+ grams of protein daily.   Exercise goals: As is.  Behavioral modification strategies: travel eating strategies and celebration eating strategies.  Kristi Hunter has agreed to follow-up with our clinic in 3 to 4 weeks. She was informed of the importance of frequent follow-up visits to maximize her success with intensive lifestyle modifications  for her multiple health conditions.   Kristi Hunter was informed we would discuss her lab results at her next visit unless there is a critical issue that needs to be addressed sooner. Kristi Hunter agreed to keep her next visit at the agreed upon time to discuss these  results.  Objective:   Blood pressure 133/68, pulse 92, temperature 97.6 F (36.4 C), height _0  (1.6 m), weight 250 lb (113.4 kg), SpO2 95 %. Body mass index is 44.29 kg/m.  General: Cooperative, alert, well developed, in no acute distress. HEENT: Conjunctivae and lids unremarkable. Cardiovascular: Regular rhythm.  Lungs: Normal work of breathing. Neurologic: No focal deficits.   Lab Results  Component Value Date   CREATININE 0.71 02/14/2021   BUN 18 02/14/2021   NA 141 02/14/2021   K 3.9 02/14/2021   CL 104 02/14/2021   CO2 31 02/14/2021   Lab Results  Component Value Date   ALT 21 02/14/2021   AST 17 02/14/2021   ALKPHOS 62 02/14/2021   BILITOT 0.3 02/14/2021   Lab Results  Component Value Date   HGBA1C 5.8 (H) 02/19/2021   HGBA1C 5.9 (H) 02/22/2020   HGBA1C 5.9 05/10/2019   HGBA1C 6.0 01/13/2019   Lab Results  Component Value Date   INSULIN 21.5 02/22/2020   Lab Results  Component Value Date   TSH 1.470 02/22/2020   Lab Results  Component Value Date   CHOL 151 01/13/2019   HDL 44.90 01/13/2019   LDLCALC 92 01/13/2019   TRIG 72.0 01/13/2019   CHOLHDL 3 01/13/2019   Lab Results  Component Value Date   VD25OH 42.1 02/22/2020   Lab Results  Component Value Date   WBC 8.6 02/14/2021   HGB 12.8 02/14/2021   HCT 39.3 02/14/2021   MCV 85.6 02/14/2021   PLT 291 02/14/2021   No results found for: IRON, TIBC, FERRITIN  Attestation Statements:   Reviewed by clinician on day of visit: allergies, medications, problem list, medical history, surgical history, family history, social history, and previous encounter notes.   I, Trixie Dredge, am acting as transcriptionist for [name], MD.  I have reviewed the above documentation for accuracy and completeness, and I agree with the above. -  [name], MD

## 2021-04-13 ENCOUNTER — Ambulatory Visit: Payer: Medicare HMO

## 2021-04-13 LAB — HEMOGLOBIN A1C
Est. average glucose Bld gHb Est-mCnc: 117 mg/dL
Hgb A1c MFr Bld: 5.7 % — ABNORMAL HIGH (ref 4.8–5.6)

## 2021-04-13 LAB — LIPID PANEL WITH LDL/HDL RATIO
Cholesterol, Total: 169 mg/dL (ref 100–199)
HDL: 46 mg/dL (ref >39)
LDL Chol Calc (NIH): 110 mg/dL — ABNORMAL HIGH (ref 0–99)
LDL/HDL Ratio: 2.4 ratio (ref 0.0–3.2)
Triglycerides: 69 mg/dL (ref 0–149)
VLDL Cholesterol Cal: 13 mg/dL (ref 5–40)

## 2021-04-13 LAB — CMP14+EGFR
ALT: 19 IU/L (ref 0–32)
AST: 17 IU/L (ref 0–40)
Albumin/Globulin Ratio: 1.4 (ref 1.2–2.2)
Albumin: 3.9 g/dL (ref 3.7–4.7)
Alkaline Phosphatase: 77 IU/L (ref 44–121)
BUN/Creatinine Ratio: 23 (ref 12–28)
BUN: 16 mg/dL (ref 8–27)
Bilirubin Total: 0.3 mg/dL (ref 0.0–1.2)
CO2: 30 mmol/L — ABNORMAL HIGH (ref 20–29)
Calcium: 9.4 mg/dL (ref 8.7–10.3)
Chloride: 100 mmol/L (ref 96–106)
Creatinine, Ser: 0.7 mg/dL (ref 0.57–1.00)
Globulin, Total: 2.7 g/dL (ref 1.5–4.5)
Glucose: 93 mg/dL (ref 65–99)
Potassium: 3.8 mmol/L (ref 3.5–5.2)
Sodium: 141 mmol/L (ref 134–144)
Total Protein: 6.6 g/dL (ref 6.0–8.5)
eGFR: 89 mL/min/{1.73_m2} (ref >59)

## 2021-04-13 LAB — VITAMIN D 25 HYDROXY (VIT D DEFICIENCY, FRACTURES): Vit D, 25-Hydroxy: 46.8 ng/mL (ref 30.0–100.0)

## 2021-04-13 LAB — INSULIN, RANDOM: INSULIN: 37.9 u[IU]/mL — ABNORMAL HIGH (ref 2.6–24.9)

## 2021-04-17 ENCOUNTER — Ambulatory Visit: Payer: Medicare HMO

## 2021-04-17 DIAGNOSIS — R350 Frequency of micturition: Secondary | ICD-10-CM | POA: Diagnosis not present

## 2021-04-18 ENCOUNTER — Other Ambulatory Visit (INDEPENDENT_AMBULATORY_CARE_PROVIDER_SITE_OTHER): Payer: Self-pay | Admitting: Family Medicine

## 2021-04-18 ENCOUNTER — Other Ambulatory Visit (INDEPENDENT_AMBULATORY_CARE_PROVIDER_SITE_OTHER): Payer: Self-pay | Admitting: Emergency Medicine

## 2021-04-18 DIAGNOSIS — R7303 Prediabetes: Secondary | ICD-10-CM

## 2021-04-18 NOTE — Telephone Encounter (Signed)
Dr.Beasley 

## 2021-04-22 ENCOUNTER — Other Ambulatory Visit: Payer: Self-pay | Admitting: Family

## 2021-04-22 DIAGNOSIS — I10 Essential (primary) hypertension: Secondary | ICD-10-CM

## 2021-04-23 ENCOUNTER — Other Ambulatory Visit: Payer: Medicare HMO

## 2021-04-23 ENCOUNTER — Ambulatory Visit: Payer: Medicare HMO | Admitting: Oncology

## 2021-04-24 ENCOUNTER — Encounter: Payer: Self-pay | Admitting: *Deleted

## 2021-04-25 ENCOUNTER — Other Ambulatory Visit (INDEPENDENT_AMBULATORY_CARE_PROVIDER_SITE_OTHER): Payer: Self-pay | Admitting: Family Medicine

## 2021-04-25 DIAGNOSIS — F3289 Other specified depressive episodes: Secondary | ICD-10-CM

## 2021-04-25 DIAGNOSIS — R7303 Prediabetes: Secondary | ICD-10-CM

## 2021-04-25 NOTE — Telephone Encounter (Signed)
LAST APPOINTMENT DATE: 04/12/2021 NEXT APPOINTMENT DATE: 05/15/2021   [id number] [place] [address] [address]Phone: [phone]: [phone] Patient is requesting a refill of the following medications: Requested Prescriptions   Pending Prescriptions Disp Refills   metFORMIN (GLUCOPHAGE) 500 MG tablet [Pharmacy Med Name: METFORMIN HCL 500 MG TABLET] 60 tablet 0    Sig: TAKE 1 TABLET BY MOUTH 2 TIMES DAILY WITH A MEAL.    Date last filled: 04/12/2021 [facility]  Component Value Date   HGBA1C 5.7 (H) 04/12/2021   HGBA1C 5.8 (H) 02/19/2021   HGBA1C 5.9 (H) 02/22/2020   Lab Results  Component Value Date   MICROALBUR 1.0 05/10/2019   LDLCALC 110 (H) 04/12/2021   CREATININE 0.70 04/12/2021   Lab Results  Component Value Date   VD25OH 46.8 04/12/2021   VD25OH 42.1 02/22/2020    BP Readings from Last 3 Encounters:  04/12/21 133/68  03/13/21 121/81  02/20/21 135/67

## 2021-04-25 NOTE — Telephone Encounter (Signed)
LAST APPOINTMENT DATE: 04/12/2021  NEXT APPOINTMENT DATE: 05/15/2021   [id number] [place] [address] [address]Phone: [phone]: [phone] Patient is requesting a refill of the following medications: Requested Prescriptions   Pending Prescriptions Disp Refills   buPROPion (WELLBUTRIN SR) 150 MG 12 hr tablet [Pharmacy Med Name: BUPROPION HCL SR 150 MG TABLET] 30 tablet 0    Sig: TAKE 1 TABLET (150 MG TOTAL) BY MOUTH IN THE MORNING.    Date last filled: 04/12/2021 [facility] Results  Component Value Date   HGBA1C 5.7 (H) 04/12/2021   HGBA1C 5.8 (H) 02/19/2021   HGBA1C 5.9 (H) 02/22/2020   Lab Results  Component Value Date   MICROALBUR 1.0 05/10/2019   LDLCALC 110 (H) 04/12/2021   CREATININE 0.70 04/12/2021   Lab Results  Component Value Date   VD25OH 46.8 04/12/2021   VD25OH 42.1 02/22/2020    BP Readings from Last 3 Encounters:  04/12/21 133/68  03/13/21 121/81  02/20/21 135/67

## 2021-04-26 ENCOUNTER — Telehealth: Payer: Self-pay | Admitting: Family Medicine

## 2021-04-26 NOTE — Telephone Encounter (Signed)
Pt called requesting updated injections, Tdap, flu shot ("for seniors"), "Hep C test", Labs done for diabetes that Dr [name] has been telling her about. I wasn't sure if this lab she discussed had been ordered, so I could not schedule her for simply "labs". Please advise... the conversation was a bit all over the place, and I just want to be sure she gets what she needs.  Thank you, WM

## 2021-04-26 NOTE — Telephone Encounter (Signed)
Would you let patient know that she needs to come in for office visit with Dr. [name] she have not been seen (in office) since September 2021 she will need to come in to discuss requested vaccines and have evaluation. Schedule she possible.

## 2021-04-27 DIAGNOSIS — G4733 Obstructive sleep apnea (adult) (pediatric): Secondary | ICD-10-CM | POA: Diagnosis not present

## 2021-04-27 NOTE — Telephone Encounter (Signed)
Scheduled pt for 9/29

## 2021-04-30 DIAGNOSIS — G4733 Obstructive sleep apnea (adult) (pediatric): Secondary | ICD-10-CM | POA: Diagnosis not present

## 2021-05-02 ENCOUNTER — Inpatient Hospital Stay: Payer: Medicare HMO | Attending: Oncology

## 2021-05-02 ENCOUNTER — Inpatient Hospital Stay (HOSPITAL_BASED_OUTPATIENT_CLINIC_OR_DEPARTMENT_OTHER): Payer: Medicare HMO | Admitting: Oncology

## 2021-05-02 ENCOUNTER — Other Ambulatory Visit: Payer: Self-pay

## 2021-05-02 VITALS — BP 139/79 | HR 93 | Temp 97.7°F | Resp 18 | Ht 63.0 in | Wt 253.0 lb

## 2021-05-02 DIAGNOSIS — M810 Age-related osteoporosis without current pathological fracture: Secondary | ICD-10-CM | POA: Diagnosis not present

## 2021-05-02 DIAGNOSIS — Z170 Estrogen receptor positive status [ER+]: Secondary | ICD-10-CM | POA: Diagnosis not present

## 2021-05-02 DIAGNOSIS — Z79810 Long term (current) use of selective estrogen receptor modulators (SERMs): Secondary | ICD-10-CM | POA: Insufficient documentation

## 2021-05-02 DIAGNOSIS — Z8542 Personal history of malignant neoplasm of other parts of uterus: Secondary | ICD-10-CM | POA: Diagnosis not present

## 2021-05-02 DIAGNOSIS — Z90710 Acquired absence of both cervix and uterus: Secondary | ICD-10-CM | POA: Diagnosis not present

## 2021-05-02 DIAGNOSIS — C50411 Malignant neoplasm of upper-outer quadrant of right female breast: Secondary | ICD-10-CM | POA: Insufficient documentation

## 2021-05-02 LAB — COMPREHENSIVE METABOLIC PANEL
ALT: 20 U/L (ref 0–44)
AST: 20 U/L (ref 15–41)
Albumin: 3.4 g/dL — ABNORMAL LOW (ref 3.5–5.0)
Alkaline Phosphatase: 74 U/L (ref 38–126)
Anion gap: 10 (ref 5–15)
BUN: 14 mg/dL (ref 8–23)
CO2: 29 mmol/L (ref 22–32)
Calcium: 9.7 mg/dL (ref 8.9–10.3)
Chloride: 99 mmol/L (ref 98–111)
Creatinine, Ser: 0.74 mg/dL (ref 0.44–1.00)
GFR, Estimated: >60 mL/min (ref >60)
Glucose, Bld: 104 mg/dL — ABNORMAL HIGH (ref 70–99)
Potassium: 3.6 mmol/L (ref 3.5–5.1)
Sodium: 138 mmol/L (ref 135–145)
Total Bilirubin: 0.4 mg/dL (ref 0.3–1.2)
Total Protein: 7.3 g/dL (ref 6.5–8.1)

## 2021-05-02 LAB — CBC WITH DIFFERENTIAL/PLATELET
Abs Immature Granulocytes: 0.04 10*3/uL (ref 0.00–0.07)
Basophils Absolute: 0.1 10*3/uL (ref 0.0–0.1)
Basophils Relative: 1 %
Eosinophils Absolute: 0.2 10*3/uL (ref 0.0–0.5)
Eosinophils Relative: 2 %
HCT: 39.7 % (ref 36.0–46.0)
Hemoglobin: 12.7 g/dL (ref 12.0–15.0)
Immature Granulocytes: 0 %
Lymphocytes Relative: 24 %
Lymphs Abs: 2.8 10*3/uL (ref 0.7–4.0)
MCH: 27.5 pg (ref 26.0–34.0)
MCHC: 32 g/dL (ref 30.0–36.0)
MCV: 85.9 fL (ref 80.0–100.0)
Monocytes Absolute: 0.7 10*3/uL (ref 0.1–1.0)
Monocytes Relative: 6 %
Neutro Abs: 7.9 10*3/uL — ABNORMAL HIGH (ref 1.7–7.7)
Neutrophils Relative %: 67 %
Platelets: 350 10*3/uL (ref 150–400)
RBC: 4.62 MIL/uL (ref 3.87–5.11)
RDW: 13.2 % (ref 11.5–15.5)
WBC: 11.8 10*3/uL — ABNORMAL HIGH (ref 4.0–10.5)
nRBC: 0 % (ref 0.0–0.2)

## 2021-05-02 MED ORDER — TAMOXIFEN CITRATE 20 MG PO TABS: 20.0000 mg | ORAL_TABLET | Freq: Every day | ORAL | 4 refills | Status: AC

## 2021-05-02 NOTE — Progress Notes (Signed)
[place]  [phone] [phone]     ID: [name] DOB: 1940/11/06  MR#: [id number]  IZT#:[id number]  Patient Care Team: Libby Maw, MD as PCP - General (Family Medicine) Germaine Pomfret, [facility] as Pharmacist (Pharmacist) Rockwell Germany, RN as Oncology Nurse Navigator Mauro Kaufmann, RN as Oncology Nurse Navigator Scout Gumbs, Virgie Dad, MD as Consulting Physician (Oncology) Stark Klein, MD as Consulting Physician (General Surgery) Kyung Rudd, MD as Consulting Physician (Radiation Oncology) Starlyn Skeans, MD as Consulting Physician (Family Medicine) Gregor Hams, MD as Consulting Physician (Sports Medicine) Dohmeier, Asencion Partridge, MD as Consulting Physician (Neurology) Warden Fillers, MD as Consulting Physician (Ophthalmology) Robley Fries, MD as Consulting Physician (Urology) Chauncey Cruel, MD OTHER MD:  CHIEF COMPLAINT: Estrogen receptor positive breast cancer  CURRENT TREATMENT: Tamoxifen   INTERVAL HISTORY: Kristi Hunter returns today for follow up of her estrogen receptor positive breast cancer. She was evaluated in the multidisciplinary breast cancer clinic on 02/14/2021.  She underwent genetic testing during clinic. Results were negative.  She proceeded to right lumpectomy on 02/20/2021 under Dr. Barry Dienes. Pathology from the procedure [id number]) showed: invasive and in situ ductal carcinoma, 1.2 cm; margins negative (closest is 1 mm from medial margin).  Oncotype DX was obtained on the final surgical sample and the recurrence score of 8 predicts a risk of recurrence outside the breast over the next 9 years of 3%, if the patient's only systemic therapy is an antiestrogen for 5 years.  It also predicts no benefit from chemotherapy.   She was referred back to Dr. Lisbeth Renshaw on 03/20/2021 to review radiation therapy. They discussed forgoing treatment, but she opted to proceed. However, when she returned for simulation,  she was unable to tolerate the treatment because of claustrophobia.  In the end she decided to forego radiation treatments  REVIEW OF SYSTEMS: Kristi Hunter has significant knee problems and arthritis and this limits her mobility.  She does Silver sneakers every day though of virtually and she is planning to move to [place] mid November of this year at which time she will start pool exercises regularly.  [facility] she says.  Her claustrophobia is significant and she does not know where she can go to have a mammogram that does not require her to be in an elevator.  Aside from these issues a detailed review of systems today was stable   COVID 19 VACCINATION STATUS: [place] x3, most recently 06/2020   HISTORY OF CURRENT ILLNESS: From the original intake note:  "Kristi Hunter" had routine screening mammography on 01/12/2021 showing a possible abnormality in the right breast. She underwent right breast ultrasonography at [facility] on 02/01/2021 showing: 10 mm right breast mass at 10 o'clock; no right axillary adenopathy.  Accordingly on 02/05/2021 she proceeded to biopsy of the right breast area in question. The pathology from this procedure ([id number]) showed: invasive ductal carcinoma with calcifications, grade 2; ductal carcinoma in situ, intermediate grade. Prognostic indicators significant for: estrogen receptor, 100% positive and progesterone receptor, 90% positive, both with strong staining intensity. Proliferation marker Ki67 at 20%. HER2 equivocal by immunohistochemistry (2+), but negative by fluorescent in situ hybridization (as reported on conference 02/14/2021).  Cancer Staging Malignant neoplasm of upper-outer quadrant of right breast in female, estrogen receptor positive ([place]) Staging form: Breast, AJCC 8th Edition - Clinical stage from 02/14/2021: Stage IA (cT1b, cN0, cM0, G2, ER+,  PR+, HER2-) - Signed by Chauncey Cruel, MD  on 02/14/2021 Stage prefix: Initial diagnosis Histologic grading system: 3 grade system  Of note, she has a history of uterine cancer, diagnosed in 2011.  The patient's subsequent history is as detailed below.   PAST MEDICAL HISTORY: Past Medical History:  Diagnosis Date   Anemia    Anxiety    Arthritis    Back pain    Breast cancer (HCC)    Cancer ([place])    uterine   Chest pain    Chicken pox    Chronic kidney disease    Constipation    Emphysema of lung (HCC)    Family history of breast cancer 02/14/2021   Glaucoma    Hypertension    Joint pain    Lower extremity edema    Lymphedema    Osteoarthritis    Personal history of malignant neoplasm of uterus 02/14/2021   Pre-diabetes    Prediabetes    Shortness of breath    Sleep apnea    Urine incontinence    Uterine cancer ([place])    UTI (urinary tract infection)    Vertigo    Vitamin B 12 deficiency    during pregnancy    PAST SURGICAL HISTORY: Past Surgical History:  Procedure Laterality Date   ABDOMINAL HYSTERECTOMY  2011   BREAST LUMPECTOMY WITH RADIOACTIVE SEED LOCALIZATION Right 02/20/2021   Procedure: RIGHT BREAST LUMPECTOMY WITH RADIOACTIVE SEED LOCALIZATION;  Surgeon: Stark Klein, MD;  Location: [facility];  Service: General;  Laterality: Right;   REPLACEMENT TOTAL KNEE Right 2012    FAMILY HISTORY: Family History  Problem Relation Age of Onset   Heart attack Mother    Heart disease Mother    Hyperlipidemia Mother    Hypertension Mother    Stroke Mother    Miscarriages / [name] Mother    Diabetes Mother    Thyroid disease Mother    Cancer Mother    Obesity Mother    Melanoma Mother        dx after 62, sun-exposed areas   Cancer Father        skin   Alcohol abuse Father    Asthma Father    COPD Father    Heart attack Father    Heart disease Father    Obesity Father    Melanoma Father        dx after 40, back   Alcohol abuse Brother    Arthritis Brother    Drug abuse Brother    Alcohol  abuse Brother    Cancer Brother        ? esophageal, d. 57   Breast cancer Paternal Aunt 78   Arthritis Maternal Grandmother    Diabetes Maternal Grandmother    Hyperlipidemia Maternal Grandmother    Colon cancer Maternal Grandfather        dx 20, dx 71   Alcohol abuse Paternal Grandfather    Arthritis Paternal Grandfather    Cirrhosis Paternal Grandfather    Depression Daughter    Hypertension Daughter    Mental illness Daughter    Colon polyps Daughter    Depression Son    Her father died at age 57 from COPD. Her mother died at age 57 from CHF. Kristi Hunter has four brothers and one sister. She reports breast cancer in a paternal aunt at age 82, colon cancer in her maternal grandfather at age 71 and again at age 89, and melanoma on both sides of her family.  GYNECOLOGIC HISTORY:  No LMP recorded. Patient has had a hysterectomy. Menarche: 80 years old Age at first live birth: 80 years old [place] P 3 LMP 1990 Contraceptive: used oral contraceptives for 3 years and IUD from [place] HRT never used  Hysterectomy? Yes, 2011 for uterine cancer BSO? yes   SOCIAL HISTORY: (updated 02/2021)  [name] "Kristi Hunter" is currently retired from working as a Engineer, maintenance (IT). She is widowed. Her late husband, Trilby Drummer, was a Building services engineer. She lives at home by herself, with no pets. Daughter Robbie Lis, age 61, is a retired Chief Technology Officer living in [place]. Son Elyse Jarvis, age 40, is an Optometrist in [place]. Son Precious Gilding, age 90, is a Librarian, academic for state agency in [place], [place]. Kristi Hunter has two grandchildren and one great-grandchild. She attends a [employer] in [place].    ADVANCED DIRECTIVES: in place, sister Carole Civil is her HCPOA   HEALTH MAINTENANCE: Social History   Tobacco Use   Smoking status: Former    Types: Cigarettes    Quit date: 08/12/1982    Years since quitting: 38.7   Smokeless tobacco: Never  Vaping Use   Vaping Use: Never used   Substance Use Topics   Alcohol use: Yes    Alcohol/week: 1.0 standard drink    Types: 1 Glasses of wine per week    Comment: every 1-2 wks.    Drug use: Never     Colonoscopy: never done (notes she can't do prep, has completed Cologard)  PAP: 2016  Bone density: 01/2019, -2.8   Allergies  Allergen Reactions   Percocet [Oxycodone-Acetaminophen] Other (See Comments)    hallucinations    Current Outpatient Medications  Medication Sig Dispense Refill   alendronate (FOSAMAX) 70 MG tablet Take 1 tablet (70 mg total) by mouth every 7 (seven) days. Take with a full glass of water on an empty stomach. (Patient taking differently: No sig reported) 4 tablet 11   aspirin 81 MG EC tablet Take 81 mg by mouth daily. Swallow whole.     buPROPion (WELLBUTRIN SR) 150 MG 12 hr tablet Take 1 tablet (150 mg total) by mouth in the morning. 30 tablet 0   chlorthalidone (HYGROTON) 25 MG tablet TAKE 1 TABLET BY MOUTH EVERY DAY (Patient taking differently: Take 25 mg by mouth daily.) 90 tablet 1   diclofenac Sodium (VOLTAREN) 1 % GEL Apply 2 g topically daily as needed (Osteoarthritis).     Lactobacillus (PROBIOTIC ACIDOPHILUS PO) Take 1 capsule by mouth daily.     Melatonin 10 MG TABS Take 10 mg by mouth at bedtime as needed (sleep).     metFORMIN (GLUCOPHAGE) 500 MG tablet Take 1 tablet (500 mg total) by mouth 2 (two) times daily with a meal. 60 tablet 0   metroNIDAZOLE (METROGEL) 1 % gel Apply topically daily. (Patient taking differently: Apply 1 application topically daily as needed (rosacea).) 60 g 1   Multiple Vitamin (MULTIVITAMIN) tablet Take 1 tablet by mouth daily.     naproxen sodium (ALEVE) 220 MG tablet Take 220 mg by mouth daily as needed.     omega-3 fish oil (MAXEPA) 1000 MG CAPS capsule Take 1 capsule by mouth daily.      Turmeric 500 MG CAPS Take 500 mg by mouth daily as needed (pain).     Wheat Dextrin (BENEFIBER PO) Take by mouth.     No current facility-administered medications for  this visit.    OBJECTIVE: White woman who appears stated age  Vitals:   05/02/21 1624  BP: 139/79  Pulse: 93  Resp: 18  Temp: 97.7 F (36.5 C)  SpO2: 96%      Body mass index is 44.82 kg/m.   Wt Readings from Last 3 Encounters:  05/02/21 253 lb (114.8 kg)  04/12/21 250 lb (113.4 kg)  03/13/21 254 lb (115.2 kg)     ECOG FS:1 - Symptomatic but completely ambulatory  Sclerae unicteric, EOMs intact Wearing a mask No cervical or supraclavicular adenopathy Lungs no rales or rhonchi Heart regular rate and rhythm Abd soft, obese, nontender, positive bowel sounds MSK no focal spinal tenderness, no upper extremity lymphedema Neuro: nonfocal, well oriented, appropriate affect Breasts: The right breast is status post lumpectomy.  The cosmetic result is good.  There is no evidence of residual or recurrent disease.  Left breast and both axillae are benign   LAB RESULTS:  CMP     Component Value Date/Time   NA 138 05/02/2021 1614   NA 141 04/12/2021 0924   K 3.6 05/02/2021 1614   CL 99 05/02/2021 1614   CO2 29 05/02/2021 1614   GLUCOSE 104 (H) 05/02/2021 1614   BUN 14 05/02/2021 1614   BUN 16 04/12/2021 0924   CREATININE 0.74 05/02/2021 1614   CREATININE 0.71 02/14/2021 0826   CALCIUM 9.7 05/02/2021 1614   PROT 7.3 05/02/2021 1614   PROT 6.6 04/12/2021 0924   ALBUMIN 3.4 (L) 05/02/2021 1614   ALBUMIN 3.9 04/12/2021 0924   AST 20 05/02/2021 1614   AST 17 02/14/2021 0826   ALT 20 05/02/2021 1614   ALT 21 02/14/2021 0826   ALKPHOS 74 05/02/2021 1614   BILITOT 0.4 05/02/2021 1614   BILITOT 0.3 04/12/2021 0924   BILITOT 0.3 02/14/2021 0826   GFRNONAA >60 05/02/2021 1614   GFRNONAA >60 02/14/2021 0826   GFRAA 104 02/22/2020 1339    No results found for: TOTALPROTELP, ALBUMINELP, A1GS, A2GS, BETS, BETA2SER, GAMS, MSPIKE, SPEI  Lab Results  Component Value Date   WBC 11.8 (H) 05/02/2021   NEUTROABS 7.9 (H) 05/02/2021   HGB 12.7 05/02/2021   HCT 39.7 05/02/2021    MCV 85.9 05/02/2021   PLT 350 05/02/2021    No results found for: LABCA2  No components found for: [id number]  No results for input(s): INR in the last 168 hours.  No results found for: LABCA2  No results found for: ZCH885  No results found for: OYD741  No results found for: OIN867  No results found for: CA2729  No components found for: HGQUANT  No results found for: CEA1 / No results found for: CEA1   No results found for: AFPTUMOR  No results found for: CHROMOGRNA  No results found for: KPAFRELGTCHN, LAMBDASER, KAPLAMBRATIO (kappa/lambda light chains)  No results found for: HGBA, HGBA2QUANT, HGBFQUANT, HGBSQUAN (Hemoglobinopathy evaluation)   No results found for: LDH  No results found for: IRON, TIBC, IRONPCTSAT (Iron and TIBC)  No results found for: FERRITIN  Urinalysis    Component Value Date/Time   COLORURINE YELLOW 10/13/2019 [place] 10/13/2019 1613   LABSPEC 1.020 10/13/2019 1613   PHURINE 7.0 10/13/2019 1613   GLUCOSEU NEGATIVE 10/13/2019 1613   HGBUR NEGATIVE 10/13/2019 1613   BILIRUBINUR NEGATIVE 10/13/2019 1613   KETONESUR NEGATIVE 10/13/2019 1613   UROBILINOGEN 0.2 10/13/2019 1613   NITRITE NEGATIVE 10/13/2019 1613   LEUKOCYTESUR NEGATIVE 10/13/2019 1613     STUDIES: No results found.   ELIGIBLE FOR AVAILABLE RESEARCH PROTOCOL: no  ASSESSMENT: 80  y.o. [place] woman status post right breast upper outer quadrant biopsy 02/05/2021 for a clinical T1b N0, stage IA invasive ductal carcinoma, grade 2, estrogen and progesterone receptor positive, HER2 not amplified, with an MIB-1 of 20%.  (1) genetics testing  (A) Negative hereditary cancer genetic testing: no pathogenic variants detected in [facility] CustomNext-Cancer +RNAinsight Panel.  The report date is February 23, 2021.     The CustomNext-Cancer +RNAinsight Panel offered by [facility] and includes sequencing and rearrangement analysis for the following 91 genes: AIP, ALK,  APC, ATM, AXIN2, BAP1, BARD1, BLM, BMPR1A, BRCA1, BRCA2, BRIP1, CDC73, CDH1, CDK4, CDKN1B, CDKN2A, CHEK2, CTNNA1, DICER1, FANCC, FH, FLCN, GALNT12, KIF1B, LZTR1, MAX, MEN1, MET, MLH1, MRE11A, MSH2, MSH3, MSH6, MUTYH, NBN, NF1, NF2, NTHL1, PALB2, PHOX2B, PMS2, POT1, PRKAR1A, PTCH1, PTEN, RAD50, RAD51C, RAD51D, RB1, RECQL, RET, SDHA, SDHAF2, SDHB, SDHC, SDHD, SMAD4, SMARCA4, SMARCB1, SMARCE1, STK11, SUFU, TMEM127, TP53, TSC1, TSC2, VHL and XRCC2 (sequencing and deletion/duplication); CASR, CFTR, CPA1, CTRC, EGFR, EGLN1, FAM175A, HOXB13, KIT, MITF, MLH3, PALLD, PDGFRA, POLD1, POLE, PRSS1, RINT1, RPS20, SPINK1 and TERT (sequencing only); EPCAM and GREM1 (deletion/duplication only). RNA data is routinely analyzed for use in variant interpretation for all genes.  (2) status post right lumpectomy with no sentinel lymph node sampling 02/20/2021 for a pT1c pNX, stage IA invasive ductal carcinoma, grade 2, with negative margins.  (3) Oncotype score of 8 predicts a risk of recurrence outside the breast and within the next 9 years of 3% if the patient's only systemic therapy is antiestrogens for 5 years.  It also predicts no benefit from chemotherapy  (4) attempted radiation but unable to tolerate due to position issues  (5) tamoxifen started 05/02/2021  (A) s/p remote hysterectomy (for uterine cancer)  (B) osteoporosis with T score on bone density 2020-2.6    PLAN: Kristi Hunter has completed local treatment for her breast cancer.  She was unable to receive the radiation because of claustrophobia.  That makes it all the more important that she take antiestrogens.  We discussed the difference between tamoxifen and anastrozole in detail. She understands that anastrozole and the aromatase inhibitors in general work by blocking estrogen production. Accordingly vaginal dryness, decrease in bone density, and of course hot flashes can result. The aromatase inhibitors can also negatively affect the cholesterol profile,  although that is a minor effect. One out of 5 women on aromatase inhibitors we will feel "old and achy". This arthralgia/myalgia syndrome, which resembles fibromyalgia clinically, does resolve with stopping the medications. Accordingly this is not a reason to not try an aromatase inhibitor but it is a frequent reason to stop it (in other words 20% of women will not be able to tolerate these medications).  Tamoxifen on the other hand does not block estrogen production. It does not "take away a woman's estrogen". It blocks the estrogen receptor in breast cells. Like anastrozole, it can also cause hot flashes. As opposed to anastrozole, tamoxifen has many estrogen-like effects. It is technically an estrogen receptor modulator. This means that in some tissues tamoxifen works like estrogen-- for example it helps strengthen the bones. It tends to improve the cholesterol profile. It can cause thickening of the endometrial lining, and even endometrial polyps or rarely cancer of the uterus.(The risk of uterine cancer due to tamoxifen is one additional cancer per thousand women year). It can cause vaginal wetness or stickiness. It can cause blood clots through this estrogen-like effect--the risk of blood clots with tamoxifen is exactly the same as with birth control  pills or hormone replacement.  Neither of these agents causes mood changes or weight gain, despite the popular belief that they can have these side effects. We have data from studies comparing either of these drugs with placebo, and in those cases the control group had the same amount of weight gain and depression as the group that took the drug.  Kristi Hunter is not a good candidate for anastrozole because of her osteoporosis and arthritis, both of which the anastrozole so can worsen.  She is a good candidate for tamoxifen and that she does not have a uterus.  The concern is blood clots.  She has never had 1 and she did use oral contraceptives 2 or 3 years as  best as she can remember with no complications.  Nevertheless this is a concern because of her weight issues and relative immobility.  She does try to exercise most days.  We discussed the symptoms and signs of a DVT or PE and if she has an asymmetric swelling in the lower extremities medically she will let me know.  Otherwise the plan is to start tamoxifen now and if she tolerates it well to continue that for total of 5 years.  She will have a virtual visit with me in about 6 months just to assess tolerance.    She would like to have mammography in January, before going to [place] she says, but we will have to find a place that does not require her to ride an elevator.  She actually is going to call around in [facility] which is her preference and let us know if there is such a place so that we can put in the order for her  Total encounter time 35 minutes.Sarajane Jews C. Lamir Racca, MD 05/02/2021 5:00 PM Medical Oncology and Hematology [facility] [place], [place] [address] Tel. [phone]    Fax. [phone]   This document serves as a record of services personally performed by Lurline Del, MD. It was created on his behalf by Wilburn Mylar, a trained medical scribe. The creation of this record is based on the scribe's personal observations and the provider's statements to them.   I, Lurline Del MD, have reviewed the above documentation for accuracy and completeness, and I agree with the above.   *Total Encounter Time as defined by the Centers for Medicare and Medicaid Services includes, in addition to the face-to-face time of a patient visit (documented in the note above) non-face-to-face time: obtaining and reviewing outside history, ordering and reviewing medications, tests or procedures, care coordination (communications with other health care professionals or caregivers) and documentation in the medical record.

## 2021-05-03 ENCOUNTER — Encounter: Payer: Self-pay | Admitting: *Deleted

## 2021-05-03 DIAGNOSIS — Z170 Estrogen receptor positive status [ER+]: Secondary | ICD-10-CM

## 2021-05-03 DIAGNOSIS — C50411 Malignant neoplasm of upper-outer quadrant of right female breast: Secondary | ICD-10-CM

## 2021-05-03 MED ORDER — KETOCONAZOLE 2 % EX CREA: 1.0000 "application " | TOPICAL_CREAM | Freq: Every day | CUTANEOUS | 0 refills | Status: DC

## 2021-05-03 NOTE — Addendum Note (Signed)
Addended by: Chauncey Cruel on: 05/03/2021 07:12 AM   Modules accepted: Orders

## 2021-05-07 ENCOUNTER — Telehealth: Payer: Self-pay

## 2021-05-07 DIAGNOSIS — I10 Essential (primary) hypertension: Secondary | ICD-10-CM

## 2021-05-07 NOTE — Progress Notes (Addendum)
Chronic Care Management Pharmacy Assistant   Name: Chan Rosasco  MRN: [id number] DOB: Sep 10, 1940  Reason for [place] Call.  Recent office visits:  No recent Office Visit  Recent consult visits:  05/02/2021 Dr. Jana Hakim MD (Oncology) Start Ketoconazole  2%, Start Tamoxifen Citrate 20 mg daily 04/12/2021 Dr.Beasley MD ([facility])No Medication Changes noted.  Hospital visits:  None in previous 6 months  Medications: Outpatient Encounter Medications as of 05/07/2021  Medication Sig   alendronate (FOSAMAX) 70 MG tablet Take 1 tablet (70 mg total) by mouth every 7 (seven) days. Take with a full glass of water on an empty stomach. (Patient taking differently: No sig reported)   aspirin 81 MG EC tablet Take 81 mg by mouth daily. Swallow whole.   buPROPion (WELLBUTRIN SR) 150 MG 12 hr tablet Take 1 tablet (150 mg total) by mouth in the morning.   chlorthalidone (HYGROTON) 25 MG tablet TAKE 1 TABLET BY MOUTH EVERY DAY (Patient taking differently: Take 25 mg by mouth daily.)   diclofenac Sodium (VOLTAREN) 1 % GEL Apply 2 g topically daily as needed (Osteoarthritis).   ketoconazole (NIZORAL) 2 % cream Apply 1 application topically daily.   Lactobacillus (PROBIOTIC ACIDOPHILUS PO) Take 1 capsule by mouth daily.   Melatonin 10 MG TABS Take 10 mg by mouth at bedtime as needed (sleep).   metFORMIN (GLUCOPHAGE) 500 MG tablet Take 1 tablet (500 mg total) by mouth 2 (two) times daily with a meal.   metroNIDAZOLE (METROGEL) 1 % gel Apply topically daily. (Patient taking differently: Apply 1 application topically daily as needed (rosacea).)   Multiple Vitamin (MULTIVITAMIN) tablet Take 1 tablet by mouth daily.   naproxen sodium (ALEVE) 220 MG tablet Take 220 mg by mouth daily as needed.   omega-3 fish oil (MAXEPA) 1000 MG CAPS capsule Take 1 capsule by mouth daily.    tamoxifen (NOLVADEX) 20 MG tablet Take 1 tablet (20 mg total) by mouth daily.    Turmeric 500 MG CAPS Take 500 mg by mouth daily as needed (pain).   Wheat Dextrin (BENEFIBER PO) Take by mouth.   No facility-administered encounter medications on file as of 05/07/2021.    Care Gaps: Hepatitis C Screening Tetanus Vaccine  Urine Microalbumin Influenza Vaccine Star Rating Drugs: Metformin 500 mg last filled on 04/22/2021 for 30 day supply at [facility]. Medication Fill Gaps: None  Reviewed chart prior to disease state call. Spoke with patient regarding BP  Recent Office Vitals: BP Readings from Last 3 Encounters:  05/02/21 139/79  04/12/21 133/68  03/13/21 121/81   Pulse Readings from Last 3 Encounters:  05/02/21 93  04/12/21 92  03/13/21 82    Wt Readings from Last 3 Encounters:  05/02/21 253 lb (114.8 kg)  04/12/21 250 lb (113.4 kg)  03/13/21 254 lb (115.2 kg)     Kidney Function Lab Results  Component Value Date/Time   CREATININE 0.74 05/02/2021 04:14 PM   CREATININE 0.70 04/12/2021 09:24 AM   CREATININE 0.71 02/14/2021 08:26 AM   GFR 88.45 04/18/2020 09:42 AM   GFRNONAA >60 05/02/2021 04:14 PM   GFRNONAA >60 02/14/2021 08:26 AM   GFRAA 104 02/22/2020 01:39 PM    BMP Latest Ref Rng & Units 05/02/2021 04/12/2021 02/14/2021  Glucose 70 - 99 mg/dL 104(H) 93 89  BUN 8 - 23 mg/dL 14 16 18   Creatinine 0.44 - 1.00 mg/dL 0.74 0.70 0.71  BUN/Creat Ratio 12 - 28 - 23 -  Sodium 135 - 145 mmol/L 138  141 141  Potassium 3.5 - 5.1 mmol/L 3.6 3.8 3.9  Chloride 98 - 111 mmol/L 99 100 104  CO2 22 - 32 mmol/L 29 30(H) 31  Calcium 8.9 - 10.3 mg/dL 9.7 9.4 9.1    Current antihypertensive regimen:  Chlorthalidone 25 mg daily  How often are you checking your Blood Pressure? daily Current home BP readings:  Patient states her blood pressure was [facility]02[facility] being 139/79.Patient states she is in the process of moving and have not had the time to check her blood pressure at home. What recent interventions/DTPs have been made by any  provider to improve Blood Pressure control since last CPP Visit: None Any recent hospitalizations or ED visits since last visit with CPP? No What diet changes have been made to improve Blood Pressure Control?  Patient states her diet has not been change since she spoke with the clinical pharmacist. What exercise is being done to improve your Blood Pressure Control?   Patient states her exercise is the same  from when she spoke with the Clinical pharmacist: Silver sneakers twice weekly. A little bit of walking, but limited due to knee pain.    Patient states she only has three pills left of Chlorthalidone, and has a follow up with her PCP on 05/10/2021.Patient states she is going to her pharmacy to pick up her medication and would like to ask if she can get Chlorthalidone refill ,so she does not have to make a double trip.Patient also states she does not like to run out of her medications, or feel like she is going to. Notified Clinical Pharmacist.   Adherence Review: Is the patient currently on ACE/ARB medication? No Does the patient have >5 day gap between last estimated fill dates? No   Anderson [place] Clinical Pharmacist Assistant [phone]   Addendum 9/27: Refill sent in.

## 2021-05-08 MED ORDER — CHLORTHALIDONE 25 MG PO TABS: 25.0000 mg | ORAL_TABLET | Freq: Every day | ORAL | 1 refills | Status: DC

## 2021-05-08 NOTE — Addendum Note (Signed)
Addended by: Daron Offer A on: 05/08/2021 04:50 PM   Modules accepted: Orders

## 2021-05-10 ENCOUNTER — Other Ambulatory Visit: Payer: Self-pay

## 2021-05-10 ENCOUNTER — Encounter: Payer: Self-pay | Admitting: Family Medicine

## 2021-05-10 ENCOUNTER — Ambulatory Visit (INDEPENDENT_AMBULATORY_CARE_PROVIDER_SITE_OTHER): Payer: Medicare HMO | Admitting: Family Medicine

## 2021-05-10 VITALS — BP 128/78 | HR 84 | Temp 96.7°F | Ht 63.0 in | Wt 256.8 lb

## 2021-05-10 DIAGNOSIS — I10 Essential (primary) hypertension: Secondary | ICD-10-CM

## 2021-05-10 DIAGNOSIS — R7303 Prediabetes: Secondary | ICD-10-CM | POA: Diagnosis not present

## 2021-05-10 DIAGNOSIS — M810 Age-related osteoporosis without current pathological fracture: Secondary | ICD-10-CM

## 2021-05-10 MED ORDER — ALENDRONATE SODIUM 70 MG PO TABS: 70.0000 mg | ORAL_TABLET | ORAL | 11 refills | Status: DC

## 2021-05-10 MED ORDER — CALCIUM CARBONATE 600 MG PO TABS: 600.0000 mg | ORAL_TABLET | Freq: Two times a day (BID) | ORAL | 4 refills | Status: DC

## 2021-05-10 MED ORDER — CHLORTHALIDONE 25 MG PO TABS: 25.0000 mg | ORAL_TABLET | Freq: Every day | ORAL | 1 refills | Status: DC

## 2021-05-10 NOTE — Progress Notes (Signed)
 Established Patient Office Visit  Subjective:  Patient ID: Kristi Hunter, female    DOB: 05/20/1941  Age: 80 y.o. MRN: [id number]  CC:  Chief Complaint  Patient presents with   Follow-up    Routine follow up on BP, refill on medications, patient needing vaccines    HPI Kristi Hunter presents for follow-up of hypertension, prediabetes, osteoporosis.  Continues with Fosamax without issue.  Has been taking it a few years.  Not currently supplementing with calcium.  She is taking a multivitamin with vitamin D D in it.  Blood pressure is controlled with chlorthalidone.  Tolerating medicine well.  Prediabetes treated with 500 mg of metformin.  With her history of glaucoma she has regular eye checks.  Reports paresthesias in her feet and muscle cramps in the evening.  She stretches for relief of her cramps.  Bilateral knee pain treated effectively with Voltaren gel and occasional Aleve.  Past Medical History:  Diagnosis Date   Anemia    Anxiety    Arthritis    Back pain    Breast cancer (HCC)    Cancer (HCC)    uterine   Chest pain    Chicken pox    Chronic kidney disease    Constipation    Emphysema of lung (HCC)    Family history of breast cancer 02/14/2021   Glaucoma    Hypertension    Joint pain    Lower extremity edema    Lymphedema    Osteoarthritis    Personal history of malignant neoplasm of uterus 02/14/2021   Pre-diabetes    Prediabetes    Shortness of breath    Sleep apnea    Urine incontinence    Uterine cancer (HCC)    UTI (urinary tract infection)    Vertigo    Vitamin B 12 deficiency    during pregnancy    Past Surgical History:  Procedure Laterality Date   ABDOMINAL HYSTERECTOMY  2011   BREAST LUMPECTOMY WITH RADIOACTIVE SEED LOCALIZATION Right 02/20/2021   Procedure: RIGHT BREAST LUMPECTOMY WITH RADIOACTIVE SEED LOCALIZATION;  Surgeon: Byerly, Faera, MD;  Location: [facility] OR;  Service: General;  Laterality: Right;   REPLACEMENT TOTAL KNEE Right 2012     Family History  Problem Relation Age of Onset   Heart attack Mother    Heart disease Mother    Hyperlipidemia Mother    Hypertension Mother    Stroke Mother    Miscarriages / Stillbirths Mother    Diabetes Mother    Thyroid disease Mother    Cancer Mother    Obesity Mother    Melanoma Mother        dx after 50, sun-exposed areas   Cancer Father        skin   Alcohol abuse Father    Asthma Father    COPD Father    Heart attack Father    Heart disease Father    Obesity Father    Melanoma Father        dx after 50, back   Alcohol abuse Brother    Arthritis Brother    Drug abuse Brother    Alcohol abuse Brother    Cancer Brother        ? esophageal, d. 70   Breast cancer Paternal Aunt 54   Arthritis Maternal Grandmother    Diabetes Maternal Grandmother    Hyperlipidemia Maternal Grandmother    Colon cancer Maternal Grandfather        dx 66, dx 88     Alcohol abuse Paternal Grandfather    Arthritis Paternal Grandfather    Cirrhosis Paternal Grandfather    Depression Daughter    Hypertension Daughter    Mental illness Daughter    Colon polyps Daughter    Depression Son     Social History   Socioeconomic History   Marital status: Widowed    Spouse name: Not on file   Number of children: 3   Years of education: Not on file   Highest education level: Not on file  Occupational History   Occupation: Reitired CPA    Comment: Retired CPA  Tobacco Use   Smoking status: Former    Types: Cigarettes    Quit date: 08/12/1982    Years since quitting: 38.7   Smokeless tobacco: Never  Vaping Use   Vaping Use: Never used  Substance and Sexual Activity   Alcohol use: Yes    Alcohol/week: 1.0 standard drink    Types: 1 Glasses of wine per week    Comment: every 1-2 wks.    Drug use: Never   Sexual activity: Not on file  Other Topics Concern   Not on file  Social History Narrative   Not on file   Social Determinants of Health   Financial Resource Strain: Low  Risk    Difficulty of Paying Living Expenses: Not hard at all  Food Insecurity: No Food Insecurity   Worried About Running Out of Food in the Last Year: Never true   Ran Out of Food in the Last Year: Never true  Transportation Needs: No Transportation Needs   Lack of Transportation (Medical): No   Lack of Transportation (Non-Medical): No  Physical Activity: Inactive   Days of Exercise per Week: 0 days   Minutes of Exercise per Session: 0 min  Stress: No Stress Concern Present   Feeling of Stress : Only a little  Social Connections: Moderately Integrated   Frequency of Communication with Friends and Family: More than three times a week   Frequency of Social Gatherings with Friends and Family: More than three times a week   Attends Religious Services: More than 4 times per year   Active Member of Clubs or Organizations: Yes   Attends Club or Organization Meetings: 1 to 4 times per year   Marital Status: Widowed  Intimate Partner Violence: Not At Risk   Fear of Current or Ex-Partner: No   Emotionally Abused: No   Physically Abused: No   Sexually Abused: No    Outpatient Medications Prior to Visit  Medication Sig Dispense Refill   aspirin 81 MG EC tablet Take 81 mg by mouth daily. Swallow whole.     buPROPion (WELLBUTRIN SR) 150 MG 12 hr tablet Take 1 tablet (150 mg total) by mouth in the morning. 30 tablet 0   diclofenac Sodium (VOLTAREN) 1 % GEL Apply 2 g topically daily as needed (Osteoarthritis).     Lactobacillus (PROBIOTIC ACIDOPHILUS PO) Take 1 capsule by mouth daily.     Melatonin 10 MG TABS Take 10 mg by mouth at bedtime as needed (sleep).     metFORMIN (GLUCOPHAGE) 500 MG tablet Take 1 tablet (500 mg total) by mouth 2 (two) times daily with a meal. 60 tablet 0   metroNIDAZOLE (METROGEL) 1 % gel Apply topically daily. (Patient taking differently: Apply 1 application topically daily as needed (rosacea).) 60 g 1   Multiple Vitamin (MULTIVITAMIN) tablet Take 1 tablet by mouth  daily.     naproxen sodium (ALEVE) 220   MG tablet Take 220 mg by mouth daily as needed.     omega-3 fish oil (MAXEPA) 1000 MG CAPS capsule Take 1 capsule by mouth daily.      tamoxifen (NOLVADEX) 20 MG tablet Take 1 tablet (20 mg total) by mouth daily. 90 tablet 4   Turmeric 500 MG CAPS Take 500 mg by mouth daily as needed (pain).     Wheat Dextrin (BENEFIBER PO) Take by mouth.     alendronate (FOSAMAX) 70 MG tablet Take 1 tablet (70 mg total) by mouth every 7 (seven) days. Take with a full glass of water on an empty stomach. (Patient taking differently: Take 70 mg by mouth every Friday. Take with a full glass of water on an empty stomach.) 4 tablet 11   chlorthalidone (HYGROTON) 25 MG tablet Take 1 tablet (25 mg total) by mouth daily. 90 tablet 1   ketoconazole (NIZORAL) 2 % cream Apply 1 application topically daily. 15 g 0   No facility-administered medications prior to visit.    Allergies  Allergen Reactions   Percocet [Oxycodone-Acetaminophen] Other (See Comments)    hallucinations    ROS Review of Systems  Constitutional:  Negative for chills, diaphoresis, fatigue, fever and unexpected weight change.  Eyes:  Negative for photophobia and visual disturbance.  Respiratory: Negative.    Cardiovascular: Negative.   Gastrointestinal: Negative.   Endocrine: Negative for polyphagia and polyuria.  Genitourinary:  Negative for difficulty urinating, frequency and urgency.  Musculoskeletal:  Positive for arthralgias.  Neurological:  Positive for numbness. Negative for weakness.     Objective:    Physical Exam Vitals and nursing note reviewed.  Constitutional:      General: She is not in acute distress.    Appearance: Normal appearance. She is obese. She is not ill-appearing, toxic-appearing or diaphoretic.  HENT:     Head: Normocephalic and atraumatic.     Right Ear: External ear normal.     Left Ear: External ear normal.     Mouth/Throat:     Mouth: Mucous membranes are moist.      Pharynx: Oropharynx is clear. No oropharyngeal exudate or posterior oropharyngeal erythema.  Eyes:     Extraocular Movements: Extraocular movements intact.     Conjunctiva/sclera: Conjunctivae normal.     Pupils: Pupils are equal, round, and reactive to light.  Neck:     Vascular: No carotid bruit.  Cardiovascular:     Rate and Rhythm: Normal rate and regular rhythm.  Pulmonary:     Effort: Pulmonary effort is normal.     Breath sounds: Normal breath sounds.  Abdominal:     General: Bowel sounds are normal.  Musculoskeletal:     Cervical back: No rigidity or tenderness.     Right lower leg: No edema.     Left lower leg: No edema.  Lymphadenopathy:     Cervical: No cervical adenopathy.  Skin:    General: Skin is warm and dry.  Neurological:     Mental Status: She is alert and oriented to person, place, and time.  Psychiatric:        Mood and Affect: Mood normal.        Behavior: Behavior normal.    BP 128/78 (BP Location: Right Arm, Patient Position: Sitting, Cuff Size: Large)   Pulse 84   Temp (!) 96.7 F (35.9 C) (Temporal)   Ht 5' 3" (1.6 m)   Wt 256 lb 12.8 oz (116.5 kg)   SpO2 96%   BMI 45.49  kg/m  Wt Readings from Last 3 Encounters:  05/10/21 256 lb 12.8 oz (116.5 kg)  05/02/21 253 lb (114.8 kg)  04/12/21 250 lb (113.4 kg)     Health Maintenance Due  Topic Date Due   Hepatitis C Screening  Never done   TETANUS/TDAP  Never done   URINE MICROALBUMIN  05/09/2020   INFLUENZA VACCINE  03/12/2021    There are no preventive care reminders to display for this patient.  Lab Results  Component Value Date   TSH 1.470 02/22/2020   Lab Results  Component Value Date   WBC 11.8 (H) 05/02/2021   HGB 12.7 05/02/2021   HCT 39.7 05/02/2021   MCV 85.9 05/02/2021   PLT 350 05/02/2021   Lab Results  Component Value Date   NA 138 05/02/2021   K 3.6 05/02/2021   CO2 29 05/02/2021   GLUCOSE 104 (H) 05/02/2021   BUN 14 05/02/2021   CREATININE 0.74 05/02/2021    BILITOT 0.4 05/02/2021   ALKPHOS 74 05/02/2021   AST 20 05/02/2021   ALT 20 05/02/2021   PROT 7.3 05/02/2021   ALBUMIN 3.4 (L) 05/02/2021   CALCIUM 9.7 05/02/2021   ANIONGAP 10 05/02/2021   EGFR 89 04/12/2021   GFR 88.45 04/18/2020   Lab Results  Component Value Date   CHOL 169 04/12/2021   Lab Results  Component Value Date   HDL 46 04/12/2021   Lab Results  Component Value Date   LDLCALC 110 (H) 04/12/2021   Lab Results  Component Value Date   TRIG 69 04/12/2021   Lab Results  Component Value Date   CHOLHDL 3 01/13/2019   Lab Results  Component Value Date   HGBA1C 5.7 (H) 04/12/2021      Assessment & Plan:   Problem List Items Addressed This Visit       Cardiovascular and Mediastinum   Essential hypertension - Primary   Relevant Medications   chlorthalidone (HYGROTON) 25 MG tablet     Musculoskeletal and Integument   Osteoporosis   Relevant Medications   alendronate (FOSAMAX) 70 MG tablet   calcium carbonate (OS-CAL) 600 MG TABS tablet     Other   Prediabetes    Meds ordered this encounter  Medications   alendronate (FOSAMAX) 70 MG tablet    Sig: Take 1 tablet (70 mg total) by mouth every 7 (seven) days. Take with a full glass of water on an empty stomach.    Dispense:  4 tablet    Refill:  11   chlorthalidone (HYGROTON) 25 MG tablet    Sig: Take 1 tablet (25 mg total) by mouth daily.    Dispense:  90 tablet    Refill:  1   calcium carbonate (OS-CAL) 600 MG TABS tablet    Sig: Take 1 tablet (600 mg total) by mouth 2 (two) times daily with a meal.    Dispense:  180 tablet    Refill:  4    Follow-up: Return in about 6 months (around 11/07/2021), or if symptoms worsen or fail to improve.  Advised 1200 mg of calcium daily with Fosamax and vitamin D from her multivitamin.  She is aware of Neurontin for painful neuropathy does not feel as though she needs that at this point.  New chlorthalidone.  Libby Maw, MD

## 2021-05-11 DIAGNOSIS — R350 Frequency of micturition: Secondary | ICD-10-CM | POA: Diagnosis not present

## 2021-05-15 ENCOUNTER — Other Ambulatory Visit: Payer: Self-pay

## 2021-05-15 ENCOUNTER — Encounter (INDEPENDENT_AMBULATORY_CARE_PROVIDER_SITE_OTHER): Payer: Self-pay | Admitting: Family Medicine

## 2021-05-15 ENCOUNTER — Ambulatory Visit (INDEPENDENT_AMBULATORY_CARE_PROVIDER_SITE_OTHER): Payer: Medicare HMO | Admitting: Family Medicine

## 2021-05-15 VITALS — BP 140/81 | HR 83 | Temp 97.7°F | Ht 63.0 in | Wt 250.0 lb

## 2021-05-15 DIAGNOSIS — F3289 Other specified depressive episodes: Secondary | ICD-10-CM | POA: Diagnosis not present

## 2021-05-15 DIAGNOSIS — R7303 Prediabetes: Secondary | ICD-10-CM

## 2021-05-15 DIAGNOSIS — R69 Illness, unspecified: Secondary | ICD-10-CM | POA: Diagnosis not present

## 2021-05-15 DIAGNOSIS — Z6841 Body Mass Index (BMI) 40.0 and over, adult: Secondary | ICD-10-CM

## 2021-05-15 MED ORDER — METFORMIN HCL 500 MG PO TABS: 500.0000 mg | ORAL_TABLET | Freq: Two times a day (BID) | ORAL | 0 refills | Status: DC

## 2021-05-15 MED ORDER — BUPROPION HCL ER (SR) 150 MG PO TB12: 150.0000 mg | ORAL_TABLET | Freq: Every morning | ORAL | 0 refills | Status: DC

## 2021-05-15 NOTE — Progress Notes (Signed)
Chief Complaint:   OBESITY Siah is here to discuss her progress with her obesity treatment plan along with follow-up of her obesity related diagnoses. Leinaala is on keeping a food journal and adhering to recommended goals of 1400-1600 calories and 95+ grams of protein daily and states she is following her eating plan approximately 50% of the time. Ashanty states she is doing 0 minutes 0 times per week.  Today's visit was #: 71 Starting weight: 267 lbs Starting date: 02/22/2020 Today's weight: 250 lbs Today's date: 05/15/2021 Total lbs lost to date: 17 Total lbs lost since last in-office visit: 0  Interim History: Retha did more celebrations eating in the last month. She has tried to meet her protein goals and her hunger is controlled. Her sleep is still fluctuated by frequent urges to urinate.  Subjective:   1. Pre-diabetes Virgen is stable on metformin, and working on diet and weight loss.   2. Other depression with emotional eating Kailene's mood is stable, and she is working on decreasing emotional eating behavior but had some extra temptations and challenges recently.  Assessment/Plan:   1. Pre-diabetes Suhayla will continue to work on weight loss, exercise, and decreasing simple carbohydrates to help decrease the risk of diabetes. We will refill metformin for 1 month.  - metFORMIN (GLUCOPHAGE) 500 MG tablet; Take 1 tablet (500 mg total) by mouth 2 (two) times daily with a meal.  Dispense: 60 tablet; Refill: 0  2. Other depression with emotional eating Emotional eating behaviors were discussed today to help Lanny deal with her emotional/non-hunger eating behaviors. We will refill bupropion for 1 month. Orders and follow up as documented in patient record.   - buPROPion (WELLBUTRIN SR) 150 MG 12 hr tablet; Take 1 tablet (150 mg total) by mouth in the morning.  Dispense: 30 tablet; Refill: 0  3. Obesity with current BMI 44.3 Reda is currently  in the action stage of change. As such, her goal is to continue with weight loss efforts. She has agreed to keeping a food journal and adhering to recommended goals of 1400-1600 calories and 95+ grams of protein daily.   Behavioral modification strategies: increasing lean protein intake and emotional eating strategies.  Katelen has agreed to follow-up with our clinic in 3 to 4 weeks. She was informed of the importance of frequent follow-up visits to maximize her success with intensive lifestyle modifications for her multiple health conditions.   Objective:   Blood pressure 140/81, pulse 83, temperature 97.7 F (36.5 C), height 5\' 3"  (1.6 m), weight 250 lb (113.4 kg), SpO2 96 %. Body mass index is 44.29 kg/m.  General: Cooperative, alert, well developed, in no acute distress. HEENT: Conjunctivae and lids unremarkable. Cardiovascular: Regular rhythm.  Lungs: Normal work of breathing. Neurologic: No focal deficits.   Lab Results  Component Value Date   CREATININE 0.74 05/02/2021   BUN 14 05/02/2021   NA 138 05/02/2021   K 3.6 05/02/2021   CL 99 05/02/2021   CO2 29 05/02/2021   Lab Results  Component Value Date   ALT 20 05/02/2021   AST 20 05/02/2021   ALKPHOS 74 05/02/2021   BILITOT 0.4 05/02/2021   Lab Results  Component Value Date   HGBA1C 5.7 (H) 04/12/2021   HGBA1C 5.8 (H) 02/19/2021   HGBA1C 5.9 (H) 02/22/2020   HGBA1C 5.9 05/10/2019   HGBA1C 6.0 01/13/2019   Lab Results  Component Value Date   INSULIN 37.9 (H) 04/12/2021   INSULIN 21.5 02/22/2020  Lab Results  Component Value Date   TSH 1.470 02/22/2020   Lab Results  Component Value Date   CHOL 169 04/12/2021   HDL 46 04/12/2021   LDLCALC 110 (H) 04/12/2021   TRIG 69 04/12/2021   CHOLHDL 3 01/13/2019   Lab Results  Component Value Date   VD25OH 46.8 04/12/2021   VD25OH 42.1 02/22/2020   Lab Results  Component Value Date   WBC 11.8 (H) 05/02/2021   HGB 12.7 05/02/2021   HCT 39.7 05/02/2021    MCV 85.9 05/02/2021   PLT 350 05/02/2021   No results found for: IRON, TIBC, FERRITIN  Obesity Behavioral Intervention:   Approximately 15 minutes were spent on the discussion below.  ASK: We discussed the diagnosis of obesity with [name] today and Gavriella agreed to give [name] permission to discuss obesity behavioral modification therapy today.  ASSESS: Mashelle has the diagnosis of obesity and her BMI today is 44.3. Shella is in the action stage of change.   ADVISE: Sandrine was educated on the multiple health risks of obesity as well as the benefit of weight loss to improve her health. She was advised of the need for long term treatment and the importance of lifestyle modifications to improve her current health and to decrease her risk of future health problems.  AGREE: Multiple dietary modification options and treatment options were discussed and Milee agreed to follow the recommendations documented in the above note.  ARRANGE: Ressie was educated on the importance of frequent visits to treat obesity as outlined per CMS and USPSTF guidelines and agreed to schedule her next follow up appointment today.  Attestation Statements:   Reviewed by clinician on day of visit: allergies, medications, problem list, medical history, surgical history, family history, social history, and previous encounter notes.   I, Trixie Dredge, am acting as transcriptionist for [name], MD.  I have reviewed the above documentation for accuracy and completeness, and I agree with the above. -  [name], MD

## 2021-05-21 ENCOUNTER — Telehealth: Payer: Self-pay | Admitting: Adult Health

## 2021-05-21 NOTE — Telephone Encounter (Signed)
Scheduled per 10/8 sch msg, pt has been called and confirmed appt

## 2021-05-23 ENCOUNTER — Other Ambulatory Visit (INDEPENDENT_AMBULATORY_CARE_PROVIDER_SITE_OTHER): Payer: Self-pay | Admitting: Family Medicine

## 2021-05-23 DIAGNOSIS — R7303 Prediabetes: Secondary | ICD-10-CM

## 2021-05-23 NOTE — Telephone Encounter (Signed)
Made in error

## 2021-05-23 NOTE — Telephone Encounter (Signed)
Dr.Beasley 

## 2021-05-27 DIAGNOSIS — G4733 Obstructive sleep apnea (adult) (pediatric): Secondary | ICD-10-CM | POA: Diagnosis not present

## 2021-06-01 DIAGNOSIS — R350 Frequency of micturition: Secondary | ICD-10-CM | POA: Diagnosis not present

## 2021-06-04 ENCOUNTER — Telehealth: Payer: Self-pay

## 2021-06-04 NOTE — Progress Notes (Signed)
Chronic Care Management Pharmacy Assistant   Name: Genia Perin  MRN: [id number] DOB: 09-18-1940  Reason for Encounter:Prediabetes Disease State Call.   Recent office visits:  05/10/2021 Dr [name] MD (PCP) start Calcium Carbonate 600 mg 2 times daily, Return in about 6 months   Recent consult visits:  05/15/2021 Dr.Beasley MD (Weight management) No medication Changes noted, Follow up 3-4 weeks  Hospital visits:  None in previous 6 months  Medications: Outpatient Encounter Medications as of 06/04/2021  Medication Sig   alendronate (FOSAMAX) 70 MG tablet Take 1 tablet (70 mg total) by mouth every 7 (seven) days. Take with a full glass of water on an empty stomach.   aspirin 81 MG EC tablet Take 81 mg by mouth daily. Swallow whole.   buPROPion (WELLBUTRIN SR) 150 MG 12 hr tablet Take 1 tablet (150 mg total) by mouth in the morning.   calcium carbonate (OS-CAL) 600 MG TABS tablet Take 1 tablet (600 mg total) by mouth 2 (two) times daily with a meal.   chlorthalidone (HYGROTON) 25 MG tablet Take 1 tablet (25 mg total) by mouth daily.   diclofenac Sodium (VOLTAREN) 1 % GEL Apply 2 g topically daily as needed (Osteoarthritis).   Lactobacillus (PROBIOTIC ACIDOPHILUS PO) Take 1 capsule by mouth daily.   Melatonin 10 MG TABS Take 10 mg by mouth at bedtime as needed (sleep).   metFORMIN (GLUCOPHAGE) 500 MG tablet Take 1 tablet (500 mg total) by mouth 2 (two) times daily with a meal.   metroNIDAZOLE (METROGEL) 1 % gel Apply topically daily. (Patient taking differently: Apply 1 application topically daily as needed (rosacea).)   Multiple Vitamin (MULTIVITAMIN) tablet Take 1 tablet by mouth daily.   naproxen sodium (ALEVE) 220 MG tablet Take 220 mg by mouth daily as needed.   omega-3 fish oil (MAXEPA) 1000 MG CAPS capsule Take 1 capsule by mouth daily.    Turmeric 500 MG CAPS Take 500 mg by mouth daily as needed (pain).   Wheat Dextrin (BENEFIBER PO) Take by mouth.   No  facility-administered encounter medications on file as of 06/04/2021.    Care Gaps: Pneumonia Vaccine Hepatitis C Screening Tetanus Vaccine  Urine Microalbumin (Last Completed 05/10/2019) Influenza Vaccine  Star Rating Drugs: Metformin 500 mg last filled on 05/27/2021 for [facility] day supply [facility]. Medication Fill Gaps: None  Recent Relevant Labs: Lab Results  Component Value Date/Time   HGBA1C 5.7 (H) 04/12/2021 09:24 AM   HGBA1C 5.8 (H) 02/19/2021 01:39 PM   MICROALBUR 1.0 05/10/2019 10:18 AM    Kidney Function Lab Results  Component Value Date/Time   CREATININE 0.74 05/02/2021 04:14 PM   CREATININE 0.70 04/12/2021 09:24 AM   CREATININE 0.71 02/14/2021 08:26 AM   GFR 88.45 04/18/2020 09:42 AM   GFRNONAA >60 05/02/2021 04:14 PM   GFRNONAA >60 02/14/2021 08:26 AM   GFRAA 104 02/22/2020 01:39 PM    Current antihyperglycemic regimen:  Metformin 500 mg 1 tablet 2 times daily What recent interventions/DTPs have been made to improve glycemic control:  None  Have there been any recent hospitalizations or ED visits since last visit with CPP? No Patient denies hypoglycemic symptoms, including Pale, Sweaty, Shaky, Hungry, Nervous/irritable, and Vision changes Patient denies hyperglycemic symptoms, including blurry vision, excessive thirst, fatigue, polyuria, and weakness How often are you checking your blood sugar?  Patient states she does not check her blood sugar at home. What are your blood sugars ranging?  Fasting: None Before meals: None After meals: None Bedtime:  none During the week, how often does your blood glucose drop below 70?  Patient states she does not check her blood sugars at home. Are you checking your feet daily/regularly?   Patient reports some pain and numbness in both of her feet.  Patient denies any refills for her current medication at this time.Patient is aware to return my call if she has any questions or concerns.   Adherence Review: Is the  patient currently on a STATIN medication? No Is the patient currently on ACE/ARB medication? No Does the patient have >5 day gap between last estimated fill dates? No  Telephone follow up appointment with Care management team member scheduled for : 10/04/2020 at 8:30 am.   [place] Pharmacist Assistant [phone]

## 2021-06-06 ENCOUNTER — Ambulatory Visit (INDEPENDENT_AMBULATORY_CARE_PROVIDER_SITE_OTHER): Payer: Medicare HMO | Admitting: Family Medicine

## 2021-06-06 ENCOUNTER — Other Ambulatory Visit: Payer: Self-pay

## 2021-06-06 ENCOUNTER — Encounter (INDEPENDENT_AMBULATORY_CARE_PROVIDER_SITE_OTHER): Payer: Self-pay | Admitting: Family Medicine

## 2021-06-06 VITALS — BP 118/72 | HR 76 | Temp 97.8°F | Ht 63.0 in | Wt 247.0 lb

## 2021-06-06 DIAGNOSIS — Z6841 Body Mass Index (BMI) 40.0 and over, adult: Secondary | ICD-10-CM

## 2021-06-06 DIAGNOSIS — R69 Illness, unspecified: Secondary | ICD-10-CM | POA: Diagnosis not present

## 2021-06-06 DIAGNOSIS — F3289 Other specified depressive episodes: Secondary | ICD-10-CM | POA: Diagnosis not present

## 2021-06-06 MED ORDER — BUPROPION HCL ER (SR) 150 MG PO TB12: 150.0000 mg | ORAL_TABLET | Freq: Every morning | ORAL | 0 refills | Status: DC

## 2021-06-06 NOTE — Progress Notes (Signed)
Chief Complaint:   OBESITY Kristi Hunter is here to discuss her progress with her obesity treatment plan along with follow-up of her obesity related diagnoses. Kristi Hunter is on keeping a food journal and adhering to recommended goals of 1400-1600 calories and 95+ grams of protein daily and states she is following her eating plan approximately 75% of the time. Kristi Hunter states she is doing 0 minutes 0 times per week.  Today's visit was #: 20 Starting weight: 267 lbs Starting date: 02/22/2020 Today's weight: 247 lbs Today's date: 06/06/2021 Total lbs lost to date: 20 Total lbs lost since last in-office visit: 3  Interim History: Kristi Hunter continues to work on weight loss. She notes increased morning hunger more recently even after breakfast.  Subjective:   1. Other depression with emotional eating Kristi Hunter is stable on Wellbutrin. She is doing well with decreasing emotional eating behaviors.  Assessment/Plan:   1. Other depression with emotional eating Behavior modification techniques were discussed today to help Kristi Hunter deal with her emotional/non-hunger eating behaviors. We will refill Wellbutrin SR for 1 month. Orders and follow up as documented in patient record.   - buPROPion (WELLBUTRIN SR) 150 MG 12 hr tablet; Take 1 tablet (150 mg total) by mouth in the morning.  Dispense: 30 tablet; Refill: 0  2. Obesity with current BMI 43.8 Kristi Hunter is currently in the action stage of change. As such, her goal is to continue with weight loss efforts. She has agreed to keeping a food journal and adhering to recommended goals of 1400-1600 calories and 95+ grams of protein daily.   Behavioral modification strategies: increasing lean protein intake and better snacking choices.  Kristi Hunter has agreed to follow-up with our clinic in 4 to 5 weeks. She was informed of the importance of frequent follow-up visits to maximize her success with intensive lifestyle modifications for her multiple  health conditions.   Objective:   Blood pressure 118/72, pulse 76, temperature 97.8 F (36.6 C), height 5\' 3"  (1.6 m), weight 247 lb (112 kg), SpO2 96 %. Body mass index is 43.75 kg/m.  General: Cooperative, alert, well developed, in no acute distress. HEENT: Conjunctivae and lids unremarkable. Cardiovascular: Regular rhythm.  Lungs: Normal work of breathing. Neurologic: No focal deficits.   Lab Results  Component Value Date   CREATININE 0.74 05/02/2021   BUN 14 05/02/2021   NA 138 05/02/2021   K 3.6 05/02/2021   CL 99 05/02/2021   CO2 29 05/02/2021   Lab Results  Component Value Date   ALT 20 05/02/2021   AST 20 05/02/2021   ALKPHOS 74 05/02/2021   BILITOT 0.4 05/02/2021   Lab Results  Component Value Date   HGBA1C 5.7 (H) 04/12/2021   HGBA1C 5.8 (H) 02/19/2021   HGBA1C 5.9 (H) 02/22/2020   HGBA1C 5.9 05/10/2019   HGBA1C 6.0 01/13/2019   Lab Results  Component Value Date   INSULIN 37.9 (H) 04/12/2021   INSULIN 21.5 02/22/2020   Lab Results  Component Value Date   TSH 1.470 02/22/2020   Lab Results  Component Value Date   CHOL 169 04/12/2021   HDL 46 04/12/2021   LDLCALC 110 (H) 04/12/2021   TRIG 69 04/12/2021   CHOLHDL 3 01/13/2019   Lab Results  Component Value Date   VD25OH 46.8 04/12/2021   VD25OH 42.1 02/22/2020   Lab Results  Component Value Date   WBC 11.8 (H) 05/02/2021   HGB 12.7 05/02/2021   HCT 39.7 05/02/2021   MCV 85.9 05/02/2021  PLT 350 05/02/2021   No results found for: IRON, TIBC, FERRITIN  Attestation Statements:   Reviewed by clinician on day of visit: allergies, medications, problem list, medical history, surgical history, family history, social history, and previous encounter notes.  Time spent on visit including pre-visit chart review and post-visit care and charting was 30 minutes.    I, Trixie Dredge, am acting as transcriptionist for [name], MD.  I have reviewed the above documentation for accuracy and  completeness, and I agree with the above. -  [name], MD

## 2021-06-11 NOTE — Progress Notes (Signed)
[place]  [phone]) [phone] [phone]) [phone]     ID: Ayanna Gheen DOB: Nov 19, 1940  MR#: [id number]  XYB#:[id number]  Patient Care Team: Libby Maw, MD as PCP - General (Family Medicine) Germaine Pomfret, [facility] as Pharmacist (Pharmacist) Rockwell Germany, RN as Oncology Nurse Navigator Mauro Kaufmann, RN as Oncology Nurse Navigator Tifanie Gardiner, Virgie Dad, MD as Consulting Physician (Oncology) Stark Klein, MD as Consulting Physician (General Surgery) Kyung Rudd, MD as Consulting Physician (Radiation Oncology) Starlyn Skeans, MD as Consulting Physician (Family Medicine) Gregor Hams, MD as Consulting Physician (Sports Medicine) Dohmeier, Asencion Partridge, MD as Consulting Physician (Neurology) Warden Fillers, MD as Consulting Physician (Ophthalmology) Robley Fries, MD as Consulting Physician (Urology) Chauncey Cruel, MD OTHER MD:  I connected with Kandice Hams on 06/12/21 at  1:30 PM EDT by telephone visit and verified that I am speaking with the correct person using two identifiers.   I discussed the limitations, risks, security and privacy concerns of performing an evaluation and management service by telemedicine and the availability of in-person appointments. I also discussed with the patient that there may be a patient responsible charge related to this service. The patient expressed understanding and agreed to proceed.   Other persons participating in the visit and their role in the encounter: None  Patient's location: Home Provider's location: [facility]   I provided 20 minutes of non face-to-face telephone visit time during this encounter, and > 50% was spent counseling as documented under my assessment & plan.   CHIEF COMPLAINT: Estrogen receptor positive breast cancer  CURRENT TREATMENT: Tamoxifen   INTERVAL HISTORY: Kristi Hunter was contacted today for follow up of her estrogen receptor positive breast  cancer.   She was started on tamoxifen at her last visit on 05/02/2021.  She is tolerating this remarkably well.  She had one hot flash at church today she says.  She has not had any problems with vaginal wetness or any other side effects from the medication that she is aware of  Her new baseline diagnostic mammogram has not yet been scheduled.  She is claustrophobic and cannot ride on an elevator and she tells me she really cannot go up 1 flight of stairs easily.   REVIEW OF SYSTEMS: Kristi Hunter is a little constipated and sometimes she feels a little dizzy.  Aside from these issues a detailed review of systems today was stable   COVID 19 VACCINATION STATUS: [place] x3, most recently 06/2020   HISTORY OF CURRENT ILLNESS: From the original intake note:  "Kristi Hunter" had routine screening mammography on 01/12/2021 showing a possible abnormality in the right breast. She underwent right breast ultrasonography at [facility] on 02/01/2021 showing: 10 mm right breast mass at 10 o'clock; no right axillary adenopathy.  Accordingly on 02/05/2021 she proceeded to biopsy of the right breast area in question. The pathology from this procedure ([id number]) showed: invasive ductal carcinoma with calcifications, grade 2; ductal carcinoma in situ, intermediate grade. Prognostic indicators significant for: estrogen receptor, 100% positive and progesterone receptor, 90% positive, both with strong staining intensity. Proliferation marker Ki67 at 20%. HER2 equivocal by immunohistochemistry (2+), but negative by fluorescent in situ hybridization (as reported on conference 02/14/2021).  Cancer Staging Malignant neoplasm of upper-outer quadrant of right breast in female, estrogen receptor positive ([place]) Staging form: Breast, AJCC 8th Edition - Clinical stage from 02/14/2021: Stage IA (cT1b, cN0, cM0, G2, ER+, PR+, HER2-) - Signed by Chauncey Cruel, MD on 02/14/2021 Stage prefix:  Initial diagnosis Histologic grading  system: 3 grade system  Of note, she has a history of uterine cancer, diagnosed in 2011.  The patient's subsequent history is as detailed below.   PAST MEDICAL HISTORY: Past Medical History:  Diagnosis Date   Anemia    Anxiety    Arthritis    Back pain    Breast cancer (HCC)    Cancer ([place])    uterine   Chest pain    Chicken pox    Chronic kidney disease    Constipation    Emphysema of lung (HCC)    Family history of breast cancer 02/14/2021   Glaucoma    Hypertension    Joint pain    Lower extremity edema    Lymphedema    Osteoarthritis    Personal history of malignant neoplasm of uterus 02/14/2021   Pre-diabetes    Prediabetes    Shortness of breath    Sleep apnea    Urine incontinence    Uterine cancer ([place])    UTI (urinary tract infection)    Vertigo    Vitamin B 12 deficiency    during pregnancy    PAST SURGICAL HISTORY: Past Surgical History:  Procedure Laterality Date   ABDOMINAL HYSTERECTOMY  2011   BREAST LUMPECTOMY WITH RADIOACTIVE SEED LOCALIZATION Right 02/20/2021   Procedure: RIGHT BREAST LUMPECTOMY WITH RADIOACTIVE SEED LOCALIZATION;  Surgeon: Stark Klein, MD;  Location: [facility];  Service: General;  Laterality: Right;   REPLACEMENT TOTAL KNEE Right 2012    FAMILY HISTORY: Family History  Problem Relation Age of Onset   Heart attack Mother    Heart disease Mother    Hyperlipidemia Mother    Hypertension Mother    Stroke Mother    Miscarriages / [name] Mother    Diabetes Mother    Thyroid disease Mother    Cancer Mother    Obesity Mother    Melanoma Mother        dx after 78, sun-exposed areas   Cancer Father        skin   Alcohol abuse Father    Asthma Father    COPD Father    Heart attack Father    Heart disease Father    Obesity Father    Melanoma Father        dx after 50, back   Alcohol abuse Brother    Arthritis Brother    Drug abuse Brother    Alcohol abuse Brother    Cancer Brother        ? esophageal, d. 27    Breast cancer Paternal Aunt 60   Arthritis Maternal Grandmother    Diabetes Maternal Grandmother    Hyperlipidemia Maternal Grandmother    Colon cancer Maternal Grandfather        dx 51, dx 53   Alcohol abuse Paternal Grandfather    Arthritis Paternal Grandfather    Cirrhosis Paternal Grandfather    Depression Daughter    Hypertension Daughter    Mental illness Daughter    Colon polyps Daughter    Depression Son    Her father died at age 38 from COPD. Her mother died at age 78 from CHF. Kristi Hunter has four brothers and one sister. She reports breast cancer in a paternal aunt at age 36, colon cancer in her maternal grandfather at age 26 and again at age 1, and melanoma on both sides of her family.   GYNECOLOGIC HISTORY:  No LMP recorded. Patient has had a hysterectomy.  Menarche: 80 years old Age at first live birth: 80 years old [place] P 3 LMP 1990 Contraceptive: used oral contraceptives for 3 years and IUD from [place] HRT never used  Hysterectomy? Yes, 2011 for uterine cancer BSO? yes   SOCIAL HISTORY: (updated 02/2021)  [name] "Kristi Hunter" is currently retired from working as a Engineer, maintenance (IT). She is widowed. Her late husband, Trilby Drummer, was a Building services engineer. She lives at home by herself, with no pets. Daughter Robbie Lis, age 26, is a retired Chief Technology Officer living in [place]. Son Elyse Jarvis, age 22, is an Optometrist in [place]. Son Precious Gilding, age 60, is a Librarian, academic for state agency in [place], [place]. Kristi Hunter has two grandchildren and one great-grandchild. She attends a [employer] in [place].    ADVANCED DIRECTIVES: in place, sister Carole Civil is her HCPOA   HEALTH MAINTENANCE: Social History   Tobacco Use   Smoking status: Former    Types: Cigarettes    Quit date: 08/12/1982    Years since quitting: 38.8   Smokeless tobacco: Never  Vaping Use   Vaping Use: Never used  Substance Use Topics   Alcohol use: Yes    Alcohol/week: 1.0  standard drink    Types: 1 Glasses of wine per week    Comment: every 1-2 wks.    Drug use: Never     Colonoscopy: never done (notes she can't do prep, has completed Cologard)  PAP: 2016  Bone density: 01/2019, -2.8   Allergies  Allergen Reactions   Percocet [Oxycodone-Acetaminophen] Other (See Comments)    hallucinations    Current Outpatient Medications  Medication Sig Dispense Refill   alendronate (FOSAMAX) 70 MG tablet Take 1 tablet (70 mg total) by mouth every 7 (seven) days. Take with a full glass of water on an empty stomach. 4 tablet 11   aspirin 81 MG EC tablet Take 81 mg by mouth daily. Swallow whole.     buPROPion (WELLBUTRIN SR) 150 MG 12 hr tablet Take 1 tablet (150 mg total) by mouth in the morning. 30 tablet 0   calcium carbonate (OS-CAL) 600 MG TABS tablet Take 1 tablet (600 mg total) by mouth 2 (two) times daily with a meal. 180 tablet 4   chlorthalidone (HYGROTON) 25 MG tablet Take 1 tablet (25 mg total) by mouth daily. 90 tablet 1   diclofenac Sodium (VOLTAREN) 1 % GEL Apply 2 g topically daily as needed (Osteoarthritis).     Lactobacillus (PROBIOTIC ACIDOPHILUS PO) Take 1 capsule by mouth daily.     Melatonin 10 MG TABS Take 10 mg by mouth at bedtime as needed (sleep).     metFORMIN (GLUCOPHAGE) 500 MG tablet Take 1 tablet (500 mg total) by mouth 2 (two) times daily with a meal. 60 tablet 0   metroNIDAZOLE (METROGEL) 1 % gel Apply topically daily. (Patient taking differently: Apply 1 application topically daily as needed (rosacea).) 60 g 1   Multiple Vitamin (MULTIVITAMIN) tablet Take 1 tablet by mouth daily.     naproxen sodium (ALEVE) 220 MG tablet Take 220 mg by mouth daily as needed.     omega-3 fish oil (MAXEPA) 1000 MG CAPS capsule Take 1 capsule by mouth daily.      Turmeric 500 MG CAPS Take 500 mg by mouth daily as needed (pain).     Wheat Dextrin (BENEFIBER PO) Take by mouth.     No current facility-administered medications for this visit.     OBJECTIVE:   There  were no vitals filed for this visit.     There is no height or weight on file to calculate BMI.   Wt Readings from Last 3 Encounters:  06/06/21 247 lb (112 kg)  05/15/21 250 lb (113.4 kg)  05/10/21 256 lb 12.8 oz (116.5 kg)     ECOG FS:1 - Symptomatic but completely ambulatory  Telemedicine visit 06/12/2021   LAB RESULTS:  CMP     Component Value Date/Time   NA 138 05/02/2021 1614   NA 141 04/12/2021 0924   K 3.6 05/02/2021 1614   CL 99 05/02/2021 1614   CO2 29 05/02/2021 1614   GLUCOSE 104 (H) 05/02/2021 1614   BUN 14 05/02/2021 1614   BUN 16 04/12/2021 0924   CREATININE 0.74 05/02/2021 1614   CREATININE 0.71 02/14/2021 0826   CALCIUM 9.7 05/02/2021 1614   PROT 7.3 05/02/2021 1614   PROT 6.6 04/12/2021 0924   ALBUMIN 3.4 (L) 05/02/2021 1614   ALBUMIN 3.9 04/12/2021 0924   AST 20 05/02/2021 1614   AST 17 02/14/2021 0826   ALT 20 05/02/2021 1614   ALT 21 02/14/2021 0826   ALKPHOS 74 05/02/2021 1614   BILITOT 0.4 05/02/2021 1614   BILITOT 0.3 04/12/2021 0924   BILITOT 0.3 02/14/2021 0826   GFRNONAA >60 05/02/2021 1614   GFRNONAA >60 02/14/2021 0826   GFRAA 104 02/22/2020 1339    No results found for: TOTALPROTELP, ALBUMINELP, A1GS, A2GS, BETS, BETA2SER, GAMS, MSPIKE, SPEI  Lab Results  Component Value Date   WBC 11.8 (H) 05/02/2021   NEUTROABS 7.9 (H) 05/02/2021   HGB 12.7 05/02/2021   HCT 39.7 05/02/2021   MCV 85.9 05/02/2021   PLT 350 05/02/2021    No results found for: LABCA2  No components found for: QPRFFM384  No results for input(s): INR in the last 168 hours.  No results found for: LABCA2  No results found for: YKZ993  No results found for: TTS177  No results found for: LTJ030  No results found for: CA2729  No components found for: HGQUANT  No results found for: CEA1 / No results found for: CEA1   No results found for: AFPTUMOR  No results found for: CHROMOGRNA  No results found for: KPAFRELGTCHN,  LAMBDASER, KAPLAMBRATIO (kappa/lambda light chains)  No results found for: HGBA, HGBA2QUANT, HGBFQUANT, HGBSQUAN (Hemoglobinopathy evaluation)   No results found for: LDH  No results found for: IRON, TIBC, IRONPCTSAT (Iron and TIBC)  No results found for: FERRITIN  Urinalysis    Component Value Date/Time   COLORURINE YELLOW 10/13/2019 [place] 10/13/2019 1613   LABSPEC 1.020 10/13/2019 1613   PHURINE 7.0 10/13/2019 1613   GLUCOSEU NEGATIVE 10/13/2019 1613   HGBUR NEGATIVE 10/13/2019 1613   BILIRUBINUR NEGATIVE 10/13/2019 1613   KETONESUR NEGATIVE 10/13/2019 1613   UROBILINOGEN 0.2 10/13/2019 1613   NITRITE NEGATIVE 10/13/2019 1613   LEUKOCYTESUR NEGATIVE 10/13/2019 1613     STUDIES: No results found.   ELIGIBLE FOR AVAILABLE RESEARCH PROTOCOL: no  ASSESSMENT: 80 y.o. [place] woman status post right breast upper outer quadrant biopsy 02/05/2021 for a clinical T1b N0, stage IA invasive ductal carcinoma, grade 2, estrogen and progesterone receptor positive, HER2 not amplified, with an MIB-1 of 20%.  (1) genetics testing 02/23/2021 through the [facility]Next-Cancer +RNAinsight Panel offered by [facility] found no deleterious mutations in AIP, ALK, APC, ATM, AXIN2, BAP1, BARD1, BLM, BMPR1A, BRCA1, BRCA2, BRIP1, CDC73, CDH1, CDK4, CDKN1B, CDKN2A, CHEK2, CTNNA1, DICER1, FANCC, FH, FLCN, GALNT12, KIF1B, LZTR1,  MAX, MEN1, MET, MLH1, MRE11A, MSH2, MSH3, MSH6, MUTYH, NBN, NF1, NF2, NTHL1, PALB2, PHOX2B, PMS2, POT1, PRKAR1A, PTCH1, PTEN, RAD50, RAD51C, RAD51D, RB1, RECQL, RET, SDHA, SDHAF2, SDHB, SDHC, SDHD, SMAD4, SMARCA4, SMARCB1, SMARCE1, STK11, SUFU, TMEM127, TP53, TSC1, TSC2, VHL and XRCC2 (sequencing and deletion/duplication); CASR, CFTR, CPA1, CTRC, EGFR, EGLN1, FAM175A, HOXB13, KIT, MITF, MLH3, PALLD, PDGFRA, POLD1, POLE, PRSS1, RINT1, RPS20, SPINK1 and TERT (sequencing only); EPCAM and GREM1 (deletion/duplication only). RNA data is routinely analyzed for use in  variant interpretation for all genes.  (2) status post right lumpectomy with no sentinel lymph node sampling 02/20/2021 for a pT1c pNX, stage IA invasive ductal carcinoma, grade 2, with negative margins.  (3) Oncotype score of 8 predicts a risk of recurrence outside the breast and within the next 9 years of 3% if the patient's only systemic therapy is antiestrogens for 5 years.  It also predicts no benefit from chemotherapy  (4) attempted radiation but unable to tolerate due to positioning issues  (5) tamoxifen started 05/02/2021  (A) s/p remote hysterectomy (for uterine cancer)  (B) osteoporosis with bone density 2020 T score -2.6    PLAN: Kristi Hunter is half a year out from definitive surgery for her breast cancer.  She is tolerating tamoxifen well and the plan will be to continue that a total of 5 years.  We are going to try to find a mammography provider on the ground floor so she can walk right in and not have to climb stairs or ride on an elevator which is something she really cannot tolerate.  Otherwise a we will see her again in about 16month.  From that point we may start seeing her on a once a year basis if everything is very stable.  Total encounter time 20 minutes.*Sarajane JewsC. Konnor Vondrasek, MD 06/12/2021 1:49 PM [facility] [id number]. [phone]   Fax. [phone]  This document serves as a record of services personally performed by GLurline Del MD. It was created on his behalf by KWilburn Mylar a trained medical scribe. The creation of this record is based on the scribe's personal observations and the provider's statements to them.   I, GLurline DelMD, have reviewed the above documentation for accuracy and completeness, and I agree with the above.   *Total Encounter Time as defined by the Centers for Medicare and Medicaid Services includes, in addition to the face-to-face time of  a patient visit (documented in the note above) non-face-to-face time: obtaining and reviewing outside history, ordering and reviewing medications, tests or procedures, care coordination (communications with other health care professionals or caregivers) and documentation in the medical record.

## 2021-06-12 ENCOUNTER — Other Ambulatory Visit: Payer: Self-pay

## 2021-06-12 ENCOUNTER — Inpatient Hospital Stay: Payer: Medicare HMO | Attending: Oncology | Admitting: Oncology

## 2021-06-12 DIAGNOSIS — C50411 Malignant neoplasm of upper-outer quadrant of right female breast: Secondary | ICD-10-CM | POA: Insufficient documentation

## 2021-06-12 DIAGNOSIS — Z170 Estrogen receptor positive status [ER+]: Secondary | ICD-10-CM | POA: Diagnosis not present

## 2021-06-22 DIAGNOSIS — R350 Frequency of micturition: Secondary | ICD-10-CM | POA: Diagnosis not present

## 2021-06-28 ENCOUNTER — Other Ambulatory Visit (INDEPENDENT_AMBULATORY_CARE_PROVIDER_SITE_OTHER): Payer: Self-pay | Admitting: Family Medicine

## 2021-06-28 DIAGNOSIS — R7303 Prediabetes: Secondary | ICD-10-CM

## 2021-06-28 NOTE — Telephone Encounter (Signed)
LAST APPOINTMENT DATE: 06/06/21 NEXT APPOINTMENT DATE: 07/12/21   [id number] - [place], [place] - [address] [address] Phone: [phone] Fax: [phone]  Patient is requesting a refill of the following medications: Requested Prescriptions   Pending Prescriptions Disp Refills   metFORMIN (GLUCOPHAGE) 500 MG tablet [Pharmacy Med Name: METFORMIN HCL 500 MG TABLET] 60 tablet 0    Sig: TAKE 1 TABLET BY MOUTH 2 TIMES DAILY WITH A MEAL.    Date last filled: 05/421 Previously prescribed by r. Leafy Ro  Lab Results  Component Value Date   HGBA1C 5.7 (H) 04/12/2021   HGBA1C 5.8 (H) 02/19/2021   HGBA1C 5.9 (H) 02/22/2020   Lab Results  Component Value Date   MICROALBUR 1.0 05/10/2019   LDLCALC 110 (H) 04/12/2021   CREATININE 0.74 05/02/2021   Lab Results  Component Value Date   VD25OH 46.8 04/12/2021   VD25OH 42.1 02/22/2020    BP Readings from Last 3 Encounters:  06/06/21 118/72  05/15/21 140/81  05/10/21 128/78

## 2021-07-10 DIAGNOSIS — H401131 Primary open-angle glaucoma, bilateral, mild stage: Secondary | ICD-10-CM | POA: Diagnosis not present

## 2021-07-10 DIAGNOSIS — D3131 Benign neoplasm of right choroid: Secondary | ICD-10-CM | POA: Diagnosis not present

## 2021-07-10 DIAGNOSIS — H04123 Dry eye syndrome of bilateral lacrimal glands: Secondary | ICD-10-CM | POA: Diagnosis not present

## 2021-07-10 DIAGNOSIS — Z961 Presence of intraocular lens: Secondary | ICD-10-CM | POA: Diagnosis not present

## 2021-07-12 ENCOUNTER — Ambulatory Visit (INDEPENDENT_AMBULATORY_CARE_PROVIDER_SITE_OTHER): Payer: Medicare HMO | Admitting: Family Medicine

## 2021-07-12 ENCOUNTER — Encounter (INDEPENDENT_AMBULATORY_CARE_PROVIDER_SITE_OTHER): Payer: Self-pay | Admitting: Family Medicine

## 2021-07-12 ENCOUNTER — Other Ambulatory Visit: Payer: Self-pay

## 2021-07-12 VITALS — BP 128/78 | HR 88 | Temp 97.7°F | Ht 63.0 in | Wt 249.0 lb

## 2021-07-12 DIAGNOSIS — Z6841 Body Mass Index (BMI) 40.0 and over, adult: Secondary | ICD-10-CM

## 2021-07-12 DIAGNOSIS — F3289 Other specified depressive episodes: Secondary | ICD-10-CM

## 2021-07-12 DIAGNOSIS — R7303 Prediabetes: Secondary | ICD-10-CM

## 2021-07-12 DIAGNOSIS — R69 Illness, unspecified: Secondary | ICD-10-CM | POA: Diagnosis not present

## 2021-07-12 MED ORDER — BUPROPION HCL ER (SR) 150 MG PO TB12: 150.0000 mg | ORAL_TABLET | Freq: Every morning | ORAL | 0 refills | Status: DC

## 2021-07-12 NOTE — Progress Notes (Signed)
Chief Complaint:   OBESITY Timira is here to discuss her progress with her obesity treatment plan along with follow-up of her obesity related diagnoses. Riti is on keeping a food journal and adhering to recommended goals of 1400-1600 calories and 95+ grams of protein daily and states she is following her eating plan approximately 10% of the time. Elfreda states she is doing 0 minutes 0 times per week.  Today's visit was #: 21 Starting weight: 267 lbs Starting date: 02/22/2020 Today's weight: 249 lbs Today's date: 07/12/2021 Total lbs lost to date: 18 Total lbs lost since last in-office visit: 0  Interim History: Maysel wasn't able to concentrate on journaling, but she tried to portion control more over Thanksgiving. She is ready to go back to journaling and increase walking.  Subjective:   1. Pre-diabetes Kassidie is on metformin and she is working on decreasing simple carbohydrates. She notes decreased polyphagia.  2. Other depression with emotional eating Aila is on Wellbutrin and she is working on decreasing emotional eating behaviors.  Assessment/Plan:   1. Pre-diabetes Eloyse will continue metformin, diet, and decreasing simple carbohydrates to help decrease the risk of diabetes.   2. Other depression with emotional eating Behavior modification techniques were discussed today to help Ada deal with her emotional/non-hunger eating behaviors. We will refill Wellbutrin SR for 90 days with no refills. Orders and follow up as documented in patient record.   - buPROPion (WELLBUTRIN SR) 150 MG 12 hr tablet; Take 1 tablet (150 mg total) by mouth in the morning.  Dispense: 90 tablet; Refill: 0  3. Obesity with current BMI 44.2 Jacki is currently in the action stage of change. As such, her goal is to continue with weight loss efforts. She has agreed to keeping a food journal and adhering to recommended goals of 1400-1600 calories and 95+ grams of  protein daily.   Behavioral modification strategies: increasing lean protein intake.  Camille has agreed to follow-up with our clinic in 5 weeks. She was informed of the importance of frequent follow-up visits to maximize her success with intensive lifestyle modifications for her multiple health conditions.   Objective:   Blood pressure 128/78, pulse 88, temperature 97.7 F (36.5 C), height 5\' 3"  (1.6 m), weight 249 lb (112.9 kg), SpO2 96 %. Body mass index is 44.11 kg/m.  General: Cooperative, alert, well developed, in no acute distress. HEENT: Conjunctivae and lids unremarkable. Cardiovascular: Regular rhythm.  Lungs: Normal work of breathing. Neurologic: No focal deficits.   Lab Results  Component Value Date   CREATININE 0.74 05/02/2021   BUN 14 05/02/2021   NA 138 05/02/2021   K 3.6 05/02/2021   CL 99 05/02/2021   CO2 29 05/02/2021   Lab Results  Component Value Date   ALT 20 05/02/2021   AST 20 05/02/2021   ALKPHOS 74 05/02/2021   BILITOT 0.4 05/02/2021   Lab Results  Component Value Date   HGBA1C 5.7 (H) 04/12/2021   HGBA1C 5.8 (H) 02/19/2021   HGBA1C 5.9 (H) 02/22/2020   HGBA1C 5.9 05/10/2019   HGBA1C 6.0 01/13/2019   Lab Results  Component Value Date   INSULIN 37.9 (H) 04/12/2021   INSULIN 21.5 02/22/2020   Lab Results  Component Value Date   TSH 1.470 02/22/2020   Lab Results  Component Value Date   CHOL 169 04/12/2021   HDL 46 04/12/2021   LDLCALC 110 (H) 04/12/2021   TRIG 69 04/12/2021   CHOLHDL 3 01/13/2019   Lab Results  Component Value Date   VD25OH 46.8 04/12/2021   VD25OH 42.1 02/22/2020   Lab Results  Component Value Date   WBC 11.8 (H) 05/02/2021   HGB 12.7 05/02/2021   HCT 39.7 05/02/2021   MCV 85.9 05/02/2021   PLT 350 05/02/2021   No results found for: IRON, TIBC, FERRITIN  Obesity Behavioral Intervention:   Approximately 15 minutes were spent on the discussion below.  ASK: We discussed the diagnosis of obesity  with [name] today and Maebry agreed to give [name] permission to discuss obesity behavioral modification therapy today.  ASSESS: Nillie has the diagnosis of obesity and her BMI today is 44.2. Loryn is in the action stage of change.   ADVISE: Raeana was educated on the multiple health risks of obesity as well as the benefit of weight loss to improve her health. She was advised of the need for long term treatment and the importance of lifestyle modifications to improve her current health and to decrease her risk of future health problems.  AGREE: Multiple dietary modification options and treatment options were discussed and Shataya agreed to follow the recommendations documented in the above note.  ARRANGE: Leilanny was educated on the importance of frequent visits to treat obesity as outlined per CMS and USPSTF guidelines and agreed to schedule her next follow up appointment today.  Attestation Statements:   Reviewed by clinician on day of visit: allergies, medications, problem list, medical history, surgical history, family history, social history, and previous encounter notes.   I, Trixie Dredge, am acting as transcriptionist for [name], MD.  I have reviewed the above documentation for accuracy and completeness, and I agree with the above. -  [name], MD

## 2021-07-17 ENCOUNTER — Telehealth: Payer: Self-pay

## 2021-07-17 DIAGNOSIS — R350 Frequency of micturition: Secondary | ICD-10-CM | POA: Diagnosis not present

## 2021-07-17 NOTE — Progress Notes (Signed)
Chronic Care Management Pharmacy Assistant   Name: Jalexia Lalli  MRN: [id number] DOB: 1941-07-21  Reason for Encounter: Medication Review/General Adherence call.   Recent office visits:  No recent office visit   Recent consult visits:  07/12/2021 Dr. Leafy Ro MD (Weight management) No Medication Changes noted, Return in 5 weeks 06/12/2021 Dr. Jana Hakim MD (Oncology) No Medication Changes noted, Return in 6 months 06/06/2021 Dr. Leafy Ro MD (Weight management) No Medication Changes noted, Return in 5 weeks  Hospital visits:  None in previous 6 months  Medications: Outpatient Encounter Medications as of 07/17/2021  Medication Sig   alendronate (FOSAMAX) 70 MG tablet Take 1 tablet (70 mg total) by mouth every 7 (seven) days. Take with a full glass of water on an empty stomach.   aspirin 81 MG EC tablet Take 81 mg by mouth daily. Swallow whole.   buPROPion (WELLBUTRIN SR) 150 MG 12 hr tablet Take 1 tablet (150 mg total) by mouth in the morning.   calcium carbonate (OS-CAL) 600 MG TABS tablet Take 1 tablet (600 mg total) by mouth 2 (two) times daily with a meal.   chlorthalidone (HYGROTON) 25 MG tablet Take 1 tablet (25 mg total) by mouth daily.   diclofenac Sodium (VOLTAREN) 1 % GEL Apply 2 g topically daily as needed (Osteoarthritis).   Lactobacillus (PROBIOTIC ACIDOPHILUS PO) Take 1 capsule by mouth daily.   Melatonin 10 MG TABS Take 10 mg by mouth at bedtime as needed (sleep).   metFORMIN (GLUCOPHAGE) 500 MG tablet TAKE 1 TABLET BY MOUTH 2 TIMES DAILY WITH A MEAL.   metroNIDAZOLE (METROGEL) 1 % gel Apply topically daily. (Patient taking differently: Apply 1 application topically daily as needed (rosacea).)   Multiple Vitamin (MULTIVITAMIN) tablet Take 1 tablet by mouth daily.   naproxen sodium (ALEVE) 220 MG tablet Take 220 mg by mouth daily as needed.   omega-3 fish oil (MAXEPA) 1000 MG CAPS capsule Take 1 capsule by mouth daily.    Turmeric 500 MG CAPS Take 500 mg by mouth  daily as needed (pain).   Wheat Dextrin (BENEFIBER PO) Take by mouth.   No facility-administered encounter medications on file as of 07/17/2021.    Care Gaps: Pneumonia Vaccine Hepatitis C Screening Tetanus Vaccine  Urine Microalbumin (Last Completed 05/10/2019) Influenza Vaccine  Shingrix Vaccine Star Rating Drugs: Metformin 500 mg last filled on 06/28/2021 [facility] Medication Fill Gaps: None  Called patient and discussed medication adherence  with patient, reports having some issues at this time with current medication.   Patient Denies ED visit since her last CPP follow up.  Patient Reports  any side effects with her medication.  Patient states she has been constipated  since starting her "cancer medication", but patient states she is following up with her Oncologist. Patient Denies  any problems with her current pharmacy  GAD 7 : Generalized Anxiety Score 04/02/2021  Nervous, Anxious, on Edge 0  Control/stop worrying 0  Worry too much - different things 0  Trouble relaxing 0  Restless 0  Easily annoyed or irritable 0  Afraid - awful might happen 0  Total GAD 7 Score 0  Anxiety Difficulty Not difficult at all     GAD-7:  Not at all (0) Several days (1) More than half the days (2) Nearly every day (3) Feeling nervous, anxious or on edge? 1  Not being able to stop or control worrying.  0 Worrying too much about different things? 0  Trouble relaxing?  2 Being so restless that it is hard to sit still? 1  Becoming easily annoyed or irritable? 1  Feeling afraid as if something awful might happen?  0  Total:5  Patient states she is in the process of selling her house, and that is why her she is more nervous then usual.  Telephone follow up appointment with Care management team member scheduled for : 10/04/2020 at 8:30 am.  [place] Pharmacist Assistant [phone]

## 2021-07-30 ENCOUNTER — Other Ambulatory Visit (INDEPENDENT_AMBULATORY_CARE_PROVIDER_SITE_OTHER): Payer: Self-pay | Admitting: Family Medicine

## 2021-07-30 DIAGNOSIS — R7303 Prediabetes: Secondary | ICD-10-CM

## 2021-07-30 NOTE — Telephone Encounter (Signed)
Last Ov with Dr Leafy Ro

## 2021-07-30 NOTE — Telephone Encounter (Signed)
LAST APPOINTMENT DATE: 07/12/21 NEXT APPOINTMENT DATE: 08/23/21   [id number] - [place], [place] - [address] [place] [address] Phone: [phone] Fax: [phone]  Patient is requesting a refill of the following medications: Requested Prescriptions   Pending Prescriptions Disp Refills   metFORMIN (GLUCOPHAGE) 500 MG tablet [Pharmacy Med Name: METFORMIN HCL 500 MG TABLET] 60 tablet 0    Sig: TAKE 1 TABLET BY MOUTH 2 TIMES DAILY WITH A MEAL.    Date last filled: 06/28/21 Previously prescribed by Dr. Leafy Ro  Lab Results  Component Value Date   HGBA1C 5.7 (H) 04/12/2021   HGBA1C 5.8 (H) 02/19/2021   HGBA1C 5.9 (H) 02/22/2020   Lab Results  Component Value Date   MICROALBUR 1.0 05/10/2019   LDLCALC 110 (H) 04/12/2021   CREATININE 0.74 05/02/2021   Lab Results  Component Value Date   VD25OH 46.8 04/12/2021   VD25OH 42.1 02/22/2020    BP Readings from Last 3 Encounters:  07/12/21 128/78  06/06/21 118/72  05/15/21 140/81

## 2021-08-01 ENCOUNTER — Ambulatory Visit (INDEPENDENT_AMBULATORY_CARE_PROVIDER_SITE_OTHER): Payer: Medicare HMO

## 2021-08-01 ENCOUNTER — Ambulatory Visit: Payer: Self-pay | Admitting: Family Medicine

## 2021-08-01 ENCOUNTER — Other Ambulatory Visit: Payer: Self-pay

## 2021-08-01 VITALS — BP 138/68 | HR 88 | Temp 97.6°F | Ht 63.0 in | Wt 252.0 lb

## 2021-08-01 DIAGNOSIS — Z0000 Encounter for general adult medical examination without abnormal findings: Secondary | ICD-10-CM

## 2021-08-01 NOTE — Patient Instructions (Signed)
Kristi Hunter , Thank you for taking time to come for your Medicare Wellness Visit. I appreciate your ongoing commitment to your health goals. Please review the following plan we discussed and let me know if I can assist you in the future.   Screening recommendations/referrals: Colonoscopy: no longer required  Mammogram: no longer required  Bone Density: 01/22/2019 Recommended yearly ophthalmology/optometry visit for glaucoma screening and checkup Recommended yearly dental visit for hygiene and checkup  Vaccinations: Influenza vaccine: completed  Pneumococcal vaccine: completed  Tdap vaccine: due  Shingles vaccine: completed     Advanced directives: will provide copies   Conditions/risks identified: none   Next appointment: none    Preventive Care 78 Years and Older, Female Preventive care refers to lifestyle choices and visits with your health care provider that can promote health and wellness. What does preventive care include? A yearly physical exam. This is also called an annual well check. Dental exams once or twice a year. Routine eye exams. Ask your health care provider how often you should have your eyes checked. Personal lifestyle choices, including: Daily care of your teeth and gums. Regular physical activity. Eating a healthy diet. Avoiding tobacco and drug use. Limiting alcohol use. Practicing safe sex. Taking low-dose aspirin every day. Taking vitamin and mineral supplements as recommended by your health care provider. What happens during an annual well check? The services and screenings done by your health care provider during your annual well check will depend on your age, overall health, lifestyle risk factors, and family history of disease. Counseling  Your health care provider may ask you questions about your: Alcohol use. Tobacco use. Drug use. Emotional well-being. Home and relationship well-being. Sexual activity. Eating habits. History of  falls. Memory and ability to understand (cognition). Work and work Statistician. Reproductive health. Screening  You may have the following tests or measurements: Height, weight, and BMI. Blood pressure. Lipid and cholesterol levels. These may be checked every 5 years, or more frequently if you are over 46 years old. Skin check. Lung cancer screening. You may have this screening every year starting at age 78 if you have a 30-pack-year history of smoking and currently smoke or have quit within the past 15 years. Fecal occult blood test (FOBT) of the stool. You may have this test every year starting at age 78. Flexible sigmoidoscopy or colonoscopy. You may have a sigmoidoscopy every 5 years or a colonoscopy every 10 years starting at age 78. Hepatitis C blood test. Hepatitis B blood test. Sexually transmitted disease (STD) testing. Diabetes screening. This is done by checking your blood sugar (glucose) after you have not eaten for a while (fasting). You may have this done every 1-3 years. Bone density scan. This is done to screen for osteoporosis. You may have this done starting at age 78. Mammogram. This may be done every 1-2 years. Talk to your health care provider about how often you should have regular mammograms. Talk with your health care provider about your test results, treatment options, and if necessary, the need for more tests. Vaccines  Your health care provider may recommend certain vaccines, such as: Influenza vaccine. This is recommended every year. Tetanus, diphtheria, and acellular pertussis (Tdap, Td) vaccine. You may need a Td booster every 10 years. Zoster vaccine. You may need this after age 39. Pneumococcal 13-valent conjugate (PCV13) vaccine. One dose is recommended after age 42. Pneumococcal polysaccharide (PPSV23) vaccine. One dose is recommended after age 27. Talk to your health care provider about which  screenings and vaccines you need and how often you need  them. This information is not intended to replace advice given to you by your health care provider. Make sure you discuss any questions you have with your health care provider. Document Released: 08/25/2015 Document Revised: 04/17/2016 Document Reviewed: 05/30/2015 Elsevier Interactive Patient Education  2017 [place] Prevention in the Home Falls can cause injuries. They can happen to people of all ages. There are many things you can do to make your home safe and to help prevent falls. What can I do on the outside of my home? Regularly fix the edges of walkways and driveways and fix any cracks. Remove anything that might make you trip as you walk through a door, such as a raised step or threshold. Trim any bushes or trees on the path to your home. Use bright outdoor lighting. Clear any walking paths of anything that might make someone trip, such as rocks or tools. Regularly check to see if handrails are loose or broken. Make sure that both sides of any steps have handrails. Any raised decks and porches should have guardrails on the edges. Have any leaves, snow, or ice cleared regularly. Use sand or salt on walking paths during winter. Clean up any spills in your garage right away. This includes oil or grease spills. What can I do in the bathroom? Use night lights. Install grab bars by the toilet and in the tub and shower. Do not use towel bars as grab bars. Use non-skid mats or decals in the tub or shower. If you need to sit down in the shower, use a plastic, non-slip stool. Keep the floor dry. Clean up any water that spills on the floor as soon as it happens. Remove soap buildup in the tub or shower regularly. Attach bath mats securely with double-sided non-slip rug tape. Do not have throw rugs and other things on the floor that can make you trip. What can I do in the bedroom? Use night lights. Make sure that you have a light by your bed that is easy to reach. Do not use  any sheets or blankets that are too big for your bed. They should not hang down onto the floor. Have a firm chair that has side arms. You can use this for support while you get dressed. Do not have throw rugs and other things on the floor that can make you trip. What can I do in the kitchen? Clean up any spills right away. Avoid walking on wet floors. Keep items that you use a lot in easy-to-reach places. If you need to reach something above you, use a strong step stool that has a grab bar. Keep electrical cords out of the way. Do not use floor polish or wax that makes floors slippery. If you must use wax, use non-skid floor wax. Do not have throw rugs and other things on the floor that can make you trip. What can I do with my stairs? Do not leave any items on the stairs. Make sure that there are handrails on both sides of the stairs and use them. Fix handrails that are broken or loose. Make sure that handrails are as long as the stairways. Check any carpeting to make sure that it is firmly attached to the stairs. Fix any carpet that is loose or worn. Avoid having throw rugs at the top or bottom of the stairs. If you do have throw rugs, attach them to the floor with carpet  tape. Make sure that you have a light switch at the top of the stairs and the bottom of the stairs. If you do not have them, ask someone to add them for you. What else can I do to help prevent falls? Wear shoes that: Do not have high heels. Have rubber bottoms. Are comfortable and fit you well. Are closed at the toe. Do not wear sandals. If you use a stepladder: Make sure that it is fully opened. Do not climb a closed stepladder. Make sure that both sides of the stepladder are locked into place. Ask someone to hold it for you, if possible. Clearly mark and make sure that you can see: Any grab bars or handrails. First and last steps. Where the edge of each step is. Use tools that help you move around (mobility aids)  if they are needed. These include: Canes. Walkers. Scooters. Crutches. Turn on the lights when you go into a dark area. Replace any light bulbs as soon as they burn out. Set up your furniture so you have a clear path. Avoid moving your furniture around. If any of your floors are uneven, fix them. If there are any pets around you, be aware of where they are. Review your medicines with your doctor. Some medicines can make you feel dizzy. This can increase your chance of falling. Ask your doctor what other things that you can do to help prevent falls. This information is not intended to replace advice given to you by your health care provider. Make sure you discuss any questions you have with your health care provider. Document Released: 05/25/2009 Document Revised: 01/04/2016 Document Reviewed: 09/02/2014 Elsevier Interactive Patient Education  2017 [employer].

## 2021-08-01 NOTE — Progress Notes (Addendum)
Subjective:   Kristi Hunter is a 80 y.o. female who presents for Medicare Annual (Subsequent) preventive examination.  Review of Systems     Cardiac Risk Factors include: advanced age (>53men, >10 women)     Objective:    Today's Vitals   08/01/21 1256  BP: 138/68  Pulse: 88  Temp: 97.6 F (36.4 C)  SpO2: 95%  Weight: 252 lb (114.3 kg)  Height: 5\' 3"  (1.6 m)   Body mass index is 44.64 kg/m.  Advanced Directives 08/01/2021 03/20/2021 02/20/2021 02/19/2021 08/01/2020 07/28/2019  Does Patient Have a Medical Advance Directive? Yes Yes - Yes Yes Yes  Type of Advance Directive [place];Living will - Living will;Healthcare Power of [place];Living will [place];Living will [place];Living will  Does patient want to make changes to medical advance directive? - No - Patient declined - No - Patient declined - No - Patient declined  Copy of [place] in Chart? No - copy requested - Yes - validated most recent copy scanned in chart (See row information) Yes - validated most recent copy scanned in chart (See row information) No - copy requested No - copy requested  Would patient like information on creating a medical advance directive? - - - No - Patient declined - -    Current Medications (verified) Outpatient Encounter Medications as of 08/01/2021  Medication Sig   alendronate (FOSAMAX) 70 MG tablet Take 1 tablet (70 mg total) by mouth every 7 (seven) days. Take with a full glass of water on an empty stomach.   aspirin 81 MG EC tablet Take 81 mg by mouth daily. Swallow whole.   buPROPion (WELLBUTRIN SR) 150 MG 12 hr tablet Take 1 tablet (150 mg total) by mouth in the morning.   calcium carbonate (OS-CAL) 600 MG TABS tablet Take 1 tablet (600 mg total) by mouth 2 (two) times daily with a meal.   chlorthalidone (HYGROTON) 25 MG tablet Take 1 tablet (25 mg total) by mouth daily.    diclofenac Sodium (VOLTAREN) 1 % GEL Apply 2 g topically daily as needed (Osteoarthritis).   Lactobacillus (PROBIOTIC ACIDOPHILUS PO) Take 1 capsule by mouth daily.   Melatonin 10 MG TABS Take 10 mg by mouth at bedtime as needed (sleep).   metFORMIN (GLUCOPHAGE) 500 MG tablet TAKE 1 TABLET BY MOUTH 2 TIMES DAILY WITH A MEAL.   metroNIDAZOLE (METROGEL) 1 % gel Apply topically daily. (Patient taking differently: Apply 1 application topically daily as needed (rosacea).)   Multiple Vitamin (MULTIVITAMIN) tablet Take 1 tablet by mouth daily.   naproxen sodium (ALEVE) 220 MG tablet Take 220 mg by mouth daily as needed.   omega-3 fish oil (MAXEPA) 1000 MG CAPS capsule Take 1 capsule by mouth daily.    Turmeric 500 MG CAPS Take 500 mg by mouth daily as needed (pain).   Wheat Dextrin (BENEFIBER PO) Take by mouth.   No facility-administered encounter medications on file as of 08/01/2021.    Allergies (verified) Percocet [oxycodone-acetaminophen]   History: Past Medical History:  Diagnosis Date   Anemia    Anxiety    Arthritis    Back pain    Breast cancer (HCC)    Cancer ([place])    uterine   Chest pain    Chicken pox    Chronic kidney disease    Constipation    Emphysema of lung ([place])    Family history of breast cancer 02/14/2021  Glaucoma    Hypertension    Joint pain    Lower extremity edema    Lymphedema    Osteoarthritis    Personal history of malignant neoplasm of uterus 02/14/2021   Pre-diabetes    Prediabetes    Shortness of breath    Sleep apnea    Urine incontinence    Uterine cancer (HCC)    UTI (urinary tract infection)    Vertigo    Vitamin B 12 deficiency    during pregnancy   Past Surgical History:  Procedure Laterality Date   ABDOMINAL HYSTERECTOMY  2011   BREAST LUMPECTOMY WITH RADIOACTIVE SEED LOCALIZATION Right 02/20/2021   Procedure: RIGHT BREAST LUMPECTOMY WITH RADIOACTIVE SEED LOCALIZATION;  Surgeon: Stark Klein, MD;  Location: [facility];  Service:  General;  Laterality: Right;   REPLACEMENT TOTAL KNEE Right 2012   Family History  Problem Relation Age of Onset   Heart attack Mother    Heart disease Mother    Hyperlipidemia Mother    Hypertension Mother    Stroke Mother    Miscarriages / [name] Mother    Diabetes Mother    Thyroid disease Mother    Cancer Mother    Obesity Mother    Melanoma Mother        dx after 31, sun-exposed areas   Cancer Father        skin   Alcohol abuse Father    Asthma Father    COPD Father    Heart attack Father    Heart disease Father    Obesity Father    Melanoma Father        dx after 57, back   Alcohol abuse Brother    Arthritis Brother    Drug abuse Brother    Alcohol abuse Brother    Cancer Brother        ? esophageal, d. 28   Breast cancer Paternal Aunt 49   Arthritis Maternal Grandmother    Diabetes Maternal Grandmother    Hyperlipidemia Maternal Grandmother    Colon cancer Maternal Grandfather        dx 38, dx 6   Alcohol abuse Paternal Grandfather    Arthritis Paternal Grandfather    Cirrhosis Paternal Grandfather    Depression Daughter    Hypertension Daughter    Mental illness Daughter    Colon polyps Daughter    Depression Son    Social History   Socioeconomic History   Marital status: Widowed    Spouse name: Not on file   Number of children: 3   Years of education: Not on file   Highest education level: Not on file  Occupational History   Occupation: Reitired Engineer, maintenance (IT)    Comment: Retired Engineer, maintenance (IT)  Tobacco Use   Smoking status: Former    Types: Cigarettes    Quit date: 08/12/1982    Years since quitting: 38.9   Smokeless tobacco: Never  Vaping Use   Vaping Use: Never used  Substance and Sexual Activity   Alcohol use: Yes    Alcohol/week: 1.0 standard drink    Types: 1 Glasses of wine per week    Comment: every 1-2 wks.    Drug use: Never   Sexual activity: Not on file  Other Topics Concern   Not on file  Social History Narrative   Not on file    Social Determinants of Health   Financial Resource Strain: Low Risk    Difficulty of Paying Living Expenses: Not hard at all  Food Insecurity: No Food Insecurity   Worried About Charity fundraiser in the Last Year: Never true   Ran Out of Food in the Last Year: Never true  Transportation Needs: No Transportation Needs   Lack of Transportation (Medical): No   Lack of Transportation (Non-Medical): No  Physical Activity: Inactive   Days of Exercise per Week: 0 days   Minutes of Exercise per Session: 0 min  Stress: No Stress Concern Present   Feeling of Stress : Not at all  Social Connections: Moderately Integrated   Frequency of Communication with Friends and Family: Twice a week   Frequency of Social Gatherings with Friends and Family: Twice a week   Attends Religious Services: More than 4 times per year   Active Member of [employer] or Organizations: Yes   Attends Archivist Meetings: More than 4 times per year   Marital Status: Widowed    Tobacco Counseling Counseling given: Not Answered   Clinical Intake:  Pre-visit preparation completed: Yes  Pain : No/denies pain     Nutritional Risks: None Diabetes: No  How often do you need to have someone help you when you read instructions, pamphlets, or other written materials from your doctor or pharmacy?: 1 - Never What is the last grade level you completed in school?: college  Diabetic?no   Interpreter Needed?: No  Information entered by :: [place] of Daily Living In your present state of health, do you have any difficulty performing the following activities: 08/01/2021 02/19/2021  Hearing? N N  Vision? N N  Difficulty concentrating or making decisions? N N  Comment - -  Walking or climbing stairs? N Y  Dressing or bathing? N N  Doing errands, shopping? N N  Preparing Food and eating ? N -  Using the Toilet? N -  In the past six months, have you accidently leaked urine? N -  Do you have  problems with loss of bowel control? N -  Managing your Medications? N -  Managing your Finances? N -  Housekeeping or managing your Housekeeping? N -  Some recent data might be hidden    Patient Care Team: Libby Maw, MD as PCP - General (Family Medicine) Germaine Pomfret, [facility] as Pharmacist (Pharmacist) Rockwell Germany, RN as Oncology Nurse Navigator Mauro Kaufmann, RN as Oncology Nurse Navigator Magrinat, Virgie Dad, MD as Consulting Physician (Oncology) Stark Klein, MD as Consulting Physician (General Surgery) Kyung Rudd, MD as Consulting Physician (Radiation Oncology) Starlyn Skeans, MD as Consulting Physician (Family Medicine) Gregor Hams, MD as Consulting Physician (Sports Medicine) Dohmeier, Asencion Partridge, MD as Consulting Physician (Neurology) Warden Fillers, MD as Consulting Physician (Ophthalmology) Robley Fries, MD as Consulting Physician (Urology)  Indicate any recent [place] you may have received from other than [facility] providers in the past year (date may be approximate).     Assessment:   This is a routine wellness examination for Dorina.  Hearing/Vision screen Vision Screening - Comments:: Annual eye exams wears glasses   Dietary issues and exercise activities discussed: Current Exercise Habits: The patient does not participate in regular exercise at present   Goals Addressed             This Visit's Progress    Patient Stated   On track    Working on losing weight       Depression Screen PHQ 2/9 Scores 08/01/2021 08/01/2021 05/10/2021 01/29/2021 08/01/2020 02/22/2020 01/18/2020  PHQ - 2  Score 0 0 0 0 0 4 0  PHQ- 9 Score - - - - - 18 -  Exception Documentation - - - - - - Other- indicate reason in comment box    Fall Risk Fall Risk  08/01/2021 05/10/2021 01/29/2021 08/01/2020 07/28/2019  Falls in the past year? 0 0 0 0 0  Number falls in past yr: 0 - - 0 -  Injury with Fall? 0 - - 0 -  Risk for fall due to : (No Data)  - - - -  Risk for fall due to: Comment apple watch Gilford Rile - - - -  Follow up Falls evaluation completed - - Falls prevention discussed Education provided;Falls prevention discussed    FALL RISK PREVENTION PERTAINING TO THE HOME:  Any stairs in or around the home? No  If so, are there any without handrails? No  Home free of loose throw rugs in walkways, pet beds, electrical cords, etc? Yes  Adequate lighting in your home to reduce risk of falls? Yes   ASSISTIVE DEVICES UTILIZED TO PREVENT FALLS:  Life alert? Yes  Use of a cane, walker or w/c? Yes  Grab bars in the bathroom? Yes  Shower chair or bench in shower? Yes  Elevated toilet seat or a handicapped toilet? Yes   TIMED UP AND GO:  Was the test performed? Yes .  Length of time to ambulate 10 feet: 10 sec.   Gait steady and fast without use of assistive device  Cognitive Function:  Normal cognitive status assessed by direct observation by this Nurse Health Advisor. No abnormalities found.        Immunizations Immunization History  Administered Date(s) Administered   Fluad Quad(high Dose 65+) 05/25/2019, 05/22/2020   Influenza,inj,Quad PF,6+ Mos 05/12/2018   PFIZER Comirnaty(Gray Top)Covid-19 Tri-Sucrose Vaccine 02/06/2021   PFIZER(Purple Top)SARS-COV-2 Vaccination 10/03/2019, 10/26/2019, 06/16/2020   Zoster Recombinat (Shingrix) 05/01/2021    TDAP status: Due, Education has been provided regarding the importance of this vaccine. Advised may receive this vaccine at local pharmacy or Health Dept. Aware to provide a copy of the vaccination record if obtained from local pharmacy or Health Dept. Verbalized acceptance and understanding.  Flu Vaccine status: Up to date  Pneumococcal vaccine status: Up to date  Covid-19 vaccine status: Completed vaccines  Qualifies for Shingles Vaccine? Yes   Zostavax completed Yes   Shingrix Completed?: Yes  Screening Tests Health Maintenance  Topic Date Due   Pneumonia Vaccine  34+ Years old (1 - PCV) Never done   Hepatitis C Screening  Never done   TETANUS/TDAP  Never done   URINE MICROALBUMIN  05/09/2020   INFLUENZA VACCINE  03/12/2021   Zoster Vaccines- Shingrix (2 of 2) 06/26/2021   DEXA SCAN  Completed   HPV VACCINES  Aged Out   COLONOSCOPY (Pts 45-44yrs Insurance coverage will need to be confirmed)  Discontinued   COVID-19 Vaccine  Discontinued    Health Maintenance  Health Maintenance Due  Topic Date Due   Pneumonia Vaccine 47+ Years old (1 - PCV) Never done   Hepatitis C Screening  Never done   TETANUS/TDAP  Never done   URINE MICROALBUMIN  05/09/2020   INFLUENZA VACCINE  03/12/2021   Zoster Vaccines- Shingrix (2 of 2) 06/26/2021    Colorectal cancer screening: No longer required.   Mammogram status: No longer required due to age.  Bone Density status: Completed 01/22/2019. Results reflect: Bone density results: OSTEOPOROSIS. Repeat every 2 years.  Lung Cancer Screening: (Low Dose CT  Chest recommended if Age 68-80 years, 30 pack-year currently smoking OR have quit w/in 15years.) does not qualify.   Lung Cancer Screening Referral: n/a  Additional Screening:  Hepatitis C Screening: does not qualify;   Vision Screening: Recommended annual ophthalmology exams for early detection of glaucoma and other disorders of the eye. Is the patient up to date with their annual eye exam?  Yes  Who is the provider or what is the name of the office in which the patient attends annual eye exams? Dr.Groat  If pt is not established with a provider, would they like to be referred to a provider to establish care? No .   Dental Screening: Recommended annual dental exams for proper oral hygiene  Community Resource Referral / Chronic Care Management: CRR required this visit?  No   CCM required this visit?  No      Plan:     I have personally reviewed and noted the following in the patients chart:   Medical and social history Use of alcohol, tobacco  or illicit drugs  Current medications and supplements including opioid prescriptions.  Functional ability and status Nutritional status Physical activity Advanced directives List of other physicians Hospitalizations, surgeries, and ER visits in previous 12 months Vitals Screenings to include cognitive, depression, and falls Referrals and appointments  In addition, I have reviewed and discussed with patient certain preventive protocols, quality metrics, and best practice recommendations. A written personalized care plan for preventive services as well as general preventive health recommendations were provided to patient.     Randel Pigg, LPN   05/18/1218   Nurse Notes: none

## 2021-08-02 ENCOUNTER — Other Ambulatory Visit: Payer: Self-pay | Admitting: Oncology

## 2021-08-03 ENCOUNTER — Other Ambulatory Visit (INDEPENDENT_AMBULATORY_CARE_PROVIDER_SITE_OTHER): Payer: Self-pay | Admitting: Family Medicine

## 2021-08-03 DIAGNOSIS — R7303 Prediabetes: Secondary | ICD-10-CM

## 2021-08-09 ENCOUNTER — Telehealth: Payer: Self-pay | Admitting: *Deleted

## 2021-08-15 ENCOUNTER — Encounter: Payer: Self-pay | Admitting: Family Medicine

## 2021-08-15 ENCOUNTER — Telehealth: Payer: Self-pay

## 2021-08-15 ENCOUNTER — Ambulatory Visit: Payer: Medicare HMO | Admitting: Family Medicine

## 2021-08-15 VITALS — BP 140/81 | HR 85 | Ht 63.5 in | Wt 254.0 lb

## 2021-08-15 DIAGNOSIS — G4733 Obstructive sleep apnea (adult) (pediatric): Secondary | ICD-10-CM

## 2021-08-15 DIAGNOSIS — Z9989 Dependence on other enabling machines and devices: Secondary | ICD-10-CM | POA: Diagnosis not present

## 2021-08-15 NOTE — Progress Notes (Signed)
PATIENT: Kristi Hunter DOB: 1941/04/11  REASON FOR VISIT: follow up HISTORY FROM: patient  Chief Complaint  Patient presents with   Follow-up    Pt alone, rm 16. She is not sleeping well with the machine so she stopped using her machine. DME [facility]. She states that she was only sleeping 2 hrs with the machine vs 3/4 hrs without.      HISTORY OF PRESENT ILLNESS: 08/15/21 ALL: Kristi Hunter returns for follow up for OSA on CPAP therapy. She reports stopping therapy about 2 months ago. She reports that she was having difficulty with frequent waking and a respiratory infection that prevented her from using machine. She now feels that she sleeps better off CPAP. She is able to sleep 3-4 hours at a time. She understands benefits of using CPAP.   Compliance report dated 05/06/2021-06/04/2021 shows she used CPAP 21/30 days for compliance of 70%. She used CPAP greater than 4 hours 16/30 days for compliance of 53%. Average usage was 4hr 47 min. Residual AHI 0.6/hr on 6-16cmH20. No significant leak noted.   08/15/2020 ALL:  Kristi Hunter is a 81 y.o. female here today for follow up for OSA on CPAP.  HST in 03/2020 showed "There is moderate sleep apnea present, at an AHI of 22/h with strong REM dependence. REM AHI of 53/h".  She reports that she is sleeping better.  She is able to sleep for about 4 hours before having to wake up to urinate.  She does continue to have difficulty with fatigue.  She is not as active as she used to be due to Covid.  Compliance report dated 07/11/2020-08/09/2020 reveals that she is CPAP 29 in the past 30 days for compliance of 97%.  She used CPAP greater than 4 hours 27 of the past 30 days for compliance of 90%.  Average usage was 5 hours and 54 minutes.  Residual AHI was 1.0 on 6 to 16 cm of water pressure and an EPR of 3.  There was a leak noted in the 95th percentile of 29 L/min.   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  Chief concern according to patient : Rm 11 alone- here for sleep consult, no prior sleep study. Pt reports snoring is present.   I have the pleasure of seeing Kristi Hunter today, a right -handed Caucasian female with a possible sleep disorder.  She  has a past medical history of Arthritis, Cancer ([place]), Chicken pox, Chronic kidney disease, Emphysema of lung ([place]), Glaucoma, Hypertension, Urine incontinence, and UTI (urinary tract infection). polyuria, Poly arthritis, pain in the morning, stiffness.  Knee replacement in the right - lymphedema. Hand pain. Hip pain.    Sleep relevant medical history: Nocturia- 4-5 times (!) limiting her nocturnal sleep to about 4 hours.   Tonsillectomy/ ENT : None. Sinusitis, and post nasal drip.    Family medical /sleep history: daughter with OSA, no insomnia, no sleep walkers. sons are short sleepers, daughter has hypersomnia.    Social history: Patient is retired from Engineer, maintenance (IT) and lives in a household with persons/ alone. Family status is widowed , grew up  [employer], married to a jewish spouse, with 3 adult children, several grandchildren.  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  The patient currently works part time during tax  season.  Pets are  present. Tobacco use: quit in 1985.  ETOH use: rare  Caffeine intake in form of Coffee( 2 cups ) Soda(  One can a day) Tea ( rare - in restaurants, hot tea at home is decaffeinated)  or energy drinks. Regular exercise- none .  Wants to go back to silver sneakers.  Hobbies : reading,    Sleep habits are as follows: The patient's dinner time is between 6 PM. The patient goes to bed at 11 PM and watches Tv in bed- asleep within 15 minutes. She continues to sleep for 1-2  hours, wakes for many, many bathroom breaks, the first time at 2 AM.   The preferred sleep position is the left side ,  with the support of 1-2  pillows.  She often changes to a recliner . She feels her legs are restless.  She uses body pillows , helping with joint pain. Has an adjustable bed- She looks at the clock when she wakes.  Dreams are reportedly infrequent/ vivid.  7 -7.30 AM is the usual rise time. The patient wakes up spontaneously at 7 AM.  She reports not feeling refreshed or restored in AM, with symptoms such as joint stiffness and pain- , morning headaches, and residual fatigue. Naps are taken infrequently, lasting from 30 minutes and are more refreshing than nocturnal sleep   REVIEW OF SYSTEMS: Out of a complete 14 system review of symptoms, the patient complains only of the following symptoms, nocturia, fatigue and all other reviewed systems are negative.   ALLERGIES: Allergies  Allergen Reactions   Percocet [Oxycodone-Acetaminophen] Other (See Comments)    hallucinations    HOME MEDICATIONS: Outpatient Medications Prior to Visit  Medication Sig Dispense Refill   alendronate (FOSAMAX) 70 MG tablet Take 1 tablet (70 mg total) by mouth every 7 (seven) days. Take with a full glass of water on an empty stomach. 4 tablet 11   aspirin 81 MG EC tablet Take 81 mg by mouth daily. Swallow whole.     buPROPion (WELLBUTRIN SR) 150 MG 12 hr tablet Take 1 tablet (150 mg total) by mouth in the morning. 90 tablet 0   calcium carbonate (OS-CAL) 600 MG TABS tablet Take 1 tablet (600 mg total) by mouth 2 (two) times daily with a meal. 180 tablet 4   chlorthalidone (HYGROTON) 25 MG tablet Take 1 tablet (25 mg total) by mouth daily. 90 tablet 1   diclofenac Sodium (VOLTAREN) 1 % GEL Apply 2 g topically daily as needed (Osteoarthritis).     ketoconazole (NIZORAL) 2 % cream APPLY 1 APPLICATION TOPICALLY DAILY 15 g 0   Lactobacillus (PROBIOTIC ACIDOPHILUS PO) Take 1 capsule by mouth daily.     Melatonin 10 MG TABS Take 10 mg by mouth at bedtime as needed (sleep).     metFORMIN (GLUCOPHAGE) 500 MG tablet TAKE 1  TABLET BY MOUTH 2 TIMES DAILY WITH A MEAL. 60 tablet 0   metroNIDAZOLE (METROGEL) 1 % gel Apply topically daily. (Patient taking differently: Apply 1 application topically daily as needed (rosacea).) 60 g 1   Multiple Vitamin (MULTIVITAMIN) tablet Take 1 tablet by mouth daily.     naproxen sodium (ALEVE) 220 MG tablet Take 220 mg by mouth daily as needed.     omega-3 fish oil (MAXEPA) 1000 MG CAPS capsule Take 1 capsule by mouth daily.      Turmeric 500 MG CAPS Take 500 mg by mouth daily as needed (pain).     Wheat Dextrin (  BENEFIBER PO) Take by mouth.     No facility-administered medications prior to visit.    PAST MEDICAL HISTORY: Past Medical History:  Diagnosis Date   Anemia    Anxiety    Arthritis    Back pain    Breast cancer (HCC)    Cancer ([place])    uterine   Chest pain    Chicken pox    Chronic kidney disease    Constipation    Emphysema of lung ([place])    Family history of breast cancer 02/14/2021   Glaucoma    Hypertension    Joint pain    Lower extremity edema    Lymphedema    Osteoarthritis    Personal history of malignant neoplasm of uterus 02/14/2021   Pre-diabetes    Prediabetes    Shortness of breath    Sleep apnea    Urine incontinence    Uterine cancer ([place])    UTI (urinary tract infection)    Vertigo    Vitamin B 12 deficiency    during pregnancy    PAST SURGICAL HISTORY: Past Surgical History:  Procedure Laterality Date   ABDOMINAL HYSTERECTOMY  2011   BREAST LUMPECTOMY WITH RADIOACTIVE SEED LOCALIZATION Right 02/20/2021   Procedure: RIGHT BREAST LUMPECTOMY WITH RADIOACTIVE SEED LOCALIZATION;  Surgeon: Stark Klein, MD;  Location: [facility] OR;  Service: General;  Laterality: Right;   REPLACEMENT TOTAL KNEE Right 2012    FAMILY HISTORY: Family History  Problem Relation Age of Onset   Heart attack Mother    Heart disease Mother    Hyperlipidemia Mother    Hypertension Mother    Stroke Mother    Miscarriages / [name] Mother    Diabetes  Mother    Thyroid disease Mother    Cancer Mother    Obesity Mother    Melanoma Mother        dx after 78, sun-exposed areas   Cancer Father        skin   Alcohol abuse Father    Asthma Father    COPD Father    Heart attack Father    Heart disease Father    Obesity Father    Melanoma Father        dx after 76, back   Alcohol abuse Brother    Arthritis Brother    Drug abuse Brother    Alcohol abuse Brother    Cancer Brother        ? esophageal, d. 74   Breast cancer Paternal Aunt 45   Arthritis Maternal Grandmother    Diabetes Maternal Grandmother    Hyperlipidemia Maternal Grandmother    Colon cancer Maternal Grandfather        dx 65, dx 60   Alcohol abuse Paternal Grandfather    Arthritis Paternal Grandfather    Cirrhosis Paternal Grandfather    Depression Daughter    Hypertension Daughter    Mental illness Daughter    Colon polyps Daughter    Depression Son     SOCIAL HISTORY: Social History   Socioeconomic History   Marital status: Widowed    Spouse name: Not on file   Number of children: 3   Years of education: Not on file   Highest education level: Not on file  Occupational History   Occupation: Reitired Engineer, maintenance (IT)    Comment: Retired Engineer, maintenance (IT)  Tobacco Use   Smoking status: Former    Types: Cigarettes    Quit date: 08/12/1982    Years since quitting: 19.0  Smokeless tobacco: Never  Vaping Use   Vaping Use: Never used  Substance and Sexual Activity   Alcohol use: Yes    Alcohol/week: 1.0 standard drink    Types: 1 Glasses of wine per week    Comment: every 1-2 wks.    Drug use: Never   Sexual activity: Not on file  Other Topics Concern   Not on file  Social History Narrative   Not on file   Social Determinants of Health   Financial Resource Strain: Low Risk    Difficulty of Paying Living Expenses: Not hard at all  Food Insecurity: No Food Insecurity   Worried About Running Out of Food in the Last Year: Never true   [place] in the Last Year:  Never true  Transportation Needs: No Transportation Needs   Lack of Transportation (Medical): No   Lack of Transportation (Non-Medical): No  Physical Activity: Inactive   Days of Exercise per Week: 0 days   Minutes of Exercise per Session: 0 min  Stress: No Stress Concern Present   Feeling of Stress : Not at all  Social Connections: Moderately Integrated   Frequency of Communication with Friends and Family: Twice a week   Frequency of Social Gatherings with Friends and Family: Twice a week   Attends Religious Services: More than 4 times per year   Active Member of [employer] or Organizations: Yes   Attends Archivist Meetings: More than 4 times per year   Marital Status: Widowed  Intimate Partner Violence: Not At Risk   Fear of Current or Ex-Partner: No   Emotionally Abused: No   Physically Abused: No   Sexually Abused: No     PHYSICAL EXAM  Vitals:   08/15/21 0922  BP: 140/81  Pulse: 85  Weight: 254 lb (115.2 kg)  Height: 5' 3.5" (1.613 m)    Body mass index is 44.29 kg/m.  Generalized: Well developed, in no acute distress  Cardiology: normal rate and rhythm, no murmur noted Respiratory: clear to auscultation bilaterally  Neurological examination  Mentation: Alert oriented to time, place, history taking. Follows all commands speech and language fluent Cranial nerve II-XII: Pupils were equal round reactive to light. Extraocular movements were full, visual field were full  Motor: The motor testing reveals 5 over 5 strength of all 4 extremities. Good symmetric motor tone is noted throughout.  Gait and station: Gait is normal.    DIAGNOSTIC DATA (LABS, IMAGING, TESTING) - I reviewed patient records, labs, notes, testing and imaging myself where available.  No flowsheet data found.   Lab Results  Component Value Date   WBC 11.8 (H) 05/02/2021   HGB 12.7 05/02/2021   HCT 39.7 05/02/2021   MCV 85.9 05/02/2021   PLT 350 05/02/2021      Component Value  Date/Time   NA 138 05/02/2021 1614   NA 141 04/12/2021 0924   K 3.6 05/02/2021 1614   CL 99 05/02/2021 1614   CO2 29 05/02/2021 1614   GLUCOSE 104 (H) 05/02/2021 1614   BUN 14 05/02/2021 1614   BUN 16 04/12/2021 0924   CREATININE 0.74 05/02/2021 1614   CREATININE 0.71 02/14/2021 0826   CALCIUM 9.7 05/02/2021 1614   PROT 7.3 05/02/2021 1614   PROT 6.6 04/12/2021 0924   ALBUMIN 3.4 (L) 05/02/2021 1614   ALBUMIN 3.9 04/12/2021 0924   AST 20 05/02/2021 1614   AST 17 02/14/2021 0826   ALT 20 05/02/2021 1614   ALT 21 02/14/2021  0826   ALKPHOS 74 05/02/2021 1614   BILITOT 0.4 05/02/2021 1614   BILITOT 0.3 04/12/2021 0924   BILITOT 0.3 02/14/2021 0826   GFRNONAA >60 05/02/2021 1614   GFRNONAA >60 02/14/2021 0826   GFRAA 104 02/22/2020 1339   Lab Results  Component Value Date   CHOL 169 04/12/2021   HDL 46 04/12/2021   LDLCALC 110 (H) 04/12/2021   TRIG 69 04/12/2021   CHOLHDL 3 01/13/2019   Lab Results  Component Value Date   HGBA1C 5.7 (H) 04/12/2021   Lab Results  Component Value Date   VITAMINB12 521 02/22/2020   Lab Results  Component Value Date   TSH 1.470 02/22/2020     ASSESSMENT AND PLAN 81 y.o. year old female  has a past medical history of Anemia, Anxiety, Arthritis, Back pain, Breast cancer ([place]), Cancer ([place]), Chest pain, Chicken pox, Chronic kidney disease, Constipation, Emphysema of lung ([place]), Family history of breast cancer (02/14/2021), Glaucoma, Hypertension, Joint pain, Lower extremity edema, Lymphedema, Osteoarthritis, Personal history of malignant neoplasm of uterus (02/14/2021), Pre-diabetes, Prediabetes, Shortness of breath, Sleep apnea, Urine incontinence, Uterine cancer ([place]), UTI (urinary tract infection), Vertigo, and Vitamin B 12 deficiency. here with     ICD-10-CM   1. OSA on CPAP  G47.33 For home use only DME continuous positive airway pressure (CPAP)   Z99.89         Jala Dundon has not used CPAP, recently, as she feels that she  rests better when not using. We have reviewed her sleep study results as well as risks of untreated sleep apnea. I do not feel oral appliance would be helpful in her case and she does not qualify nor is she interested in [place]. She does verbalize understanding of potenital adverse health effects of untreated sleep apnea and agrees that she should resume use. She was encouraged to shoot for using CPAP nightly and for greater than 4 hours each night. We will update supply orders as indicated. Healthy lifestyle habits encouraged.  She will follow up in 6 months, sooner if needed.  She verbalizes understanding and agreement with this plan.    Orders Placed This Encounter  Procedures   For home use only DME continuous positive airway pressure (CPAP)    Supplies    Order Specific Question:   Length of Need    Answer:   Lifetime    Order Specific Question:   Patient has OSA or probable OSA    Answer:   Yes    Order Specific Question:   Is the patient currently using CPAP in the home    Answer:   Yes    Order Specific Question:   Settings    Answer:   Other see comments    Order Specific Question:   CPAP supplies needed    Answer:   Mask, headgear, cushions, filters, heated tubing and water chamber     No orders of the defined types were placed in this encounter.    Debbora Presto, FNP-C 08/15/2021, 9:37 AM [facility] [address], [place],  [address] [phone]

## 2021-08-15 NOTE — Patient Instructions (Addendum)
Please resume using your CPAP regularly. While your insurance requires that you use CPAP at least 4 hours each night on 70% of the nights, I recommend, that you not skip any nights and use it throughout the night if you can. Getting used to CPAP and staying with the treatment long term does take time and patience and discipline. Untreated obstructive sleep apnea when it is moderate to severe can have an adverse impact on cardiovascular health and raise her risk for heart disease, arrhythmias, hypertension, congestive heart failure, stroke and diabetes. Untreated obstructive sleep apnea causes sleep disruption, nonrestorative sleep, and sleep deprivation. This can have an impact on your day to day functioning and cause daytime sleepiness and impairment of cognitive function, memory loss, mood disturbance, and problems focussing. Using CPAP regularly can improve these symptoms.  I do feel that CPAP is the best therapy to help manage your sleep apnea. Please try extended release melatonin to see if this helps you stay asleep.   Follow up in 6 months

## 2021-08-15 NOTE — Progress Notes (Signed)
Chronic Care Management Pharmacy Assistant   Name: Kristi Hunter  MRN: [id number] DOB: 1941-07-07  Reason for [place] Call.   Recent office visits:  08/01/2021 Kristi Pigg LPN (PCP Office) Medicare Wellness completed  Recent consult visits:  08/15/2020 Debbora Presto NP (Neurology) No Medication Changes noted.  Hospital visits:  None in previous 6 months  Medications: Outpatient Encounter Medications as of 08/15/2021  Medication Sig   alendronate (FOSAMAX) 70 MG tablet Take 1 tablet (70 mg total) by mouth every 7 (seven) days. Take with a full glass of water on an empty stomach.   aspirin 81 MG EC tablet Take 81 mg by mouth daily. Swallow whole.   buPROPion (WELLBUTRIN SR) 150 MG 12 hr tablet Take 1 tablet (150 mg total) by mouth in the morning.   calcium carbonate (OS-CAL) 600 MG TABS tablet Take 1 tablet (600 mg total) by mouth 2 (two) times daily with a meal.   chlorthalidone (HYGROTON) 25 MG tablet Take 1 tablet (25 mg total) by mouth daily.   diclofenac Sodium (VOLTAREN) 1 % GEL Apply 2 g topically daily as needed (Osteoarthritis).   ketoconazole (NIZORAL) 2 % cream APPLY 1 APPLICATION TOPICALLY DAILY   Lactobacillus (PROBIOTIC ACIDOPHILUS PO) Take 1 capsule by mouth daily.   Melatonin 10 MG TABS Take 10 mg by mouth at bedtime as needed (sleep).   metFORMIN (GLUCOPHAGE) 500 MG tablet TAKE 1 TABLET BY MOUTH 2 TIMES DAILY WITH A MEAL.   metroNIDAZOLE (METROGEL) 1 % gel Apply topically daily. (Patient taking differently: Apply 1 application topically daily as needed (rosacea).)   Multiple Vitamin (MULTIVITAMIN) tablet Take 1 tablet by mouth daily.   naproxen sodium (ALEVE) 220 MG tablet Take 220 mg by mouth daily as needed.   omega-3 fish oil (MAXEPA) 1000 MG CAPS capsule Take 1 capsule by mouth daily.    Turmeric 500 MG CAPS Take 500 mg by mouth daily as needed (pain).   Wheat Dextrin (BENEFIBER PO) Take by mouth.   No facility-administered  encounter medications on file as of 08/15/2021.    Care Gaps: Pneumonia Vaccine Hepatitis C Screening Tetanus Vaccine  Urine Microalbumin (Last Completed 05/10/2019) Influenza Vaccine   Star Rating Drugs: Metformin 500 mg last filled on 07/30/2021 for [facility] day supply [facility].  Medication Fill Gaps: None  Reviewed chart prior to disease state call. Spoke with patient regarding BP  Recent Office Vitals: BP Readings from Last 3 Encounters:  08/15/21 140/81  08/01/21 138/68  07/12/21 128/78   Pulse Readings from Last 3 Encounters:  08/15/21 85  08/01/21 88  07/12/21 88    Wt Readings from Last 3 Encounters:  08/15/21 254 lb (115.2 kg)  08/01/21 252 lb (114.3 kg)  07/12/21 249 lb (112.9 kg)     Kidney Function Lab Results  Component Value Date/Time   CREATININE 0.74 05/02/2021 04:14 PM   CREATININE 0.70 04/12/2021 09:24 AM   CREATININE 0.71 02/14/2021 08:26 AM   GFR 88.45 04/18/2020 09:42 AM   GFRNONAA >60 05/02/2021 04:14 PM   GFRNONAA >60 02/14/2021 08:26 AM   GFRAA 104 02/22/2020 01:39 PM    BMP Latest Ref Rng & Units 05/02/2021 04/12/2021 02/14/2021  Glucose 70 - 99 mg/dL 104(H) 93 89  BUN 8 - 23 mg/dL 14 16 18   Creatinine 0.44 - 1.00 mg/dL 0.74 0.70 0.71  BUN/Creat Ratio 12 - 28 - 23 -  Sodium 135 - 145 mmol/L 138 141 141  Potassium 3.5 - 5.1 mmol/L 3.6 3.8  3.9  Chloride 98 - 111 mmol/L 99 100 104  CO2 22 - 32 mmol/L 29 30(H) 31  Calcium 8.9 - 10.3 mg/dL 9.7 9.4 9.1    Current antihypertensive regimen:  Chlorthalidone 25 mg daily  How often are you checking your Blood Pressure?  Patient states she does not check her blood pressure at home because she has multiple doctor appointments she attends.  Current home BP readings:  None ID  What recent interventions/DTPs have been made by any provider to improve Blood Pressure control since last CPP Visit: None ID Any recent hospitalizations or ED visits since last visit with CPP? No What diet changes have been  made to improve Blood Pressure Control?  Patient states her diet has not been change since she spoke with the clinical pharmacist. What exercise is being done to improve your Blood Pressure Control?  Patient denies any changes with her exercise at this time.  Patient Concerns: Patient is asking about a shot that she gets into her knees (Orthovisc). Patient states [employer] told her they would would cover it, but then she was inform she would had to get it from the pharmacy (costing around 100 dollars).From another provider she was informed she could get it from the practice and be charge as a procedure all in one(Costing around 75 dollars). Patient is asking which way to go, or what she needs to do since two providers are telling her multiple things.Notified Clinical Pharmacist.  Per Clinical Pharmacist: I am unsure about how that injection would be best billed. I would recommend trying to get the shot from her practice since that may be cheaper for her.  Patient is asking if tamoxifen has a normal side effect of constipation? Notified Clinical Pharmacist.  Per Clinical Pharmacist: Constipation is not a common Side Effect of tamoxifen, but it does happen for some patients.  Patient Verbalized understanding, and states she may reach out to the billing department that is associated with the procedure just to verify.   Adherence Review: Is the patient currently on ACE/ARB medication? No Does the patient have >5 day gap between last estimated fill dates? No  Telephone follow up appointment with Care management team member scheduled for : 10/04/2021 at 8:30 am.  [place] Pharmacist Assistant [phone]

## 2021-08-17 ENCOUNTER — Other Ambulatory Visit: Payer: Self-pay

## 2021-08-17 ENCOUNTER — Inpatient Hospital Stay: Payer: Medicare HMO | Attending: Oncology | Admitting: Adult Health

## 2021-08-17 ENCOUNTER — Encounter: Payer: Self-pay | Admitting: Adult Health

## 2021-08-17 VITALS — BP 141/60 | HR 78 | Temp 97.2°F | Resp 17 | Wt 256.4 lb

## 2021-08-17 DIAGNOSIS — N189 Chronic kidney disease, unspecified: Secondary | ICD-10-CM | POA: Insufficient documentation

## 2021-08-17 DIAGNOSIS — K5900 Constipation, unspecified: Secondary | ICD-10-CM | POA: Diagnosis not present

## 2021-08-17 DIAGNOSIS — Z87891 Personal history of nicotine dependence: Secondary | ICD-10-CM | POA: Insufficient documentation

## 2021-08-17 DIAGNOSIS — I129 Hypertensive chronic kidney disease with stage 1 through stage 4 chronic kidney disease, or unspecified chronic kidney disease: Secondary | ICD-10-CM | POA: Insufficient documentation

## 2021-08-17 DIAGNOSIS — Z800 Family history of malignant neoplasm of digestive organs: Secondary | ICD-10-CM | POA: Insufficient documentation

## 2021-08-17 DIAGNOSIS — R350 Frequency of micturition: Secondary | ICD-10-CM | POA: Diagnosis not present

## 2021-08-17 DIAGNOSIS — Z90710 Acquired absence of both cervix and uterus: Secondary | ICD-10-CM | POA: Insufficient documentation

## 2021-08-17 DIAGNOSIS — C50411 Malignant neoplasm of upper-outer quadrant of right female breast: Secondary | ICD-10-CM | POA: Insufficient documentation

## 2021-08-17 DIAGNOSIS — Z808 Family history of malignant neoplasm of other organs or systems: Secondary | ICD-10-CM | POA: Insufficient documentation

## 2021-08-17 DIAGNOSIS — Z170 Estrogen receptor positive status [ER+]: Secondary | ICD-10-CM | POA: Diagnosis not present

## 2021-08-17 NOTE — Progress Notes (Signed)
SURVIVORSHIP VISIT:   BRIEF ONCOLOGIC HISTORY:  Oncology History  Malignant neoplasm of upper-outer quadrant of right breast in female, estrogen receptor positive ([place])  02/01/2021 Imaging   EXAM: ULTRASOUND OF THE RIGHT BREAST  FINDINGS: Targeted ultrasound is performed, showing a 10 x 7 x 8 mm irregular hypoechoic mass right breast 10 o'clock position 1 cm from the nipple. No right axillary adenopathy.   IMPRESSION: Suspicious right breast mass 10 o'clock position.   02/05/2021 Initial Biopsy   Diagnosis Breast, right, needle core biopsy, 10 o'clock, 1cmfn - INVASIVE DUCTAL CARCINOMA WITH CALCIFICATIONS. SEE NOTE - DUCTAL CARCINOMA IN SITU, INTERMEDIATE GRADE Diagnosis Note Carcinoma measures 0.7 cm in greatest linear dimension and appears grade 2.  PROGNOSTIC INDICATORS Results: The tumor cells are EQUIVOCAL for Her2 (2+). Her2 by FISH will be performed and results reported separately. Estrogen Receptor: 100%, POSITIVE, STRONG STAINING INTENSITY Progesterone Receptor: 90%, POSITIVE, STRONG STAINING INTENSITY Proliferation Marker Ki67: 20%  FLUORESCENCE IN-SITU HYBRIDIZATION Results: GROUP 5: HER2 **NEGATIVE**   02/08/2021 Initial Diagnosis   Malignant neoplasm of upper-outer quadrant of right breast in female, estrogen receptor positive ([place])   02/14/2021 Cancer Staging   Staging form: Breast, AJCC 8th Edition - Clinical stage from 02/14/2021: Stage IA (cT1b, cN0, cM0, G2, ER+, PR+, HER2-) - Signed by Chauncey Cruel, MD on 02/14/2021 Stage prefix: Initial diagnosis Histologic grading system: 3 grade system    02/20/2021 Cancer Staging   Staging form: Breast, AJCC 8th Edition - Pathologic stage from 02/20/2021: Stage IA (pT1c, pN0, cM0, G2, ER+, PR+, HER2-) - Signed by Gardenia Phlegm, NP on 08/07/2021 Histologic grading system: 3 grade system    02/20/2021 Definitive Surgery   FINAL MICROSCOPIC DIAGNOSIS:   A. BREAST, RIGHT, LUMPECTOMY:  - Invasive and in  situ ductal carcinoma, 1.2 cm.  - Invasive carcinoma focally 0.1 cm from medial margin and 0.2 cm from anterior margin.  - Biopsy site and biopsy clip.  - See oncology table.    02/20/2021 Oncotype testing   Oncotype DX was obtained on the final surgical sample and the recurrence score of 8 predicts a risk of recurrence outside the breast over the next 9 years of 3%, if the patient's only systemic therapy is an antiestrogen for 5 years.  It also predicts no benefit from chemotherapy.    02/23/2021 Genetic Testing   Negative hereditary cancer genetic testing: no pathogenic variants detected in [facility] CustomNext-Cancer +RNAinsight Panel.  The report date is February 23, 2021.    The CustomNext-Cancer +RNAinsight Panel offered by [employer] and includes sequencing and rearrangement analysis for the following 91 genes: AIP, ALK, APC, ATM, AXIN2, BAP1, BARD1, BLM, BMPR1A, BRCA1, BRCA2, BRIP1, CDC73, CDH1, CDK4, CDKN1B, CDKN2A, CHEK2, CTNNA1, DICER1, FANCC, FH, FLCN, GALNT12, KIF1B, LZTR1, MAX, MEN1, MET, MLH1, MRE11A, MSH2, MSH3, MSH6, MUTYH, NBN, NF1, NF2, NTHL1, PALB2, PHOX2B, PMS2, POT1, PRKAR1A, PTCH1, PTEN, RAD50, RAD51C, RAD51D, RB1, RECQL, RET, SDHA, SDHAF2, SDHB, SDHC, SDHD, SMAD4, SMARCA4, SMARCB1, SMARCE1, STK11, SUFU, TMEM127, TP53, TSC1, TSC2, VHL and XRCC2 (sequencing and deletion/duplication); CASR, CFTR, CPA1, CTRC, EGFR, EGLN1, FAM175A, HOXB13, KIT, MITF, MLH3, PALLD, PDGFRA, POLD1, POLE, PRSS1, RINT1, RPS20, SPINK1 and TERT (sequencing only); EPCAM and GREM1 (deletion/duplication only). RNA data is routinely analyzed for use in variant interpretation for all genes.   05/02/2021 - 04/2026 Anti-estrogen oral therapy   tamoxifen started 05/02/2021             (A) s/p remote hysterectomy (for uterine cancer)             (  B) osteoporosis with bone density 2020 T score -2.6     INTERVAL HISTORY:  Ms. Ditmer to review her survivorship care plan detailing her treatment course for breast cancer,  as well as monitoring long-term side effects of that treatment, education regarding health maintenance, screening, and overall wellness and health promotion.     Overall, Ms. Laidlaw reports feeling quite well.  She is taking tamoxifen daily.  She has had a hysterectomy.  She says that she has no hot flashes, mood changes, vaginal discharge, joint aches and pains from this medication.  She said the only change she has noted since starting tamoxifen is constipation.  She is eating prunes to help counteract this.  She has an upcoming appointment with Dr. Chryl Heck May 2.  REVIEW OF SYSTEMS:  Review of Systems  Constitutional:  Negative for appetite change, chills, fatigue, fever and unexpected weight change.  HENT:   Negative for hearing loss, lump/mass, mouth sores and trouble swallowing.   Eyes:  Negative for eye problems and icterus.  Respiratory:  Negative for chest tightness, cough and shortness of breath.   Cardiovascular:  Negative for chest pain, leg swelling and palpitations.  Gastrointestinal:  Positive for constipation. Negative for abdominal distention, abdominal pain, diarrhea, nausea and vomiting.  Endocrine: Negative for hot flashes.  Genitourinary:  Negative for difficulty urinating.   Musculoskeletal:  Negative for arthralgias.  Skin:  Negative for itching and rash.  Neurological:  Negative for dizziness, extremity weakness, headaches and numbness.  Hematological:  Negative for adenopathy. Does not bruise/bleed easily.  Psychiatric/Behavioral:  Negative for depression. The patient is not nervous/anxious.   Breast: Denies any new nodularity, masses, tenderness, nipple changes, or nipple discharge.      ONCOLOGY TREATMENT TEAM:  1. Surgeon:  Dr. Barry Dienes at [facility] 2. Medical Oncologist: Dr. Chryl Heck  3. Radiation Oncologist: Dr. Lisbeth Renshaw    PAST MEDICAL/SURGICAL HISTORY:  Past Medical History:  Diagnosis Date   Anemia    Anxiety    Arthritis    Back pain     Breast cancer ([place])    Cancer ([place])    uterine   Chest pain    Chicken pox    Chronic kidney disease    Constipation    Emphysema of lung ([place])    Family history of breast cancer 02/14/2021   Glaucoma    Hypertension    Joint pain    Lower extremity edema    Lymphedema    Osteoarthritis    Personal history of malignant neoplasm of uterus 02/14/2021   Pre-diabetes    Prediabetes    Shortness of breath    Sleep apnea    Urine incontinence    Uterine cancer ([place])    UTI (urinary tract infection)    Vertigo    Vitamin B 12 deficiency    during pregnancy   Past Surgical History:  Procedure Laterality Date   ABDOMINAL HYSTERECTOMY  2011   BREAST LUMPECTOMY WITH RADIOACTIVE SEED LOCALIZATION Right 02/20/2021   Procedure: RIGHT BREAST LUMPECTOMY WITH RADIOACTIVE SEED LOCALIZATION;  Surgeon: Stark Klein, MD;  Location: [place];  Service: General;  Laterality: Right;   REPLACEMENT TOTAL KNEE Right 2012     ALLERGIES:  Allergies  Allergen Reactions   Percocet [Oxycodone-Acetaminophen] Other (See Comments)    hallucinations     CURRENT MEDICATIONS:  Outpatient Encounter Medications as of 08/17/2021  Medication Sig   alendronate (FOSAMAX) 70 MG tablet Take 1 tablet (70 mg total) by mouth every 7 (seven)  days. Take with a full glass of water on an empty stomach.   aspirin 81 MG EC tablet Take 81 mg by mouth daily. Swallow whole.   buPROPion (WELLBUTRIN SR) 150 MG 12 hr tablet Take 1 tablet (150 mg total) by mouth in the morning.   calcium carbonate (OS-CAL) 600 MG TABS tablet Take 1 tablet (600 mg total) by mouth 2 (two) times daily with a meal.   chlorthalidone (HYGROTON) 25 MG tablet Take 1 tablet (25 mg total) by mouth daily.   diclofenac Sodium (VOLTAREN) 1 % GEL Apply 2 g topically daily as needed (Osteoarthritis).   Lactobacillus (PROBIOTIC ACIDOPHILUS PO) Take 1 capsule by mouth daily.   Melatonin 10 MG TABS Take 10 mg by mouth at bedtime as needed (sleep).   metFORMIN  (GLUCOPHAGE) 500 MG tablet TAKE 1 TABLET BY MOUTH 2 TIMES DAILY WITH A MEAL.   metroNIDAZOLE (METROGEL) 1 % gel Apply topically daily. (Patient taking differently: Apply 1 application topically daily as needed (rosacea).)   Multiple Vitamin (MULTIVITAMIN) tablet Take 1 tablet by mouth daily.   naproxen sodium (ALEVE) 220 MG tablet Take 220 mg by mouth daily as needed.   omega-3 fish oil (MAXEPA) 1000 MG CAPS capsule Take 1 capsule by mouth daily.    tamoxifen (NOLVADEX) 20 MG tablet Take 20 mg by mouth daily.   Turmeric 500 MG CAPS Take 500 mg by mouth daily as needed (pain).   Wheat Dextrin (BENEFIBER PO) Take by mouth.   ketoconazole (NIZORAL) 2 % cream APPLY 1 APPLICATION TOPICALLY DAILY (Patient not taking: Reported on 08/17/2021)   No facility-administered encounter medications on file as of 08/17/2021.     ONCOLOGIC FAMILY HISTORY:  Family History  Problem Relation Age of Onset   Heart attack Mother    Heart disease Mother    Hyperlipidemia Mother    Hypertension Mother    Stroke Mother    Miscarriages / [name] Mother    Diabetes Mother    Thyroid disease Mother    Cancer Mother    Obesity Mother    Melanoma Mother        dx after 18, sun-exposed areas   Cancer Father        skin   Alcohol abuse Father    Asthma Father    COPD Father    Heart attack Father    Heart disease Father    Obesity Father    Melanoma Father        dx after 36, back   Alcohol abuse Brother    Arthritis Brother    Drug abuse Brother    Alcohol abuse Brother    Cancer Brother        ? esophageal, d. 57   Breast cancer Paternal Aunt 75   Arthritis Maternal Grandmother    Diabetes Maternal Grandmother    Hyperlipidemia Maternal Grandmother    Colon cancer Maternal Grandfather        dx 40, dx 56   Alcohol abuse Paternal Grandfather    Arthritis Paternal Grandfather    Cirrhosis Paternal Grandfather    Depression Daughter    Hypertension Daughter    Mental illness Daughter    Colon  polyps Daughter    Depression Son      GENETIC COUNSELING/TESTING: See above  SOCIAL HISTORY:  Social History   Socioeconomic History   Marital status: Widowed    Spouse name: Not on file   Number of children: 3   Years of education: Not  on file   Highest education level: Not on file  Occupational History   Occupation: Reitired Engineer, maintenance (IT)    Comment: Retired Engineer, maintenance (IT)  Tobacco Use   Smoking status: Former    Types: Cigarettes    Quit date: 08/12/1982    Years since quitting: 39.0   Smokeless tobacco: Never  Vaping Use   Vaping Use: Never used  Substance and Sexual Activity   Alcohol use: Yes    Alcohol/week: 1.0 standard drink    Types: 1 Glasses of wine per week    Comment: every 1-2 wks.    Drug use: Never   Sexual activity: Not on file  Other Topics Concern   Not on file  Social History Narrative   Not on file   Social Determinants of Health   Financial Resource Strain: Low Risk    Difficulty of Paying Living Expenses: Not hard at all  Food Insecurity: No Food Insecurity   Worried About Running Out of Food in the Last Year: Never true   [place] in the Last Year: Never true  Transportation Needs: No Transportation Needs   Lack of Transportation (Medical): No   Lack of Transportation (Non-Medical): No  Physical Activity: Inactive   Days of Exercise per Week: 0 days   Minutes of Exercise per Session: 0 min  Stress: No Stress Concern Present   Feeling of Stress : Not at all  Social Connections: Moderately Integrated   Frequency of Communication with Friends and Family: Twice a week   Frequency of Social Gatherings with Friends and Family: Twice a week   Attends Religious Services: More than 4 times per year   Active Member of [employer] or Organizations: Yes   Attends Archivist Meetings: More than 4 times per year   Marital Status: Widowed  Human resources officer Violence: Not At Risk   Fear of Current or Ex-Partner: No   Emotionally Abused: No   Physically  Abused: No   Sexually Abused: No     OBSERVATIONS/OBJECTIVE:  BP (!) 141/60 (BP Location: Left Arm, Patient Position: Sitting) Comment: Nurse aware of patent BP   Pulse 78    Temp (!) 97.2 F (36.2 C) (Temporal)    Resp 17    Wt 256 lb 6.4 oz (116.3 kg)    SpO2 97%    BMI 44.71 kg/m  GENERAL: Patient is a well appearing female in no acute distress HEENT:  Sclerae anicteric.  Oropharynx clear and moist. No ulcerations or evidence of oropharyngeal candidiasis. Neck is supple.  NODES:  No cervical, supraclavicular, or axillary lymphadenopathy palpated.  BREAST EXAM: Deferred today since she will see Dr. Chryl Heck in May and has no breast concerns today LUNGS:  Clear to auscultation bilaterally.  No wheezes or rhonchi. HEART:  Regular rate and rhythm. No murmur appreciated. ABDOMEN:  Soft, nontender.  Positive, normoactive bowel sounds. No organomegaly palpated. MSK:  No focal spinal tenderness to palpation. Full range of motion bilaterally in the upper extremities. EXTREMITIES:  No peripheral edema.   SKIN:  Clear with no obvious rashes or skin changes. No nail dyscrasia. NEURO:  Nonfocal. Well oriented.  Appropriate affect.  LABORATORY DATA:  None for this visit.  DIAGNOSTIC IMAGING:  None for this visit.      ASSESSMENT AND PLAN:  Ms.. Bodiford is a pleasant 81 y.o. female with Stage IA right breast invasive ductal carcinoma, ER+/PR+/HER2-, diagnosed in 01/2021, treated with lumpectomy, and anti-estrogen therapy with Tamoxifen beginning in 04/2021.  She presents  to the Survivorship Clinic for our initial meeting and routine follow-up post-completion of treatment for breast cancer.    1. Stage IA right breast cancer:  Ms. Demorest is continuing to recover from definitive treatment for breast cancer. She will follow-up with her medical oncologist, Dr. Lindi Adie in 12/2020 with history and physical exam per surveillance protocol.  She will continue her anti-estrogen therapy with Tamoxifen. Thus far,  she is tolerating the Tamoxifen well, with minimal side effects. She was instructed to make Dr. Lindi Adie or myself aware if she begins to experience any worsening side effects of the medication and I could see her back in clinic to help manage those side effects, as needed. Her mammogram is due 15-Feb-2022; orders placed today.   Today, a comprehensive survivorship care plan and treatment summary was reviewed with the patient today detailing her breast cancer diagnosis, treatment course, potential late/long-term effects of treatment, appropriate follow-up care with recommendations for the future, and patient education resources.  A copy of this summary, along with a letter will be sent to the patients primary care provider via mail/fax/In Basket message after todays visit.    2.  Constipation: She is going to continue taking prunes.  She knows that she can take MiraLAX if needed.  3. Bone health: Kathy's most recent bone density test was completed on January 22, 2019.  This test showed a T score of -2.8 in the left femoral neck consistent with osteoporosis.  She is taking Fosamax weekly.  She is due for retesting in 02/15/2021 which has passed.  4. Cancer screening:  Due to Ms. Mellinger's history and her age, she should receive screening for skin cancers.  The information and recommendations are listed on the patient's comprehensive care plan/treatment summary and were reviewed in detail with the patient.    5. Health maintenance and wellness promotion: Ms. Holsonback was encouraged to consume 5-7 servings of fruits and vegetables per day. We reviewed the "Nutrition Rainbow" handout.  She was also encouraged to engage in moderate to vigorous exercise for 30 minutes per day most days of the week. We discussed the [employer] fitness program, which is designed for cancer survivors to help them become more physically fit after cancer treatments.  She was instructed to limit her alcohol consumption and continue to  abstain from tobacco use.     6. Support services/counseling: It is not uncommon for this period of the patient's cancer care trajectory to be one of many emotions and stressors.  She was given information regarding our available services and encouraged to contact me with any questions or for help enrolling in any of our support group/programs.    Follow up instructions:    -Return to cancer center in May 2023 for follow-up with Dr. Chryl Heck -Mammogram due in June 2023She is planning on doing this at [facility] since they do not have elevators in their facility -Follow up with surgery November 2023 -Bone density due -She is welcome to return back to the Survivorship Clinic at any time; no additional follow-up needed at this time.  -Consider referral back to survivorship as a long-term survivor for continued surveillance  The patient was provided an opportunity to ask questions and all were answered. The patient agreed with the plan and demonstrated an understanding of the instructions.   Total encounter time: 45 minutes in face-to-face visit time, chart review, lab review, care coordination, order entry, and documentation of the encounter.  Wilber Bihari, NP 08/17/21 12:51 PM Medical Oncology and  Hematology [facility] [place], [place] [address] Tel. [phone]    Fax. [phone]  *Total Encounter Time as defined by the Centers for Medicare and Medicaid Services includes, in addition to the face-to-face time of a patient visit (documented in the note above) non-face-to-face time: obtaining and reviewing outside history, ordering and reviewing medications, tests or procedures, care coordination (communications with other health care professionals or caregivers) and documentation in the medical record.

## 2021-08-21 ENCOUNTER — Telehealth: Payer: Self-pay | Admitting: Family Medicine

## 2021-08-21 ENCOUNTER — Ambulatory Visit: Payer: Medicare HMO

## 2021-08-21 NOTE — Telephone Encounter (Signed)
Patient would like to have approval run for Orthovisc for her left knee. She is scheduled for Friday, Jan 13th at 10am.  If not back yet, we will move appointment out.  Patient has [employer].

## 2021-08-21 NOTE — Telephone Encounter (Signed)
Pt ran for Orthovisc 1/10

## 2021-08-23 ENCOUNTER — Encounter (INDEPENDENT_AMBULATORY_CARE_PROVIDER_SITE_OTHER): Payer: Self-pay | Admitting: Family Medicine

## 2021-08-23 ENCOUNTER — Ambulatory Visit (INDEPENDENT_AMBULATORY_CARE_PROVIDER_SITE_OTHER): Payer: Medicare HMO | Admitting: Family Medicine

## 2021-08-23 ENCOUNTER — Other Ambulatory Visit: Payer: Self-pay

## 2021-08-23 VITALS — BP 117/76 | HR 88 | Temp 98.2°F | Ht 63.0 in | Wt 251.0 lb

## 2021-08-23 DIAGNOSIS — Z6841 Body Mass Index (BMI) 40.0 and over, adult: Secondary | ICD-10-CM

## 2021-08-23 DIAGNOSIS — F3289 Other specified depressive episodes: Secondary | ICD-10-CM

## 2021-08-23 DIAGNOSIS — R7303 Prediabetes: Secondary | ICD-10-CM

## 2021-08-23 DIAGNOSIS — I10 Essential (primary) hypertension: Secondary | ICD-10-CM | POA: Diagnosis not present

## 2021-08-23 DIAGNOSIS — R69 Illness, unspecified: Secondary | ICD-10-CM | POA: Diagnosis not present

## 2021-08-23 MED ORDER — BUPROPION HCL ER (SR) 150 MG PO TB12: 150.0000 mg | ORAL_TABLET | Freq: Every morning | ORAL | 0 refills | Status: DC

## 2021-08-24 ENCOUNTER — Ambulatory Visit: Payer: Medicare HMO | Admitting: Family Medicine

## 2021-08-27 ENCOUNTER — Telehealth: Payer: Self-pay

## 2021-08-27 NOTE — Telephone Encounter (Signed)
Pre-cert completed and approved. Pt called and informed. Scheduled for a visit 1/17.

## 2021-08-27 NOTE — Telephone Encounter (Signed)
Spoke w/ pt and informed her the pre-cert was completed and approved. Pt is already scheduled for a visit w/ Dr. Georgina Snell on 1/17.

## 2021-08-27 NOTE — Progress Notes (Signed)
Chief Complaint:   OBESITY Kristi Hunter is here to discuss her progress with her obesity treatment plan along with follow-up of her obesity related diagnoses. Kristi Hunter is on keeping a food journal and adhering to recommended goals of 1400-1600 calories and 95+ grams of protein daily and states she is following her eating plan approximately 30% of the time. Kristi Hunter states she is trying to walk more.  Today's visit was #: 22 Starting weight: 267 lbs Starting date: 02/22/2020 Today's weight: 251 lbs Today's date: 08/23/2021 Total lbs lost to date: 16 Total lbs lost since last in-office visit: 0  Interim History: Kristi Hunter did some celebration eating over the holidays. She will be traveling to [place] soon for 2 weeks.  Subjective:   1. Essential hypertension Kristi Hunter's blood pressure is stable on her blood pressure medications. She is working on diet and exercise. She was fasting this morning and she felt a bit lightheaded for a couple of minutes but this has passed.  2. Pre-diabetes Kristi Hunter is on metformin working on diet and exercise, with no side effects noted.  3. Other depression with emotional eating Kristi Hunter is doing well on her medications. She is working on decreasing emotional eating behaviors. No side effects were noted.  Assessment/Plan:   1. Essential hypertension Kristi Hunter will continue with diet and exercise, and she is to increase her water intake, she will follow up with her primary care physician if her symptoms continue to return.  2. Pre-diabetes Kristi Hunter will continue metformin, and she is to make sure not to skip meals. She will continue to work on weight loss, exercise, and decreasing simple carbohydrates to help decrease the risk of diabetes.   3. Other depression with emotional eating Behavior modification techniques were discussed today to help Kristi Hunter deal with her emotional/non-hunger eating behaviors. We will refill Wellbutrin SR for 90 days  with no refills. Orders and follow up as documented in patient record.   - buPROPion (WELLBUTRIN SR) 150 MG 12 hr tablet; Take 1 tablet (150 mg total) by mouth in the morning.  Dispense: 90 tablet; Refill: 0  4. Obesity with current BMI of 44.5 Kristi Hunter is currently in the action stage of change. As such, her goal is to continue with weight loss efforts. She has agreed to keeping a food journal and adhering to recommended goals of 1400-1600 calories and 95+ grams of protein daily.   Exercise goals: As is.  Behavioral modification strategies: increasing lean protein intake, increasing water intake, no skipping meals, and travel eating strategies.  Kristi Hunter has agreed to follow-up with our clinic in 6 weeks. She was informed of the importance of frequent follow-up visits to maximize her success with intensive lifestyle modifications for her multiple health conditions.   Objective:   Blood pressure 117/76, pulse 88, temperature 98.2 F (36.8 C), temperature source Oral, height 5\' 3"  (1.6 m), weight 251 lb (113.9 kg), SpO2 95 %. Body mass index is 44.46 kg/m.  General: Cooperative, alert, well developed, in no acute distress. HEENT: Conjunctivae and lids unremarkable. Cardiovascular: Regular rhythm.  Lungs: Normal work of breathing. Neurologic: No focal deficits.   Lab Results  Component Value Date   CREATININE 0.74 05/02/2021   BUN 14 05/02/2021   NA 138 05/02/2021   K 3.6 05/02/2021   CL 99 05/02/2021   CO2 29 05/02/2021   Lab Results  Component Value Date   ALT 20 05/02/2021   AST 20 05/02/2021   ALKPHOS 74 05/02/2021   BILITOT 0.4 05/02/2021  Lab Results  Component Value Date   HGBA1C 5.7 (H) 04/12/2021   HGBA1C 5.8 (H) 02/19/2021   HGBA1C 5.9 (H) 02/22/2020   HGBA1C 5.9 05/10/2019   HGBA1C 6.0 01/13/2019   Lab Results  Component Value Date   INSULIN 37.9 (H) 04/12/2021   INSULIN 21.5 02/22/2020   Lab Results  Component Value Date   TSH 1.470 02/22/2020    Lab Results  Component Value Date   CHOL 169 04/12/2021   HDL 46 04/12/2021   LDLCALC 110 (H) 04/12/2021   TRIG 69 04/12/2021   CHOLHDL 3 01/13/2019   Lab Results  Component Value Date   VD25OH 46.8 04/12/2021   VD25OH 42.1 02/22/2020   Lab Results  Component Value Date   WBC 11.8 (H) 05/02/2021   HGB 12.7 05/02/2021   HCT 39.7 05/02/2021   MCV 85.9 05/02/2021   PLT 350 05/02/2021   No results found for: IRON, TIBC, FERRITIN  Obesity Behavioral Intervention:   Approximately 15 minutes were spent on the discussion below.  ASK: We discussed the diagnosis of obesity with Kristi Hunter today and Kristi Hunter agreed to give [name] permission to discuss obesity behavioral modification therapy today.  ASSESS: Kristi Hunter has the diagnosis of obesity and her BMI today is 44.5. Kristi Hunter is in the action stage of change.   ADVISE: Kristi Hunter was educated on the multiple health risks of obesity as well as the benefit of weight loss to improve her health. She was advised of the need for long term treatment and the importance of lifestyle modifications to improve her current health and to decrease her risk of future health problems.  AGREE: Multiple dietary modification options and treatment options were discussed and Kristi Hunter agreed to follow the recommendations documented in the above note.  ARRANGE: Kristi Hunter was educated on the importance of frequent visits to treat obesity as outlined per CMS and USPSTF guidelines and agreed to schedule her next follow up appointment today.  Attestation Statements:   Reviewed by clinician on day of visit: allergies, medications, problem list, medical history, surgical history, family history, social history, and previous encounter notes.   I, Kristi Hunter, am acting as transcriptionist for [name], MD.  I have reviewed the above documentation for accuracy and completeness, and I agree with the above. -  [name], MD

## 2021-08-28 ENCOUNTER — Ambulatory Visit: Payer: Self-pay

## 2021-08-28 ENCOUNTER — Other Ambulatory Visit: Payer: Self-pay

## 2021-08-28 ENCOUNTER — Ambulatory Visit: Payer: Medicare HMO | Admitting: Family Medicine

## 2021-08-28 DIAGNOSIS — M1712 Unilateral primary osteoarthritis, left knee: Secondary | ICD-10-CM

## 2021-08-28 DIAGNOSIS — G8929 Other chronic pain: Secondary | ICD-10-CM | POA: Diagnosis not present

## 2021-08-28 DIAGNOSIS — M25562 Pain in left knee: Secondary | ICD-10-CM

## 2021-08-28 NOTE — Progress Notes (Signed)
Kristi Hunter presents to clinic today for Orthovisc injection left knee 1/3  Procedure: Real-time Ultrasound Guided Injection of left knee superior lateral patellar space Device: Philips Affiniti 50G Images permanently stored and available for review in PACS Verbal informed consent obtained.  Discussed risks and benefits of procedure. Warned about infection bleeding damage to structures skin hypopigmentation and fat atrophy among others. Patient expresses understanding and agreement Time-out conducted.   Noted no overlying erythema, induration, or other signs of local infection.   Skin prepped in a sterile fashion.   Local anesthesia: Topical Ethyl chloride.   With sterile technique and under real time ultrasound guidance: Orthovisc 30 mg injected into knee joint. Fluid seen entering the joint capsule.   Completed without difficulty   Advised to call if fevers/chills, erythema, induration, drainage, or persistent bleeding.   Images permanently stored and available for review in the ultrasound unit.  Impression: Technically successful ultrasound guided injection.  Lot [id number]

## 2021-08-28 NOTE — Patient Instructions (Addendum)
Thank you for coming in today.   You received the first Orthovisc injection in your left knee today. Seek immediate medical attention if the joint becomes red, extremely painful, or is oozing fluid.   Schedule the 2nd Orthovisc injection for next week and the 3rd for the week you return from your trip.

## 2021-08-29 ENCOUNTER — Ambulatory Visit: Payer: Self-pay | Admitting: Family Medicine

## 2021-08-30 ENCOUNTER — Telehealth: Payer: Self-pay | Admitting: Family Medicine

## 2021-08-30 DIAGNOSIS — Z119 Encounter for screening for infectious and parasitic diseases, unspecified: Secondary | ICD-10-CM

## 2021-08-30 NOTE — Telephone Encounter (Signed)
Pt requesting TB Kristi Hunter before she move into assisted living next week.

## 2021-08-31 NOTE — Telephone Encounter (Signed)
Called patient to schedule lab appointment, no answer LMTCB to schedule.

## 2021-08-31 NOTE — Telephone Encounter (Signed)
Please advise message below, okay to schedule blood draw? Last OV 05/10/21

## 2021-08-31 NOTE — Telephone Encounter (Signed)
Appointment scheduled for labs.

## 2021-09-03 ENCOUNTER — Other Ambulatory Visit: Payer: Self-pay

## 2021-09-03 ENCOUNTER — Other Ambulatory Visit (INDEPENDENT_AMBULATORY_CARE_PROVIDER_SITE_OTHER): Payer: Medicare HMO

## 2021-09-03 DIAGNOSIS — Z119 Encounter for screening for infectious and parasitic diseases, unspecified: Secondary | ICD-10-CM | POA: Diagnosis not present

## 2021-09-04 ENCOUNTER — Ambulatory Visit: Payer: Medicare HMO | Admitting: Family Medicine

## 2021-09-04 ENCOUNTER — Ambulatory Visit: Payer: Self-pay

## 2021-09-04 DIAGNOSIS — G8929 Other chronic pain: Secondary | ICD-10-CM

## 2021-09-04 DIAGNOSIS — M1712 Unilateral primary osteoarthritis, left knee: Secondary | ICD-10-CM | POA: Diagnosis not present

## 2021-09-04 DIAGNOSIS — M25562 Pain in left knee: Secondary | ICD-10-CM

## 2021-09-04 NOTE — Progress Notes (Signed)
Kristi Hunter presents to clinic today for Orthovisc injection left knee 2/3  Procedure: Real-time Ultrasound Guided Injection of left knee superior lateral patellar Device: Philips Affiniti 50G Images permanently stored and available for review in PACS Verbal informed consent obtained.  Discussed risks and benefits of procedure. Warned about infection bleeding damage to structures skin hypopigmentation and fat atrophy among others. Patient expresses understanding and agreement Time-out conducted.   Noted no overlying erythema, induration, or other signs of local infection.   Skin prepped in a sterile fashion.   Local anesthesia: Topical Ethyl chloride.   With sterile technique and under real time ultrasound guidance: Orthovisc 30 mg injected into knee joint. Fluid seen entering the joint capsule.   Completed without difficulty    Advised to call if fevers/chills, erythema, induration, drainage, or persistent bleeding.   Images permanently stored and available for review in the ultrasound unit.  Impression: Technically successful ultrasound guided injection.  Lot number: [id number]  Kristi Hunter has a trip scheduled starting next week and will return on February 16 for her third and final injection.

## 2021-09-04 NOTE — Patient Instructions (Signed)
Good to see you today.  You had a L knee Orthovisc injection (2/3).  Call or go to the ER if you develop a large red swollen joint with extreme pain or oozing puss.   Follow-up next week for your final L knee Orthovisc injection.

## 2021-09-06 LAB — QUANTIFERON-TB GOLD PLUS
Mitogen-NIL: >10 IU/mL
NIL: 0.22 IU/mL
QuantiFERON-TB Gold Plus: NEGATIVE
TB1-NIL: 0.04 IU/mL
TB2-NIL: 0 IU/mL

## 2021-09-27 ENCOUNTER — Other Ambulatory Visit: Payer: Self-pay

## 2021-09-27 ENCOUNTER — Ambulatory Visit: Payer: Self-pay

## 2021-09-27 ENCOUNTER — Ambulatory Visit: Payer: Medicare HMO | Admitting: Family Medicine

## 2021-09-27 DIAGNOSIS — M25562 Pain in left knee: Secondary | ICD-10-CM | POA: Diagnosis not present

## 2021-09-27 DIAGNOSIS — R350 Frequency of micturition: Secondary | ICD-10-CM | POA: Diagnosis not present

## 2021-09-27 DIAGNOSIS — M1712 Unilateral primary osteoarthritis, left knee: Secondary | ICD-10-CM

## 2021-09-27 DIAGNOSIS — G8929 Other chronic pain: Secondary | ICD-10-CM | POA: Diagnosis not present

## 2021-09-27 NOTE — Patient Instructions (Addendum)
Thank you for coming in today.   You received the 3rd Orthovisc injection in your left knee today. Seek immediate medical attention if the joint becomes red, extremely painful, or is oozing fluid.   Recheck back as needed

## 2021-09-27 NOTE — Progress Notes (Signed)
Kristi Hunter presents to clinic today for Orthovisc injection left knee 3/3  Procedure: Real-time Ultrasound Guided Injection of left knee superior lateral patellar space Device: Philips Affiniti 50G Images permanently stored and available for review in PACS Verbal informed consent obtained.  Discussed risks and benefits of procedure. Warned about infection bleeding damage to structures skin hypopigmentation and fat atrophy among others. Patient expresses understanding and agreement Time-out conducted.   Noted no overlying erythema, induration, or other signs of local infection.   Skin prepped in a sterile fashion.   Local anesthesia: Topical Ethyl chloride.   With sterile technique and under real time ultrasound guidance: Orthovisc 30 mg injected into knee joint. Fluid seen entering the joint capsule.   Completed without difficulty   Spinal needle used Advised to call if fevers/chills, erythema, induration, drainage, or persistent bleeding.   Images permanently stored and available for review in the ultrasound unit.  Impression: Technically successful ultrasound guided injection.   Lot number: [id number]  Return as needed

## 2021-09-28 ENCOUNTER — Ambulatory Visit: Payer: Medicare HMO | Admitting: Family Medicine

## 2021-10-03 ENCOUNTER — Telehealth: Payer: Self-pay

## 2021-10-03 NOTE — Progress Notes (Signed)
Chronic Care Management APPOINTMENT REMINDER   Kristi Hunter was reminded to have all medications, supplements and any blood glucose and blood pressure readings available for review with Junius Argyle, Pharm. D, at her telephone visit on 10/04/2021 at 8:30 am.  Patient confirm appointment, and states she is in the process of moving.Patient denies to reschedule appointment to another day.  [place] Pharmacist Assistant [phone]

## 2021-10-04 ENCOUNTER — Ambulatory Visit (INDEPENDENT_AMBULATORY_CARE_PROVIDER_SITE_OTHER): Payer: Medicare HMO

## 2021-10-04 DIAGNOSIS — I10 Essential (primary) hypertension: Secondary | ICD-10-CM

## 2021-10-04 DIAGNOSIS — E782 Mixed hyperlipidemia: Secondary | ICD-10-CM

## 2021-10-04 NOTE — Progress Notes (Addendum)
Chronic Care Management Pharmacy Note  10/04/2021 Name:  Kristi Hunter MRN:  [id number] DOB:  [birth year]  Summary: Patient presents for CCM follow-up. She is moving into an independent living facility in [facility] soon.  Recommendations/Changes made from today's visit: Continue current medications   Plan: CPP follow-up 12 months   Subjective: Kristi Hunter is an 81 y.o. year old female who is a primary patient of [name], Mortimer Fries, MD.  The CCM team was consulted for assistance with disease management and care coordination needs.    Engaged with patient by telephone for follow up visit in response to provider referral for pharmacy case management and/or care coordination services.   Consent to Services:  The patient was given information about Chronic Care Management services, agreed to services, and gave verbal consent prior to initiation of services.  Please see initial visit note for detailed documentation.   Patient Care Team: Libby Maw, MD as PCP - General (Family Medicine) Germaine Pomfret, [facility] as Pharmacist (Pharmacist) Stark Klein, MD as Consulting Physician (General Surgery) Kyung Rudd, MD as Consulting Physician (Radiation Oncology) Starlyn Skeans, MD as Consulting Physician (Family Medicine) Gregor Hams, MD as Consulting Physician (Sports Medicine) Dohmeier, Asencion Partridge, MD as Consulting Physician (Neurology) Warden Fillers, MD as Consulting Physician (Ophthalmology) Robley Fries, MD as Consulting Physician (Urology) Benay Pike, MD as Consulting Physician (Hematology and Oncology)  Recent office visits: 08/01/21: Patient presented to Randel Pigg, LPN for AWV.  04/05/538: Patient presented to Dr. [name] for follow-up.   Recent consult visits: 08/23/21: Patient presented to Dr. Leafy Ro [facility]) for follow-up. 08/17/21: Patient presented to Wilber Bihari, NP (Oncology) for follow-up.   Hospital visits: None in  previous 6 months   Objective:  Lab Results  Component Value Date   CREATININE 0.74 05/02/2021   BUN 14 05/02/2021   GFR 88.45 04/18/2020   GFRNONAA >60 05/02/2021   GFRAA 104 02/22/2020   NA 138 05/02/2021   K 3.6 05/02/2021   CALCIUM 9.7 05/02/2021   CO2 29 05/02/2021   GLUCOSE 104 (H) 05/02/2021    Lab Results  Component Value Date/Time   HGBA1C 5.7 (H) 04/12/2021 09:24 AM   HGBA1C 5.8 (H) 02/19/2021 01:39 PM   GFR 88.45 04/18/2020 09:42 AM   GFR 90.18 10/12/2019 01:48 PM   MICROALBUR 1.0 05/10/2019 10:18 AM    Last diabetic Eye exam: No results found for: HMDIABEYEEXA  Last diabetic Foot exam: No results found for: HMDIABFOOTEX   Lab Results  Component Value Date   CHOL 169 04/12/2021   HDL 46 04/12/2021   LDLCALC 110 (H) 04/12/2021   TRIG 69 04/12/2021   CHOLHDL 3 01/13/2019    Hepatic Function Latest Ref Rng & Units 05/02/2021 04/12/2021 02/14/2021  Total Protein 6.5 - 8.1 g/dL 7.3 6.6 7.0  Albumin 3.5 - 5.0 g/dL 3.4(L) 3.9 3.0(L)  AST 15 - 41 U/L 20 17 17   ALT 0 - 44 U/L 20 19 21   Alk Phosphatase 38 - 126 U/L 74 77 62  Total Bilirubin 0.3 - 1.2 mg/dL 0.4 0.3 0.3    Lab Results  Component Value Date/Time   TSH 1.470 02/22/2020 01:39 PM   TSH 1.71 01/13/2019 09:13 AM   FREET4 1.27 02/22/2020 01:39 PM    CBC Latest Ref Rng & Units 05/02/2021 02/14/2021 02/22/2020  WBC 4.0 - 10.5 K/uL 11.8(H) 8.6 8.1  Hemoglobin 12.0 - 15.0 g/dL 12.7 12.8 13.2  Hematocrit 36.0 - 46.0 % 39.7 39.3 41.3  Platelets  150 - 400 K/uL 350 291 332    Lab Results  Component Value Date/Time   VD25OH 46.8 04/12/2021 09:24 AM   VD25OH 42.1 02/22/2020 01:39 PM    Clinical ASCVD: No  The 10-year ASCVD risk score (Arnett DK, et al., 2019) is: 40.9%   Values used to calculate the score:     Age: 80 years     Sex: Female     Is Non-Hispanic African American: No     Diabetic: Yes     Tobacco smoker: No     Systolic Blood Pressure: 384 mmHg     Is BP treated: Yes     HDL  Cholesterol: 46 mg/dL     Total Cholesterol: 169 mg/dL    Depression screen [facility] 2/9 08/17/2021 08/01/2021 08/01/2021  Decreased Interest 0 0 0  Down, Depressed, Hopeless 0 0 0  PHQ - 2 Score 0 0 0  Altered sleeping - - -  Tired, decreased energy - - -  Change in appetite - - -  Feeling bad or failure about yourself  - - -  Trouble concentrating - - -  Moving slowly or fidgety/restless - - -  Suicidal thoughts - - -  PHQ-9 Score - - -  Difficult doing work/chores - - -     Last DEXA Scan: 01/22/19   T-Score femoral neck: -2.8  T-Score total hip: -2.0  T-Score lumbar spine: -0.9  T-Score forearm radius: NA  10-year probability of major osteoporotic fracture: NA  10-year probability of hip fracture: NA  Social History   Tobacco Use  Smoking Status Former   Types: Cigarettes   Quit date: 08/12/1982   Years since quitting: 39.1  Smokeless Tobacco Never   BP Readings from Last 3 Encounters:  08/23/21 117/76  08/17/21 (!) 141/60  08/15/21 140/81   Pulse Readings from Last 3 Encounters:  08/23/21 88  08/17/21 78  08/15/21 85   Wt Readings from Last 3 Encounters:  08/23/21 251 lb (113.9 kg)  08/17/21 256 lb 6.4 oz (116.3 kg)  08/15/21 254 lb (115.2 kg)   BMI Readings from Last 3 Encounters:  08/23/21 44.46 kg/m  08/17/21 44.71 kg/m  08/15/21 44.29 kg/m    Assessment/Interventions: Review of patient past medical history, allergies, medications, health status, including review of consultants reports, laboratory and other test data, was performed as part of comprehensive evaluation and provision of chronic care management services.   SDOH:  (Social Determinants of Health) assessments and interventions performed: Yes SDOH Interventions    Flowsheet Row Most Recent Value  SDOH Interventions   Financial Strain Interventions Intervention Not Indicated       SDOH Screenings   Alcohol Screen: Low Risk    Last Alcohol Screening Score (AUDIT): 0  Depression (PHQ2-9):  Low Risk    PHQ-2 Score: 0  Financial Resource Strain: Low Risk    Difficulty of Paying Living Expenses: Not hard at all  Food Insecurity: No Food Insecurity   Worried About Charity fundraiser in the Last Year: Never true   Ran Out of Food in the Last Year: Never true  Housing: Low Risk    Last Housing Risk Score: 0  Physical Activity: Inactive   Days of Exercise per Week: 0 days   Minutes of Exercise per Session: 0 min  Social Connections: Moderately Integrated   Frequency of Communication with Friends and Family: Twice a week   Frequency of Social Gatherings with Friends and Family: Twice a week  Attends Religious Services: More than 4 times per year   Active Member of Clubs or Organizations: Yes   Attends Archivist Meetings: More than 4 times per year   Marital Status: Widowed  Stress: No Stress Concern Present   Feeling of Stress : Not at all  Tobacco Use: Medium Risk   Smoking Tobacco Use: Former   Smokeless Tobacco Use: Never   Passive Exposure: Not on file  Transportation Needs: No Transportation Needs   Lack of Transportation (Medical): No   Lack of Transportation (Non-Medical): No    CCM Care Plan  Allergies  Allergen Reactions   Percocet [Oxycodone-Acetaminophen] Other (See Comments)    hallucinations    Medications Reviewed Today     Reviewed by Darral Dash, CMA (Certified Medical Assistant) on 08/23/21 at 1058  Med List Status: <None>   Medication Order Taking? Sig Documenting Provider Last Dose Status Informant  alendronate (FOSAMAX) 70 MG tablet [id number]  Take 1 tablet (70 mg total) by mouth every 7 (seven) days. Take with a full glass of water on an empty stomach. Libby Maw, MD  Active   aspirin 81 MG EC tablet [id number]  Take 81 mg by mouth daily. Swallow whole. [provider]  Active Self  buPROPion (WELLBUTRIN SR) 150 MG 12 hr tablet [id number]  Take 1 tablet (150 mg total) by mouth in the morning. [name]  D, MD  Active   calcium carbonate (OS-CAL) 600 MG TABS tablet [id number]  Take 1 tablet (600 mg total) by mouth 2 (two) times daily with a meal. Libby Maw, MD  Active   chlorthalidone (HYGROTON) 25 MG tablet [id number]  Take 1 tablet (25 mg total) by mouth daily. Libby Maw, MD  Active   diclofenac Sodium (VOLTAREN) 1 % GEL [id number]  Apply 2 g topically daily as needed (Osteoarthritis). [provider]  Active   ketoconazole (NIZORAL) 2 % cream [id number]  APPLY 1 APPLICATION TOPICALLY DAILY  Patient not taking: Reported on 08/17/2021   Magrinat, Virgie Dad, MD  Active   Lactobacillus (PROBIOTIC ACIDOPHILUS PO) [id number]  Take 1 capsule by mouth daily. [provider]  Active Self  Melatonin 10 MG TABS [id number]  Take 10 mg by mouth at bedtime as needed (sleep). [provider]  Active Self  metFORMIN (GLUCOPHAGE) 500 MG tablet [id number]  TAKE 1 TABLET BY MOUTH 2 TIMES DAILY WITH A MEAL. [name] D, MD  Active   metroNIDAZOLE (METROGEL) 1 % gel [id number]  Apply topically daily.  Patient taking differently: Apply 1 application topically daily as needed (rosacea).   Libby Maw, MD  Active Self  Multiple Vitamin (MULTIVITAMIN) tablet [id number]  Take 1 tablet by mouth daily. [provider]  Active Self  naproxen sodium (ALEVE) 220 MG tablet [id number]  Take 220 mg by mouth daily as needed. [provider]  Active   omega-3 fish oil (MAXEPA) 1000 MG CAPS capsule [id number]  Take 1 capsule by mouth daily.  [provider]  Active Self  tamoxifen (NOLVADEX) 20 MG tablet [id number]  Take 20 mg by mouth daily. [provider]  Active   Turmeric 500 MG CAPS [id number]  Take 500 mg by mouth daily as needed (pain). [provider]  Active Self  Wheat Dextrin (BENEFIBER PO) [id number]  Take by mouth. [provider]  Active             Patient Active Problem List   Diagnosis Date Noted  Genetic testing 02/27/2021   Personal history of malignant neoplasm of uterus 02/14/2021   Family history of breast cancer 02/14/2021   Malignant neoplasm of upper-outer quadrant of right breast in female, estrogen receptor positive ([place]) 02/08/2021   Right knee pain 01/29/2021   Allergic rhinitis due to allergen 03/22/2020   OSA (obstructive sleep apnea) 03/22/2020   Left knee pain 01/18/2020   Other insomnia 01/18/2020   Apnea 01/18/2020   Class 3 severe obesity with serious comorbidity and body mass index (BMI) of 45.0 to 49.9 in adult [facility]) 01/18/2020   Musculoskeletal back pain 01/18/2020   Nocturia 10/12/2019   Prediabetes 05/10/2019   Glaucoma of both eyes 05/10/2019   Essential hypertension 05/10/2019   Dry scalp 05/10/2019   Osteoporosis 05/10/2019   Seborrheic keratosis 01/11/2019   Rosacea 01/11/2019   Family history of colon cancer 01/11/2019   Edema 08/27/2018   Primary osteoarthritis 08/27/2018   Sciatica of right side 08/27/2018   Depression, major, single episode, complete remission ([place]) 08/27/2018   Vitamin D deficiency 08/27/2018   Mixed hyperlipidemia 08/27/2018   Anxiety 08/27/2018   Hyperglycemia 08/27/2018    Immunization History  Administered Date(s) Administered   Fluad Quad(high Dose 65+) 05/25/2019, 05/22/2020   Influenza, High Dose Seasonal PF 06/16/2021   Influenza,inj,Quad PF,6+ Mos 05/12/2018   PFIZER Comirnaty(Gray Top)Covid-19 Tri-Sucrose Vaccine 02/06/2021   PFIZER(Purple Top)SARS-COV-2 Vaccination 10/03/2019, 10/26/2019, 06/16/2020   Zoster Recombinat (Shingrix) 05/01/2021, 08/03/2021    Conditions to be addressed/monitored:  Hypertension, Hyperlipidemia, Depression, Osteoporosis, Osteoarthritis, Allergic Rhinitis, and Prediabetes  Care Plan : General Pharmacy (Adult)  Updates made by Germaine Pomfret, RPH since 10/04/2021 12:00 AM     Problem: Hypertension, Hyperlipidemia, Depression, Osteoporosis, Osteoarthritis, Allergic  Rhinitis, and Prediabetes   Priority: High     Long-Range Goal: Patient-Specific Goal   Start Date: 04/02/2021  Expected End Date: 04/02/2022  This Visit's Progress: On track  Recent Progress: On track  Priority: High  Note:   Current Barriers:  No barriers noted  Pharmacist Clinical Goal(s):  Patient will maintain control of blood pressure as evidenced by BP less than 140/90  through collaboration with PharmD and provider.   Interventions: 1:1 collaboration with Libby Maw, MD regarding development and update of comprehensive plan of care as evidenced by provider attestation and co-signature Inter-disciplinary care team collaboration (see longitudinal plan of care) Comprehensive medication review performed; medication list updated in electronic medical record  Hypertension (BP goal <140/90) -Controlled -Current treatment: Chlorthalidone 25 mg daily: Appropriate, Effective, Safe, Accessible  -Medications previously tried: HCTZ  -Current home readings: NA -Current dietary habits: Trying to calorie restrict, watch simple carbohydrates.  -Current exercise habits: Silver sneakers twice weekly. A little bit of walking, but limited due to knee pain.  -Denies hypotensive/hypertensive symptoms -Recommended to continue current medication  Hyperlipidemia: (LDL goal < 100) -Uncontrolled -Current treatment: None -Medications previously tried: NA  -Counseled on diet and exercise extensively  Prediabetes (A1c goal <6.5%) -Controlled -Current medications: Metformin 500 mg twice daily: Appropriate, Effective, Safe, Accessible -Medications previously tried: NA  -reports symptoms: mild dizziness when she goes too long without eating breakfast.  -Recommended to continue current medication  Depression/Anxiety (Goal: Maintain stable mood) -Controlled -Current treatment: Wellbutrin SR 150 mg daily  -Medications previously tried/failed: NA -PHQ9: 0 -GAD7: 0 -Feels mood is  well controlled despite recent cancer diagnosis. Anxiety mostly related to claustrophobia. Wellbutrin has been beneficial at helping manage her food cravings.  -Recommended to continue current medication  Osteoporosis (Goal Prevent bone  fractures) -Controlled -Patient is a candidate for pharmacologic treatment due to T-Score < -2.5 in femoral neck -Current treatment  Alendronate 70 mg weekly Started Dec 2021 -Medications previously tried: NA  -Counseled on oral bisphosphonate administration: take in the morning, 30 minutes prior to food with 6-8 oz of water. Do not lie down for at least 30 minutes after taking. -Recommended to continue current medication  Patient Goals/Self-Care Activities Patient will:  - check blood pressure weekly, document, and provide at future appointments  Follow Up Plan: Telephone follow up appointment with care management team member scheduled for:  10/03/22 at 8:30 AM      Medication Assistance: None required.  Patient affirms current coverage meets needs.  Compliance/Adherence/Medication fill history: Care Gaps: Hepatitis C screening Tetanus Vaccine Shingrix Vaccine  PNA Vaccine  Star-Rating Drugs: Metformin 500 mg: LF 07/30/21 for 30-DS.   Patient's preferred pharmacy is:  [id number] [place] [id number]: [phone]: [phone] Uses pill box? Yes Pt endorses 100% compliance  We discussed: Current pharmacy is preferred with insurance plan and patient is satisfied with pharmacy services Patient decided to: Continue current medication management strategy  Care Plan and Follow Up Patient Decision:  Patient agrees to Care Plan and Follow-up.  Plan: Telephone follow up appointment with care management team member scheduled for:  10/03/22 at 8:30 AM  AJunius Argyle PharmD, BPara March CPP Clinical Pharmacist [facility] [phone]

## 2021-10-04 NOTE — Patient Instructions (Addendum)
Visit Information It was great speaking with you today!  Please let me know if you have any questions about our visit.   Goals Addressed             This Visit's Progress    Track and Manage My Blood Pressure-Hypertension       Timeframe:  Long-Range Goal Priority:  High Start Date: 04/02/2021                             Expected End Date: 04/02/2022                      Follow Up within 180 days   - check blood pressure weekly    Why is this important?   You won't feel high blood pressure, but it can still hurt your blood vessels.  High blood pressure can cause heart or kidney problems. It can also cause a stroke.  Making lifestyle changes like losing a little weight or eating less salt will help.  Checking your blood pressure at home and at different times of the day can help to control blood pressure.  If the doctor prescribes medicine remember to take it the way the doctor ordered.  Call the office if you cannot afford the medicine or if there are questions about it.     Notes:         Patient Care Plan: General Pharmacy (Adult)     Problem Identified: Hypertension, Hyperlipidemia, Depression, Osteoporosis, Osteoarthritis, Allergic Rhinitis, and Prediabetes   Priority: High     Long-Range Goal: Patient-Specific Goal   Start Date: 04/02/2021  Expected End Date: 04/02/2022  This Visit's Progress: On track  Recent Progress: On track  Priority: High  Note:   Current Barriers:  No barriers noted  Pharmacist Clinical Goal(s):  Patient will maintain control of blood pressure as evidenced by BP less than 140/90  through collaboration with PharmD and provider.   Interventions: 1:1 collaboration with Libby Maw, MD regarding development and update of comprehensive plan of care as evidenced by provider attestation and co-signature Inter-disciplinary care team collaboration (see longitudinal plan of care) Comprehensive medication review performed; medication  list updated in electronic medical record  Hypertension (BP goal <140/90) -Controlled -Current treatment: Chlorthalidone 25 mg daily: Appropriate, Effective, Safe, Accessible  -Medications previously tried: HCTZ  -Current home readings: NA -Current dietary habits: Trying to calorie restrict, watch simple carbohydrates.  -Current exercise habits: Silver sneakers twice weekly. A little bit of walking, but limited due to knee pain.  -Denies hypotensive/hypertensive symptoms -Recommended to continue current medication  Hyperlipidemia: (LDL goal < 100) -Uncontrolled -Current treatment: None -Medications previously tried: NA  -Counseled on diet and exercise extensively  Prediabetes (A1c goal <6.5%) -Controlled -Current medications: Metformin 500 mg twice daily: Appropriate, Effective, Safe, Accessible -Medications previously tried: NA  -reports symptoms: mild dizziness when she goes too long without eating breakfast.  -Recommended to continue current medication  Depression/Anxiety (Goal: Maintain stable mood) -Controlled -Current treatment: Wellbutrin SR 150 mg daily  -Medications previously tried/failed: NA -PHQ9: 0 -GAD7: 0 -Feels mood is well controlled despite recent cancer diagnosis. Anxiety mostly related to claustrophobia. Wellbutrin has been beneficial at helping manage her food cravings.  -Recommended to continue current medication  Osteoporosis (Goal Prevent bone fractures) -Controlled -Patient is a candidate for pharmacologic treatment due to T-Score < -2.5 in femoral neck -Current treatment  Alendronate 70 mg weekly Started Dec 2021 -  Medications previously tried: NA  -Counseled on oral bisphosphonate administration: take in the morning, 30 minutes prior to food with 6-8 oz of water. Do not lie down for at least 30 minutes after taking. -Recommended to continue current medication  Patient Goals/Self-Care Activities Patient will:  - check blood pressure weekly,  document, and provide at future appointments  Follow Up Plan: Telephone follow up appointment with care management team member scheduled for:  10/03/22 at 8:30 AM    Patient agreed to services and verbal consent obtained.   Patient verbalizes understanding of instructions and care plan provided today and agrees to view in [place]. Active MyChart status confirmed with patient.    Junius Argyle, PharmD, Para [place], CPP Clinical Pharmacist Practitioner  [facility]  [phone]

## 2021-10-09 ENCOUNTER — Ambulatory Visit (INDEPENDENT_AMBULATORY_CARE_PROVIDER_SITE_OTHER): Payer: Medicare HMO | Admitting: Family Medicine

## 2021-10-09 ENCOUNTER — Encounter (INDEPENDENT_AMBULATORY_CARE_PROVIDER_SITE_OTHER): Payer: Self-pay | Admitting: Family Medicine

## 2021-10-09 ENCOUNTER — Other Ambulatory Visit: Payer: Self-pay

## 2021-10-09 VITALS — BP 93/64 | HR 79 | Temp 97.8°F | Ht 63.0 in | Wt 251.0 lb

## 2021-10-09 DIAGNOSIS — I10 Essential (primary) hypertension: Secondary | ICD-10-CM

## 2021-10-09 DIAGNOSIS — R7303 Prediabetes: Secondary | ICD-10-CM

## 2021-10-09 DIAGNOSIS — F3289 Other specified depressive episodes: Secondary | ICD-10-CM

## 2021-10-09 DIAGNOSIS — Z6841 Body Mass Index (BMI) 40.0 and over, adult: Secondary | ICD-10-CM

## 2021-10-09 DIAGNOSIS — R69 Illness, unspecified: Secondary | ICD-10-CM | POA: Diagnosis not present

## 2021-10-09 DIAGNOSIS — E782 Mixed hyperlipidemia: Secondary | ICD-10-CM | POA: Diagnosis not present

## 2021-10-09 DIAGNOSIS — E669 Obesity, unspecified: Secondary | ICD-10-CM | POA: Diagnosis not present

## 2021-10-10 NOTE — Progress Notes (Signed)
Chief Complaint:   OBESITY Kristi Hunter is here to discuss her progress with her obesity treatment plan along with follow-up of her obesity related diagnoses. Kristi Hunter is on keeping a food journal and adhering to recommended goals of 1400-1600 calories and 95+ grams of protein daily and states she is following her eating plan approximately 60% of the time. Kristi Hunter states she is doing 0 minutes 0 times per week.  Today's visit was #: 23 Starting weight: 267 lbs Starting date: 02/22/2020 Today's weight: 251 lbs Today's date: 10/09/2021 Total lbs lost to date: 16 Total lbs lost since last in-office visit: 0  Interim History: Kristi Hunter has done well to maintain her weight since her last visit. She went to [facility] for 3 weeks since her last visit. She has not been journaling since her last visit. She moved last week into an independent living home. She is now eating 2-3 meals at her new residence. She is excited to live near a gym and pool.  Subjective:   1. Pre-diabetes Kristi Hunter's last insulin was elevated at 37.9 and A1c was 5.7. She is taking metformin 500 mg once daily. She forgets her second dose. She denies side effects. She notes some low blood sugar if eating late.   2. Essential hypertension Kristi Hunter's blood pressure is stable on her current medications. She denies chest pain, shortness of breath, or palpitations.   3. Other depression with emotional eating Kristi Hunter is taking Wellbutrin SR 150 mg daily, and she denies side effects.  Assessment/Plan:   1. Pre-diabetes Kristi Hunter will continue metformin, and will continue working on dietary changes, exercise, and weight loss. We will obtain labs at her next visit.   2. Essential hypertension Kristi Hunter will continue to follow up with her primary care physician. She will continue her medications as directed. We will watch for signs of hypotension as she continues her lifestyle modifications.  3. Other depression with  emotional eating Kristi Hunter will continue Wellbutrin SR 150 mg. Behavior modification techniques were discussed today to help Kristi Hunter deal with her emotional/non-hunger eating behaviors. Orders and follow up as documented in patient record.   4. Obesity with current BMI of 44.5 Kristi Hunter is currently in the action stage of change. As such, her goal is to continue with weight loss efforts. She has agreed to practicing portion control and making smarter food choices, such as increasing vegetables and decreasing simple carbohydrates.   Exercise goals: Plans to use the gym and pool at her new home.  Behavioral modification strategies: increasing lean protein intake, increasing water intake, and no skipping meals.  Kristi Hunter has agreed to follow-up with our clinic in 3 to 4 weeks. She was informed of the importance of frequent follow-up visits to maximize her success with intensive lifestyle modifications for her multiple health conditions.   Objective:   Blood pressure 93/64, pulse 79, temperature 97.8 F (36.6 C), height 5\' 3"  (1.6 m), weight 251 lb (113.9 kg), SpO2 96 %. Body mass index is 44.46 kg/m.  General: Cooperative, alert, well developed, in no acute distress. HEENT: Conjunctivae and lids unremarkable. Cardiovascular: Regular rhythm.  Lungs: Normal work of breathing. Neurologic: No focal deficits.   Lab Results  Component Value Date   CREATININE 0.74 05/02/2021   BUN 14 05/02/2021   NA 138 05/02/2021   K 3.6 05/02/2021   CL 99 05/02/2021   CO2 29 05/02/2021   Lab Results  Component Value Date   ALT 20 05/02/2021   AST 20 05/02/2021   ALKPHOS 74  05/02/2021   BILITOT 0.4 05/02/2021   Lab Results  Component Value Date   HGBA1C 5.7 (H) 04/12/2021   HGBA1C 5.8 (H) 02/19/2021   HGBA1C 5.9 (H) 02/22/2020   HGBA1C 5.9 05/10/2019   HGBA1C 6.0 01/13/2019   Lab Results  Component Value Date   INSULIN 37.9 (H) 04/12/2021   INSULIN 21.5 02/22/2020   Lab Results   Component Value Date   TSH 1.470 02/22/2020   Lab Results  Component Value Date   CHOL 169 04/12/2021   HDL 46 04/12/2021   LDLCALC 110 (H) 04/12/2021   TRIG 69 04/12/2021   CHOLHDL 3 01/13/2019   Lab Results  Component Value Date   VD25OH 46.8 04/12/2021   VD25OH 42.1 02/22/2020   Lab Results  Component Value Date   WBC 11.8 (H) 05/02/2021   HGB 12.7 05/02/2021   HCT 39.7 05/02/2021   MCV 85.9 05/02/2021   PLT 350 05/02/2021   No results found for: IRON, TIBC, FERRITIN  Attestation Statements:   Reviewed by clinician on day of visit: allergies, medications, problem list, medical history, surgical history, family history, social history, and previous encounter notes.  Time spent on visit including pre-visit chart review and post-visit care and charting was 30 minutes.    I, Trixie Dredge, am acting as transcriptionist for [name], MD.  I have reviewed the above documentation for accuracy and completeness, and I agree with the above. -  [name], MD

## 2021-10-11 ENCOUNTER — Encounter (INDEPENDENT_AMBULATORY_CARE_PROVIDER_SITE_OTHER): Payer: Self-pay

## 2021-10-25 DIAGNOSIS — R350 Frequency of micturition: Secondary | ICD-10-CM | POA: Diagnosis not present

## 2021-10-29 ENCOUNTER — Ambulatory Visit (INDEPENDENT_AMBULATORY_CARE_PROVIDER_SITE_OTHER): Payer: Medicare HMO | Admitting: Family Medicine

## 2021-11-08 ENCOUNTER — Ambulatory Visit (INDEPENDENT_AMBULATORY_CARE_PROVIDER_SITE_OTHER): Payer: Medicare HMO | Admitting: Family Medicine

## 2021-11-13 DIAGNOSIS — R350 Frequency of micturition: Secondary | ICD-10-CM | POA: Diagnosis not present

## 2021-11-15 ENCOUNTER — Ambulatory Visit (INDEPENDENT_AMBULATORY_CARE_PROVIDER_SITE_OTHER): Payer: Medicare HMO | Admitting: Family Medicine

## 2021-11-20 ENCOUNTER — Encounter (HOSPITAL_COMMUNITY): Payer: Self-pay

## 2021-11-21 ENCOUNTER — Encounter (INDEPENDENT_AMBULATORY_CARE_PROVIDER_SITE_OTHER): Payer: Self-pay | Admitting: Family Medicine

## 2021-11-22 ENCOUNTER — Ambulatory Visit (INDEPENDENT_AMBULATORY_CARE_PROVIDER_SITE_OTHER): Payer: Medicare HMO | Admitting: Family Medicine

## 2021-12-10 ENCOUNTER — Other Ambulatory Visit: Payer: Self-pay

## 2021-12-10 DIAGNOSIS — Z170 Estrogen receptor positive status [ER+]: Secondary | ICD-10-CM

## 2021-12-11 ENCOUNTER — Ambulatory Visit (INDEPENDENT_AMBULATORY_CARE_PROVIDER_SITE_OTHER): Payer: Medicare HMO | Admitting: Family Medicine

## 2021-12-11 ENCOUNTER — Inpatient Hospital Stay: Payer: Medicare HMO | Attending: Hematology and Oncology

## 2021-12-11 ENCOUNTER — Other Ambulatory Visit: Payer: Self-pay

## 2021-12-11 ENCOUNTER — Inpatient Hospital Stay (HOSPITAL_BASED_OUTPATIENT_CLINIC_OR_DEPARTMENT_OTHER): Payer: Medicare HMO | Admitting: Hematology and Oncology

## 2021-12-11 ENCOUNTER — Encounter: Payer: Self-pay | Admitting: Hematology and Oncology

## 2021-12-11 VITALS — BP 141/52 | HR 78 | Temp 97.9°F | Resp 18 | Ht 63.0 in | Wt 255.8 lb

## 2021-12-11 DIAGNOSIS — Z800 Family history of malignant neoplasm of digestive organs: Secondary | ICD-10-CM | POA: Diagnosis not present

## 2021-12-11 DIAGNOSIS — M810 Age-related osteoporosis without current pathological fracture: Secondary | ICD-10-CM | POA: Diagnosis not present

## 2021-12-11 DIAGNOSIS — Z170 Estrogen receptor positive status [ER+]: Secondary | ICD-10-CM | POA: Insufficient documentation

## 2021-12-11 DIAGNOSIS — C50411 Malignant neoplasm of upper-outer quadrant of right female breast: Secondary | ICD-10-CM

## 2021-12-11 DIAGNOSIS — Z90710 Acquired absence of both cervix and uterus: Secondary | ICD-10-CM | POA: Insufficient documentation

## 2021-12-11 DIAGNOSIS — N189 Chronic kidney disease, unspecified: Secondary | ICD-10-CM | POA: Diagnosis not present

## 2021-12-11 DIAGNOSIS — Z803 Family history of malignant neoplasm of breast: Secondary | ICD-10-CM | POA: Diagnosis not present

## 2021-12-11 DIAGNOSIS — R350 Frequency of micturition: Secondary | ICD-10-CM | POA: Diagnosis not present

## 2021-12-11 DIAGNOSIS — Z87891 Personal history of nicotine dependence: Secondary | ICD-10-CM | POA: Diagnosis not present

## 2021-12-11 DIAGNOSIS — I129 Hypertensive chronic kidney disease with stage 1 through stage 4 chronic kidney disease, or unspecified chronic kidney disease: Secondary | ICD-10-CM | POA: Insufficient documentation

## 2021-12-11 LAB — CMP (CANCER CENTER ONLY)
ALT: 17 U/L (ref 0–44)
AST: 19 U/L (ref 15–41)
Albumin: 3.5 g/dL (ref 3.5–5.0)
Alkaline Phosphatase: 42 U/L (ref 38–126)
Anion gap: 4 — ABNORMAL LOW (ref 5–15)
BUN: 21 mg/dL (ref 8–23)
CO2: 30 mmol/L (ref 22–32)
Calcium: 9 mg/dL (ref 8.9–10.3)
Chloride: 103 mmol/L (ref 98–111)
Creatinine: 0.76 mg/dL (ref 0.44–1.00)
GFR, Estimated: >60 mL/min (ref >60)
Glucose, Bld: 103 mg/dL — ABNORMAL HIGH (ref 70–99)
Potassium: 3.7 mmol/L (ref 3.5–5.1)
Sodium: 137 mmol/L (ref 135–145)
Total Bilirubin: 0.3 mg/dL (ref 0.3–1.2)
Total Protein: 6.6 g/dL (ref 6.5–8.1)

## 2021-12-11 LAB — CBC WITH DIFFERENTIAL (CANCER CENTER ONLY)
Abs Immature Granulocytes: 0.02 10*3/uL (ref 0.00–0.07)
Basophils Absolute: 0.1 10*3/uL (ref 0.0–0.1)
Basophils Relative: 1 %
Eosinophils Absolute: 0.2 10*3/uL (ref 0.0–0.5)
Eosinophils Relative: 3 %
HCT: 38.9 % (ref 36.0–46.0)
Hemoglobin: 12.4 g/dL (ref 12.0–15.0)
Immature Granulocytes: 0 %
Lymphocytes Relative: 35 %
Lymphs Abs: 2.8 10*3/uL (ref 0.7–4.0)
MCH: 27.2 pg (ref 26.0–34.0)
MCHC: 31.9 g/dL (ref 30.0–36.0)
MCV: 85.3 fL (ref 80.0–100.0)
Monocytes Absolute: 0.7 10*3/uL (ref 0.1–1.0)
Monocytes Relative: 8 %
Neutro Abs: 4.2 10*3/uL (ref 1.7–7.7)
Neutrophils Relative %: 53 %
Platelet Count: 285 10*3/uL (ref 150–400)
RBC: 4.56 MIL/uL (ref 3.87–5.11)
RDW: 13.3 % (ref 11.5–15.5)
WBC Count: 7.9 10*3/uL (ref 4.0–10.5)
nRBC: 0 % (ref 0.0–0.2)

## 2021-12-11 NOTE — Progress Notes (Signed)
SURVIVORSHIP VISIT: ? ? ?BRIEF ONCOLOGIC HISTORY:  ?Oncology History  ?Malignant neoplasm of upper-outer quadrant of right breast in female, estrogen receptor positive ([place])  ?02/01/2021 Imaging  ? EXAM: ?ULTRASOUND OF THE RIGHT BREAST ? ?FINDINGS: ?Targeted ultrasound is performed, showing a 10 x 7 x 8 mm irregular hypoechoic mass right breast 10 o'clock position 1 cm from the nipple. No right axillary adenopathy. ?  ?IMPRESSION: ?Suspicious right breast mass 10 o'clock position. ?  ?02/05/2021 Initial Biopsy  ? Diagnosis ?Breast, right, needle core biopsy, 10 o'clock, 1cmfn ?- INVASIVE DUCTAL CARCINOMA WITH CALCIFICATIONS. SEE NOTE ?- DUCTAL CARCINOMA IN SITU, INTERMEDIATE GRADE ?Diagnosis Note ?Carcinoma measures 0.7 cm in greatest linear dimension and appears grade 2. ? ?PROGNOSTIC INDICATORS ?Results: ?The tumor cells are EQUIVOCAL for Her2 (2+). Her2 by FISH will be performed and results reported separately. ?Estrogen Receptor: 100%, POSITIVE, STRONG STAINING INTENSITY ?Progesterone Receptor: 90%, POSITIVE, STRONG STAINING INTENSITY ?Proliferation Marker Ki67: 20% ? ?FLUORESCENCE IN-SITU HYBRIDIZATION ?Results: ?GROUP 5: HER2 **NEGATIVE** ?  ?02/08/2021 Initial Diagnosis  ? Malignant neoplasm of upper-outer quadrant of right breast in female, estrogen receptor positive ([place]) ?  ?02/14/2021 Cancer Staging  ? Staging form: Breast, AJCC 8th Edition ?- Clinical stage from 02/14/2021: Stage IA (cT1b, cN0, cM0, G2, ER+, PR+, HER2-) - Signed by Chauncey Cruel, MD on 02/14/2021 ?Stage prefix: Initial diagnosis ?Histologic grading system: 3 grade system ?  ?02/20/2021 Cancer Staging  ? Staging form: Breast, AJCC 8th Edition ?- Pathologic stage from 02/20/2021: Stage IA (pT1c, pN0, cM0, G2, ER+, PR+, HER2-) - Signed by Gardenia Phlegm, NP on 08/07/2021 ?Histologic grading system: 3 grade system ?  ?02/20/2021 Definitive Surgery  ? FINAL MICROSCOPIC DIAGNOSIS:  ? ?A. BREAST, RIGHT, LUMPECTOMY:  ?- Invasive and in situ  ductal carcinoma, 1.2 cm.  ?- Invasive carcinoma focally 0.1 cm from medial margin and 0.2 cm from anterior margin.  ?- Biopsy site and biopsy clip.  ?- See oncology table.  ?  ?02/20/2021 Oncotype testing  ? Oncotype DX was obtained on the final surgical sample and the recurrence score of 8 predicts a risk of recurrence outside the breast over the next 9 years of 3%, if the patient's only systemic therapy is an antiestrogen for 5 years.  It also predicts no benefit from chemotherapy. ? ?  ?02/23/2021 Genetic Testing  ? Negative hereditary cancer genetic testing: no pathogenic variants detected in [facility] CustomNext-Cancer +RNAinsight Panel.  The report date is February 23, 2021.   ? ?The CustomNext-Cancer +RNAinsight Panel offered by [facility] and includes sequencing and rearrangement analysis for the following 91 genes: AIP, ALK, APC, ATM, AXIN2, BAP1, BARD1, BLM, BMPR1A, BRCA1, BRCA2, BRIP1, CDC73, CDH1, CDK4, CDKN1B, CDKN2A, CHEK2, CTNNA1, DICER1, FANCC, FH, FLCN, GALNT12, KIF1B, LZTR1, MAX, MEN1, MET, MLH1, MRE11A, MSH2, MSH3, MSH6, MUTYH, NBN, NF1, NF2, NTHL1, PALB2, PHOX2B, PMS2, POT1, PRKAR1A, PTCH1, PTEN, RAD50, RAD51C, RAD51D, RB1, RECQL, RET, SDHA, SDHAF2, SDHB, SDHC, SDHD, SMAD4, SMARCA4, SMARCB1, SMARCE1, STK11, SUFU, TMEM127, TP53, TSC1, TSC2, VHL and XRCC2 (sequencing and deletion/duplication); CASR, CFTR, CPA1, CTRC, EGFR, EGLN1, FAM175A, HOXB13, KIT, MITF, MLH3, PALLD, PDGFRA, POLD1, POLE, PRSS1, RINT1, RPS20, SPINK1 and TERT (sequencing only); EPCAM and GREM1 (deletion/duplication only). RNA data is routinely analyzed for use in variant interpretation for all genes. ?  ?05/02/2021 - 04/2026 Anti-estrogen oral therapy  ? tamoxifen started 05/02/2021 ?            (A) s/p remote hysterectomy (for uterine cancer) ?            (  B) osteoporosis with bone density 2020 T score -2.6 ?  ? ? ?INTERVAL HISTORY:  ?Kristi Hunter is here for follow-up.  She has severe claustrophobia hence was trying to research to find a  mammography place in town where she does not have to take an Media planner.  She is otherwise tolerating tamoxifen very well.  She thinks she has mild constipation from it, eats 2 prunes every day.  She has been checking her breasts every month and has not noticed any changes.  She is due for mammogram in June 2023.  She is also taking Fosamax weekly for osteoporosis, due for bone density. ?Rest of the pertinent 10 point ROS reviewed and negative ? ?REVIEW OF SYSTEMS:  ?Review of Systems  ?Constitutional:  Negative for appetite change, chills, fatigue, fever and unexpected weight change.  ?HENT:   Negative for hearing loss, lump/mass, mouth sores and trouble swallowing.   ?Eyes:  Negative for eye problems and icterus.  ?Respiratory:  Negative for chest tightness, cough and shortness of breath.   ?Cardiovascular:  Negative for chest pain, leg swelling and palpitations.  ?Gastrointestinal:  Positive for constipation. Negative for abdominal distention, abdominal pain, diarrhea, nausea and vomiting.  ?Endocrine: Negative for hot flashes.  ?Genitourinary:  Negative for difficulty urinating.   ?Musculoskeletal:  Negative for arthralgias.  ?Skin:  Negative for itching and rash.  ?Neurological:  Negative for dizziness, extremity weakness, headaches and numbness.  ?Hematological:  Negative for adenopathy. Does not bruise/bleed easily.  ?Psychiatric/Behavioral:  Negative for depression. The patient is not nervous/anxious.   ?Breast: Denies any new nodularity, masses, tenderness, nipple changes, or nipple discharge.  ? ? ? ? ?ONCOLOGY TREATMENT TEAM:  ?1. Surgeon:  Dr. Barry Dienes at [facility] ?2. Medical Oncologist: Dr. Chryl Heck  ?3. Radiation Oncologist: Dr. Lisbeth Renshaw ?  ? ?PAST MEDICAL/SURGICAL HISTORY:  ?Past Medical History:  ?Diagnosis Date  ? Anemia   ? Anxiety   ? Arthritis   ? Back pain   ? Breast cancer ([place])   ? Cancer [facility])   ? uterine  ? Chest pain   ? Chicken pox   ? Chronic kidney disease   ? Constipation   ?  Emphysema of lung ([place])   ? Family history of breast cancer 02/14/2021  ? Glaucoma   ? Hypertension   ? Joint pain   ? Lower extremity edema   ? Lymphedema   ? Osteoarthritis   ? Personal history of malignant neoplasm of uterus 02/14/2021  ? Pre-diabetes   ? Prediabetes   ? Shortness of breath   ? Sleep apnea   ? Urine incontinence   ? Uterine cancer ([place])   ? UTI (urinary tract infection)   ? Vertigo   ? Vitamin B 12 deficiency   ? during pregnancy  ? ?Past Surgical History:  ?Procedure Laterality Date  ? ABDOMINAL HYSTERECTOMY  2011  ? BREAST LUMPECTOMY WITH RADIOACTIVE SEED LOCALIZATION Right 02/20/2021  ? Procedure: RIGHT BREAST LUMPECTOMY WITH RADIOACTIVE SEED LOCALIZATION;  Surgeon: Stark Klein, MD;  Location: [place];  Service: General;  Laterality: Right;  ? REPLACEMENT TOTAL KNEE Right 2012  ? ? ? ?ALLERGIES:  ?Allergies  ?Allergen Reactions  ? Percocet [Oxycodone-Acetaminophen] Other (See Comments)  ?  hallucinations  ? ? ? ?CURRENT MEDICATIONS:  ?Outpatient Encounter Medications as of 12/11/2021  ?Medication Sig  ? alendronate (FOSAMAX) 70 MG tablet Take 1 tablet (70 mg total) by mouth every 7 (seven) days. Take with a full glass of water on an empty stomach.  ?  aspirin 81 MG EC tablet Take 81 mg by mouth daily. Swallow whole.  ? buPROPion (WELLBUTRIN SR) 150 MG 12 hr tablet Take 1 tablet (150 mg total) by mouth in the morning.  ? calcium carbonate (OS-CAL) 600 MG TABS tablet Take 1 tablet (600 mg total) by mouth 2 (two) times daily with a meal.  ? chlorthalidone (HYGROTON) 25 MG tablet Take 1 tablet (25 mg total) by mouth daily.  ? diclofenac Sodium (VOLTAREN) 1 % GEL Apply 2 g topically daily as needed (Osteoarthritis).  ? ketoconazole (NIZORAL) 2 % cream APPLY 1 APPLICATION TOPICALLY DAILY  ? Lactobacillus (PROBIOTIC ACIDOPHILUS PO) Take 1 capsule by mouth daily.  ? Melatonin 10 MG TABS Take 10 mg by mouth at bedtime as needed (sleep).  ? metFORMIN (GLUCOPHAGE) 500 MG tablet TAKE 1 TABLET BY MOUTH 2 TIMES  DAILY WITH A MEAL.  ? metroNIDAZOLE (METROGEL) 1 % gel Apply topically daily. (Patient taking differently: Apply 1 application topically daily as needed (rosacea).)  ? Multiple Vitamin (MULTIVITAMIN) tab

## 2021-12-12 ENCOUNTER — Telehealth: Payer: Self-pay | Admitting: *Deleted

## 2021-12-12 DIAGNOSIS — M810 Age-related osteoporosis without current pathological fracture: Secondary | ICD-10-CM | POA: Diagnosis not present

## 2021-12-12 DIAGNOSIS — L409 Psoriasis, unspecified: Secondary | ICD-10-CM | POA: Diagnosis not present

## 2021-12-12 DIAGNOSIS — H409 Unspecified glaucoma: Secondary | ICD-10-CM | POA: Diagnosis not present

## 2021-12-12 DIAGNOSIS — Z008 Encounter for other general examination: Secondary | ICD-10-CM | POA: Diagnosis not present

## 2021-12-12 DIAGNOSIS — I10 Essential (primary) hypertension: Secondary | ICD-10-CM | POA: Diagnosis not present

## 2021-12-12 DIAGNOSIS — R69 Illness, unspecified: Secondary | ICD-10-CM | POA: Diagnosis not present

## 2021-12-12 DIAGNOSIS — R32 Unspecified urinary incontinence: Secondary | ICD-10-CM | POA: Diagnosis not present

## 2021-12-12 DIAGNOSIS — M1990 Unspecified osteoarthritis, unspecified site: Secondary | ICD-10-CM | POA: Diagnosis not present

## 2021-12-12 DIAGNOSIS — E1151 Type 2 diabetes mellitus with diabetic peripheral angiopathy without gangrene: Secondary | ICD-10-CM | POA: Diagnosis not present

## 2021-12-12 DIAGNOSIS — Z6841 Body Mass Index (BMI) 40.0 and over, adult: Secondary | ICD-10-CM | POA: Diagnosis not present

## 2021-12-12 DIAGNOSIS — C50919 Malignant neoplasm of unspecified site of unspecified female breast: Secondary | ICD-10-CM | POA: Diagnosis not present

## 2021-12-12 DIAGNOSIS — R600 Localized edema: Secondary | ICD-10-CM | POA: Diagnosis not present

## 2021-12-25 ENCOUNTER — Ambulatory Visit (INDEPENDENT_AMBULATORY_CARE_PROVIDER_SITE_OTHER): Payer: Medicare HMO | Admitting: Family Medicine

## 2021-12-25 ENCOUNTER — Encounter (INDEPENDENT_AMBULATORY_CARE_PROVIDER_SITE_OTHER): Payer: Self-pay | Admitting: Family Medicine

## 2021-12-25 VITALS — BP 123/76 | HR 84 | Temp 97.4°F | Ht 63.0 in | Wt 250.0 lb

## 2021-12-25 DIAGNOSIS — R69 Illness, unspecified: Secondary | ICD-10-CM | POA: Diagnosis not present

## 2021-12-25 DIAGNOSIS — Z6841 Body Mass Index (BMI) 40.0 and over, adult: Secondary | ICD-10-CM | POA: Diagnosis not present

## 2021-12-25 DIAGNOSIS — F3289 Other specified depressive episodes: Secondary | ICD-10-CM | POA: Diagnosis not present

## 2021-12-25 DIAGNOSIS — E559 Vitamin D deficiency, unspecified: Secondary | ICD-10-CM

## 2021-12-25 DIAGNOSIS — E669 Obesity, unspecified: Secondary | ICD-10-CM

## 2021-12-25 DIAGNOSIS — R7303 Prediabetes: Secondary | ICD-10-CM

## 2021-12-25 MED ORDER — BUPROPION HCL ER (SR) 150 MG PO TB12: 150.0000 mg | ORAL_TABLET | Freq: Every morning | ORAL | 0 refills | Status: DC

## 2021-12-25 MED ORDER — METFORMIN HCL 500 MG PO TABS: 500.0000 mg | ORAL_TABLET | Freq: Two times a day (BID) | ORAL | 0 refills | Status: DC

## 2021-12-26 LAB — VITAMIN D 25 HYDROXY (VIT D DEFICIENCY, FRACTURES): Vit D, 25-Hydroxy: 35.1 ng/mL (ref 30.0–100.0)

## 2021-12-26 LAB — HEMOGLOBIN A1C
Est. average glucose Bld gHb Est-mCnc: 120 mg/dL
Hgb A1c MFr Bld: 5.8 % — ABNORMAL HIGH (ref 4.8–5.6)

## 2021-12-31 ENCOUNTER — Ambulatory Visit: Payer: Medicare HMO | Admitting: Family Medicine

## 2021-12-31 ENCOUNTER — Ambulatory Visit: Payer: Self-pay

## 2021-12-31 ENCOUNTER — Encounter: Payer: Self-pay | Admitting: Family Medicine

## 2021-12-31 VITALS — BP 106/66 | HR 91 | Ht 63.0 in | Wt 257.6 lb

## 2021-12-31 DIAGNOSIS — M1712 Unilateral primary osteoarthritis, left knee: Secondary | ICD-10-CM

## 2021-12-31 DIAGNOSIS — G8929 Other chronic pain: Secondary | ICD-10-CM | POA: Diagnosis not present

## 2021-12-31 DIAGNOSIS — M25562 Pain in left knee: Secondary | ICD-10-CM

## 2021-12-31 DIAGNOSIS — M25561 Pain in right knee: Secondary | ICD-10-CM

## 2021-12-31 NOTE — Patient Instructions (Addendum)
Good to see you today.  You had a L knee steroid injection.  Call or go to the ER if you develop a large red swollen joint with extreme pain or oozing puss.   I've referred you to Physical Therapy.  Their office will call you to schedule but please let us know if you don't hear from them in one week regarding scheduling.  Follow-up: 6 weeks

## 2021-12-31 NOTE — Progress Notes (Signed)
I, Kristi Hunter, LAT, ATC acting as a scribe for [name], MD.  Kristi Hunter is a 81 y.o. female who presents to [place] at [facility] today for continued bilat knee pain, L>R. Pt was last seen by Dr. Georgina Snell on 09/27/21 and completed the Orthovisc series, 3/3, in her L knee. Today, pt reports that her knees con't to bother her.  She had a R knee TKR previously.  She states that she feels like the cortisone injections help better than the gel injections.  Her pain has increased over the past 2 months.  Her biggest issue is transitioning from sit-to-stand.  She also con't to note a pocket of fluid at her L post knee that she feels is inhibiting her L knee flexion AROM.  She notes that she cannot flex her knee to 90 degrees which makes standing from a seated position challenging.  In addition to the injection as above she would like to do physical therapy.  She lives at [place].  Dx imaging: 01/25/21 L knee XR  06/07/19 L knee XR  Pertinent review of systems: No fevers or chills  Relevant historical information: Osteoporosis.  Obesity BMI 45.   Exam:  BP 106/66 (BP Location: Left Arm, Patient Position: Sitting, Cuff Size: Large)   Pulse 91   Ht '5\' 3"'$  (1.6 m)   Wt 257 lb 9.6 oz (116.8 kg)   SpO2 94%   BMI 45.63 kg/m  General: Well Developed, well nourished, and in no acute distress.   MSK: Left knee: No effusion.  Range of motion 0-80 degrees.  Tender palpation posterior medial knee.  Stable ligamentous exam.    Lab and Radiology Results  Procedure: Real-time Ultrasound Guided Injection of left knee superior lateral patellar space Device: Philips Affiniti 50G Images permanently stored and available for review in PACS Ultrasound evaluation prior to injection reveals no Baker's cyst.  Narrowed medial joint line. Verbal informed consent obtained.  Discussed risks and benefits of procedure. Warned about infection, bleeding, hyperglycemia damage to  structures among others. Patient expresses understanding and agreement Time-out conducted.   Noted no overlying erythema, induration, or other signs of local infection.   Skin prepped in a sterile fashion.   Local anesthesia: Topical Ethyl chloride.   With sterile technique and under real time ultrasound guidance: 40 mg of Kenalog and 2 mL of Marcaine injected into knee joint. Fluid seen entering the joint capsule.   Completed without difficulty   Pain immediately resolved suggesting accurate placement of the medication.   Advised to call if fevers/chills, erythema, induration, drainage, or persistent bleeding.   Images permanently stored and available for review in the ultrasound unit.  Impression: Technically successful ultrasound guided injection.        Assessment and Plan: 81 y.o. female with left knee pain thought to be due to DJD exacerbation.  Plan for steroid injection today.  Additionally plan for physical therapy.  She would benefit from PT especially to regain on more knee flexion to make standing a little easier.  Weight loss additionally would help.  Recheck in 6 weeks.   PDMP not reviewed this encounter. Orders Placed This Encounter  Procedures   [name] LIMITED JOINT SPACE STRUCTURES LOW BILAT(NO LINKED CHARGES)    Order Specific Question:   Reason for Exam (SYMPTOM  OR DIAGNOSIS REQUIRED)    Answer:   B knee pain    Order Specific Question:   Preferred imaging location?    Answer:   [facility]  [place]   Ambulatory referral to Physical Therapy    Referral Priority:   Routine    Referral Type:   Physical Medicine    Referral Reason:   Specialty Services Required    Requested Specialty:   Physical Therapy    Number of Visits Requested:   1   No orders of the defined types were placed in this encounter.    Discussed warning signs or symptoms. Please see discharge instructions. Patient expresses understanding.   The above documentation has  been reviewed and is accurate and complete [name], M.D.

## 2022-01-08 DIAGNOSIS — R350 Frequency of micturition: Secondary | ICD-10-CM | POA: Diagnosis not present

## 2022-01-08 NOTE — Progress Notes (Unsigned)
Chief Complaint:   OBESITY Kristi Hunter is here to discuss her progress with her obesity treatment plan along with follow-up of her obesity related diagnoses. Kristi Hunter is on practicing portion control and making smarter food choices, such as increasing vegetables and decreasing simple carbohydrates and states she is following her eating plan approximately 80% of the time. Kristi Hunter states she is doing pool exercise for for 30-45 minutes 3 times per week.  Today's visit was #: 24 Starting weight: 267 lbs Starting date: 02/22/2020 Today's weight: 250 lbs Today's date: 12/25/2021 Total lbs lost to date: 17 Total lbs lost since last in-office visit: 1  Interim History: Kristi Hunter has increased her exercise and she is trying to do more strengthening and cardio. She is working on increasing vegetables and decreasing salad dressing. She is trying to lose weight to have a knee replacement surgery.    Subjective:   1. Vitamin D deficiency Kristi Hunter is on Vitamin D, and she is due for labs. She denies nausea or vomiting.   2. Pre-diabetes Kristi Hunter continues to work on her diet and weight loss. No side effects were noted.   3. Other depression with emotional eating Kristi Hunter is stable on her medications. She continues to try to decrease her emotional eating behaviors.   Assessment/Plan:   1. Vitamin D deficiency We will check labs today. Kristi Hunter will continue Vitamin D supplement, and she will follow-up for routine testing of Vitamin D, at least 2-3 times per year to avoid over-replacement.  - VITAMIN D 25 Hydroxy (Vit-D Deficiency, Fractures)  2. Pre-diabetes We will check labs today. We will refill metformin for 90 days with no refills. Kristi Hunter will continue her diet and exercise.  - Hemoglobin A1c - metFORMIN (GLUCOPHAGE) 500 MG tablet; Take 1 tablet (500 mg total) by mouth 2 (two) times daily with a meal.  Dispense: 180 tablet; Refill: 0  3. Other depression with emotional  eating Kristi Hunter will continue Wellbutrin SR 150 mg and we will refill for 90 days, with no refills. Behavior modification techniques were discussed today to help Kristi Hunter deal with her emotional/non-hunger eating behaviors.  Orders and follow up as documented in patient record.   - buPROPion (WELLBUTRIN SR) 150 MG 12 hr tablet; Take 1 tablet (150 mg total) by mouth in the morning.  Dispense: 90 tablet; Refill: 0  4. Obesity, Current BMI 44.3 Kristi Hunter is currently in the action stage of change. As such, her goal is to continue with weight loss efforts. She has agreed to practicing portion control and making smarter food choices, such as increasing vegetables and decreasing simple carbohydrates.   Exercise goals: As is.   Behavioral modification strategies: increasing lean protein intake and meal planning and cooking strategies.  Kristi Hunter has agreed to follow-up with our clinic in 3 months. She was informed of the importance of frequent follow-up visits to maximize her success with intensive lifestyle modifications for her multiple health conditions.   Kristi Hunter was informed we would discuss her lab results at her next visit unless there is a critical issue that needs to be addressed sooner. Kristi Hunter agreed to keep her next visit at the agreed upon time to discuss these results.  Objective:   Blood pressure 123/76, pulse 84, temperature (!) 97.4 F (36.3 C), height '5\' 3"'$  (1.6 m), weight 250 lb (113.4 kg), SpO2 97 %. Body mass index is 44.29 kg/m.  General: Cooperative, alert, well developed, in no acute distress. HEENT: Conjunctivae and lids unremarkable. Cardiovascular: Regular rhythm.  Lungs: Normal  work of breathing. Neurologic: No focal deficits.   Lab Results  Component Value Date   CREATININE 0.76 12/11/2021   BUN 21 12/11/2021   NA 137 12/11/2021   K 3.7 12/11/2021   CL 103 12/11/2021   CO2 30 12/11/2021   Lab Results  Component Value Date   ALT 17 12/11/2021   AST  19 12/11/2021   ALKPHOS 42 12/11/2021   BILITOT 0.3 12/11/2021   Lab Results  Component Value Date   HGBA1C 5.8 (H) 12/25/2021   HGBA1C 5.7 (H) 04/12/2021   HGBA1C 5.8 (H) 02/19/2021   HGBA1C 5.9 (H) 02/22/2020   HGBA1C 5.9 05/10/2019   Lab Results  Component Value Date   INSULIN 37.9 (H) 04/12/2021   INSULIN 21.5 02/22/2020   Lab Results  Component Value Date   TSH 1.470 02/22/2020   Lab Results  Component Value Date   CHOL 169 04/12/2021   HDL 46 04/12/2021   LDLCALC 110 (H) 04/12/2021   TRIG 69 04/12/2021   CHOLHDL 3 01/13/2019   Lab Results  Component Value Date   VD25OH 35.1 12/25/2021   VD25OH 46.8 04/12/2021   VD25OH 42.1 02/22/2020   Lab Results  Component Value Date   WBC 7.9 12/11/2021   HGB 12.4 12/11/2021   HCT 38.9 12/11/2021   MCV 85.3 12/11/2021   PLT 285 12/11/2021   No results found for: IRON, TIBC, FERRITIN  Obesity Behavioral Intervention:   Approximately 15 minutes were spent on the discussion below.  ASK: We discussed the diagnosis of obesity with Kristi Hunter today and Kristi Hunter agreed to give Kristi Hunter permission to discuss obesity behavioral modification therapy today.  ASSESS: [name] has the diagnosis of obesity and her BMI today is 44.3. [name] is in the action stage of change.   ADVISE: Kristi Hunter was educated on the multiple health risks of obesity as well as the benefit of weight loss to improve her health. She was advised of the need for long term treatment and the importance of lifestyle modifications to improve her current health and to decrease her risk of future health problems.  AGREE: Multiple dietary modification options and treatment options were discussed and Kristi Hunter agreed to follow the recommendations documented in the above note.  ARRANGE: Kristi Hunter was educated on the importance of frequent visits to treat obesity as outlined per CMS and USPSTF guidelines and agreed to schedule her next follow up appointment  today.  Attestation Statements:   Reviewed by clinician on day of visit: allergies, medications, problem list, medical history, surgical history, family history, social history, and previous encounter notes.   I, Trixie Dredge, am acting as transcriptionist for Kristi Nip, MD.  I have reviewed the above documentation for accuracy and completeness, and I agree with the above. -  Kristi Nip, MD

## 2022-01-14 DIAGNOSIS — M810 Age-related osteoporosis without current pathological fracture: Secondary | ICD-10-CM | POA: Diagnosis not present

## 2022-01-14 DIAGNOSIS — Z170 Estrogen receptor positive status [ER+]: Secondary | ICD-10-CM | POA: Diagnosis not present

## 2022-01-14 DIAGNOSIS — C50411 Malignant neoplasm of upper-outer quadrant of right female breast: Secondary | ICD-10-CM | POA: Diagnosis not present

## 2022-01-17 DIAGNOSIS — M25551 Pain in right hip: Secondary | ICD-10-CM | POA: Diagnosis not present

## 2022-01-17 DIAGNOSIS — M6281 Muscle weakness (generalized): Secondary | ICD-10-CM | POA: Diagnosis not present

## 2022-01-17 DIAGNOSIS — R2689 Other abnormalities of gait and mobility: Secondary | ICD-10-CM | POA: Diagnosis not present

## 2022-01-17 DIAGNOSIS — M25562 Pain in left knee: Secondary | ICD-10-CM | POA: Diagnosis not present

## 2022-01-17 DIAGNOSIS — R2681 Unsteadiness on feet: Secondary | ICD-10-CM | POA: Diagnosis not present

## 2022-01-21 DIAGNOSIS — M6281 Muscle weakness (generalized): Secondary | ICD-10-CM | POA: Diagnosis not present

## 2022-01-21 DIAGNOSIS — M25551 Pain in right hip: Secondary | ICD-10-CM | POA: Diagnosis not present

## 2022-01-21 DIAGNOSIS — R2681 Unsteadiness on feet: Secondary | ICD-10-CM | POA: Diagnosis not present

## 2022-01-21 DIAGNOSIS — M25562 Pain in left knee: Secondary | ICD-10-CM | POA: Diagnosis not present

## 2022-01-21 DIAGNOSIS — R2689 Other abnormalities of gait and mobility: Secondary | ICD-10-CM | POA: Diagnosis not present

## 2022-01-24 DIAGNOSIS — M25562 Pain in left knee: Secondary | ICD-10-CM | POA: Diagnosis not present

## 2022-01-24 DIAGNOSIS — M25551 Pain in right hip: Secondary | ICD-10-CM | POA: Diagnosis not present

## 2022-01-24 DIAGNOSIS — M6281 Muscle weakness (generalized): Secondary | ICD-10-CM | POA: Diagnosis not present

## 2022-01-24 DIAGNOSIS — R2681 Unsteadiness on feet: Secondary | ICD-10-CM | POA: Diagnosis not present

## 2022-01-24 DIAGNOSIS — R2689 Other abnormalities of gait and mobility: Secondary | ICD-10-CM | POA: Diagnosis not present

## 2022-01-29 DIAGNOSIS — R69 Illness, unspecified: Secondary | ICD-10-CM | POA: Diagnosis not present

## 2022-01-29 DIAGNOSIS — M25562 Pain in left knee: Secondary | ICD-10-CM | POA: Diagnosis not present

## 2022-01-29 DIAGNOSIS — M6281 Muscle weakness (generalized): Secondary | ICD-10-CM | POA: Diagnosis not present

## 2022-01-29 DIAGNOSIS — F321 Major depressive disorder, single episode, moderate: Secondary | ICD-10-CM | POA: Diagnosis not present

## 2022-01-29 DIAGNOSIS — R2689 Other abnormalities of gait and mobility: Secondary | ICD-10-CM | POA: Diagnosis not present

## 2022-01-29 DIAGNOSIS — M25551 Pain in right hip: Secondary | ICD-10-CM | POA: Diagnosis not present

## 2022-01-29 DIAGNOSIS — R2681 Unsteadiness on feet: Secondary | ICD-10-CM | POA: Diagnosis not present

## 2022-01-29 DIAGNOSIS — F4389 Other reactions to severe stress: Secondary | ICD-10-CM | POA: Diagnosis not present

## 2022-01-31 DIAGNOSIS — M25562 Pain in left knee: Secondary | ICD-10-CM | POA: Diagnosis not present

## 2022-01-31 DIAGNOSIS — R2689 Other abnormalities of gait and mobility: Secondary | ICD-10-CM | POA: Diagnosis not present

## 2022-01-31 DIAGNOSIS — M6281 Muscle weakness (generalized): Secondary | ICD-10-CM | POA: Diagnosis not present

## 2022-01-31 DIAGNOSIS — M25551 Pain in right hip: Secondary | ICD-10-CM | POA: Diagnosis not present

## 2022-01-31 DIAGNOSIS — R2681 Unsteadiness on feet: Secondary | ICD-10-CM | POA: Diagnosis not present

## 2022-02-01 ENCOUNTER — Other Ambulatory Visit: Payer: Self-pay | Admitting: Family Medicine

## 2022-02-01 DIAGNOSIS — I10 Essential (primary) hypertension: Secondary | ICD-10-CM

## 2022-02-05 DIAGNOSIS — F321 Major depressive disorder, single episode, moderate: Secondary | ICD-10-CM | POA: Diagnosis not present

## 2022-02-05 DIAGNOSIS — M6281 Muscle weakness (generalized): Secondary | ICD-10-CM | POA: Diagnosis not present

## 2022-02-05 DIAGNOSIS — R2689 Other abnormalities of gait and mobility: Secondary | ICD-10-CM | POA: Diagnosis not present

## 2022-02-05 DIAGNOSIS — R69 Illness, unspecified: Secondary | ICD-10-CM | POA: Diagnosis not present

## 2022-02-05 DIAGNOSIS — M25551 Pain in right hip: Secondary | ICD-10-CM | POA: Diagnosis not present

## 2022-02-05 DIAGNOSIS — R2681 Unsteadiness on feet: Secondary | ICD-10-CM | POA: Diagnosis not present

## 2022-02-05 DIAGNOSIS — R350 Frequency of micturition: Secondary | ICD-10-CM | POA: Diagnosis not present

## 2022-02-05 DIAGNOSIS — M25562 Pain in left knee: Secondary | ICD-10-CM | POA: Diagnosis not present

## 2022-02-05 DIAGNOSIS — F4389 Other reactions to severe stress: Secondary | ICD-10-CM | POA: Diagnosis not present

## 2022-02-07 DIAGNOSIS — M25551 Pain in right hip: Secondary | ICD-10-CM | POA: Diagnosis not present

## 2022-02-07 DIAGNOSIS — R2689 Other abnormalities of gait and mobility: Secondary | ICD-10-CM | POA: Diagnosis not present

## 2022-02-07 DIAGNOSIS — M25562 Pain in left knee: Secondary | ICD-10-CM | POA: Diagnosis not present

## 2022-02-07 DIAGNOSIS — R2681 Unsteadiness on feet: Secondary | ICD-10-CM | POA: Diagnosis not present

## 2022-02-07 DIAGNOSIS — M6281 Muscle weakness (generalized): Secondary | ICD-10-CM | POA: Diagnosis not present

## 2022-02-11 DIAGNOSIS — M25562 Pain in left knee: Secondary | ICD-10-CM | POA: Diagnosis not present

## 2022-02-11 DIAGNOSIS — M6281 Muscle weakness (generalized): Secondary | ICD-10-CM | POA: Diagnosis not present

## 2022-02-11 DIAGNOSIS — R2681 Unsteadiness on feet: Secondary | ICD-10-CM | POA: Diagnosis not present

## 2022-02-11 DIAGNOSIS — M25551 Pain in right hip: Secondary | ICD-10-CM | POA: Diagnosis not present

## 2022-02-11 DIAGNOSIS — R2689 Other abnormalities of gait and mobility: Secondary | ICD-10-CM | POA: Diagnosis not present

## 2022-02-14 NOTE — Progress Notes (Signed)
   I, Kristi Hunter, LAT, ATC, am serving as scribe for Dr. Lynne Hunter.  Kristi Hunter is a 81 y.o. female who presents to [place] [facility] today for f/u bilat knee pain. She lives at [place]. Pt completed the Orthovisc series, 3/3, in her L knee on 09/27/21. She had a R knee TKR previously. Pt was last seen by Dr. Georgina Snell on 12/31/21 and was given a L knee steroid injection and was referred to PT at [facility]. Today, pt reports that she has been having pain at her L post knee and notes that she has a "knot" in her post knee.  She has been doing PT and they are working on both her L and R hip.  Dx imaging: 01/25/21 L knee XR             06/07/19 L knee XR  Pertinent review of systems: No fevers or chills  Relevant historical information: Hypertension.  Obesity.   Exam:  BP 120/72 (BP Location: Right Arm, Patient Position: Sitting, Cuff Size: Large)   Pulse 86   Ht '5\' 3"'$  (1.6 m)   Wt 252 lb 3.2 oz (114.4 kg)   SpO2 94%   BMI 44.68 kg/m  General: Well Developed, well nourished, and in no acute distress.   MSK: Left knee: Normal.  Normal motion with crepitation. Right hip: Tender palpation at greater trochanter.    Lab and Radiology Results  EXAM: LEFT KNEE 3 VIEWS   COMPARISON:  None.   FINDINGS: No evidence of fracture, dislocation, or joint effusion. There is mild patellofemoral osteoarthrosis. Soft tissues are unremarkable.   IMPRESSION: Mild patellofemoral osteoarthrosis.     Electronically Signed   By: Kristi Hunter M.D.   On: 01/26/2021 01:23   I, Kristi Hunter, personally (independently) visualized and performed the interpretation of the images attached in this note.     Assessment and Plan: 81 y.o. female with left knee pain due to DJD.  She had a steroid injection about 6 weeks ago which helped quite a bit.  She is currently receiving physical therapy which is also helping.  We can repeat the steroid injection every 3 months which  would be as early as August 22.  Weight loss would also help.  She has a follow-up appointment with Dr. Leafy Ro in the near future.  I wonder if GLP agonist such as Mancel Parsons would be helpful.  Ultimately I think she will need to knee replacement and getting her BMI under 40 would be helpful for that.  Additionally she has right lateral hip pain due to trochanteric bursitis.  Physical therapy is helping with this however she may need a hip injection at the greater trochanter bursa at some point in the future.  Happy to do so whenever she feels the need.  Recheck around about August 22.    Discussed warning signs or symptoms. Please see discharge instructions. Patient expresses understanding.   The above documentation has been reviewed and is accurate and complete Kristi Hunter, M.D.

## 2022-02-18 ENCOUNTER — Encounter: Payer: Self-pay | Admitting: Family Medicine

## 2022-02-18 ENCOUNTER — Ambulatory Visit (INDEPENDENT_AMBULATORY_CARE_PROVIDER_SITE_OTHER): Payer: Medicare HMO | Admitting: Family Medicine

## 2022-02-18 VITALS — BP 120/72 | HR 86 | Ht 63.0 in | Wt 252.2 lb

## 2022-02-18 DIAGNOSIS — M7061 Trochanteric bursitis, right hip: Secondary | ICD-10-CM

## 2022-02-18 DIAGNOSIS — G8929 Other chronic pain: Secondary | ICD-10-CM | POA: Diagnosis not present

## 2022-02-18 DIAGNOSIS — M25562 Pain in left knee: Secondary | ICD-10-CM | POA: Diagnosis not present

## 2022-02-18 DIAGNOSIS — M1712 Unilateral primary osteoarthritis, left knee: Secondary | ICD-10-CM

## 2022-02-18 NOTE — Patient Instructions (Addendum)
Good to see you today.  Follow-up: as needed.   I can repeat the knee injection as soon as Aug 22.   I can do a lateral hip injection anytime.   Mancel Parsons may be helpful for weight loss. Dr Leafy Ro will talk about it.

## 2022-02-19 ENCOUNTER — Ambulatory Visit: Payer: Medicare HMO | Admitting: Family Medicine

## 2022-02-19 DIAGNOSIS — M6281 Muscle weakness (generalized): Secondary | ICD-10-CM | POA: Diagnosis not present

## 2022-02-19 DIAGNOSIS — F4389 Other reactions to severe stress: Secondary | ICD-10-CM | POA: Diagnosis not present

## 2022-02-19 DIAGNOSIS — R69 Illness, unspecified: Secondary | ICD-10-CM | POA: Diagnosis not present

## 2022-02-19 DIAGNOSIS — R2689 Other abnormalities of gait and mobility: Secondary | ICD-10-CM | POA: Diagnosis not present

## 2022-02-19 DIAGNOSIS — M25551 Pain in right hip: Secondary | ICD-10-CM | POA: Diagnosis not present

## 2022-02-19 DIAGNOSIS — M25562 Pain in left knee: Secondary | ICD-10-CM | POA: Diagnosis not present

## 2022-02-19 DIAGNOSIS — F321 Major depressive disorder, single episode, moderate: Secondary | ICD-10-CM | POA: Diagnosis not present

## 2022-02-19 DIAGNOSIS — R2681 Unsteadiness on feet: Secondary | ICD-10-CM | POA: Diagnosis not present

## 2022-02-20 ENCOUNTER — Other Ambulatory Visit (INDEPENDENT_AMBULATORY_CARE_PROVIDER_SITE_OTHER): Payer: Self-pay | Admitting: Family Medicine

## 2022-02-20 ENCOUNTER — Ambulatory Visit (INDEPENDENT_AMBULATORY_CARE_PROVIDER_SITE_OTHER): Payer: Medicare HMO | Admitting: Family Medicine

## 2022-02-20 ENCOUNTER — Encounter (INDEPENDENT_AMBULATORY_CARE_PROVIDER_SITE_OTHER): Payer: Self-pay | Admitting: Family Medicine

## 2022-02-20 VITALS — BP 140/83 | HR 80 | Ht 63.0 in | Wt 248.0 lb

## 2022-02-20 DIAGNOSIS — R7303 Prediabetes: Secondary | ICD-10-CM

## 2022-02-20 DIAGNOSIS — Z6841 Body Mass Index (BMI) 40.0 and over, adult: Secondary | ICD-10-CM | POA: Diagnosis not present

## 2022-02-20 DIAGNOSIS — Z87891 Personal history of nicotine dependence: Secondary | ICD-10-CM | POA: Diagnosis not present

## 2022-02-20 DIAGNOSIS — I10 Essential (primary) hypertension: Secondary | ICD-10-CM

## 2022-02-20 DIAGNOSIS — E7849 Other hyperlipidemia: Secondary | ICD-10-CM | POA: Diagnosis not present

## 2022-02-20 DIAGNOSIS — E559 Vitamin D deficiency, unspecified: Secondary | ICD-10-CM

## 2022-02-20 DIAGNOSIS — E7800 Pure hypercholesterolemia, unspecified: Secondary | ICD-10-CM | POA: Insufficient documentation

## 2022-02-20 DIAGNOSIS — E669 Obesity, unspecified: Secondary | ICD-10-CM

## 2022-02-20 MED ORDER — CHLORTHALIDONE 15 MG PO TABS: 15.0000 mg | ORAL_TABLET | Freq: Every day | ORAL | 0 refills | Status: DC

## 2022-02-20 MED ORDER — METFORMIN HCL 500 MG PO TABS: 1000.0000 mg | ORAL_TABLET | Freq: Every day | ORAL | 0 refills | Status: DC

## 2022-02-21 ENCOUNTER — Telehealth: Payer: Self-pay | Admitting: Family Medicine

## 2022-02-21 DIAGNOSIS — R2681 Unsteadiness on feet: Secondary | ICD-10-CM | POA: Diagnosis not present

## 2022-02-21 DIAGNOSIS — I10 Essential (primary) hypertension: Secondary | ICD-10-CM

## 2022-02-21 DIAGNOSIS — M25562 Pain in left knee: Secondary | ICD-10-CM | POA: Diagnosis not present

## 2022-02-21 DIAGNOSIS — M6281 Muscle weakness (generalized): Secondary | ICD-10-CM | POA: Diagnosis not present

## 2022-02-21 DIAGNOSIS — M25551 Pain in right hip: Secondary | ICD-10-CM | POA: Diagnosis not present

## 2022-02-21 DIAGNOSIS — R2689 Other abnormalities of gait and mobility: Secondary | ICD-10-CM | POA: Diagnosis not present

## 2022-02-21 LAB — CBC WITH DIFFERENTIAL/PLATELET
Basophils Absolute: 0.1 10*3/uL (ref 0.0–0.2)
Basos: 1 %
EOS (ABSOLUTE): 0.2 10*3/uL (ref 0.0–0.4)
Eos: 3 %
Hematocrit: 39.6 % (ref 34.0–46.6)
Hemoglobin: 12.6 g/dL (ref 11.1–15.9)
Immature Grans (Abs): 0 10*3/uL (ref 0.0–0.1)
Immature Granulocytes: 0 %
Lymphocytes Absolute: 2.3 10*3/uL (ref 0.7–3.1)
Lymphs: 30 %
MCH: 27.6 pg (ref 26.6–33.0)
MCHC: 31.8 g/dL (ref 31.5–35.7)
MCV: 87 fL (ref 79–97)
Monocytes Absolute: 0.4 10*3/uL (ref 0.1–0.9)
Monocytes: 5 %
Neutrophils Absolute: 4.6 10*3/uL (ref 1.4–7.0)
Neutrophils: 61 %
Platelets: 318 10*3/uL (ref 150–450)
RBC: 4.56 x10E6/uL (ref 3.77–5.28)
RDW: 13 % (ref 11.7–15.4)
WBC: 7.6 10*3/uL (ref 3.4–10.8)

## 2022-02-21 LAB — CMP14+EGFR
ALT: 19 IU/L (ref 0–32)
AST: 16 IU/L (ref 0–40)
Albumin/Globulin Ratio: 1.5 (ref 1.2–2.2)
Albumin: 3.7 g/dL — ABNORMAL LOW (ref 3.8–4.8)
Alkaline Phosphatase: 54 IU/L (ref 44–121)
BUN/Creatinine Ratio: 25 (ref 12–28)
BUN: 15 mg/dL (ref 8–27)
Bilirubin Total: 0.2 mg/dL (ref 0.0–1.2)
CO2: 28 mmol/L (ref 20–29)
Calcium: 9.4 mg/dL (ref 8.7–10.3)
Chloride: 98 mmol/L (ref 96–106)
Creatinine, Ser: 0.6 mg/dL (ref 0.57–1.00)
Globulin, Total: 2.5 g/dL (ref 1.5–4.5)
Glucose: 88 mg/dL (ref 70–99)
Potassium: 4.1 mmol/L (ref 3.5–5.2)
Sodium: 140 mmol/L (ref 134–144)
Total Protein: 6.2 g/dL (ref 6.0–8.5)
eGFR: 92 mL/min/{1.73_m2} (ref >59)

## 2022-02-21 LAB — LIPID PANEL WITH LDL/HDL RATIO
Cholesterol, Total: 159 mg/dL (ref 100–199)
HDL: 49 mg/dL (ref >39)
LDL Chol Calc (NIH): 98 mg/dL (ref 0–99)
LDL/HDL Ratio: 2 ratio (ref 0.0–3.2)
Triglycerides: 58 mg/dL (ref 0–149)
VLDL Cholesterol Cal: 12 mg/dL (ref 5–40)

## 2022-02-21 LAB — VITAMIN D 25 HYDROXY (VIT D DEFICIENCY, FRACTURES): Vit D, 25-Hydroxy: 32.7 ng/mL (ref 30.0–100.0)

## 2022-02-21 LAB — HEMOGLOBIN A1C
Est. average glucose Bld gHb Est-mCnc: 123 mg/dL
Hgb A1c MFr Bld: 5.9 % — ABNORMAL HIGH (ref 4.8–5.6)

## 2022-02-21 LAB — INSULIN, RANDOM: INSULIN: 22.5 u[IU]/mL (ref 2.6–24.9)

## 2022-02-25 ENCOUNTER — Telehealth: Payer: Self-pay | Admitting: Family Medicine

## 2022-02-25 DIAGNOSIS — I10 Essential (primary) hypertension: Secondary | ICD-10-CM

## 2022-02-25 NOTE — Progress Notes (Signed)
Chief Complaint:   OBESITY Kristi Hunter is here to discuss her progress with her obesity treatment plan along with follow-up of her obesity related diagnoses. Kristi Hunter is on practicing portion control and making smarter food choices, such as increasing vegetables and decreasing simple carbohydrates and states she is following her eating plan approximately 50% of the time. Kristi Hunter states she is taking exercise classes for 75 minutes 2 times per week.  Today's visit was #: 25 Starting weight: 267 lbs Starting date: 02/22/2020 Today's weight: 248 lbs Today's date: 02/20/2022 Total lbs lost to date: 19 Total lbs lost since last in-office visit: 2  Interim History: Kristi Hunter continues to lose weight, but she is frustrated that her weight loss is not faster.  She needs to have knee replacement surgery, and her BMI needs to be below 40.  She is unable to follow a structured plan and instead she is working on portion control and Psychologist, counselling.  Subjective:   1. Essential hypertension [name] wants to decrease her chlorthalidone as she is urinating a lot especially in the middle of the night.  She plans to discuss this with her PCP.  2. Pre-diabetes Kristi Hunter is on metformin, but she is forgetting her second dose regularly.  She denies GI upset.  3. Vitamin D deficiency Kristi Hunter is on vitamin D, and she is due for labs.  4. Hyperlipidemia, pure [name] is working on decreasing cholesterol in her diet, and she is due for labs.  Assessment/Plan:   1. Essential hypertension Kristi Hunter agreed to change chlorthalidone to 50 mg once every morning, and we will refill for 1 month.  She is to follow-up with her PCP to continue management of medication.  She is to also check her blood pressures at home.  - chlorthalidone (HYGROTEN) 15 MG tablet; Take 1 tablet (15 mg total) by mouth daily.  Dispense: 30 tablet; Refill: 0  2. Pre-diabetes We will check labs today.  Kristi Hunter agreed  to change metformin to 500 mg 2 tablets every morning with breakfast, and we will refill for 1 month.  - CBC with Differential/Platelet - CMP14+EGFR - Insulin, random - Hemoglobin A1c - metFORMIN (GLUCOPHAGE) 500 MG tablet; Take 2 tablets (1,000 mg total) by mouth daily with breakfast.  Dispense: 60 tablet; Refill: 0  3. Vitamin D deficiency We will check labs today. Kristi Hunter will follow-up for routine testing of Vitamin D, at least 2-3 times per year to avoid over-replacement.  - VITAMIN D 25 Hydroxy (Vit-D Deficiency, Fractures)   4. Hyperlipidemia, pure We will check labs today. Kristi Hunter will continue to work on diet, exercise and weight loss efforts. Orders and follow up as documented in patient record.   - Lipid Panel With LDL/HDL Ratio  5. Obesity, Current BMI 44.0 [name] is currently in the action stage of change. As such, her goal is to continue with weight loss efforts. She has agreed to practicing portion control and making smarter food choices, such as increasing vegetables and decreasing simple carbohydrates.   Exercise goals: As is.   Behavioral modification strategies: increasing lean protein intake and meal planning and cooking strategies.  Kristi Hunter has agreed to follow-up with our clinic in 4 weeks. She was informed of the importance of frequent follow-up visits to maximize her success with intensive lifestyle modifications for her multiple health conditions.   Kristi Hunter was informed we would discuss her lab results at her next visit unless there is a critical issue that needs to be addressed sooner. Kristi Hunter agreed to  keep her next visit at the agreed upon time to discuss these results.  Objective:   Blood pressure 140/83, pulse 80, height _0  (1.6 m), weight 248 lb (112.5 kg), SpO2 94 %. Body mass index is 43.93 kg/m.  General: Cooperative, alert, well developed, in no acute distress. HEENT: Conjunctivae and lids unremarkable. Cardiovascular: Regular  rhythm.  Lungs: Normal work of breathing. Neurologic: No focal deficits.   Lab Results  Component Value Date   CREATININE 0.60 02/20/2022   BUN 15 02/20/2022   NA 140 02/20/2022   K 4.1 02/20/2022   CL 98 02/20/2022   CO2 28 02/20/2022   Lab Results  Component Value Date   ALT 19 02/20/2022   AST 16 02/20/2022   ALKPHOS 54 02/20/2022   BILITOT 0.2 02/20/2022   Lab Results  Component Value Date   HGBA1C 5.9 (H) 02/20/2022   HGBA1C 5.8 (H) 12/25/2021   HGBA1C 5.7 (H) 04/12/2021   HGBA1C 5.8 (H) 02/19/2021   HGBA1C 5.9 (H) 02/22/2020   Lab Results  Component Value Date   INSULIN 22.5 02/20/2022   INSULIN 37.9 (H) 04/12/2021   INSULIN 21.5 02/22/2020   Lab Results  Component Value Date   TSH 1.470 02/22/2020   Lab Results  Component Value Date   CHOL 159 02/20/2022   HDL 49 02/20/2022   LDLCALC 98 02/20/2022   TRIG 58 02/20/2022   CHOLHDL 3 01/13/2019   Lab Results  Component Value Date   VD25OH 32.7 02/20/2022   VD25OH 35.1 12/25/2021   VD25OH 46.8 04/12/2021   Lab Results  Component Value Date   WBC 7.6 02/20/2022   HGB 12.6 02/20/2022   HCT 39.6 02/20/2022   MCV 87 02/20/2022   PLT 318 02/20/2022   No results found for: "IRON", "TIBC", "FERRITIN"  Obesity Behavioral Intervention:   Approximately 15 minutes were spent on the discussion below.  ASK: We discussed the diagnosis of obesity with Kristi Hunter today and Kristi Hunter agreed to give Kristi Hunter permission to discuss obesity behavioral modification therapy today.  ASSESS: Kristi Hunter has the diagnosis of obesity and her BMI today is 44.0. Kristi Hunter is in the action stage of change.   ADVISE: Kristi Hunter was educated on the multiple health risks of obesity as well as the benefit of weight loss to improve her health. She was advised of the need for long term treatment and the importance of lifestyle modifications to improve her current health and to decrease her risk of future health  problems.  AGREE: Multiple dietary modification options and treatment options were discussed and Kristi Hunter agreed to follow the recommendations documented in the above note.  ARRANGE: Kristi Hunter was educated on the importance of frequent visits to treat obesity as outlined per CMS and USPSTF guidelines and agreed to schedule her next follow up appointment today.  Attestation Statements:   Reviewed by clinician on day of visit: allergies, medications, problem list, medical history, surgical history, family history, social history, and previous encounter notes.   I, Trixie Dredge, am acting as transcriptionist for [name], MD.  I have reviewed the above documentation for accuracy and completeness, and I agree with the above. -  [name], MD

## 2022-02-25 NOTE — Telephone Encounter (Signed)
  Encourage patient to contact the pharmacy for refills or they can request refills through [place]:  Please schedule appointment if longer than 1 year  NEXT APPOINTMENT DATE:  MEDICATION: chlorthalidone (HYGROTEN) 15 MG tablet   Is the patient out of medication?   PHARMACY: [id number] [address] [place] Let patient know to contact pharmacy at the end of the day to make sure medication is ready.  Please notify patient to allow 48-72 hours to process  CLINICAL FILLS OUT ALL BELOW:   LAST REFILL:  QTY:  REFILL DATE:    OTHER COMMENTS: pt stated that she was told she was not a dr .kEthelene Halpt anymore and she has been dismissed from the practice    Okay for refill?  Please advise

## 2022-02-25 NOTE — Telephone Encounter (Signed)
Pt called because she need a refill for her medication was told that when she went to the pharmacy to get medication that she was told no longer dr. [name] pt.  Pt called and was upset and want to know why because she never was called or was sent a dismiss letter. Please call pt

## 2022-02-26 ENCOUNTER — Other Ambulatory Visit: Payer: Self-pay | Admitting: Family Medicine

## 2022-02-26 DIAGNOSIS — C50411 Malignant neoplasm of upper-outer quadrant of right female breast: Secondary | ICD-10-CM | POA: Diagnosis not present

## 2022-02-26 DIAGNOSIS — M25551 Pain in right hip: Secondary | ICD-10-CM | POA: Diagnosis not present

## 2022-02-26 DIAGNOSIS — R2681 Unsteadiness on feet: Secondary | ICD-10-CM | POA: Diagnosis not present

## 2022-02-26 DIAGNOSIS — Z170 Estrogen receptor positive status [ER+]: Secondary | ICD-10-CM | POA: Diagnosis not present

## 2022-02-26 DIAGNOSIS — I10 Essential (primary) hypertension: Secondary | ICD-10-CM

## 2022-02-26 DIAGNOSIS — R2689 Other abnormalities of gait and mobility: Secondary | ICD-10-CM | POA: Diagnosis not present

## 2022-02-26 DIAGNOSIS — R922 Inconclusive mammogram: Secondary | ICD-10-CM | POA: Diagnosis not present

## 2022-02-26 DIAGNOSIS — M6281 Muscle weakness (generalized): Secondary | ICD-10-CM | POA: Diagnosis not present

## 2022-02-26 DIAGNOSIS — M25562 Pain in left knee: Secondary | ICD-10-CM | POA: Diagnosis not present

## 2022-02-26 MED ORDER — CHLORTHALIDONE 15 MG PO TABS: 15.0000 mg | ORAL_TABLET | Freq: Every day | ORAL | 0 refills | Status: DC

## 2022-02-26 NOTE — Telephone Encounter (Signed)
Requested Rx sent in  

## 2022-02-26 NOTE — Telephone Encounter (Signed)
Spoke with patient informed that our records does not show that she is dismissed. Appointment scheduled for follow up refill sent in

## 2022-02-28 DIAGNOSIS — M25551 Pain in right hip: Secondary | ICD-10-CM | POA: Diagnosis not present

## 2022-02-28 DIAGNOSIS — R2689 Other abnormalities of gait and mobility: Secondary | ICD-10-CM | POA: Diagnosis not present

## 2022-02-28 DIAGNOSIS — R2681 Unsteadiness on feet: Secondary | ICD-10-CM | POA: Diagnosis not present

## 2022-02-28 DIAGNOSIS — M6281 Muscle weakness (generalized): Secondary | ICD-10-CM | POA: Diagnosis not present

## 2022-02-28 DIAGNOSIS — M25562 Pain in left knee: Secondary | ICD-10-CM | POA: Diagnosis not present

## 2022-03-05 DIAGNOSIS — M25562 Pain in left knee: Secondary | ICD-10-CM | POA: Diagnosis not present

## 2022-03-05 DIAGNOSIS — M25551 Pain in right hip: Secondary | ICD-10-CM | POA: Diagnosis not present

## 2022-03-05 DIAGNOSIS — F321 Major depressive disorder, single episode, moderate: Secondary | ICD-10-CM | POA: Diagnosis not present

## 2022-03-05 DIAGNOSIS — R350 Frequency of micturition: Secondary | ICD-10-CM | POA: Diagnosis not present

## 2022-03-05 DIAGNOSIS — R2681 Unsteadiness on feet: Secondary | ICD-10-CM | POA: Diagnosis not present

## 2022-03-05 DIAGNOSIS — R69 Illness, unspecified: Secondary | ICD-10-CM | POA: Diagnosis not present

## 2022-03-05 DIAGNOSIS — F4389 Other reactions to severe stress: Secondary | ICD-10-CM | POA: Diagnosis not present

## 2022-03-05 DIAGNOSIS — M6281 Muscle weakness (generalized): Secondary | ICD-10-CM | POA: Diagnosis not present

## 2022-03-05 DIAGNOSIS — R2689 Other abnormalities of gait and mobility: Secondary | ICD-10-CM | POA: Diagnosis not present

## 2022-03-12 DIAGNOSIS — R69 Illness, unspecified: Secondary | ICD-10-CM | POA: Diagnosis not present

## 2022-03-12 DIAGNOSIS — F321 Major depressive disorder, single episode, moderate: Secondary | ICD-10-CM | POA: Diagnosis not present

## 2022-03-12 DIAGNOSIS — R2681 Unsteadiness on feet: Secondary | ICD-10-CM | POA: Diagnosis not present

## 2022-03-12 DIAGNOSIS — M6281 Muscle weakness (generalized): Secondary | ICD-10-CM | POA: Diagnosis not present

## 2022-03-12 DIAGNOSIS — M25562 Pain in left knee: Secondary | ICD-10-CM | POA: Diagnosis not present

## 2022-03-12 DIAGNOSIS — R2689 Other abnormalities of gait and mobility: Secondary | ICD-10-CM | POA: Diagnosis not present

## 2022-03-12 DIAGNOSIS — F4389 Other reactions to severe stress: Secondary | ICD-10-CM | POA: Diagnosis not present

## 2022-03-12 DIAGNOSIS — M25551 Pain in right hip: Secondary | ICD-10-CM | POA: Diagnosis not present

## 2022-03-14 DIAGNOSIS — M6281 Muscle weakness (generalized): Secondary | ICD-10-CM | POA: Diagnosis not present

## 2022-03-14 DIAGNOSIS — R2689 Other abnormalities of gait and mobility: Secondary | ICD-10-CM | POA: Diagnosis not present

## 2022-03-14 DIAGNOSIS — M25562 Pain in left knee: Secondary | ICD-10-CM | POA: Diagnosis not present

## 2022-03-14 DIAGNOSIS — M25551 Pain in right hip: Secondary | ICD-10-CM | POA: Diagnosis not present

## 2022-03-14 DIAGNOSIS — R2681 Unsteadiness on feet: Secondary | ICD-10-CM | POA: Diagnosis not present

## 2022-03-15 ENCOUNTER — Ambulatory Visit (INDEPENDENT_AMBULATORY_CARE_PROVIDER_SITE_OTHER): Payer: Medicare HMO | Admitting: Family Medicine

## 2022-03-15 ENCOUNTER — Encounter: Payer: Self-pay | Admitting: Family Medicine

## 2022-03-15 VITALS — BP 122/70 | HR 90 | Temp 97.1°F | Ht 63.0 in | Wt 253.2 lb

## 2022-03-15 DIAGNOSIS — I872 Venous insufficiency (chronic) (peripheral): Secondary | ICD-10-CM | POA: Diagnosis not present

## 2022-03-15 DIAGNOSIS — I10 Essential (primary) hypertension: Secondary | ICD-10-CM

## 2022-03-15 DIAGNOSIS — E8809 Other disorders of plasma-protein metabolism, not elsewhere classified: Secondary | ICD-10-CM

## 2022-03-15 DIAGNOSIS — R609 Edema, unspecified: Secondary | ICD-10-CM

## 2022-03-15 LAB — HEPATIC FUNCTION PANEL
ALT: 17 U/L (ref 0–35)
AST: 19 U/L (ref 0–37)
Albumin: 3.4 g/dL — ABNORMAL LOW (ref 3.5–5.2)
Alkaline Phosphatase: 43 U/L (ref 39–117)
Bilirubin, Direct: 0.1 mg/dL (ref 0.0–0.3)
Total Bilirubin: 0.3 mg/dL (ref 0.2–1.2)
Total Protein: 6.5 g/dL (ref 6.0–8.3)

## 2022-03-15 LAB — PROTIME-INR
INR: 1 ratio (ref 0.8–1.0)
Prothrombin Time: 11.2 s (ref 9.6–13.1)

## 2022-03-15 LAB — BASIC METABOLIC PANEL
BUN: 18 mg/dL (ref 6–23)
CO2: 33 mEq/L — ABNORMAL HIGH (ref 19–32)
Calcium: 8.6 mg/dL (ref 8.4–10.5)
Chloride: 100 mEq/L (ref 96–112)
Creatinine, Ser: 0.71 mg/dL (ref 0.40–1.20)
GFR: 81.33 mL/min (ref >60.00)
Glucose, Bld: 101 mg/dL — ABNORMAL HIGH (ref 70–99)
Potassium: 3.5 mEq/L (ref 3.5–5.1)
Sodium: 139 mEq/L (ref 135–145)

## 2022-03-15 LAB — APTT: aPTT: 28.9 s (ref 25.4–36.8)

## 2022-03-15 MED ORDER — CHLORTHALIDONE 25 MG PO TABS: 25.0000 mg | ORAL_TABLET | Freq: Every day | ORAL | 0 refills | Status: DC

## 2022-03-15 NOTE — Progress Notes (Signed)
Established Patient Office Visit  Subjective   Patient ID: Kristi Hunter, female    DOB: 11-02-1940  Age: 81 y.o. MRN: [id number]  Chief Complaint  Patient presents with   Follow-up    Routine follow up concerns about swelling in feet, pain in right shin. Patient not fasting.     HPI evaluation of recent onset lower extremity edema over the last 2 to 3 days.  Longstanding history of venous insufficiency probable lymphedema.  Has not tolerated compression stockings in the past.  Has no history of congestive heart failure or liver issues.  Continues to be active physically.  Goes to exercise class regularly pending burn.  Denies chest pain or shortness of breath.  Currently working with weight loss management.    Review of Systems  Constitutional:  Negative for chills, diaphoresis, malaise/fatigue and weight loss.  HENT: Negative.    Eyes: Negative.  Negative for blurred vision and double vision.  Respiratory: Negative.  Negative for shortness of breath.   Cardiovascular:  Negative for chest pain.  Gastrointestinal:  Negative for abdominal pain.  Genitourinary: Negative.   Musculoskeletal:  Negative for falls and myalgias.  Neurological:  Negative for speech change, loss of consciousness and weakness.  Psychiatric/Behavioral: Negative.        Objective:     BP 122/70 (BP Location: Right Arm, Patient Position: Sitting, Cuff Size: Large)   Pulse 90   Temp (!) 97.1 F (36.2 C) (Temporal)   Ht '5\' 3"'$  (1.6 m)   Wt 253 lb 3.2 oz (114.9 kg)   SpO2 92%   BMI 44.85 kg/m  BP Readings from Last 3 Encounters:  03/15/22 122/70  02/20/22 140/83  02/18/22 120/72   Wt Readings from Last 3 Encounters:  03/15/22 253 lb 3.2 oz (114.9 kg)  02/20/22 248 lb (112.5 kg)  02/18/22 252 lb 3.2 oz (114.4 kg)      Physical Exam Constitutional:      General: She is not in acute distress.    Appearance: Normal appearance. She is not ill-appearing, toxic-appearing or diaphoretic.  HENT:      Head: Normocephalic and atraumatic.     Right Ear: External ear normal.     Left Ear: External ear normal.     Mouth/Throat:     Mouth: Mucous membranes are moist.     Pharynx: Oropharynx is clear. No oropharyngeal exudate or posterior oropharyngeal erythema.  Eyes:     General: No scleral icterus.       Right eye: No discharge.        Left eye: No discharge.     Extraocular Movements: Extraocular movements intact.     Conjunctiva/sclera: Conjunctivae normal.     Pupils: Pupils are equal, round, and reactive to light.  Cardiovascular:     Rate and Rhythm: Normal rate and regular rhythm.  Pulmonary:     Effort: Pulmonary effort is normal. No respiratory distress.     Breath sounds: Normal breath sounds.  Abdominal:     General: Bowel sounds are normal.  Musculoskeletal:     Cervical back: No rigidity or tenderness.     Right lower leg: Edema present.     Left lower leg: Edema present.  Skin:    General: Skin is warm and dry.  Neurological:     Mental Status: She is alert and oriented to person, place, and time.  Psychiatric:        Mood and Affect: Mood normal.  Behavior: Behavior normal.      No results found for any visits on 03/15/22.    The 10-year ASCVD risk score (Arnett DK, et al., 2019) is: 43.9%    Assessment & Plan:   Problem List Items Addressed This Visit       Cardiovascular and Mediastinum   Essential hypertension   Relevant Medications   chlorthalidone (HYGROTON) 25 MG tablet   Other Relevant Orders   Basic metabolic panel   Venous insufficiency - Primary   Relevant Medications   chlorthalidone (HYGROTON) 25 MG tablet   Other Relevant Orders   Prealbumin   Ambulatory referral to Vascular Surgery   Basic metabolic panel   Hepatic function panel     Other   Edema   Relevant Orders   Prealbumin   Ambulatory referral to Vascular Surgery   Basic metabolic panel   Hepatic function panel   Other Visit Diagnoses      Hypoalbuminemia       Relevant Orders   Hepatic function panel   PTT   Protime-INR       Return in about 3 months (around 06/15/2022).  Patient with ongoing history of venous insufficiency with lymphedema.  Albumin levels have been low normal.  She is not malnourished.  No evidence for CHF.  While liver enzymes have been normal, would have to consider hepatic steatosis.  May have to consider advanced imaging.  Libby Maw, MD

## 2022-03-16 LAB — PREALBUMIN: Prealbumin: 16 mg/dL — ABNORMAL LOW (ref 17–34)

## 2022-03-19 DIAGNOSIS — M25551 Pain in right hip: Secondary | ICD-10-CM | POA: Diagnosis not present

## 2022-03-19 DIAGNOSIS — M25562 Pain in left knee: Secondary | ICD-10-CM | POA: Diagnosis not present

## 2022-03-19 DIAGNOSIS — R2681 Unsteadiness on feet: Secondary | ICD-10-CM | POA: Diagnosis not present

## 2022-03-19 DIAGNOSIS — M6281 Muscle weakness (generalized): Secondary | ICD-10-CM | POA: Diagnosis not present

## 2022-03-19 DIAGNOSIS — R079 Chest pain, unspecified: Secondary | ICD-10-CM | POA: Diagnosis not present

## 2022-03-19 DIAGNOSIS — R2689 Other abnormalities of gait and mobility: Secondary | ICD-10-CM | POA: Diagnosis not present

## 2022-03-20 ENCOUNTER — Other Ambulatory Visit (INDEPENDENT_AMBULATORY_CARE_PROVIDER_SITE_OTHER): Payer: Self-pay | Admitting: Family Medicine

## 2022-03-20 ENCOUNTER — Encounter (INDEPENDENT_AMBULATORY_CARE_PROVIDER_SITE_OTHER): Payer: Self-pay

## 2022-03-20 ENCOUNTER — Ambulatory Visit (INDEPENDENT_AMBULATORY_CARE_PROVIDER_SITE_OTHER): Payer: Medicare HMO | Admitting: Family Medicine

## 2022-03-20 ENCOUNTER — Encounter (INDEPENDENT_AMBULATORY_CARE_PROVIDER_SITE_OTHER): Payer: Self-pay | Admitting: Family Medicine

## 2022-03-20 VITALS — BP 121/77 | HR 83 | Temp 97.6°F | Ht 63.0 in | Wt 250.0 lb

## 2022-03-20 DIAGNOSIS — E669 Obesity, unspecified: Secondary | ICD-10-CM

## 2022-03-20 DIAGNOSIS — F3289 Other specified depressive episodes: Secondary | ICD-10-CM | POA: Diagnosis not present

## 2022-03-20 DIAGNOSIS — Z7984 Long term (current) use of oral hypoglycemic drugs: Secondary | ICD-10-CM

## 2022-03-20 DIAGNOSIS — R7303 Prediabetes: Secondary | ICD-10-CM

## 2022-03-20 DIAGNOSIS — Z6841 Body Mass Index (BMI) 40.0 and over, adult: Secondary | ICD-10-CM | POA: Diagnosis not present

## 2022-03-20 DIAGNOSIS — R69 Illness, unspecified: Secondary | ICD-10-CM | POA: Diagnosis not present

## 2022-03-20 MED ORDER — BUPROPION HCL ER (SR) 150 MG PO TB12: 150.0000 mg | ORAL_TABLET | Freq: Every morning | ORAL | 0 refills | Status: DC

## 2022-03-20 MED ORDER — METFORMIN HCL 500 MG PO TABS: 1000.0000 mg | ORAL_TABLET | Freq: Every day | ORAL | 0 refills | Status: DC

## 2022-03-21 ENCOUNTER — Other Ambulatory Visit: Payer: Self-pay | Admitting: Family Medicine

## 2022-03-21 DIAGNOSIS — I10 Essential (primary) hypertension: Secondary | ICD-10-CM

## 2022-03-26 DIAGNOSIS — F321 Major depressive disorder, single episode, moderate: Secondary | ICD-10-CM | POA: Diagnosis not present

## 2022-03-26 DIAGNOSIS — F4389 Other reactions to severe stress: Secondary | ICD-10-CM | POA: Diagnosis not present

## 2022-03-26 DIAGNOSIS — R69 Illness, unspecified: Secondary | ICD-10-CM | POA: Diagnosis not present

## 2022-03-27 NOTE — Progress Notes (Signed)
Chief Complaint:   OBESITY Kristi Hunter is here to discuss her progress with her obesity treatment plan along with follow-up of her obesity related diagnoses. Kristi Hunter is on practicing portion control and making smarter food choices, such as increasing vegetables and decreasing simple carbohydrates and states she is following her eating plan approximately 40% of the time. Kristi Hunter states she is water aerobics, and physical therapy for 30 minutes 3 times per week.  Today's visit was #: 26 Starting weight: 267 lbs Starting date: 02/22/2020 Today's weight: 250 lbs Today's date: 03/20/2022 Total lbs lost to date: 17 Total lbs lost since last in-office visit: 0  Interim History: Kristi Hunter has struggled more with increased simple carbohydrates especially bagels and sweets. She plans on buying smaller bagels to help her portion control. She is trying to stay active.   Subjective:   1. Pre-diabetes Kristi Hunter continues to work on exercise, but her simple carbohydrates increased. She denies nausea, vomiting, or hypoglycemia.   2. Other depression with emotional eating Kristi Hunter is working on decreasing emotional eating behaviors. She is being mindful and trying to portion control. No side effects were noted with her medications.   Assessment/Plan:   1. Pre-diabetes We will refill metformin for 1 month. Rickie will continue to work on weight loss, exercise, and decreasing simple carbohydrates to help decrease the risk of diabetes.   - metFORMIN (GLUCOPHAGE) 500 MG tablet; Take 2 tablets (1,000 mg total) by mouth daily with breakfast.  Dispense: 30 tablet; Refill: 0  2. Other depression with emotional eating We will refill Wellbutrin SR for 1 month. Behavior modification techniques were discussed today to help Kristi Hunter deal with her emotional/non-hunger eating behaviors.  Orders and follow up as documented in patient record.   - buPROPion (WELLBUTRIN SR) 150 MG 12 hr tablet; Take 1  tablet (150 mg total) by mouth in the morning.  Dispense: 30 tablet; Refill: 0  3. Obesity, Current BMI 44.3 Kristi Hunter is currently in the action stage of change. As such, her goal is to continue with weight loss efforts. She has agreed to practicing portion control and making smarter food choices, such as increasing vegetables and decreasing simple carbohydrates.   Exercise goals: As is.   Behavioral modification strategies: increasing lean protein intake.  Kristi Hunter has agreed to follow-up with our clinic in 4 weeks. She was informed of the importance of frequent follow-up visits to maximize her success with intensive lifestyle modifications for her multiple health conditions.   Objective:   Blood pressure 121/77, pulse 83, temperature 97.6 F (36.4 C), height '5\' 3"'$  (1.6 m), weight 250 lb (113.4 kg), SpO2 95 %. Body mass index is 44.29 kg/m.  General: Cooperative, alert, well developed, in no acute distress. HEENT: Conjunctivae and lids unremarkable. Cardiovascular: Regular rhythm.  Lungs: Normal work of breathing. Neurologic: No focal deficits.   Lab Results  Component Value Date   CREATININE 0.71 03/15/2022   BUN 18 03/15/2022   NA 139 03/15/2022   K 3.5 03/15/2022   CL 100 03/15/2022   CO2 33 (H) 03/15/2022   Lab Results  Component Value Date   ALT 17 03/15/2022   AST 19 03/15/2022   ALKPHOS 43 03/15/2022   BILITOT 0.3 03/15/2022   Lab Results  Component Value Date   HGBA1C 5.9 (H) 02/20/2022   HGBA1C 5.8 (H) 12/25/2021   HGBA1C 5.7 (H) 04/12/2021   HGBA1C 5.8 (H) 02/19/2021   HGBA1C 5.9 (H) 02/22/2020   Lab Results  Component Value Date  INSULIN 22.5 02/20/2022   INSULIN 37.9 (H) 04/12/2021   INSULIN 21.5 02/22/2020   Lab Results  Component Value Date   TSH 1.470 02/22/2020   Lab Results  Component Value Date   CHOL 159 02/20/2022   HDL 49 02/20/2022   LDLCALC 98 02/20/2022   TRIG 58 02/20/2022   CHOLHDL 3 01/13/2019   Lab Results  Component  Value Date   VD25OH 32.7 02/20/2022   VD25OH 35.1 12/25/2021   VD25OH 46.8 04/12/2021   Lab Results  Component Value Date   WBC 7.6 02/20/2022   HGB 12.6 02/20/2022   HCT 39.6 02/20/2022   MCV 87 02/20/2022   PLT 318 02/20/2022   No results found for: "IRON", "TIBC", "FERRITIN"  Obesity Behavioral Intervention:   Approximately 15 minutes were spent on the discussion below.  ASK: We discussed the diagnosis of obesity with [name] today and Elva agreed to give [name] permission to discuss obesity behavioral modification therapy today.  ASSESS: Serrena has the diagnosis of obesity and her BMI today is 44.3. Georgiann is in the action stage of change.   ADVISE: Makaiyah was educated on the multiple health risks of obesity as well as the benefit of weight loss to improve her health. She was advised of the need for long term treatment and the importance of lifestyle modifications to improve her current health and to decrease her risk of future health problems.  AGREE: Multiple dietary modification options and treatment options were discussed and Amera agreed to follow the recommendations documented in the above note.  ARRANGE: Latoyna was educated on the importance of frequent visits to treat obesity as outlined per CMS and USPSTF guidelines and agreed to schedule her next follow up appointment today.  Attestation Statements:   Reviewed by clinician on day of visit: allergies, medications, problem list, medical history, surgical history, family history, social history, and previous encounter notes.   I, Trixie Dredge, am acting as transcriptionist for [name], MD.  I have reviewed the above documentation for accuracy and completeness, and I agree with the above. -  [name], MD

## 2022-03-28 DIAGNOSIS — R2689 Other abnormalities of gait and mobility: Secondary | ICD-10-CM | POA: Diagnosis not present

## 2022-03-28 DIAGNOSIS — M6281 Muscle weakness (generalized): Secondary | ICD-10-CM | POA: Diagnosis not present

## 2022-03-28 DIAGNOSIS — M25562 Pain in left knee: Secondary | ICD-10-CM | POA: Diagnosis not present

## 2022-03-28 DIAGNOSIS — M25551 Pain in right hip: Secondary | ICD-10-CM | POA: Diagnosis not present

## 2022-03-28 DIAGNOSIS — R2681 Unsteadiness on feet: Secondary | ICD-10-CM | POA: Diagnosis not present

## 2022-03-29 ENCOUNTER — Other Ambulatory Visit: Payer: Self-pay | Admitting: *Deleted

## 2022-03-29 DIAGNOSIS — I872 Venous insufficiency (chronic) (peripheral): Secondary | ICD-10-CM

## 2022-04-01 ENCOUNTER — Ambulatory Visit (HOSPITAL_COMMUNITY)
Admission: RE | Admit: 2022-04-01 | Discharge: 2022-04-01 | Disposition: A | Discharge status: Home / Self Care | Payer: Medicare HMO | Source: Ambulatory Visit | Attending: Surgery | Admitting: Surgery

## 2022-04-01 ENCOUNTER — Ambulatory Visit: Payer: Self-pay

## 2022-04-01 ENCOUNTER — Ambulatory Visit: Payer: Medicare HMO | Admitting: Family Medicine

## 2022-04-01 VITALS — BP 142/78 | HR 91 | Ht 63.0 in | Wt 255.6 lb

## 2022-04-01 DIAGNOSIS — I872 Venous insufficiency (chronic) (peripheral): Secondary | ICD-10-CM | POA: Insufficient documentation

## 2022-04-01 DIAGNOSIS — M25562 Pain in left knee: Secondary | ICD-10-CM

## 2022-04-01 DIAGNOSIS — G8929 Other chronic pain: Secondary | ICD-10-CM

## 2022-04-01 DIAGNOSIS — M25561 Pain in right knee: Secondary | ICD-10-CM | POA: Diagnosis not present

## 2022-04-01 NOTE — Progress Notes (Signed)
I, [name], LAT, ATC acting as a scribe for [name], MD.  Kristi Hunter is a 81 y.o. female who presents to [place] at [facility] today for  f/u bilat knee pain due to DJD. She lives at [facility]. Pt completed the Orthovisc series, 3/3, in her L knee on 09/27/21. She had a R knee TKR previously. Pt was last seen by Dr. Georgina Snell on 02/18/22 and was advised to cont to work on weight loss. Today, pt reports L knee pain is just now starting to return. Pt notes much relief from prior steroid injection.   Dx imaging: 01/25/21 L knee XR             06/07/19 L knee XR  Pertinent review of systems: No fevers or chills  Relevant historical information: Hypertension   Exam:  BP (!) 142/78   Pulse 91   Ht '5\' 3"'$  (1.6 m)   Wt 255 lb 9.6 oz (115.9 kg)   SpO2 94%   BMI 45.28 kg/m  General: Well Developed, well nourished, and in no acute distress.   MSK: Left knee: Mild effusion. Range of motion intact extension flexion limited to about 100 degrees.  Intact strength.    Lab and Radiology Results  Procedure: Real-time Ultrasound Guided Injection of left knee superior lateral patellar space Device: Philips Affiniti 50G Images permanently stored and available for review in PACS Verbal informed consent obtained.  Discussed risks and benefits of procedure. Warned about infection, bleeding, hyperglycemia damage to structures among others. Patient expresses understanding and agreement Time-out conducted.   Noted no overlying erythema, induration, or other signs of local infection.   Skin prepped in a sterile fashion.   Local anesthesia: Topical Ethyl chloride.   With sterile technique and under real time ultrasound guidance: 40 mg of Kenalog and 2 mL of Marcaine injected into knee joint. Fluid seen entering the joint capsule.   Completed without difficulty   Pain immediately resolved suggesting accurate placement of the medication.   Advised to call if  fevers/chills, erythema, induration, drainage, or persistent bleeding.   Images permanently stored and available for review in the ultrasound unit.  Impression: Technically successful ultrasound guided injection.      No results found for this or any previous visit (from the past 72 hour(s)). VAS [name] LOWER EXTREMITY VENOUS REFLUX  Result Date: 04/01/2022  Lower Venous Reflux Study Patient Name:  Kristi Hunter  Date of Exam:   04/01/2022 Medical Rec #: [id number]         Accession #:    [id number] Date of Birth: [birth year]        Patient Gender: F Patient Age:   89 years Exam Location:  [facility] Procedure:      VAS [name] LOWER EXTREMITY VENOUS REFLUX Referring Phys: EMMA COLLINS --------------------------------------------------------------------------------  Indications: Edema.  Risk Factors: Obesity and B/L Knee Pain. Limitations: Body habitus. Performing Technologist: Elta Guadeloupe RVT, RDMS  Examination Guidelines: A complete evaluation includes B-mode imaging, spectral Doppler, color Doppler, and power Doppler as needed of all accessible portions of each vessel. Bilateral testing is considered an integral part of a complete examination. Limited examinations for reoccurring indications may be performed as noted. The reflux portion of the exam is performed with the patient in reverse Trendelenburg. Significant venous reflux is defined as >500 ms in the superficial venous system, and >1 second in the deep venous system.  Venous Reflux Times +--------------+---------+------+-----------+------------+--------+ RIGHT  Reflux NoRefluxReflux TimeDiameter cmsComments                         Yes                                  +--------------+---------+------+-----------+------------+--------+ CFV                     yes   >1 second                      +--------------+---------+------+-----------+------------+--------+ FV prox       no                                              +--------------+---------+------+-----------+------------+--------+ FV mid        no                                             +--------------+---------+------+-----------+------------+--------+ FV dist       no                                             +--------------+---------+------+-----------+------------+--------+ Popliteal     no                                             +--------------+---------+------+-----------+------------+--------+ GSV at SFJ              yes    >500 ms      0.49             +--------------+---------+------+-----------+------------+--------+ GSV prox thigh          yes    >500 ms      0.74             +--------------+---------+------+-----------+------------+--------+ GSV mid thigh no                            0.62             +--------------+---------+------+-----------+------------+--------+ GSV dist thigh          yes    >500 ms      0.48             +--------------+---------+------+-----------+------------+--------+ GSV at knee             yes    >500 ms      0.42             +--------------+---------+------+-----------+------------+--------+ GSV prox calf           yes    >500 ms      0.23             +--------------+---------+------+-----------+------------+--------+ SSV Pop Fossa           yes    >500 ms      0.25             +--------------+---------+------+-----------+------------+--------+  SSV prox calf no                            0.24             +--------------+---------+------+-----------+------------+--------+   Summary: Right: - No evidence of deep vein thrombosis seen in the right lower extremity, from the common femoral through the popliteal veins. - No evidence of superficial venous thrombosis in the right lower extremity.  - Venous reflux is noted in the right common femoral vein. - Venous reflux is noted in the right sapheno-femoral junction. - Venous reflux is  noted in the right greater saphenous vein in the thigh. - Venous reflux is noted in the right greater saphenous vein in the calf. - Venous reflux is noted in the right short saphenous vein.  Comparisons: There is no previous study available for comparison.  *See table(s) above for measurements and observations.    Preliminary        Assessment and Plan: 81 y.o. female with left knee pain thought to be due to exacerbation of DJD.  She has had great benefit from physical therapy but its been 3 months since her last knee injection and her knee is starting to hurt again.  Plan for repeat steroid injection today.  Recheck as needed although we could proceed with repeat steroid injection in 3 months which would be around July 02, 2022.  The lateral hip pain has improved quite a bit with physical therapy.  Continue home exercise program.  Work on weight loss.  Unfortunately Medicare will not cover Wegovy.   PDMP not reviewed this encounter. Orders Placed This Encounter  Procedures   [name] LIMITED JOINT SPACE STRUCTURES LOW BILAT(NO LINKED CHARGES)    Order Specific Question:   Reason for Exam (SYMPTOM  OR DIAGNOSIS REQUIRED)    Answer:   bilateral knee pain    Order Specific Question:   Preferred imaging location?    Answer:   [place]   No orders of the defined types were placed in this encounter.    Discussed warning signs or symptoms. Please see discharge instructions. Patient expresses understanding.   The above documentation has been reviewed and is accurate and complete [name], M.D.

## 2022-04-01 NOTE — Patient Instructions (Addendum)
Thank you for coming in today.   You received an injection today. Seek immediate medical attention if the joint becomes red, extremely painful, or is oozing fluid.  

## 2022-04-02 DIAGNOSIS — R350 Frequency of micturition: Secondary | ICD-10-CM | POA: Diagnosis not present

## 2022-04-02 DIAGNOSIS — F321 Major depressive disorder, single episode, moderate: Secondary | ICD-10-CM | POA: Diagnosis not present

## 2022-04-02 DIAGNOSIS — F4389 Other reactions to severe stress: Secondary | ICD-10-CM | POA: Diagnosis not present

## 2022-04-02 DIAGNOSIS — R69 Illness, unspecified: Secondary | ICD-10-CM | POA: Diagnosis not present

## 2022-04-04 ENCOUNTER — Other Ambulatory Visit: Payer: Self-pay | Admitting: Family Medicine

## 2022-04-04 DIAGNOSIS — M6281 Muscle weakness (generalized): Secondary | ICD-10-CM | POA: Diagnosis not present

## 2022-04-04 DIAGNOSIS — M25562 Pain in left knee: Secondary | ICD-10-CM | POA: Diagnosis not present

## 2022-04-04 DIAGNOSIS — R2689 Other abnormalities of gait and mobility: Secondary | ICD-10-CM | POA: Diagnosis not present

## 2022-04-04 DIAGNOSIS — R2681 Unsteadiness on feet: Secondary | ICD-10-CM | POA: Diagnosis not present

## 2022-04-04 DIAGNOSIS — M25551 Pain in right hip: Secondary | ICD-10-CM | POA: Diagnosis not present

## 2022-04-04 DIAGNOSIS — M810 Age-related osteoporosis without current pathological fracture: Secondary | ICD-10-CM

## 2022-04-05 DIAGNOSIS — N3946 Mixed incontinence: Secondary | ICD-10-CM | POA: Diagnosis not present

## 2022-04-05 DIAGNOSIS — R351 Nocturia: Secondary | ICD-10-CM | POA: Diagnosis not present

## 2022-04-11 ENCOUNTER — Other Ambulatory Visit (INDEPENDENT_AMBULATORY_CARE_PROVIDER_SITE_OTHER): Payer: Self-pay | Admitting: Family Medicine

## 2022-04-11 DIAGNOSIS — M6281 Muscle weakness (generalized): Secondary | ICD-10-CM | POA: Diagnosis not present

## 2022-04-11 DIAGNOSIS — R2689 Other abnormalities of gait and mobility: Secondary | ICD-10-CM | POA: Diagnosis not present

## 2022-04-11 DIAGNOSIS — M25562 Pain in left knee: Secondary | ICD-10-CM | POA: Diagnosis not present

## 2022-04-11 DIAGNOSIS — M25551 Pain in right hip: Secondary | ICD-10-CM | POA: Diagnosis not present

## 2022-04-11 DIAGNOSIS — R7303 Prediabetes: Secondary | ICD-10-CM

## 2022-04-11 DIAGNOSIS — R2681 Unsteadiness on feet: Secondary | ICD-10-CM | POA: Diagnosis not present

## 2022-04-16 DIAGNOSIS — F321 Major depressive disorder, single episode, moderate: Secondary | ICD-10-CM | POA: Diagnosis not present

## 2022-04-16 DIAGNOSIS — F4389 Other reactions to severe stress: Secondary | ICD-10-CM | POA: Diagnosis not present

## 2022-04-16 DIAGNOSIS — R69 Illness, unspecified: Secondary | ICD-10-CM | POA: Diagnosis not present

## 2022-04-17 ENCOUNTER — Ambulatory Visit: Payer: Medicare HMO | Admitting: Physician Assistant

## 2022-04-17 VITALS — BP 140/80 | HR 89 | Temp 97.7°F | Resp 20 | Ht 63.0 in | Wt 247.9 lb

## 2022-04-17 DIAGNOSIS — M25562 Pain in left knee: Secondary | ICD-10-CM

## 2022-04-17 DIAGNOSIS — I872 Venous insufficiency (chronic) (peripheral): Secondary | ICD-10-CM

## 2022-04-17 DIAGNOSIS — M25561 Pain in right knee: Secondary | ICD-10-CM | POA: Diagnosis not present

## 2022-04-17 NOTE — Progress Notes (Signed)
Office Note     CC:  follow up Requesting Provider:  Libby Maw,*  HPI: Kristi Hunter is a 81 y.o. (03-13-1941) female who presents for evaluation of bilateral lower extremity pain.  She was referred for evaluation of varicose veins however describes pain that only occurs at night that travels down the lateral portion of her thigh and lower leg and is symmetrical.  Pain is relieved when she raises the legs on her adjustable bed.  She is not bothered by lower extremity edema or varicose veins.  She also denies any pain in her feet at night.  She states she has arthritis in both knees hips and back.  She has worn compression in the past however she states she acquired an infection after the thigh-high compression dug into her skin.  She denies history of DVT, venous ulcerations, trauma, or prior vascular intervention.  She denies tobacco use.  She lives at [facility].   Past Medical History:  Diagnosis Date   Anemia    Anxiety    Arthritis    Back pain    Breast cancer (HCC)    Cancer ([place])    uterine   Chest pain    Chicken pox    Chronic kidney disease    Constipation    Emphysema of lung ([place])    Family history of breast cancer 02/14/2021   Glaucoma    Hypertension    Joint pain    Lower extremity edema    Lymphedema    Osteoarthritis    Personal history of malignant neoplasm of uterus 02/14/2021   Pre-diabetes    Prediabetes    Shortness of breath    Sleep apnea    Urine incontinence    Uterine cancer ([place])    UTI (urinary tract infection)    Vertigo    Vitamin B 12 deficiency    during pregnancy    Past Surgical History:  Procedure Laterality Date   ABDOMINAL HYSTERECTOMY  2011   BREAST LUMPECTOMY WITH RADIOACTIVE SEED LOCALIZATION Right 02/20/2021   Procedure: RIGHT BREAST LUMPECTOMY WITH RADIOACTIVE SEED LOCALIZATION;  Surgeon: Stark Klein, MD;  Location: [facility];  Service: General;  Laterality: Right;   REPLACEMENT TOTAL KNEE  Right 2012    Social History   Socioeconomic History   Marital status: Widowed    Spouse name: Not on file   Number of children: 3   Years of education: Not on file   Highest education level: Not on file  Occupational History   Occupation: Reitired Engineer, maintenance (IT)    Comment: Retired Engineer, maintenance (IT)  Tobacco Use   Smoking status: Former    Types: Cigarettes    Quit date: 08/12/1982    Years since quitting: 39.7    Passive exposure: Never   Smokeless tobacco: Never  Vaping Use   Vaping Use: Never used  Substance and Sexual Activity   Alcohol use: Yes    Alcohol/week: 1.0 standard drink of alcohol    Types: 1 Glasses of wine per week    Comment: every 1-2 wks.    Drug use: Never   Sexual activity: Not on file  Other Topics Concern   Not on file  Social History Narrative   Not on file   Social Determinants of Health   Financial Resource Strain: Low Risk  (10/04/2021)   Overall Financial Resource Strain (CARDIA)    Difficulty of Paying Living Expenses: Not hard at all  Food Insecurity: No Food Insecurity (08/17/2021)  Hunger Vital Sign    Worried About Running Out of Food in the Last Year: Never true    Ran Out of Food in the Last Year: Never true  Transportation Needs: No Transportation Needs (08/17/2021)   PRAPARE - Hydrologist (Medical): No    Lack of Transportation (Non-Medical): No  Physical Activity: Inactive (08/17/2021)   Exercise Vital Sign    Days of Exercise per Week: 0 days    Minutes of Exercise per Session: 0 min  Stress: No Stress Concern Present (08/17/2021)   [place]    Feeling of Stress : Not at all  Social Connections: Moderately Integrated (08/17/2021)   Social Connection and Isolation Panel [NHANES]    Frequency of Communication with Friends and Family: Twice a week    Frequency of Social Gatherings with Friends and Family: Twice a week    Attends Religious Services: More than 4  times per year    Active Member of [employer] or Organizations: Yes    Attends Archivist Meetings: More than 4 times per year    Marital Status: Widowed  Intimate Partner Violence: Not At Risk (08/17/2021)   Humiliation, Afraid, Rape, and Kick questionnaire    Fear of Current or Ex-Partner: No    Emotionally Abused: No    Physically Abused: No    Sexually Abused: No    Family History  Problem Relation Age of Onset   Heart attack Mother    Heart disease Mother    Hyperlipidemia Mother    Hypertension Mother    Stroke Mother    Miscarriages / Stillbirths Mother    Diabetes Mother    Thyroid disease Mother    Cancer Mother    Obesity Mother    Melanoma Mother        dx after 89, sun-exposed areas   Cancer Father        skin   Alcohol abuse Father    Asthma Father    COPD Father    Heart attack Father    Heart disease Father    Obesity Father    Melanoma Father        dx after 68, back   Alcohol abuse Brother    Arthritis Brother    Drug abuse Brother    Alcohol abuse Brother    Cancer Brother        ? esophageal, d. 40   Breast cancer Paternal Aunt 62   Arthritis Maternal Grandmother    Diabetes Maternal Grandmother    Hyperlipidemia Maternal Grandmother    Colon cancer Maternal Grandfather        dx 61, dx 3   Alcohol abuse Paternal Grandfather    Arthritis Paternal Grandfather    Cirrhosis Paternal Grandfather    Depression Daughter    Hypertension Daughter    Mental illness Daughter    Colon polyps Daughter    Depression Son     Current Outpatient Medications  Medication Sig Dispense Refill   alendronate (FOSAMAX) 70 MG tablet TAKE 1 TABLET (70 MG TOTAL) BY MOUTH EVERY 7 (SEVEN) DAYS. TAKE WITH A FULL GLASS OF WATER ON AN EMPTY STOMACH. 12 tablet 3   amoxicillin (AMOXIL) 875 MG tablet SMARTSIG:1 Tablet(s) By Mouth Every 12 Hours     aspirin 81 MG EC tablet Take 81 mg by mouth daily. Swallow whole.     buPROPion (WELLBUTRIN SR) 150 MG 12 hr tablet  Take 1 tablet (150 mg total) by mouth in the morning. 30 tablet 0   calcium carbonate (OS-CAL) 600 MG TABS tablet Take 1 tablet (600 mg total) by mouth 2 (two) times daily with a meal. 180 tablet 4   chlorthalidone (HYGROTON) 25 MG tablet Take 1 tablet (25 mg total) by mouth daily. 30 tablet 0   diclofenac Sodium (VOLTAREN) 1 % GEL Apply 2 g topically daily as needed (Osteoarthritis).     GEMTESA 75 MG TABS Take 1 tablet by mouth daily.     ketoconazole (NIZORAL) 2 % cream APPLY 1 APPLICATION TOPICALLY DAILY 15 g 0   Lactobacillus (PROBIOTIC ACIDOPHILUS PO) Take 1 capsule by mouth daily.     Melatonin 10 MG TABS Take 10 mg by mouth at bedtime as needed (sleep).     metFORMIN (GLUCOPHAGE) 500 MG tablet Take 2 tablets (1,000 mg total) by mouth daily with breakfast. 30 tablet 0   metroNIDAZOLE (METROGEL) 1 % gel Apply topically daily. (Patient taking differently: Apply 1 application  topically daily as needed (rosacea).) 60 g 1   Multiple Vitamin (MULTIVITAMIN) tablet Take 1 tablet by mouth daily.     naproxen sodium (ALEVE) 220 MG tablet Take 220 mg by mouth daily as needed.     omega-3 fish oil (MAXEPA) 1000 MG CAPS capsule Take 1 capsule by mouth daily.      tamoxifen (NOLVADEX) 20 MG tablet Take 20 mg by mouth daily.     Turmeric 500 MG CAPS Take 500 mg by mouth daily as needed (pain).     No current facility-administered medications for this visit.    Allergies  Allergen Reactions   Percocet [Oxycodone-Acetaminophen] Other (See Comments)    hallucinations     REVIEW OF SYSTEMS:   '[X]'$  denotes positive finding, '[ ]'$  denotes negative finding Cardiac  Comments:  Chest pain or chest pressure:    Shortness of breath upon exertion:    Short of breath when lying flat:    Irregular heart rhythm:        Vascular    Pain in calf, thigh, or hip brought on by ambulation:    Pain in feet at night that wakes you up from your sleep:     Blood clot in your veins:    Leg swelling:          Pulmonary    Oxygen at home:    Productive cough:     Wheezing:         Neurologic    Sudden weakness in arms or legs:     Sudden numbness in arms or legs:     Sudden onset of difficulty speaking or slurred speech:    Temporary loss of vision in one eye:     Problems with dizziness:         Gastrointestinal    Blood in stool:     Vomited blood:         Genitourinary    Burning when urinating:     Blood in urine:        Psychiatric    Major depression:         Hematologic    Bleeding problems:    Problems with blood clotting too easily:        Skin    Rashes or ulcers:        Constitutional    Fever or chills:      PHYSICAL EXAMINATION:  Vitals:   04/17/22 0908  BP: Marland Kitchen)  140/80  Pulse: 89  Resp: 20  Temp: 97.7 F (36.5 C)  TempSrc: Temporal  SpO2: 93%  Weight: 247 lb 14.4 oz (112.4 kg)  Height: '5\' 3"'$  (1.6 m)    General:  WDWN in NAD; vital signs documented above Gait: Not observed HENT: WNL, normocephalic Pulmonary: normal non-labored breathing , without Rales, rhonchi,  wheezing Cardiac: regular HR Abdomen: soft, NT, no masses Skin: without rashes Vascular Exam/Pulses:  Right Left  Radial 2+ (normal) 2+ (normal)  DP 2+ (normal) 2+ (normal)   Extremities: without ischemic changes, without Gangrene , without cellulitis; without open wounds;  Musculoskeletal: no muscle wasting or atrophy  Neurologic: A&O X 3;  No focal weakness or paresthesias are detected Psychiatric:  The pt has Normal affect.   Non-Invasive Vascular Imaging:   Right lower extremity venous reflux study negative for DVT Incompetent right common femoral vein Incompetent GSV with diameter greater than 4 mm    ASSESSMENT/PLAN:: 81 y.o. female here for evaluation of bilateral lower leg pain  -[name] is a 81 year old female who is experiencing pain symmetrically in bilateral lower extremities only at night.  She denies any discomfort associated with bilateral lower  extremity edema or varicose veins.  Pain is relieved with elevation of the foot of her adjustable bed.  Given the symmetry and nature of symptoms especially at night when laying flat, etiology is more likely related to lumbar spine stenosis and less likely related to venous insufficiency.  Right lower extremity venous reflux study was negative for DVT.  She does have some mild deep and superficial venous reflux however this would not explain her symptoms.  Bilateral lower extremities are also well perfused with palpable DP pulses.  She will be referred back to her PCP for management of neuropathy possibly related to lumbar spine stenosis.  She may also be tried on Neurontin for symptom relief especially at night.  For now she will follow-up on an as-needed basis.   Dagoberto Ligas, PA-C [facility] [phone]  Clinic MD:   Donzetta Matters

## 2022-04-18 DIAGNOSIS — R2681 Unsteadiness on feet: Secondary | ICD-10-CM | POA: Diagnosis not present

## 2022-04-18 DIAGNOSIS — M6281 Muscle weakness (generalized): Secondary | ICD-10-CM | POA: Diagnosis not present

## 2022-04-18 DIAGNOSIS — M25551 Pain in right hip: Secondary | ICD-10-CM | POA: Diagnosis not present

## 2022-04-18 DIAGNOSIS — M25562 Pain in left knee: Secondary | ICD-10-CM | POA: Diagnosis not present

## 2022-04-18 DIAGNOSIS — R2689 Other abnormalities of gait and mobility: Secondary | ICD-10-CM | POA: Diagnosis not present

## 2022-04-19 ENCOUNTER — Telehealth: Payer: Self-pay

## 2022-04-19 NOTE — Progress Notes (Signed)
Chronic Care Management Pharmacy Assistant   Name: Kristi Hunter  MRN: [id number] DOB: May 29, 1941   Reason for Encounter: Hypertension Disease State Call.   Recent office visits:  03/15/2022 Dr. [name] MD (PCP) No medication Changes noted, Ambulatory referral to Vascular Surgery, Return in about 3 months   Recent consult visits:  04/17/2022 Dagoberto Ligas PA-C (Vascular Surgery) No Medication Changes noted 04/10/2022 Dr. Antony Salmon MD [facility]) No medication Changes noted 04/01/2022  Dr. Georgina Snell MD (Sports Medicine) patient reports she taking Chlorthalidone 25 mg daily 03/20/2022 Dr. Leafy Ro MD (Weight management) No Medication Changes noted 02/26/2022 Unable to see note 02/20/2022  Dr. Leafy Ro MD (Weight Management)Decrease Chlorthalidone 15 mg, Increase Metformin to 1,000 mg daily 02/18/2022 Dr. Georgina Snell MD (Sports Medicine) No Medication Changes noted 02/07/2022 Unable to see note 02/05/2022 Unable to see note 01/31/2022 Unable to see note 01/29/2022 Unable to see note 01/24/2022 Unable to see note 01/21/2022 Unable to see note 01/17/2022 Unable to see note 01/14/2022 Unable to see note 01/08/2022 Louis Meckel (Urology) Unable to see note 12/31/2021 Dr. Georgina Snell MD (Sports Medicine) No Medication Changes noted, Ambulatory referral to Physical Therapy 12/25/2021 Dr. Leafy Ro MD (Weight Management) No Medication Changes noted 12/11/2021 Dr. Chryl Heck MD (Oncology) No medication Changes noted, Return in 6 months 11/13/2021 Louis Meckel (Urology) Unable to see note 10/25/2021 Jacalyn Lefevre (Urology) Unable to see note 10/09/2021 Dr. Leafy Ro MD (Weight Management) No Medication Changes noted  Hospital visits:  None in previous 6 months  Medications: Outpatient Encounter Medications as of 04/19/2022  Medication Sig   alendronate (FOSAMAX) 70 MG tablet TAKE 1 TABLET (70 MG TOTAL) BY MOUTH EVERY 7 (SEVEN) DAYS. TAKE WITH A FULL GLASS OF WATER ON AN EMPTY STOMACH.   amoxicillin  (AMOXIL) 875 MG tablet SMARTSIG:1 Tablet(s) By Mouth Every 12 Hours   aspirin 81 MG EC tablet Take 81 mg by mouth daily. Swallow whole.   buPROPion (WELLBUTRIN SR) 150 MG 12 hr tablet Take 1 tablet (150 mg total) by mouth in the morning.   calcium carbonate (OS-CAL) 600 MG TABS tablet Take 1 tablet (600 mg total) by mouth 2 (two) times daily with a meal.   chlorthalidone (HYGROTON) 25 MG tablet Take 1 tablet (25 mg total) by mouth daily.   diclofenac Sodium (VOLTAREN) 1 % GEL Apply 2 g topically daily as needed (Osteoarthritis).   GEMTESA 75 MG TABS Take 1 tablet by mouth daily.   ketoconazole (NIZORAL) 2 % cream APPLY 1 APPLICATION TOPICALLY DAILY   Lactobacillus (PROBIOTIC ACIDOPHILUS PO) Take 1 capsule by mouth daily.   Melatonin 10 MG TABS Take 10 mg by mouth at bedtime as needed (sleep).   metFORMIN (GLUCOPHAGE) 500 MG tablet Take 2 tablets (1,000 mg total) by mouth daily with breakfast.   metroNIDAZOLE (METROGEL) 1 % gel Apply topically daily. (Patient taking differently: Apply 1 application  topically daily as needed (rosacea).)   Multiple Vitamin (MULTIVITAMIN) tablet Take 1 tablet by mouth daily.   naproxen sodium (ALEVE) 220 MG tablet Take 220 mg by mouth daily as needed.   omega-3 fish oil (MAXEPA) 1000 MG CAPS capsule Take 1 capsule by mouth daily.    tamoxifen (NOLVADEX) 20 MG tablet Take 20 mg by mouth daily.   Turmeric 500 MG CAPS Take 500 mg by mouth daily as needed (pain).   No facility-administered encounter medications on file as of 04/19/2022.    Care Gaps: Hepatitis C Screening Urine Microalbumin Influenza Vaccine  Star Rating Drugs: Metformin 500 mg last filled 04/16/2022  15 day supply at [facility].  Reviewed chart prior to disease state call. Spoke with patient regarding BP  Recent Office Vitals: BP Readings from Last 3 Encounters:  04/17/22 (!) 140/80  04/01/22 (!) 142/78  03/20/22 121/77   Pulse Readings from Last 3 Encounters:  04/17/22 89  04/01/22  91  03/20/22 83    Wt Readings from Last 3 Encounters:  04/17/22 247 lb 14.4 oz (112.4 kg)  04/01/22 255 lb 9.6 oz (115.9 kg)  03/20/22 250 lb (113.4 kg)     Kidney Function Lab Results  Component Value Date/Time   CREATININE 0.71 03/15/2022 11:49 AM   CREATININE 0.60 02/20/2022 09:42 AM   CREATININE 0.76 12/11/2021 12:26 PM   CREATININE 0.71 02/14/2021 08:26 AM   GFR 81.33 03/15/2022 11:49 AM   GFRNONAA >60 12/11/2021 12:26 PM   GFRAA 104 02/22/2020 01:39 PM       Latest Ref Rng & Units 03/15/2022   11:49 AM 02/20/2022    9:42 AM 12/11/2021   12:26 PM  BMP  Glucose 70 - 99 mg/dL 101  88  103   BUN 6 - 23 mg/dL '18  15  21   '$ Creatinine 0.40 - 1.20 mg/dL 0.71  0.60  0.76   BUN/Creat Ratio 12 - 28  25    Sodium 135 - 145 mEq/L 139  140  137   Potassium 3.5 - 5.1 mEq/L 3.5  4.1  3.7   Chloride 96 - 112 mEq/L 100  98  103   CO2 19 - 32 mEq/L 33  28  30   Calcium 8.4 - 10.5 mg/dL 8.6  9.4  9.0     Current antihypertensive regimen:  Chlorthalidone 25 mg daily  Patient states she has switch her Primary care to a different office, and no longer is a patient at [facility]. Notified Clinical pharmacist.   Adherence Review: Is the patient currently on ACE/ARB medication? No Does the patient have >5 day gap between last estimated fill dates? No   Anderson [place] Clinical Production designer, theatre/television/film [phone]

## 2022-04-23 ENCOUNTER — Ambulatory Visit (INDEPENDENT_AMBULATORY_CARE_PROVIDER_SITE_OTHER): Payer: Medicare HMO | Admitting: Family Medicine

## 2022-04-23 ENCOUNTER — Encounter (INDEPENDENT_AMBULATORY_CARE_PROVIDER_SITE_OTHER): Payer: Self-pay | Admitting: Family Medicine

## 2022-04-23 VITALS — BP 124/81 | HR 80 | Temp 97.4°F | Ht 63.0 in | Wt 240.0 lb

## 2022-04-23 DIAGNOSIS — E669 Obesity, unspecified: Secondary | ICD-10-CM

## 2022-04-23 DIAGNOSIS — F3289 Other specified depressive episodes: Secondary | ICD-10-CM | POA: Diagnosis not present

## 2022-04-23 DIAGNOSIS — Z6841 Body Mass Index (BMI) 40.0 and over, adult: Secondary | ICD-10-CM

## 2022-04-23 DIAGNOSIS — R7303 Prediabetes: Secondary | ICD-10-CM | POA: Diagnosis not present

## 2022-04-23 DIAGNOSIS — R69 Illness, unspecified: Secondary | ICD-10-CM | POA: Diagnosis not present

## 2022-04-23 DIAGNOSIS — F32A Depression, unspecified: Secondary | ICD-10-CM | POA: Insufficient documentation

## 2022-04-23 MED ORDER — BUPROPION HCL ER (SR) 150 MG PO TB12: 150.0000 mg | ORAL_TABLET | Freq: Every morning | ORAL | 0 refills | Status: DC

## 2022-04-23 MED ORDER — METFORMIN HCL 500 MG PO TABS: 1000.0000 mg | ORAL_TABLET | Freq: Every day | ORAL | 0 refills | Status: DC

## 2022-04-25 NOTE — Telephone Encounter (Signed)
No entry 

## 2022-04-29 NOTE — Progress Notes (Unsigned)
Chief Complaint:   OBESITY Kristi Hunter is here to discuss her progress with her obesity treatment plan along with follow-up of her obesity related diagnoses. Kristi Hunter is on practicing portion control and making smarter food choices, such as increasing vegetables and decreasing simple carbohydrates and states she is following her eating plan approximately 70% of the time. Kristi Hunter states she is doing 0 minutes 0 times per week.  Today's visit was #: 12 Starting weight: 267 lbs Starting date: 02/22/2020 Today's weight: 240 lbs Today's date: 04/23/2022 Total lbs lost to date: 27 Total lbs lost since last in-office visit: 10  Interim History: Kristi Hunter has been sick recently after having a root canal and she was given amoxicillin, and she had a lot of nausea and vomiting.  She is working on increasing hydration.  Subjective:   1. Pre-diabetes Kristi Hunter's recent A1c was 5.9, with no side effects with metformin.   2. Other depression with emotional eating Kristi Hunter's mood is stable on her medications. She is working on decreasing emotional eating behaviors and with no side effects.   Assessment/Plan:   1. Pre-diabetes We will refill metformin for 1 month. Leidy will continue to work on weight loss, exercise, and decreasing simple carbohydrates to help decrease the risk of diabetes.   - metFORMIN (GLUCOPHAGE) 500 MG tablet; Take 2 tablets (1,000 mg total) by mouth daily with breakfast.  Dispense: 60 tablet; Refill: 0  2. Other depression with emotional eating [name] will continue Wellbutrin SR 150 mg q AM, and we will refill for 1 month.   - buPROPion (WELLBUTRIN SR) 150 MG 12 hr tablet; Take 1 tablet (150 mg total) by mouth in the morning.  Dispense: 30 tablet; Refill: 0  3. Obesity, Current BMI 42.7 Kristi Hunter is currently in the action stage of change. As such, her goal is to continue with weight loss efforts. She has agreed to practicing portion control and making smarter  food choices, such as increasing vegetables and decreasing simple carbohydrates.   Kristi Hunter was given multiple eating plans, but she lives at [place] and eats what they have.  Behavioral modification strategies: increasing lean protein intake and increasing vegetables.  Kristi Hunter has agreed to follow-up with our clinic in 4 weeks. She was informed of the importance of frequent follow-up visits to maximize her success with intensive lifestyle modifications for her multiple health conditions.   Objective:   Blood pressure 124/81, pulse 80, temperature (!) 97.4 F (36.3 C), height '5\' 3"'$  (1.6 m), weight 240 lb (108.9 kg), SpO2 97 %. Body mass index is 42.51 kg/m.  General: Cooperative, alert, well developed, in no acute distress. HEENT: Conjunctivae and lids unremarkable. Cardiovascular: Regular rhythm.  Lungs: Normal work of breathing. Neurologic: No focal deficits.   Lab Results  Component Value Date   CREATININE 0.71 03/15/2022   BUN 18 03/15/2022   NA 139 03/15/2022   K 3.5 03/15/2022   CL 100 03/15/2022   CO2 33 (H) 03/15/2022   Lab Results  Component Value Date   ALT 17 03/15/2022   AST 19 03/15/2022   ALKPHOS 43 03/15/2022   BILITOT 0.3 03/15/2022   Lab Results  Component Value Date   HGBA1C 5.9 (H) 02/20/2022   HGBA1C 5.8 (H) 12/25/2021   HGBA1C 5.7 (H) 04/12/2021   HGBA1C 5.8 (H) 02/19/2021   HGBA1C 5.9 (H) 02/22/2020   Lab Results  Component Value Date   INSULIN 22.5 02/20/2022   INSULIN 37.9 (H) 04/12/2021   INSULIN 21.5 02/22/2020  Lab Results  Component Value Date   TSH 1.470 02/22/2020   Lab Results  Component Value Date   CHOL 159 02/20/2022   HDL 49 02/20/2022   LDLCALC 98 02/20/2022   TRIG 58 02/20/2022   CHOLHDL 3 01/13/2019   Lab Results  Component Value Date   VD25OH 32.7 02/20/2022   VD25OH 35.1 12/25/2021   VD25OH 46.8 04/12/2021   Lab Results  Component Value Date   WBC 7.6 02/20/2022   HGB 12.6 02/20/2022   HCT 39.6  02/20/2022   MCV 87 02/20/2022   PLT 318 02/20/2022   No results found for: "IRON", "TIBC", "FERRITIN"  Obesity Behavioral Intervention:   Approximately 15 minutes were spent on the discussion below.  ASK: We discussed the diagnosis of obesity with Kristi Hunter today and Kristi Hunter agreed to give [name] permission to discuss obesity behavioral modification therapy today.  ASSESS: Kristi Hunter has the diagnosis of obesity and her BMI today is 42.7. Kristi Hunter is in the action stage of change.   ADVISE: Kristi Hunter was educated on the multiple health risks of obesity as well as the benefit of weight loss to improve her health. She was advised of the need for long term treatment and the importance of lifestyle modifications to improve her current health and to decrease her risk of future health problems.  AGREE: Multiple dietary modification options and treatment options were discussed and Kristi Hunter agreed to follow the recommendations documented in the above note.  ARRANGE: Kristi Hunter was educated on the importance of frequent visits to treat obesity as outlined per CMS and USPSTF guidelines and agreed to schedule her next follow up appointment today.  Attestation Statements:   Reviewed by clinician on day of visit: allergies, medications, problem list, medical history, surgical history, family history, social history, and previous encounter notes.   I, Trixie Dredge, am acting as transcriptionist for Kristi Nip, MD.  I have reviewed the above documentation for accuracy and completeness, and I agree with the above. -  Kristi Nip, MD

## 2022-04-30 ENCOUNTER — Other Ambulatory Visit (INDEPENDENT_AMBULATORY_CARE_PROVIDER_SITE_OTHER): Payer: Self-pay | Admitting: Family Medicine

## 2022-04-30 DIAGNOSIS — R7303 Prediabetes: Secondary | ICD-10-CM

## 2022-05-01 DIAGNOSIS — N3946 Mixed incontinence: Secondary | ICD-10-CM | POA: Diagnosis not present

## 2022-05-01 DIAGNOSIS — R7303 Prediabetes: Secondary | ICD-10-CM | POA: Diagnosis not present

## 2022-05-01 DIAGNOSIS — I10 Essential (primary) hypertension: Secondary | ICD-10-CM | POA: Diagnosis not present

## 2022-05-01 DIAGNOSIS — R69 Illness, unspecified: Secondary | ICD-10-CM | POA: Diagnosis not present

## 2022-05-01 DIAGNOSIS — K219 Gastro-esophageal reflux disease without esophagitis: Secondary | ICD-10-CM | POA: Diagnosis not present

## 2022-05-02 DIAGNOSIS — R350 Frequency of micturition: Secondary | ICD-10-CM | POA: Diagnosis not present

## 2022-05-07 DIAGNOSIS — R69 Illness, unspecified: Secondary | ICD-10-CM | POA: Diagnosis not present

## 2022-05-07 DIAGNOSIS — F321 Major depressive disorder, single episode, moderate: Secondary | ICD-10-CM | POA: Diagnosis not present

## 2022-05-07 DIAGNOSIS — F4389 Other reactions to severe stress: Secondary | ICD-10-CM | POA: Diagnosis not present

## 2022-05-08 DIAGNOSIS — N3946 Mixed incontinence: Secondary | ICD-10-CM | POA: Diagnosis not present

## 2022-05-14 DIAGNOSIS — F4389 Other reactions to severe stress: Secondary | ICD-10-CM | POA: Diagnosis not present

## 2022-05-14 DIAGNOSIS — F321 Major depressive disorder, single episode, moderate: Secondary | ICD-10-CM | POA: Diagnosis not present

## 2022-05-14 DIAGNOSIS — R69 Illness, unspecified: Secondary | ICD-10-CM | POA: Diagnosis not present

## 2022-05-15 ENCOUNTER — Other Ambulatory Visit (INDEPENDENT_AMBULATORY_CARE_PROVIDER_SITE_OTHER): Payer: Self-pay | Admitting: Family Medicine

## 2022-05-15 DIAGNOSIS — F3289 Other specified depressive episodes: Secondary | ICD-10-CM

## 2022-05-20 ENCOUNTER — Ambulatory Visit (INDEPENDENT_AMBULATORY_CARE_PROVIDER_SITE_OTHER): Payer: Medicare HMO | Admitting: Family Medicine

## 2022-05-21 ENCOUNTER — Other Ambulatory Visit (INDEPENDENT_AMBULATORY_CARE_PROVIDER_SITE_OTHER): Payer: Self-pay | Admitting: Family Medicine

## 2022-05-21 DIAGNOSIS — F3289 Other specified depressive episodes: Secondary | ICD-10-CM

## 2022-05-23 ENCOUNTER — Other Ambulatory Visit (INDEPENDENT_AMBULATORY_CARE_PROVIDER_SITE_OTHER): Payer: Self-pay | Admitting: Family Medicine

## 2022-05-23 DIAGNOSIS — R7303 Prediabetes: Secondary | ICD-10-CM

## 2022-05-25 ENCOUNTER — Encounter: Payer: Self-pay | Admitting: Hematology and Oncology

## 2022-05-27 ENCOUNTER — Other Ambulatory Visit: Payer: Self-pay | Admitting: *Deleted

## 2022-05-27 MED ORDER — TAMOXIFEN CITRATE 20 MG PO TABS: 20.0000 mg | ORAL_TABLET | Freq: Every day | ORAL | 3 refills | Status: DC

## 2022-05-29 ENCOUNTER — Encounter (INDEPENDENT_AMBULATORY_CARE_PROVIDER_SITE_OTHER): Payer: Self-pay | Admitting: Family Medicine

## 2022-05-29 ENCOUNTER — Ambulatory Visit (INDEPENDENT_AMBULATORY_CARE_PROVIDER_SITE_OTHER): Payer: Medicare HMO | Admitting: Family Medicine

## 2022-05-29 VITALS — BP 137/82 | HR 89 | Temp 97.5°F | Ht 63.0 in | Wt 246.0 lb

## 2022-05-29 DIAGNOSIS — N3941 Urge incontinence: Secondary | ICD-10-CM | POA: Insufficient documentation

## 2022-05-29 DIAGNOSIS — E669 Obesity, unspecified: Secondary | ICD-10-CM

## 2022-05-29 DIAGNOSIS — R7303 Prediabetes: Secondary | ICD-10-CM

## 2022-05-29 DIAGNOSIS — E860 Dehydration: Secondary | ICD-10-CM | POA: Insufficient documentation

## 2022-05-29 DIAGNOSIS — Z6841 Body Mass Index (BMI) 40.0 and over, adult: Secondary | ICD-10-CM | POA: Diagnosis not present

## 2022-05-29 MED ORDER — METFORMIN HCL 500 MG PO TABS: 1000.0000 mg | ORAL_TABLET | Freq: Every day | ORAL | 0 refills | Status: DC

## 2022-05-29 NOTE — Progress Notes (Signed)
  Office: [phone]  /  Fax: [phone]       BP 137/82   Pulse 89   Temp (!) 97.5 F (36.4 C)   Ht '5\' 3"'$  (1.6 m)   Wt 246 lb (111.6 kg)   SpO2 98%   BMI 43.58 kg/m  She was weighed on the bioimpedance scale:  Body mass index is 43.58 kg/m.  General:  Alert, oriented and cooperative. Patient is in no acute distress.  Mental Status: Normal mood and affect. Normal behavior. Normal judgment and thought content.        Patient past medical history includes:   Past Medical History:  Diagnosis Date   Anemia    Anxiety    Arthritis    Back pain    Breast cancer ([place])    Cancer ([place])    uterine   Chest pain    Chicken pox    Chronic kidney disease    Constipation    Emphysema of lung ([facility])    Family history of breast cancer 02/14/2021   Glaucoma    Hypertension    Joint pain    Lower extremity edema    Lymphedema    Osteoarthritis    Personal history of malignant neoplasm of uterus 02/14/2021   Pre-diabetes    Prediabetes    Shortness of breath    Sleep apnea    Urine incontinence    Uterine cancer ([place])    UTI (urinary tract infection)    Vertigo    Vitamin B 12 deficiency    during pregnancy    History of Present Illness The patient reports a recent weight loss of 10 pounds due to illness, followed by a regain of 4 pounds. They are retaining 2 pounds of water. The patient has been working on improving their hydration, as they previously experienced some dryness and dehydration. They have not experienced any changes in their medication regimen since their last visit.  The patient has been taking Gemtesa for urge incontinence and has found it to be effective in reducing the frequency of nighttime bathroom visits. They report that they were previously getting up every 2.5 hours during the night, but now they are able to sleep for 4 to 4.5 hours at least once during the night. The patient has not been experiencing many accidents during the day, but they have  noticed that when they get up at night, they sometimes have difficulty controlling their bladder.  The patient is also taking metformin for prediabetes, with their last A1C level at 5.9. They have recently requested a prescription renewal for their cancer medication through the messenger system. The patient is considering trying new diet pills that are not covered by their insurance and plans to request a prior authorization for these medications in the new year.  In addition to their current medications, the patient is participating in an exercise program and plans to continue with this regimen. They are scheduled for a follow-up appointment in four weeks to monitor their progress and address any concerns.  Assessment & Plan obesity: Patient has experienced weight fluctuations due to illness and water retention. -Encouraged continued hydration and adherence to new diet plan.  Urge Incontinence: Improvement in nocturnal symptoms with Gemtessa. -Continue Gemtessa as it is helping with nocturia and improving sleep quality.  Prediabetes: Patient is on Metformin and recent HbA1c was 5.9. -Continue Metformin as prescribed.  Follow-up in 4 weeks.  [name], MD

## 2022-06-04 DIAGNOSIS — F4389 Other reactions to severe stress: Secondary | ICD-10-CM | POA: Diagnosis not present

## 2022-06-04 DIAGNOSIS — R69 Illness, unspecified: Secondary | ICD-10-CM | POA: Diagnosis not present

## 2022-06-04 DIAGNOSIS — F321 Major depressive disorder, single episode, moderate: Secondary | ICD-10-CM | POA: Diagnosis not present

## 2022-06-06 DIAGNOSIS — R350 Frequency of micturition: Secondary | ICD-10-CM | POA: Diagnosis not present

## 2022-06-11 ENCOUNTER — Other Ambulatory Visit (INDEPENDENT_AMBULATORY_CARE_PROVIDER_SITE_OTHER): Payer: Self-pay | Admitting: Family Medicine

## 2022-06-11 DIAGNOSIS — R7303 Prediabetes: Secondary | ICD-10-CM

## 2022-06-18 ENCOUNTER — Inpatient Hospital Stay: Payer: Medicare HMO | Attending: Hematology and Oncology | Admitting: Hematology and Oncology

## 2022-06-18 ENCOUNTER — Other Ambulatory Visit: Payer: Self-pay

## 2022-06-18 ENCOUNTER — Encounter: Payer: Self-pay | Admitting: Hematology and Oncology

## 2022-06-18 ENCOUNTER — Ambulatory Visit: Payer: Medicare HMO | Admitting: Family Medicine

## 2022-06-18 VITALS — BP 116/79 | HR 93 | Temp 98.7°F | Wt 254.4 lb

## 2022-06-18 DIAGNOSIS — Z8542 Personal history of malignant neoplasm of other parts of uterus: Secondary | ICD-10-CM | POA: Diagnosis not present

## 2022-06-18 DIAGNOSIS — Z79810 Long term (current) use of selective estrogen receptor modulators (SERMs): Secondary | ICD-10-CM | POA: Insufficient documentation

## 2022-06-18 DIAGNOSIS — I129 Hypertensive chronic kidney disease with stage 1 through stage 4 chronic kidney disease, or unspecified chronic kidney disease: Secondary | ICD-10-CM | POA: Diagnosis not present

## 2022-06-18 DIAGNOSIS — F321 Major depressive disorder, single episode, moderate: Secondary | ICD-10-CM | POA: Diagnosis not present

## 2022-06-18 DIAGNOSIS — C50411 Malignant neoplasm of upper-outer quadrant of right female breast: Secondary | ICD-10-CM | POA: Insufficient documentation

## 2022-06-18 DIAGNOSIS — Z803 Family history of malignant neoplasm of breast: Secondary | ICD-10-CM | POA: Diagnosis not present

## 2022-06-18 DIAGNOSIS — N189 Chronic kidney disease, unspecified: Secondary | ICD-10-CM | POA: Insufficient documentation

## 2022-06-18 DIAGNOSIS — Z90710 Acquired absence of both cervix and uterus: Secondary | ICD-10-CM | POA: Insufficient documentation

## 2022-06-18 DIAGNOSIS — R69 Illness, unspecified: Secondary | ICD-10-CM | POA: Diagnosis not present

## 2022-06-18 DIAGNOSIS — Z170 Estrogen receptor positive status [ER+]: Secondary | ICD-10-CM | POA: Insufficient documentation

## 2022-06-18 DIAGNOSIS — Z800 Family history of malignant neoplasm of digestive organs: Secondary | ICD-10-CM | POA: Diagnosis not present

## 2022-06-18 DIAGNOSIS — Z87891 Personal history of nicotine dependence: Secondary | ICD-10-CM | POA: Insufficient documentation

## 2022-06-18 DIAGNOSIS — F4389 Other reactions to severe stress: Secondary | ICD-10-CM | POA: Diagnosis not present

## 2022-06-18 NOTE — Progress Notes (Signed)
SURVIVORSHIP VISIT:   BRIEF ONCOLOGIC HISTORY:  Oncology History  Malignant neoplasm of upper-outer quadrant of right breast in female, estrogen receptor positive ([place])  02/01/2021 Imaging   EXAM: ULTRASOUND OF THE RIGHT BREAST  FINDINGS: Targeted ultrasound is performed, showing a 10 x 7 x 8 mm irregular hypoechoic mass right breast 10 o'clock position 1 cm from the nipple. No right axillary adenopathy.   IMPRESSION: Suspicious right breast mass 10 o'clock position.   02/05/2021 Initial Biopsy   Diagnosis Breast, right, needle core biopsy, 10 o'clock, 1cmfn - INVASIVE DUCTAL CARCINOMA WITH CALCIFICATIONS. SEE NOTE - DUCTAL CARCINOMA IN SITU, INTERMEDIATE GRADE Diagnosis Note Carcinoma measures 0.7 cm in greatest linear dimension and appears grade 2.  PROGNOSTIC INDICATORS Results: The tumor cells are EQUIVOCAL for Her2 (2+). Her2 by FISH will be performed and results reported separately. Estrogen Receptor: 100%, POSITIVE, STRONG STAINING INTENSITY Progesterone Receptor: 90%, POSITIVE, STRONG STAINING INTENSITY Proliferation Marker Ki67: 20%  FLUORESCENCE IN-SITU HYBRIDIZATION Results: GROUP 5: HER2 **NEGATIVE**   02/08/2021 Initial Diagnosis   Malignant neoplasm of upper-outer quadrant of right breast in female, estrogen receptor positive ([place])   02/14/2021 Cancer Staging   Staging form: Breast, AJCC 8th Edition - Clinical stage from 02/14/2021: Stage IA (cT1b, cN0, cM0, G2, ER+, PR+, HER2-) - Signed by Chauncey Cruel, MD on 02/14/2021 Stage prefix: Initial diagnosis Histologic grading system: 3 grade system   02/20/2021 Cancer Staging   Staging form: Breast, AJCC 8th Edition - Pathologic stage from 02/20/2021: Stage IA (pT1c, pN0, cM0, G2, ER+, PR+, HER2-) - Signed by Gardenia Phlegm, NP on 08/07/2021 Histologic grading system: 3 grade system   02/20/2021 Definitive Surgery   FINAL MICROSCOPIC DIAGNOSIS:   A. BREAST, RIGHT, LUMPECTOMY:  - Invasive and in situ  ductal carcinoma, 1.2 cm.  - Invasive carcinoma focally 0.1 cm from medial margin and 0.2 cm from anterior margin.  - Biopsy site and biopsy clip.  - See oncology table.    02/20/2021 Oncotype testing   Oncotype DX was obtained on the final surgical sample and the recurrence score of 8 predicts a risk of recurrence outside the breast over the next 9 years of 3%, if the patient's only systemic therapy is an antiestrogen for 5 years.  It also predicts no benefit from chemotherapy.    02/23/2021 Genetic Testing   Negative hereditary cancer genetic testing: no pathogenic variants detected in [facility] CustomNext-Cancer +RNAinsight Panel.  The report date is February 23, 2021.    The CustomNext-Cancer +RNAinsight Panel offered by [facility] and includes sequencing and rearrangement analysis for the following 91 genes: AIP, ALK, APC, ATM, AXIN2, BAP1, BARD1, BLM, BMPR1A, BRCA1, BRCA2, BRIP1, CDC73, CDH1, CDK4, CDKN1B, CDKN2A, CHEK2, CTNNA1, DICER1, FANCC, FH, FLCN, GALNT12, KIF1B, LZTR1, MAX, MEN1, MET, MLH1, MRE11A, MSH2, MSH3, MSH6, MUTYH, NBN, NF1, NF2, NTHL1, PALB2, PHOX2B, PMS2, POT1, PRKAR1A, PTCH1, PTEN, RAD50, RAD51C, RAD51D, RB1, RECQL, RET, SDHA, SDHAF2, SDHB, SDHC, SDHD, SMAD4, SMARCA4, SMARCB1, SMARCE1, STK11, SUFU, TMEM127, TP53, TSC1, TSC2, VHL and XRCC2 (sequencing and deletion/duplication); CASR, CFTR, CPA1, CTRC, EGFR, EGLN1, FAM175A, HOXB13, KIT, MITF, MLH3, PALLD, PDGFRA, POLD1, POLE, PRSS1, RINT1, RPS20, SPINK1 and TERT (sequencing only); EPCAM and GREM1 (deletion/duplication only). RNA data is routinely analyzed for use in variant interpretation for all genes.   05/02/2021 - 04/2026 Anti-estrogen oral therapy   tamoxifen started 05/02/2021             (A) s/p remote hysterectomy (for uterine cancer)             (  B) osteoporosis with bone density 2020 T score -2.6     INTERVAL HISTORY:   Ms. Molinari is here for follow-up.   She had mammogram in [place] in July, repeat diagnostic  mammogram recommended in 6 months. She also had a bone density done recently, osteoporosis continues, takes fosamax. She recently changed her PCP for convenience she says. She hasn't noticed any changes in the breast She is trying to lose weight but it seems like an impossible task. Rest of the pertinent 10 point ROS reviewed and negative  REVIEW OF SYSTEMS:  Review of Systems  Constitutional:  Negative for appetite change, chills, fatigue, fever and unexpected weight change.  HENT:   Negative for hearing loss, lump/mass, mouth sores and trouble swallowing.   Eyes:  Negative for eye problems and icterus.  Respiratory:  Negative for chest tightness, cough and shortness of breath.   Cardiovascular:  Negative for chest pain, leg swelling and palpitations.  Gastrointestinal:  Positive for constipation. Negative for abdominal distention, abdominal pain, diarrhea, nausea and vomiting.  Endocrine: Negative for hot flashes.  Genitourinary:  Negative for difficulty urinating.   Musculoskeletal:  Negative for arthralgias.  Skin:  Negative for itching and rash.  Neurological:  Negative for dizziness, extremity weakness, headaches and numbness.  Hematological:  Negative for adenopathy. Does not bruise/bleed easily.  Psychiatric/Behavioral:  Negative for depression. The patient is not nervous/anxious.    Breast: Denies any new nodularity, masses, tenderness, nipple changes, or nipple discharge.      ONCOLOGY TREATMENT TEAM:  1. Surgeon:  Dr. Barry Dienes at [facility] 2. Medical Oncologist: Dr. Chryl Heck  3. Radiation Oncologist: Dr. Lisbeth Renshaw    PAST MEDICAL/SURGICAL HISTORY:  Past Medical History:  Diagnosis Date   Anemia    Anxiety    Arthritis    Back pain    Breast cancer ([place])    Cancer ([place])    uterine   Chest pain    Chicken pox    Chronic kidney disease    Constipation    Emphysema of lung ([place])    Family history of breast cancer 02/14/2021   Glaucoma    Hypertension     Joint pain    Lower extremity edema    Lymphedema    Osteoarthritis    Personal history of malignant neoplasm of uterus 02/14/2021   Pre-diabetes    Prediabetes    Shortness of breath    Sleep apnea    Urine incontinence    Uterine cancer ([place])    UTI (urinary tract infection)    Vertigo    Vitamin B 12 deficiency    during pregnancy   Past Surgical History:  Procedure Laterality Date   ABDOMINAL HYSTERECTOMY  2011   BREAST LUMPECTOMY WITH RADIOACTIVE SEED LOCALIZATION Right 02/20/2021   Procedure: RIGHT BREAST LUMPECTOMY WITH RADIOACTIVE SEED LOCALIZATION;  Surgeon: Stark Klein, MD;  Location: [facility];  Service: General;  Laterality: Right;   REPLACEMENT TOTAL KNEE Right 2012     ALLERGIES:  Allergies  Allergen Reactions   Amoxicillin Nausea And Vomiting   Percocet [Oxycodone-Acetaminophen] Other (See Comments)    hallucinations     CURRENT MEDICATIONS:  Outpatient Encounter Medications as of 06/18/2022  Medication Sig   alendronate (FOSAMAX) 70 MG tablet TAKE 1 TABLET (70 MG TOTAL) BY MOUTH EVERY 7 (SEVEN) DAYS. TAKE WITH A FULL GLASS OF WATER ON AN EMPTY STOMACH.   aspirin 81 MG EC tablet Take 81 mg by mouth daily. Swallow whole.   buPROPion [facility]  SR) 150 MG 12 hr tablet Take 1 tablet (150 mg total) by mouth in the morning.   calcium carbonate (OS-CAL) 600 MG TABS tablet Take 1 tablet (600 mg total) by mouth 2 (two) times daily with a meal.   chlorthalidone (HYGROTON) 25 MG tablet Take 1 tablet (25 mg total) by mouth daily.   diclofenac Sodium (VOLTAREN) 1 % GEL Apply 2 g topically daily as needed (Osteoarthritis).   GEMTESA 75 MG TABS Take 1 tablet by mouth daily.   ketoconazole (NIZORAL) 2 % cream APPLY 1 APPLICATION TOPICALLY DAILY   Lactobacillus (PROBIOTIC ACIDOPHILUS PO) Take 1 capsule by mouth daily.   Melatonin 10 MG TABS Take 10 mg by mouth at bedtime as needed (sleep).   metFORMIN (GLUCOPHAGE) 500 MG tablet Take 2 tablets (1,000 mg total) by mouth daily  with breakfast.   metroNIDAZOLE (METROGEL) 1 % gel Apply topically daily. (Patient taking differently: Apply 1 application  topically daily as needed (rosacea).)   Multiple Vitamin (MULTIVITAMIN) tablet Take 1 tablet by mouth daily.   naproxen sodium (ALEVE) 220 MG tablet Take 220 mg by mouth daily as needed.   omega-3 fish oil (MAXEPA) 1000 MG CAPS capsule Take 1 capsule by mouth daily.    tamoxifen (NOLVADEX) 20 MG tablet Take 1 tablet (20 mg total) by mouth daily.   Turmeric 500 MG CAPS Take 500 mg by mouth daily as needed (pain).   No facility-administered encounter medications on file as of 06/18/2022.     ONCOLOGIC FAMILY HISTORY:  Family History  Problem Relation Age of Onset   Heart attack Mother    Heart disease Mother    Hyperlipidemia Mother    Hypertension Mother    Stroke Mother    Miscarriages / [name] Mother    Diabetes Mother    Thyroid disease Mother    Cancer Mother    Obesity Mother    Melanoma Mother        dx after 72, sun-exposed areas   Cancer Father        skin   Alcohol abuse Father    Asthma Father    COPD Father    Heart attack Father    Heart disease Father    Obesity Father    Melanoma Father        dx after 59, back   Alcohol abuse Brother    Arthritis Brother    Drug abuse Brother    Alcohol abuse Brother    Cancer Brother        ? esophageal, d. 36   Breast cancer Paternal Aunt 39   Arthritis Maternal Grandmother    Diabetes Maternal Grandmother    Hyperlipidemia Maternal Grandmother    Colon cancer Maternal Grandfather        dx 41, dx 64   Alcohol abuse Paternal Grandfather    Arthritis Paternal Grandfather    Cirrhosis Paternal Grandfather    Depression Daughter    Hypertension Daughter    Mental illness Daughter    Colon polyps Daughter    Depression Son      GENETIC COUNSELING/TESTING: See above  SOCIAL HISTORY:  Social History   Socioeconomic History   Marital status: Widowed    Spouse name: Not on file    Number of children: 3   Years of education: Not on file   Highest education level: Not on file  Occupational History   Occupation: Reitired Engineer, maintenance (IT)    Comment: Retired Engineer, maintenance (IT)  Tobacco Use   Smoking  status: Former    Types: Cigarettes    Quit date: 08/12/1982    Years since quitting: 39.8    Passive exposure: Never   Smokeless tobacco: Never  Vaping Use   Vaping Use: Never used  Substance and Sexual Activity   Alcohol use: Yes    Alcohol/week: 1.0 standard drink of alcohol    Types: 1 Glasses of wine per week    Comment: every 1-2 wks.    Drug use: Never   Sexual activity: Not on file  Other Topics Concern   Not on file  Social History Narrative   Not on file   Social Determinants of Health   Financial Resource Strain: Low Risk  (10/04/2021)   Overall Financial Resource Strain (CARDIA)    Difficulty of Paying Living Expenses: Not hard at all  Food Insecurity: No Food Insecurity (08/17/2021)   Hunger Vital Sign    Worried About Running Out of Food in the Last Year: Never true    Ran Out of Food in the Last Year: Never true  Transportation Needs: No Transportation Needs (08/17/2021)   PRAPARE - Hydrologist (Medical): No    Lack of Transportation (Non-Medical): No  Physical Activity: Inactive (08/17/2021)   Exercise Vital Sign    Days of Exercise per Week: 0 days    Minutes of Exercise per Session: 0 min  Stress: No Stress Concern Present (08/17/2021)   [place]    Feeling of Stress : Not at all  Social Connections: Moderately Integrated (08/17/2021)   Social Connection and Isolation Panel [NHANES]    Frequency of Communication with Friends and Family: Twice a week    Frequency of Social Gatherings with Friends and Family: Twice a week    Attends Religious Services: More than 4 times per year    Active Member of [employer] or Organizations: Yes    Attends Archivist Meetings: More than 4  times per year    Marital Status: Widowed  Intimate Partner Violence: Not At Risk (08/17/2021)   Humiliation, Afraid, Rape, and Kick questionnaire    Fear of Current or Ex-Partner: No    Emotionally Abused: No    Physically Abused: No    Sexually Abused: No     OBSERVATIONS/OBJECTIVE:  There were no vitals taken for this visit.  Physical Exam Constitutional:      Appearance: Normal appearance.  Chest:     Comments: Large dense breasts bilaterally.  No palpable masses on exam today. No regional adenopathy Musculoskeletal:     Cervical back: Normal range of motion and neck supple. No rigidity.  Lymphadenopathy:     Cervical: No cervical adenopathy.  Neurological:     Mental Status: She is alert.     LABORATORY DATA:  None for this visit.  DIAGNOSTIC IMAGING:  None for this visit.     ASSESSMENT AND PLAN:  Ms.. Fowle is a pleasant 80 y.o. female with Stage IA right breast invasive ductal carcinoma, ER+/PR+/HER2-, diagnosed in 01/2021, treated with lumpectomy, and anti-estrogen therapy with Tamoxifen beginning in 04/2021.  [facility] for our initial meeting and routine follow-up post-completion of treatment for breast cancer.   1. Stage IA right breast cancer:  Ms. Oats is continuing to recover from definitive treatment for breast cancer.  She has been tolerating tamoxifen very well.  She could not move forward with radiation given severe claustrophobia.  Most recent imaging  from [place] showed possibly benign calcifications, repeat diagnostic mammogram recommended in 6 months.  Patient was strongly encouraged to schedule this.  She will have to call us back if she needs a new order for this.  2. Bone health: Kathy's most recent bone density test was completed on January 22, 2019.  This test showed a T score of -2.8 in the left femoral neck consistent with osteoporosis.   She is taking Fosamax weekly.  Most recent bone density in June 2023  consistent with osteoporosis, mild improvement.    She will return to clinic in 6 months or sooner as needed.  Tamoxifen prescription recently refilled. No new dental concerns.  Total time spent: 30 minutes   -*Total Encounter Time as defined by the Centers for Medicare and Medicaid Services includes, in addition to the face-to-face time of a patient visit (documented in the note above) non-face-to-face time: obtaining and reviewing outside history, ordering and reviewing medications, tests or procedures, care coordination (communications with other health care professionals or caregivers) and documentation in the medical record.

## 2022-06-19 ENCOUNTER — Other Ambulatory Visit (INDEPENDENT_AMBULATORY_CARE_PROVIDER_SITE_OTHER): Payer: Self-pay | Admitting: Family Medicine

## 2022-06-19 DIAGNOSIS — F3289 Other specified depressive episodes: Secondary | ICD-10-CM

## 2022-06-25 ENCOUNTER — Encounter (INDEPENDENT_AMBULATORY_CARE_PROVIDER_SITE_OTHER): Payer: Self-pay | Admitting: Family Medicine

## 2022-06-25 ENCOUNTER — Ambulatory Visit (INDEPENDENT_AMBULATORY_CARE_PROVIDER_SITE_OTHER): Payer: Medicare HMO | Admitting: Family Medicine

## 2022-06-25 VITALS — BP 150/83 | HR 86 | Temp 97.3°F | Ht 63.0 in | Wt 248.0 lb

## 2022-06-25 DIAGNOSIS — I10 Essential (primary) hypertension: Secondary | ICD-10-CM

## 2022-06-25 DIAGNOSIS — Z6841 Body Mass Index (BMI) 40.0 and over, adult: Secondary | ICD-10-CM | POA: Diagnosis not present

## 2022-06-25 DIAGNOSIS — E669 Obesity, unspecified: Secondary | ICD-10-CM | POA: Diagnosis not present

## 2022-06-25 DIAGNOSIS — R69 Illness, unspecified: Secondary | ICD-10-CM | POA: Diagnosis not present

## 2022-06-25 DIAGNOSIS — R7303 Prediabetes: Secondary | ICD-10-CM

## 2022-06-25 DIAGNOSIS — F3289 Other specified depressive episodes: Secondary | ICD-10-CM | POA: Diagnosis not present

## 2022-06-25 MED ORDER — BUPROPION HCL ER (SR) 150 MG PO TB12: 150.0000 mg | ORAL_TABLET | Freq: Every morning | ORAL | 0 refills | Status: DC

## 2022-06-25 MED ORDER — METFORMIN HCL 500 MG PO TABS: 1000.0000 mg | ORAL_TABLET | Freq: Every day | ORAL | 0 refills | Status: DC

## 2022-06-27 ENCOUNTER — Ambulatory Visit (INDEPENDENT_AMBULATORY_CARE_PROVIDER_SITE_OTHER): Payer: Medicare HMO | Admitting: Family Medicine

## 2022-07-02 DIAGNOSIS — F4389 Other reactions to severe stress: Secondary | ICD-10-CM | POA: Diagnosis not present

## 2022-07-02 DIAGNOSIS — F321 Major depressive disorder, single episode, moderate: Secondary | ICD-10-CM | POA: Diagnosis not present

## 2022-07-02 DIAGNOSIS — R69 Illness, unspecified: Secondary | ICD-10-CM | POA: Diagnosis not present

## 2022-07-09 NOTE — Progress Notes (Signed)
Chief Complaint:   OBESITY Kristi Hunter is here to discuss her progress with her obesity treatment plan along with follow-up of her obesity related diagnoses. Kristi Hunter is on practicing portion control and making smarter food choices, such as increasing vegetables and decreasing simple carbohydrates and states she is following her eating plan approximately 50% of the time. Kristi Hunter states she is doing pool exercise with a trainer for 45 minutes 3 times per week.  Today's visit was #: 49 Starting weight: 267 lbs Starting date: 02/22/2020 Today's weight: 248 lbs Today's date: 06/25/2022 Total lbs lost to date: 19 Total lbs lost since last in-office visit: 0  Interim History: Kristi Hunter is retaining fluid today.  She is following a portion control plan and she feels she is doing well with decreasing her portions.  She had a high sodium lunch, which may account for her weight gain.  Subjective:   1. Essential hypertension Kristi Hunter's blood pressure is elevated today, but normally well controlled.  She had a sodium rich lunch today.  2. Pre-diabetes Kristi Hunter is working on her diet and weight loss.  3. Other depression with emotional eating Kristi Hunter is struggling to take her second pill of Wellbutrin.  She is working on making strategies to help her remember.  Assessment/Plan:   1. Essential hypertension Kristi Hunter will continue with her diet and medications, and we will recheck her blood pressure in 1 month.  2. Pre-diabetes Kristi Hunter will continue metformin, and we will refill for 1 month. She will continue to work on weight loss, exercise, and decreasing simple carbohydrates to help decrease the risk of diabetes.   - metFORMIN (GLUCOPHAGE) 500 MG tablet; Take 2 tablets (1,000 mg total) by mouth daily with breakfast.  Dispense: 60 tablet; Refill: 0  3. Other depression with emotional eating Kristi Hunter will continue Wellbutrin SR 150 mg daily, and we will refill for 1 month.  -  buPROPion (WELLBUTRIN SR) 150 MG 12 hr tablet; Take 1 tablet (150 mg total) by mouth in the morning.  Dispense: 30 tablet; Refill: 0  4. Obesity, Current BMI 44.0 Kristi Hunter is currently in the action stage of change. As such, her goal is to continue with weight loss efforts. She has agreed to practicing portion control and making smarter food choices, such as increasing vegetables and decreasing simple carbohydrates.   Exercise goals: As is.   Behavioral modification strategies: increasing lean protein intake.  Kristi Hunter has agreed to follow-up with our clinic in 4 weeks. She was informed of the importance of frequent follow-up visits to maximize her success with intensive lifestyle modifications for her multiple health conditions.   Objective:   Blood pressure (!) 150/83, pulse 86, temperature (!) 97.3 F (36.3 C), height '5\' 3"'$  (1.6 m), weight 248 lb (112.5 kg), SpO2 94 %. Body mass index is 43.93 kg/m.  General: Cooperative, alert, well developed, in no acute distress. HEENT: Conjunctivae and lids unremarkable. Cardiovascular: Regular rhythm.  Lungs: Normal work of breathing. Neurologic: No focal deficits.   Lab Results  Component Value Date   CREATININE 0.71 03/15/2022   BUN 18 03/15/2022   NA 139 03/15/2022   K 3.5 03/15/2022   CL 100 03/15/2022   CO2 33 (H) 03/15/2022   Lab Results  Component Value Date   ALT 17 03/15/2022   AST 19 03/15/2022   ALKPHOS 43 03/15/2022   BILITOT 0.3 03/15/2022   Lab Results  Component Value Date   HGBA1C 5.9 (H) 02/20/2022   HGBA1C 5.8 (H) 12/25/2021  HGBA1C 5.7 (H) 04/12/2021   HGBA1C 5.8 (H) 02/19/2021   HGBA1C 5.9 (H) 02/22/2020   Lab Results  Component Value Date   INSULIN 22.5 02/20/2022   INSULIN 37.9 (H) 04/12/2021   INSULIN 21.5 02/22/2020   Lab Results  Component Value Date   TSH 1.470 02/22/2020   Lab Results  Component Value Date   CHOL 159 02/20/2022   HDL 49 02/20/2022   LDLCALC 98 02/20/2022   TRIG 58  02/20/2022   CHOLHDL 3 01/13/2019   Lab Results  Component Value Date   VD25OH 32.7 02/20/2022   VD25OH 35.1 12/25/2021   VD25OH 46.8 04/12/2021   Lab Results  Component Value Date   WBC 7.6 02/20/2022   HGB 12.6 02/20/2022   HCT 39.6 02/20/2022   MCV 87 02/20/2022   PLT 318 02/20/2022   No results found for: "IRON", "TIBC", "FERRITIN"  Attestation Statements:   Reviewed by clinician on day of visit: allergies, medications, problem list, medical history, surgical history, family history, social history, and previous encounter notes.   I, Trixie Dredge, am acting as transcriptionist for [name], MD.  I have reviewed the above documentation for accuracy and completeness, and I agree with the above. -  [name], MD

## 2022-07-11 DIAGNOSIS — R3915 Urgency of urination: Secondary | ICD-10-CM | POA: Diagnosis not present

## 2022-07-11 DIAGNOSIS — R350 Frequency of micturition: Secondary | ICD-10-CM | POA: Diagnosis not present

## 2022-07-16 DIAGNOSIS — F321 Major depressive disorder, single episode, moderate: Secondary | ICD-10-CM | POA: Diagnosis not present

## 2022-07-16 DIAGNOSIS — R69 Illness, unspecified: Secondary | ICD-10-CM | POA: Diagnosis not present

## 2022-07-16 DIAGNOSIS — F4389 Other reactions to severe stress: Secondary | ICD-10-CM | POA: Diagnosis not present

## 2022-07-16 DIAGNOSIS — B338 Other specified viral diseases: Secondary | ICD-10-CM | POA: Diagnosis not present

## 2022-07-19 ENCOUNTER — Telehealth: Payer: Self-pay | Admitting: *Deleted

## 2022-07-19 NOTE — Telephone Encounter (Signed)
L knee Orthovisc initiated through portal.

## 2022-07-19 NOTE — Telephone Encounter (Signed)
-----   Message from Mare Ferrari sent at 07/19/2022 10:33 AM EST ----- Regarding: gel auth Please run Kristi Hunter for gel shots for her L knee.   Lonn Georgia

## 2022-07-22 NOTE — Telephone Encounter (Signed)
Prior auth submitted to [employer].

## 2022-07-23 NOTE — Telephone Encounter (Signed)
L knee Orthoivisc approved. 07/22/22 - 10/21/2022.

## 2022-07-26 ENCOUNTER — Ambulatory Visit: Payer: Medicare HMO | Admitting: Family Medicine

## 2022-07-26 ENCOUNTER — Ambulatory Visit: Payer: Self-pay

## 2022-07-26 ENCOUNTER — Ambulatory Visit: Payer: Medicare HMO

## 2022-07-26 ENCOUNTER — Ambulatory Visit (INDEPENDENT_AMBULATORY_CARE_PROVIDER_SITE_OTHER): Payer: Medicare HMO

## 2022-07-26 VITALS — BP 142/82 | HR 91 | Ht 63.0 in | Wt 263.0 lb

## 2022-07-26 DIAGNOSIS — M25562 Pain in left knee: Secondary | ICD-10-CM

## 2022-07-26 DIAGNOSIS — G8929 Other chronic pain: Secondary | ICD-10-CM | POA: Diagnosis not present

## 2022-07-26 NOTE — Patient Instructions (Addendum)
Thank you for coming in today.   Please get an Xray today before you leave   You received an injection today. Seek immediate medical attention if the joint becomes red, extremely painful, or is oozing fluid.   If not better and the xray only shows mild arthritis we can move to an MRI with anxiety medicine either before or after your trip.

## 2022-07-26 NOTE — Progress Notes (Signed)
I, Kristi Hunter, LAT, ATC acting as a scribe for Kristi Leader, MD.  Kristi Hunter is a 81 y.o. female who presents to [place] at [facility] today for cont'd bilat knee pain. She lives at [place]. Pt completed the Orthovisc series, 3/3, in her L knee on 09/27/21. She had a R knee TKR previously.  Pt was last seen by Dr. Georgina Snell on 04/01/22 and was given a L knee steroid injection. Today, pt reports she doesn't have much pain in her L knee, like it was. Pt has new pain in the posterior aspect of her L knee. Pt is having difficulty transitioning to stand. Pt is wanting a repeat steroid inject as she feels like it provided more relief. Pt would like another referral to PT. Pt is leaving for [place] to stay for Jan and Feb.   Pain is predominantly located at the posterior lateral knee.  She feels swelling back there.  Dx testing: 04/01/22 R LE vasc reflux [name] 01/25/21 L knee XR             06/07/19 L knee XR  Pertinent review of systems: No fevers or chills  Relevant historical information: History of a right knee replacement.  Significant MRI anxiety and claustrophobia.  She barely got through a right knee MRI with oral anxiety medication with her daughter in the room previously.   Exam:  BP (!) 142/82   Pulse 91   Ht '5\' 3"'$  (1.6 m)   Wt 263 lb (119.3 kg)   SpO2 94%   BMI 46.59 kg/m  General: Obese, well nourished, and in no acute distress.   MSK: Left knee mild effusion otherwise normal-appearing Decreased range of motion limited flexion.  Tender palpation posterior lateral knee.  Intact strength to extension some pain with resisted knee flexion.   Lab and Radiology Results  Procedure: Real-time Ultrasound Guided Injection of left knee superior lateral patellar space Device: Philips Affiniti 50G Images permanently stored and available for review in PACS Ultrasound evaluation prior to injection reveals mild joint effusion.  At the area of pain at the posterior  lateral knee there is no clear or obvious abnormality.  No Baker's cyst visualized posterior medial knee. Verbal informed consent obtained.  Discussed risks and benefits of procedure. Warned about infection, bleeding, hyperglycemia damage to structures among others. Patient expresses understanding and agreement Time-out conducted.   Noted no overlying erythema, induration, or other signs of local infection.   Skin prepped in a sterile fashion.   Local anesthesia: Topical Ethyl chloride.   With sterile technique and under real time ultrasound guidance: 40 mg of Kenalog and 2 mL of Marcaine injected into knee joint. Fluid seen entering the joint capsule.   Completed without difficulty   Pain immediately resolved suggesting accurate placement of the medication.   Advised to call if fevers/chills, erythema, induration, drainage, or persistent bleeding.   Images permanently stored and available for review in the ultrasound unit.  Impression: Technically successful ultrasound guided injection.    X-ray images left knee obtained today personally and independently interpreted Mild to moderate tricompartmental DJD.  The patellofemoral joint is more severe than the medial lateral joint.  No acute fractures are visible.  Enthesiopathy changes are present at the fibular head which could represent insertional tendinopathy Await formal radiology review    Assessment and Plan: 81 y.o. female with left knee pain thought to be due to exacerbation of DJD.  Additionally she has a posterior lateral knee pain which  is not as well understood.  This may be simple DJD related or tendinopathy in this region or could be lumbar radiculopathy.  She has failed to improve with typical conservative management strategies including steroid injections and physical therapy especially for the posterior lateral pain.  Plan for repeat steroid injection and repeat physical therapy trial.  If still not better would consider MRI.   She has a long history of having difficulty tolerating MRIs.  After some negotiation we think we should be able to do an MRI with her knee in the MRI scanner overhead outside the MRI scanner with oral antianxiety medication with her daughter in the room.  This is not sufficient she may need moderate sedation.   PDMP not reviewed this encounter. Orders Placed This Encounter  Procedures   [name] LIMITED JOINT SPACE STRUCTURES LOW LEFT(NO LINKED CHARGES)    Order Specific Question:   Reason for Exam (SYMPTOM  OR DIAGNOSIS REQUIRED)    Answer:   left knee pain    Order Specific Question:   Preferred imaging location?    Answer:   [facility]   DG Knee AP/LAT W/[place] Left    Standing Status:   Future    Number of Occurrences:   1    Standing Expiration Date:   08/26/2022    Order Specific Question:   Reason for Exam (SYMPTOM  OR DIAGNOSIS REQUIRED)    Answer:   left knee pain    Order Specific Question:   Preferred imaging location?    Answer:   [facility]   Ambulatory referral to Physical Therapy    Referral Priority:   Routine    Referral Type:   Physical Medicine    Referral Reason:   Specialty Services Required    Requested Specialty:   Physical Therapy    Number of Visits Requested:   1   No orders of the defined types were placed in this encounter.    Discussed warning signs or symptoms. Please see discharge instructions. Patient expresses understanding.   The above documentation has been reviewed and is accurate and complete Kristi Hunter, M.D.

## 2022-07-30 DIAGNOSIS — R69 Illness, unspecified: Secondary | ICD-10-CM | POA: Diagnosis not present

## 2022-07-30 DIAGNOSIS — F4389 Other reactions to severe stress: Secondary | ICD-10-CM | POA: Diagnosis not present

## 2022-07-30 DIAGNOSIS — F321 Major depressive disorder, single episode, moderate: Secondary | ICD-10-CM | POA: Diagnosis not present

## 2022-07-31 ENCOUNTER — Encounter (INDEPENDENT_AMBULATORY_CARE_PROVIDER_SITE_OTHER): Payer: Self-pay | Admitting: Family Medicine

## 2022-07-31 ENCOUNTER — Ambulatory Visit (INDEPENDENT_AMBULATORY_CARE_PROVIDER_SITE_OTHER): Payer: Medicare HMO | Admitting: Family Medicine

## 2022-07-31 VITALS — BP 151/81 | HR 83 | Temp 97.5°F | Ht 63.0 in | Wt 244.2 lb

## 2022-07-31 DIAGNOSIS — E559 Vitamin D deficiency, unspecified: Secondary | ICD-10-CM | POA: Diagnosis not present

## 2022-07-31 DIAGNOSIS — E669 Obesity, unspecified: Secondary | ICD-10-CM

## 2022-07-31 DIAGNOSIS — Z6841 Body Mass Index (BMI) 40.0 and over, adult: Secondary | ICD-10-CM | POA: Diagnosis not present

## 2022-07-31 DIAGNOSIS — I10 Essential (primary) hypertension: Secondary | ICD-10-CM

## 2022-07-31 DIAGNOSIS — R7303 Prediabetes: Secondary | ICD-10-CM | POA: Diagnosis not present

## 2022-07-31 DIAGNOSIS — E538 Deficiency of other specified B group vitamins: Secondary | ICD-10-CM | POA: Diagnosis not present

## 2022-07-31 DIAGNOSIS — F3289 Other specified depressive episodes: Secondary | ICD-10-CM

## 2022-07-31 DIAGNOSIS — R69 Illness, unspecified: Secondary | ICD-10-CM | POA: Diagnosis not present

## 2022-07-31 MED ORDER — BUPROPION HCL ER (SR) 150 MG PO TB12: 150.0000 mg | ORAL_TABLET | Freq: Every morning | ORAL | 0 refills | Status: DC

## 2022-08-02 NOTE — Progress Notes (Signed)
Left knee x-ray shows severe arthritis changes.

## 2022-08-13 DIAGNOSIS — R69 Illness, unspecified: Secondary | ICD-10-CM | POA: Diagnosis not present

## 2022-08-13 DIAGNOSIS — F4389 Other reactions to severe stress: Secondary | ICD-10-CM | POA: Diagnosis not present

## 2022-08-13 DIAGNOSIS — F321 Major depressive disorder, single episode, moderate: Secondary | ICD-10-CM | POA: Diagnosis not present

## 2022-08-15 DIAGNOSIS — N3946 Mixed incontinence: Secondary | ICD-10-CM | POA: Diagnosis not present

## 2022-08-20 NOTE — Progress Notes (Unsigned)
Chief Complaint:   OBESITY Kristi Hunter is here to discuss her progress with her obesity treatment plan along with follow-up of her obesity related diagnoses. Pierrette is on practicing portion control and making smarter food choices, such as increasing vegetables and decreasing simple carbohydrates and states she is following her eating plan approximately 75% of the time. Kristi Hunter states she is doing pool exercise and aerobics for 30-45 minutes 1-2 times per week.  Today's visit was #: 30 Starting weight: 267 lbs Starting date: 02/22/2020 Today's weight: 244 lbs Today's date: 07/31/2022 Total lbs lost to date: 23 Total lbs lost since last in-office visit: 4  Interim History: Angellina has done well with weight loss in the last month.  She is trying to exercise, but her left knee has been in pain.  This has started to improve, but behind her knee is still painful.  Subjective:   1. Essential hypertension Kristi Hunter's blood pressure is elevated today. She feels this related to the automatic blood pressure cuff we use.   2. Vitamin D deficiency Kristi Hunter is on Vitamin D, and she is due to have her labs rechecked. She denies nausea, vomiting, or muscle weakness.   3. B12 deficiency Kristi Hunter is on B12 OTC, and she is due for labs.   4. Prediabetes Kristi Hunter is on metformin, and she is due to have labs done.   5. Emotinal Eating Behavior Kristi Hunter continues to work on [employer] behaviors.   Assessment/Plan:   1. Essential hypertension Kristi Hunter will continue with her diet and weight loss, and we will recheck her blood pressure manually next week.   2. Vitamin D deficiency We will check labs today. Ketra will follow-up for routine testing of Vitamin D, at least 2-3 times per year to avoid over-replacement.  - VITAMIN D 25 Hydroxy (Vit-D Deficiency, Fractures)  3. B12 deficiency We will check labs today, and we will follow- up at Ebonye's next visit.    - Vitamin B12  4. Prediabetes We will check labs today. Kristi Hunter will continue to work on weight loss, exercise, and decreasing simple carbohydrates to help decrease the risk of diabetes.   - CMP14+EGFR - Lipid Panel With LDL/HDL Ratio - Insulin, random - Hemoglobin A1c - TSH  5. Emotinal Eating Behavior Kristi Hunter will continue Wellbutrin SR 150 mg once daily, and we will refill for 1 month.   - buPROPion (WELLBUTRIN SR) 150 MG 12 hr tablet; Take 1 tablet (150 mg total) by mouth in the morning.  Dispense: 30 tablet; Refill: 0  6. Obesity, Current BMI 43.3 Clydean is currently in the action stage of change. As such, her goal is to continue with weight loss efforts. She has agreed to practicing portion control and making smarter food choices, such as increasing vegetables and decreasing simple carbohydrates.   Exercise goals: Continue physical therapy PS exercise and we will reevaluate after her knee MRI is done.  Behavioral modification strategies: holiday eating strategies  and celebration eating strategies.  Kristi Hunter has agreed to follow-up with our clinic in 3 to 4 weeks. She was informed of the importance of frequent follow-up visits to maximize her success with intensive lifestyle modifications for her multiple health conditions.   Ruble was informed we would discuss her lab results at her next visit unless there is a critical issue that needs to be addressed sooner. Dannette agreed to keep her next visit at the agreed upon time to discuss these results.  Objective:   Blood pressure (!) 151/81, pulse  83, temperature (!) 97.5 F (36.4 C), height '5\' 3"'$  (1.6 m), weight 244 lb 3.2 oz (110.8 kg), SpO2 97 %. Body mass index is 43.26 kg/m.  General: Cooperative, alert, well developed, in no acute distress. HEENT: Conjunctivae and lids unremarkable. Cardiovascular: Regular rhythm.  Lungs: Normal work of breathing. Neurologic: No focal deficits.   Lab Results   Component Value Date   CREATININE 0.71 03/15/2022   BUN 18 03/15/2022   NA 139 03/15/2022   K 3.5 03/15/2022   CL 100 03/15/2022   CO2 33 (H) 03/15/2022   Lab Results  Component Value Date   ALT 17 03/15/2022   AST 19 03/15/2022   ALKPHOS 43 03/15/2022   BILITOT 0.3 03/15/2022   Lab Results  Component Value Date   HGBA1C 5.9 (H) 02/20/2022   HGBA1C 5.8 (H) 12/25/2021   HGBA1C 5.7 (H) 04/12/2021   HGBA1C 5.8 (H) 02/19/2021   HGBA1C 5.9 (H) 02/22/2020   Lab Results  Component Value Date   INSULIN 22.5 02/20/2022   INSULIN 37.9 (H) 04/12/2021   INSULIN 21.5 02/22/2020   Lab Results  Component Value Date   TSH 1.470 02/22/2020   Lab Results  Component Value Date   CHOL 159 02/20/2022   HDL 49 02/20/2022   LDLCALC 98 02/20/2022   TRIG 58 02/20/2022   CHOLHDL 3 01/13/2019   Lab Results  Component Value Date   VD25OH 32.7 02/20/2022   VD25OH 35.1 12/25/2021   VD25OH 46.8 04/12/2021   Lab Results  Component Value Date   WBC 7.6 02/20/2022   HGB 12.6 02/20/2022   HCT 39.6 02/20/2022   MCV 87 02/20/2022   PLT 318 02/20/2022   No results found for: "IRON", "TIBC", "FERRITIN"  Attestation Statements:   Reviewed by clinician on day of visit: allergies, medications, problem list, medical history, surgical history, family history, social history, and previous encounter notes.  I have personally spent 44 minutes total time today in preparation, patient care, and documentation for this visit, including the following: review of clinical lab tests; review of medical tests/procedures/services.   I, Trixie Dredge, am acting as transcriptionist for [name], MD.  I have reviewed the above documentation for accuracy and completeness, and I agree with the above. -  [name], MD

## 2022-08-21 DIAGNOSIS — Z0000 Encounter for general adult medical examination without abnormal findings: Secondary | ICD-10-CM | POA: Diagnosis not present

## 2022-08-21 DIAGNOSIS — R69 Illness, unspecified: Secondary | ICD-10-CM | POA: Diagnosis not present

## 2022-08-21 DIAGNOSIS — M159 Polyosteoarthritis, unspecified: Secondary | ICD-10-CM | POA: Diagnosis not present

## 2022-08-21 DIAGNOSIS — M810 Age-related osteoporosis without current pathological fracture: Secondary | ICD-10-CM | POA: Diagnosis not present

## 2022-08-21 DIAGNOSIS — G4709 Other insomnia: Secondary | ICD-10-CM | POA: Diagnosis not present

## 2022-08-21 DIAGNOSIS — R739 Hyperglycemia, unspecified: Secondary | ICD-10-CM | POA: Diagnosis not present

## 2022-08-21 DIAGNOSIS — E782 Mixed hyperlipidemia: Secondary | ICD-10-CM | POA: Diagnosis not present

## 2022-08-21 DIAGNOSIS — I10 Essential (primary) hypertension: Secondary | ICD-10-CM | POA: Diagnosis not present

## 2022-08-22 DIAGNOSIS — R7301 Impaired fasting glucose: Secondary | ICD-10-CM | POA: Diagnosis not present

## 2022-08-22 DIAGNOSIS — E039 Hypothyroidism, unspecified: Secondary | ICD-10-CM | POA: Diagnosis not present

## 2022-08-22 DIAGNOSIS — E785 Hyperlipidemia, unspecified: Secondary | ICD-10-CM | POA: Diagnosis not present

## 2022-08-22 DIAGNOSIS — I10 Essential (primary) hypertension: Secondary | ICD-10-CM | POA: Diagnosis not present

## 2022-08-22 DIAGNOSIS — R531 Weakness: Secondary | ICD-10-CM | POA: Diagnosis not present

## 2022-08-23 LAB — LIPID PANEL
Cholesterol: 161 (ref 0–200)
HDL: 64 (ref 35–70)
LDL Cholesterol: 84
Triglycerides: 58 (ref 40–160)

## 2022-08-23 LAB — BASIC METABOLIC PANEL
BUN: 19 (ref 4–21)
CO2: 31 — AB (ref 13–22)
Chloride: 101 (ref 99–108)
Creatinine: 0.6 (ref 0.5–1.1)
Glucose: 95
Potassium: 4.3 mEq/L (ref 3.5–5.1)
Sodium: 140 (ref 137–147)

## 2022-08-23 LAB — CBC AND DIFFERENTIAL
HCT: 40 (ref 36–46)
Hemoglobin: 13 (ref 12.0–16.0)
Neutrophils Absolute: 5603
Platelets: 350 10*3/uL (ref 150–400)
WBC: 9.2

## 2022-08-23 LAB — HEMOGLOBIN A1C: Hemoglobin A1C: 5.8

## 2022-08-23 LAB — HEPATIC FUNCTION PANEL
ALT: 15 U/L (ref 7–35)
AST: 15 (ref 13–35)
Bilirubin, Total: 0.3

## 2022-08-23 LAB — COMPREHENSIVE METABOLIC PANEL
Albumin: 3.5 (ref 3.5–5.0)
Calcium: 8.9 (ref 8.7–10.7)
Globulin: 3.1
eGFR: 90

## 2022-08-23 LAB — CBC: RBC: 4.6 (ref 3.87–5.11)

## 2022-08-23 LAB — TSH: TSH: 3.1 (ref 0.41–5.90)

## 2022-08-26 DIAGNOSIS — M25562 Pain in left knee: Secondary | ICD-10-CM | POA: Diagnosis not present

## 2022-08-26 DIAGNOSIS — M1712 Unilateral primary osteoarthritis, left knee: Secondary | ICD-10-CM | POA: Diagnosis not present

## 2022-08-27 DIAGNOSIS — F321 Major depressive disorder, single episode, moderate: Secondary | ICD-10-CM | POA: Diagnosis not present

## 2022-08-27 DIAGNOSIS — F4389 Other reactions to severe stress: Secondary | ICD-10-CM | POA: Diagnosis not present

## 2022-08-27 DIAGNOSIS — R69 Illness, unspecified: Secondary | ICD-10-CM | POA: Diagnosis not present

## 2022-08-28 ENCOUNTER — Telehealth (INDEPENDENT_AMBULATORY_CARE_PROVIDER_SITE_OTHER): Payer: Medicare HMO | Admitting: Family Medicine

## 2022-08-28 ENCOUNTER — Encounter (INDEPENDENT_AMBULATORY_CARE_PROVIDER_SITE_OTHER): Payer: Self-pay | Admitting: Family Medicine

## 2022-08-28 DIAGNOSIS — R42 Dizziness and giddiness: Secondary | ICD-10-CM

## 2022-08-28 DIAGNOSIS — E669 Obesity, unspecified: Secondary | ICD-10-CM

## 2022-08-28 DIAGNOSIS — I10 Essential (primary) hypertension: Secondary | ICD-10-CM

## 2022-08-28 DIAGNOSIS — Z6841 Body Mass Index (BMI) 40.0 and over, adult: Secondary | ICD-10-CM

## 2022-08-28 NOTE — Progress Notes (Signed)
TeleHealth Visit:  This visit was completed with telemedicine (audio/video) technology. [name] has verbally consented to this TeleHealth visit. The patient is located at home, the provider is located at home. The participants in this visit include the listed provider and patient. The visit was conducted today via MyChart video.  OBESITY Kristi Hunter is here to discuss her progress with her obesity treatment plan along with follow-up of her obesity related diagnoses.   Today's visit was # 31 Starting weight: 267 lbs Starting date: 02/22/2020 Weight at last in office visit: 244 lbs on 07/31/22 Total weight loss: 23 lbs at last in office visit on 07/31/22. Today's reported weight: 245.5 lbs   Nutrition Plan: practicing portion control and making smarter food choices, such as increasing vegetables and decreasing simple carbohydrates.   Current exercise:  pool exercise and aerobics for 30-45 minutes 2-3 times per week.  Interim History:  She is experiencing vertigo today and changed her visit to a virtual visit.   She is headed to [place] in 2 days for a 3 to 4-week visit.  She will be dining out while there but reports she avoids carbohydrates when dining out.  She feels she eats healthier on vacation and than she does when she is at home. She lives at [place] in an apartment. They prepare her dinner each day.  She generally has leftovers from dinner for lunch the next day.  Makes her own breakfast-2 eggs, toast, 1 strip of bacon.  She says she gets protein at 2 out of 3 meals.  Assessment/Plan:  1.  Vertigo She is experiencing sensation of head spinning and nausea.  Has lack of appetite but is trying to sip fluids.  Feeling unsteady on her feet.  She has a walker.  She reports she has had this several times and been hospitalized for it twice.  Plan: She plans on seeing the doctor at [facility] later today if she does not feel better.  She can have them send a wheelchair to get  her. Continue to push fluids.  2. Hypertension Hypertension poorly controlled.  Blood pressure has been elevated at last 2 visits to our office.  Recently at [facility] it was 121/79. Medication(s): Chlorthalidone 25 mg daily   BP Readings from Last 3 Encounters:  07/31/22 (!) 151/81  07/26/22 (!) 142/82  06/25/22 (!) 150/83   Lab Results  Component Value Date   CREATININE 0.71 03/15/2022   CREATININE 0.60 02/20/2022   CREATININE 0.76 12/11/2021    Plan: Continue chlorthalidone 25 mg daily. We will continue to monitor at future in office visits.  3. Obesity: Current BMI 43.3 [name] is currently in the action stage of change. As such, her goal is to continue with weight loss efforts.  She has agreed to practicing portion control and making smarter food choices, such as increasing vegetables and decreasing simple carbohydrates.   Exercise goals:  as is  Behavioral modification strategies: increasing lean protein intake, decreasing simple carbohydrates, increasing water intake, and travel eating strategies.  Ruhani has agreed to follow-up with our clinic in 6 weeks.   No orders of the defined types were placed in this encounter.   There are no discontinued medications.   No orders of the defined types were placed in this encounter.     Objective:   VITALS: Per patient if applicable, see vitals. GENERAL: Alert and in no acute distress. CARDIOPULMONARY: No increased WOB. Speaking in clear sentences.  PSYCH: Pleasant and cooperative. Speech normal rate and rhythm. Affect is  appropriate. Insight and judgement are appropriate. Attention is focused, linear, and appropriate.  NEURO: Oriented as arrived to appointment on time with no prompting.   Lab Results  Component Value Date   CREATININE 0.71 03/15/2022   BUN 18 03/15/2022   NA 139 03/15/2022   K 3.5 03/15/2022   CL 100 03/15/2022   CO2 33 (H) 03/15/2022   Lab Results  Component Value Date   ALT 17  03/15/2022   AST 19 03/15/2022   ALKPHOS 43 03/15/2022   BILITOT 0.3 03/15/2022   Lab Results  Component Value Date   HGBA1C 5.9 (H) 02/20/2022   HGBA1C 5.8 (H) 12/25/2021   HGBA1C 5.7 (H) 04/12/2021   HGBA1C 5.8 (H) 02/19/2021   HGBA1C 5.9 (H) 02/22/2020   Lab Results  Component Value Date   INSULIN 22.5 02/20/2022   INSULIN 37.9 (H) 04/12/2021   INSULIN 21.5 02/22/2020   Lab Results  Component Value Date   TSH 1.470 02/22/2020   Lab Results  Component Value Date   CHOL 159 02/20/2022   HDL 49 02/20/2022   LDLCALC 98 02/20/2022   TRIG 58 02/20/2022   CHOLHDL 3 01/13/2019   Lab Results  Component Value Date   WBC 7.6 02/20/2022   HGB 12.6 02/20/2022   HCT 39.6 02/20/2022   MCV 87 02/20/2022   PLT 318 02/20/2022   No results found for: "IRON", "TIBC", "FERRITIN" Lab Results  Component Value Date   VD25OH 32.7 02/20/2022   VD25OH 35.1 12/25/2021   VD25OH 46.8 04/12/2021    Attestation Statements:   Reviewed by clinician on day of visit: allergies, medications, problem list, medical history, surgical history, family history, social history, and previous encounter notes.  Time spent on visit including the items listed below was 30 minutes.  -preparing to see the patient (e.g., review of tests, history, previous notes) -obtaining and/or reviewing separately obtained history -counseling and educating the patient/family/caregiver -documenting clinical information in the electronic or other health record -ordering medications, tests, or procedures -independently interpreting results and communicating results to the patient/ family/caregiver -referring and communicating with other health care professionals  -care coordination

## 2022-08-29 DIAGNOSIS — M1712 Unilateral primary osteoarthritis, left knee: Secondary | ICD-10-CM | POA: Diagnosis not present

## 2022-08-29 DIAGNOSIS — M25562 Pain in left knee: Secondary | ICD-10-CM | POA: Diagnosis not present

## 2022-09-10 DIAGNOSIS — R69 Illness, unspecified: Secondary | ICD-10-CM | POA: Diagnosis not present

## 2022-09-10 DIAGNOSIS — F4389 Other reactions to severe stress: Secondary | ICD-10-CM | POA: Diagnosis not present

## 2022-09-10 DIAGNOSIS — F321 Major depressive disorder, single episode, moderate: Secondary | ICD-10-CM | POA: Diagnosis not present

## 2022-09-11 DIAGNOSIS — R0981 Nasal congestion: Secondary | ICD-10-CM | POA: Diagnosis not present

## 2022-09-11 DIAGNOSIS — J209 Acute bronchitis, unspecified: Secondary | ICD-10-CM | POA: Diagnosis not present

## 2022-09-12 DIAGNOSIS — J209 Acute bronchitis, unspecified: Secondary | ICD-10-CM | POA: Diagnosis not present

## 2022-09-12 DIAGNOSIS — C50919 Malignant neoplasm of unspecified site of unspecified female breast: Secondary | ICD-10-CM | POA: Diagnosis not present

## 2022-09-12 DIAGNOSIS — Z885 Allergy status to narcotic agent status: Secondary | ICD-10-CM | POA: Diagnosis not present

## 2022-09-12 DIAGNOSIS — Z7984 Long term (current) use of oral hypoglycemic drugs: Secondary | ICD-10-CM | POA: Diagnosis not present

## 2022-09-12 DIAGNOSIS — Z79899 Other long term (current) drug therapy: Secondary | ICD-10-CM | POA: Diagnosis not present

## 2022-09-12 DIAGNOSIS — Z7983 Long term (current) use of bisphosphonates: Secondary | ICD-10-CM | POA: Diagnosis not present

## 2022-09-12 DIAGNOSIS — Z880 Allergy status to penicillin: Secondary | ICD-10-CM | POA: Diagnosis not present

## 2022-09-12 DIAGNOSIS — R059 Cough, unspecified: Secondary | ICD-10-CM | POA: Diagnosis not present

## 2022-09-12 DIAGNOSIS — Z79810 Long term (current) use of selective estrogen receptor modulators (SERMs): Secondary | ICD-10-CM | POA: Diagnosis not present

## 2022-09-12 DIAGNOSIS — Z6841 Body Mass Index (BMI) 40.0 and over, adult: Secondary | ICD-10-CM | POA: Diagnosis not present

## 2022-09-24 DIAGNOSIS — R69 Illness, unspecified: Secondary | ICD-10-CM | POA: Diagnosis not present

## 2022-09-24 DIAGNOSIS — F321 Major depressive disorder, single episode, moderate: Secondary | ICD-10-CM | POA: Diagnosis not present

## 2022-09-24 DIAGNOSIS — F4389 Other reactions to severe stress: Secondary | ICD-10-CM | POA: Diagnosis not present

## 2022-09-26 DIAGNOSIS — L719 Rosacea, unspecified: Secondary | ICD-10-CM | POA: Diagnosis not present

## 2022-09-26 DIAGNOSIS — D1801 Hemangioma of skin and subcutaneous tissue: Secondary | ICD-10-CM | POA: Diagnosis not present

## 2022-09-26 DIAGNOSIS — D224 Melanocytic nevi of scalp and neck: Secondary | ICD-10-CM | POA: Diagnosis not present

## 2022-09-26 DIAGNOSIS — Z1283 Encounter for screening for malignant neoplasm of skin: Secondary | ICD-10-CM | POA: Diagnosis not present

## 2022-09-26 DIAGNOSIS — L821 Other seborrheic keratosis: Secondary | ICD-10-CM | POA: Diagnosis not present

## 2022-09-26 DIAGNOSIS — L814 Other melanin hyperpigmentation: Secondary | ICD-10-CM | POA: Diagnosis not present

## 2022-09-26 DIAGNOSIS — L578 Other skin changes due to chronic exposure to nonionizing radiation: Secondary | ICD-10-CM | POA: Diagnosis not present

## 2022-09-26 DIAGNOSIS — D2339 Other benign neoplasm of skin of other parts of face: Secondary | ICD-10-CM | POA: Diagnosis not present

## 2022-09-30 DIAGNOSIS — M1712 Unilateral primary osteoarthritis, left knee: Secondary | ICD-10-CM | POA: Diagnosis not present

## 2022-09-30 DIAGNOSIS — M25562 Pain in left knee: Secondary | ICD-10-CM | POA: Diagnosis not present

## 2022-10-03 ENCOUNTER — Telehealth: Payer: Medicare HMO

## 2022-10-03 DIAGNOSIS — M1712 Unilateral primary osteoarthritis, left knee: Secondary | ICD-10-CM | POA: Diagnosis not present

## 2022-10-03 DIAGNOSIS — M25562 Pain in left knee: Secondary | ICD-10-CM | POA: Diagnosis not present

## 2022-10-04 DIAGNOSIS — R351 Nocturia: Secondary | ICD-10-CM | POA: Diagnosis not present

## 2022-10-04 DIAGNOSIS — N3946 Mixed incontinence: Secondary | ICD-10-CM | POA: Diagnosis not present

## 2022-10-08 ENCOUNTER — Other Ambulatory Visit: Payer: Self-pay | Admitting: Urology

## 2022-10-08 DIAGNOSIS — M1712 Unilateral primary osteoarthritis, left knee: Secondary | ICD-10-CM | POA: Diagnosis not present

## 2022-10-08 DIAGNOSIS — F321 Major depressive disorder, single episode, moderate: Secondary | ICD-10-CM | POA: Diagnosis not present

## 2022-10-08 DIAGNOSIS — M25562 Pain in left knee: Secondary | ICD-10-CM | POA: Diagnosis not present

## 2022-10-08 DIAGNOSIS — R69 Illness, unspecified: Secondary | ICD-10-CM | POA: Diagnosis not present

## 2022-10-10 ENCOUNTER — Telehealth (INDEPENDENT_AMBULATORY_CARE_PROVIDER_SITE_OTHER): Payer: Medicare HMO | Admitting: Family Medicine

## 2022-10-10 ENCOUNTER — Encounter (INDEPENDENT_AMBULATORY_CARE_PROVIDER_SITE_OTHER): Payer: Self-pay | Admitting: Family Medicine

## 2022-10-10 DIAGNOSIS — R7303 Prediabetes: Secondary | ICD-10-CM

## 2022-10-10 DIAGNOSIS — M1712 Unilateral primary osteoarthritis, left knee: Secondary | ICD-10-CM | POA: Diagnosis not present

## 2022-10-10 DIAGNOSIS — Z6841 Body Mass Index (BMI) 40.0 and over, adult: Secondary | ICD-10-CM

## 2022-10-10 DIAGNOSIS — F3289 Other specified depressive episodes: Secondary | ICD-10-CM | POA: Diagnosis not present

## 2022-10-10 DIAGNOSIS — M25562 Pain in left knee: Secondary | ICD-10-CM | POA: Diagnosis not present

## 2022-10-10 DIAGNOSIS — R69 Illness, unspecified: Secondary | ICD-10-CM | POA: Diagnosis not present

## 2022-10-10 NOTE — Progress Notes (Signed)
TeleHealth Visit:  This visit was completed with telemedicine (audio/video) technology. Kristi Hunter has verbally consented to this TeleHealth visit. The patient is located at home, the provider is located at home. The participants in this visit include the listed provider and patient. The visit was conducted today via MyChart video.  Kristi Hunter is here to discuss her progress with her Kristi treatment plan along with follow-up of her Kristi related diagnoses.   Today's visit was # 32 Starting weight: 267 lbs Starting date: 02/22/2020 Weight at last in office visit: 244 lbs on 07/31/22 Total weight loss: 23 lbs at last in office visit on 07/31/22. Today's reported weight: 245-246 lbs   Nutrition Plan: practicing portion control and making smarter food choices, such as increasing vegetables and decreasing simple carbohydrates.    Current exercise:   PT twice weekly.   Interim History:  Kristi Hunter recently got back from a trip from [place].  She had bronchitis on the trip and was not able to exercise as she had planned. She brought 2 boxes of [employer] cookies and is just finishing up the second box.  She does not plan on buying more sweets after that. Today she had a bagel for breakfast but generally she has 2 eggs, 1 slice of toast and bacon.Marland Kitchen  She will be having shrimp, sweet potato, green beans and Greek salad for dinner.  Her dinners are cooked by [place] she lives.  She often eats leftovers for lunch.  She has 1-1/2 cups of coffee, 1 diet soda, tea and water daily.  Has 1 to 2 glasses of wine per week.  She has not resumed normal exercise because she has been sick and had calendar conflicts.  Assessment/Plan:  1. Emotional eating behavior Kristi Hunter has had issues with stress/emotional eating. Currently this is poorly controlled. Overall mood is stable. Medication(s): Prescribed Bupropion SR 150 mg daily but she is taking it every other day because she is  trying to wean off.  She does not feel it helps with cravings.  Plan: Continue taking bupropion 150 mg every other day and then may discontinue.  2. Prediabetes Last A1c was 5.8 on 08/23/2022. Medication(s): Prescribed metformin 1000 mg at breakfast.  She is taking 500 mg at breakfast and at dinner. Appetite well-controlled but has sweets cravings. Lab Results  Component Value Date   HGBA1C 5.8 08/23/2022   HGBA1C 5.9 (H) 02/20/2022   HGBA1C 5.8 (H) 12/25/2021   HGBA1C 5.7 (H) 04/12/2021   HGBA1C 5.8 (H) 02/19/2021   Lab Results  Component Value Date   INSULIN 22.5 02/20/2022   INSULIN 37.9 (H) 04/12/2021   INSULIN 21.5 02/22/2020    Plan: Continue metformin 500 mg at breakfast and dinner.   3. Morbid Kristi: Current BMI 43.2  Kristi Hunter is currently in the action stage of change. As such, her goal is to continue with weight loss efforts.  She has agreed to practicing portion control and making smarter food choices, such as increasing vegetables and decreasing simple carbohydrates.  1.  Keep sweets out of the home. 2.  Limit rice and potatoes to 1/2 cup.  Exercise goals: Resume normal exercise routine when she is able.  Behavioral modification strategies: decreasing simple carbohydrates , meal planning , and planning for success.  Kristi Hunter has agreed to follow-up with our clinic in 5 weeks.   No orders of the defined types were placed in this encounter.   There are no discontinued medications.   No orders of the defined types  were placed in this encounter.     Objective:   VITALS: Per patient if applicable, see vitals. GENERAL: Alert and in no acute distress. CARDIOPULMONARY: No increased WOB. Speaking in clear sentences.  PSYCH: Pleasant and cooperative. Speech normal rate and rhythm. Affect is appropriate. Insight and judgement are appropriate. Attention is focused, linear, and appropriate.  NEURO: Oriented as arrived to appointment on time with no prompting.    Attestation Statements:   Reviewed by clinician on day of visit: allergies, medications, problem list, medical history, surgical history, family history, social history, and previous encounter notes.  Time spent on visit including the items listed below was 30 minutes.  -preparing to see the patient (e.g., review of tests, history, previous notes) -obtaining and/or reviewing separately obtained history -counseling and educating the patient/family/caregiver -documenting clinical information in the electronic or other health record -ordering medications, tests, or procedures -independently interpreting results and communicating results to the patient/ family/caregiver -referring and communicating with other health care professionals  -care coordination   This was prepared with the assistance of Presenter, broadcasting.  Occasional wrong-word or sound-a-like substitutions may have occurred due to the inherent limitations of voice recognition software.

## 2022-10-14 DIAGNOSIS — M1712 Unilateral primary osteoarthritis, left knee: Secondary | ICD-10-CM | POA: Diagnosis not present

## 2022-10-14 DIAGNOSIS — M25562 Pain in left knee: Secondary | ICD-10-CM | POA: Diagnosis not present

## 2022-10-16 DIAGNOSIS — R2681 Unsteadiness on feet: Secondary | ICD-10-CM | POA: Diagnosis not present

## 2022-10-16 DIAGNOSIS — H409 Unspecified glaucoma: Secondary | ICD-10-CM | POA: Diagnosis not present

## 2022-10-16 DIAGNOSIS — J4489 Other specified chronic obstructive pulmonary disease: Secondary | ICD-10-CM | POA: Diagnosis not present

## 2022-10-16 DIAGNOSIS — Z7984 Long term (current) use of oral hypoglycemic drugs: Secondary | ICD-10-CM | POA: Diagnosis not present

## 2022-10-16 DIAGNOSIS — N3941 Urge incontinence: Secondary | ICD-10-CM | POA: Diagnosis not present

## 2022-10-16 DIAGNOSIS — Z7983 Long term (current) use of bisphosphonates: Secondary | ICD-10-CM | POA: Diagnosis not present

## 2022-10-16 DIAGNOSIS — I209 Angina pectoris, unspecified: Secondary | ICD-10-CM | POA: Diagnosis not present

## 2022-10-16 DIAGNOSIS — Z87891 Personal history of nicotine dependence: Secondary | ICD-10-CM | POA: Diagnosis not present

## 2022-10-16 DIAGNOSIS — R7303 Prediabetes: Secondary | ICD-10-CM | POA: Diagnosis not present

## 2022-10-16 DIAGNOSIS — R600 Localized edema: Secondary | ICD-10-CM | POA: Diagnosis not present

## 2022-10-16 DIAGNOSIS — M1990 Unspecified osteoarthritis, unspecified site: Secondary | ICD-10-CM | POA: Diagnosis not present

## 2022-10-16 DIAGNOSIS — Z008 Encounter for other general examination: Secondary | ICD-10-CM | POA: Diagnosis not present

## 2022-10-16 DIAGNOSIS — G4733 Obstructive sleep apnea (adult) (pediatric): Secondary | ICD-10-CM | POA: Diagnosis not present

## 2022-10-16 NOTE — Progress Notes (Signed)
I, Josepha Pigg, CMA acting as a scribe for [name], MD.  Kristi Hunter is a 82 y.o. female who presents to [place] at [facility] today for re-occurring left knee pain.  She lives at [place]. Pt completed the Orthovisc series, 3/3, in her L knee on 09/27/21. She's had a R knee TKR. Patient was last seen by Dr. Georgina Snell on 07/26/2022 and was given a L knee steroid injection and was re-referred to PT at [facility]. Today, pt reports limited flexion of left knee, otherwise the knee is doing OK. PT has been beneficial, compliant with HEP provided. Last knee inj was also beneficial for sx, other than behind the knee. Notes that PT advised it is structural and not a Baker's cyst previously.  Dx testing: 07/26/22 L knee XR 01/25/21 L knee XR             06/07/19 L knee XR  Pertinent review of systems: No fevers or chills  Relevant historical information: Hypertension MRI claustrophobia noted.  Exam:  BP 130/84   Pulse 78   Ht 5' 3.5" (1.613 m)   Wt 254 lb 6.4 oz (115.4 kg)   SpO2 98%   BMI 44.36 kg/m  General: Well Developed, well nourished, and in no acute distress.   MSK: Left knee: Mild effusion. Decreased range of motion lacks full flexion.  She is unable to flex the knee to 90 degrees.   Strength is intact. Palpable masses posterior knee.  Mildly tender palpation. Stable ligamentous exam.    Lab and Radiology Results  .Diagnostic Limited MSK Ultrasound of: Left knee Mild effusion present. Intact quad and patellar tendon. Posterior knee no Baker's cyst visible.  Subcutaneous tissue visible.  Masses appear consistent with surrounding subcutaneous tissue may be a lipoma. Impression: Possible lipoma postradiation.      Assessment and Plan: 82 y.o. female with left knee posterior knee pain and masses and lack of flexion range of motion.  This occurs in the setting of significant knee arthritis.  She attributes her lack of range of motion to the  mass at the posterior knee that may be the case however I think the likely underlying issue is knee arthritis.  Will proceed with MRI of the knee to evaluate the solid mass at the posterior knee.  This can be a noncontrast MRI.  She has MRI claustrophobia but I think should be a way to get through an MRI with oral sedation.  If not we will have to use moderate sedation.  Recheck following MRI.   PDMP not reviewed this encounter. Orders Placed This Encounter  Procedures   [name] LIMITED JOINT SPACE STRUCTURES LOW LEFT(NO LINKED CHARGES)    Order Specific Question:   Reason for Exam (SYMPTOM  OR DIAGNOSIS REQUIRED)    Answer:   Left knee pain    Order Specific Question:   Preferred imaging location?    Answer:   [facility]   MR Knee Left  Wo Contrast    Standing Status:   Future    Standing Expiration Date:   10/17/2023    Order Specific Question:   What is the patient's sedation requirement?    Answer:   No Sedation    Order Specific Question:   Does the patient have a pacemaker or implanted devices?    Answer:   No    Order Specific Question:   Preferred imaging location?    Answer:   [facility] (table limit-550lbs)  No orders of the defined types were placed in this encounter.    Discussed warning signs or symptoms. Please see discharge instructions. Patient expresses understanding.   The above documentation has been reviewed and is accurate and complete [name], M.D.

## 2022-10-17 ENCOUNTER — Other Ambulatory Visit: Payer: Self-pay

## 2022-10-17 ENCOUNTER — Encounter (HOSPITAL_BASED_OUTPATIENT_CLINIC_OR_DEPARTMENT_OTHER): Payer: Self-pay | Admitting: Urology

## 2022-10-17 ENCOUNTER — Ambulatory Visit: Payer: Medicare HMO | Admitting: Family Medicine

## 2022-10-17 ENCOUNTER — Ambulatory Visit: Payer: Self-pay

## 2022-10-17 VITALS — BP 130/84 | HR 78 | Ht 63.5 in | Wt 254.4 lb

## 2022-10-17 DIAGNOSIS — G8929 Other chronic pain: Secondary | ICD-10-CM

## 2022-10-17 DIAGNOSIS — M1712 Unilateral primary osteoarthritis, left knee: Secondary | ICD-10-CM | POA: Diagnosis not present

## 2022-10-17 DIAGNOSIS — M25562 Pain in left knee: Secondary | ICD-10-CM

## 2022-10-17 DIAGNOSIS — M25862 Other specified joint disorders, left knee: Secondary | ICD-10-CM

## 2022-10-17 NOTE — Progress Notes (Signed)
Spoke w/ via phone for pre-op interview---pt Lab needs dos----I-stat, EKG            Lab results------n/a COVID test -----patient states asymptomatic no test needed Arrive at ------- NPO after MN NO Solid Food.  Clear liquids from MN until---0945 Med rec completed Medications to take morning of surgery -----Wellbutrin and Tamoxifen  Diabetic medication -----hold metformin Patient instructed no nail polish to be worn day of surgery Patient instructed to bring photo id and insurance card day of surgery Patient aware to have Driver (ride ) /  daughter  Alcario Drought caregiver    for 24 hours after surgery  Patient Special Instructions -----none Pre-Op special Istructions -----n/a Patient verbalized understanding of instructions that were given at this phone interview. Patient denies shortness of breath, chest pain, fever, cough at this phone interview.

## 2022-10-17 NOTE — Patient Instructions (Signed)
Thank you for coming in today.   You should hear from MRI scheduling within 1 week. If you do not hear please let me know.     Recheck with me following the MRI.

## 2022-10-22 ENCOUNTER — Ambulatory Visit (HOSPITAL_BASED_OUTPATIENT_CLINIC_OR_DEPARTMENT_OTHER)
Admission: RE | Admit: 2022-10-22 | Discharge: 2022-10-22 | Disposition: A | Discharge status: Home / Self Care | Payer: Medicare HMO | Source: Ambulatory Visit | Attending: Urology | Admitting: Urology

## 2022-10-22 ENCOUNTER — Ambulatory Visit (HOSPITAL_BASED_OUTPATIENT_CLINIC_OR_DEPARTMENT_OTHER): Payer: Medicare HMO | Admitting: Anesthesiology

## 2022-10-22 ENCOUNTER — Encounter (HOSPITAL_BASED_OUTPATIENT_CLINIC_OR_DEPARTMENT_OTHER)
Admission: RE | Disposition: A | Discharge status: Home / Self Care | Payer: Self-pay | Source: Ambulatory Visit | Attending: Urology

## 2022-10-22 ENCOUNTER — Encounter (HOSPITAL_BASED_OUTPATIENT_CLINIC_OR_DEPARTMENT_OTHER): Payer: Self-pay | Admitting: Urology

## 2022-10-22 DIAGNOSIS — G4730 Sleep apnea, unspecified: Secondary | ICD-10-CM | POA: Diagnosis not present

## 2022-10-22 DIAGNOSIS — J449 Chronic obstructive pulmonary disease, unspecified: Secondary | ICD-10-CM

## 2022-10-22 DIAGNOSIS — Z87891 Personal history of nicotine dependence: Secondary | ICD-10-CM | POA: Insufficient documentation

## 2022-10-22 DIAGNOSIS — M549 Dorsalgia, unspecified: Secondary | ICD-10-CM

## 2022-10-22 DIAGNOSIS — R69 Illness, unspecified: Secondary | ICD-10-CM | POA: Diagnosis not present

## 2022-10-22 DIAGNOSIS — R7303 Prediabetes: Secondary | ICD-10-CM

## 2022-10-22 DIAGNOSIS — I10 Essential (primary) hypertension: Secondary | ICD-10-CM | POA: Diagnosis not present

## 2022-10-22 DIAGNOSIS — N3946 Mixed incontinence: Secondary | ICD-10-CM | POA: Insufficient documentation

## 2022-10-22 DIAGNOSIS — Z09 Encounter for follow-up examination after completed treatment for conditions other than malignant neoplasm: Secondary | ICD-10-CM | POA: Insufficient documentation

## 2022-10-22 DIAGNOSIS — F418 Other specified anxiety disorders: Secondary | ICD-10-CM | POA: Diagnosis not present

## 2022-10-22 DIAGNOSIS — Z79899 Other long term (current) drug therapy: Secondary | ICD-10-CM | POA: Diagnosis not present

## 2022-10-22 HISTORY — PX: CYSTOSCOPY WITH INJECTION: SHX1424

## 2022-10-22 HISTORY — PX: BOTOX INJECTION: SHX5754

## 2022-10-22 LAB — POCT I-STAT, CHEM 8
BUN: 20 mg/dL (ref 8–23)
Calcium, Ion: 1.16 mmol/L (ref 1.15–1.40)
Chloride: 98 mmol/L (ref 98–111)
Creatinine, Ser: 0.7 mg/dL (ref 0.44–1.00)
Glucose, Bld: 97 mg/dL (ref 70–99)
HCT: 38 % (ref 36.0–46.0)
Hemoglobin: 12.9 g/dL (ref 12.0–15.0)
Potassium: 3.9 mmol/L (ref 3.5–5.1)
Sodium: 138 mmol/L (ref 135–145)
TCO2: 30 mmol/L (ref 22–32)

## 2022-10-22 SURGERY — CYSTOSCOPY, WITH INJECTION OF BLADDER NECK OR BLADDER WALL
Anesthesia: General | Site: Urethra

## 2022-10-22 MED ORDER — SODIUM CHLORIDE 0.9 % IR SOLN
Status: DC | PRN
  Administered 2022-10-22: 3000 mL

## 2022-10-22 MED ORDER — PROPOFOL 10 MG/ML IV BOLUS
INTRAVENOUS | Status: DC | PRN
  Administered 2022-10-22: 20 mg via INTRAVENOUS
  Administered 2022-10-22: 200 ug/kg/min via INTRAVENOUS
  Administered 2022-10-22: 10 mg via INTRAVENOUS

## 2022-10-22 MED ORDER — PROPOFOL 500 MG/50ML IV EMUL
INTRAVENOUS | Status: AC
  Filled 2022-10-22: qty 50

## 2022-10-22 MED ORDER — CEFAZOLIN SODIUM-DEXTROSE 2-4 GM/100ML-% IV SOLN
INTRAVENOUS | Status: AC
  Filled 2022-10-22: qty 100

## 2022-10-22 MED ORDER — ACETAMINOPHEN 500 MG PO TABS
ORAL_TABLET | ORAL | Status: AC
  Filled 2022-10-22: qty 2

## 2022-10-22 MED ORDER — 0.9 % SODIUM CHLORIDE (POUR BTL) OPTIME
TOPICAL | Status: DC | PRN
  Administered 2022-10-22: 500 mL

## 2022-10-22 MED ORDER — FENTANYL CITRATE (PF) 100 MCG/2ML IJ SOLN
INTRAMUSCULAR | Status: AC
  Filled 2022-10-22: qty 2

## 2022-10-22 MED ORDER — MIDAZOLAM HCL 2 MG/2ML IJ SOLN
INTRAMUSCULAR | Status: AC
  Filled 2022-10-22: qty 2

## 2022-10-22 MED ORDER — ACETAMINOPHEN 500 MG PO TABS
1000.0000 mg | ORAL_TABLET | Freq: Once | ORAL | Status: AC
  Administered 2022-10-22: 1000 mg via ORAL

## 2022-10-22 MED ORDER — PROPOFOL 10 MG/ML IV BOLUS
INTRAVENOUS | Status: AC
  Filled 2022-10-22: qty 20

## 2022-10-22 MED ORDER — ONABOTULINUMTOXINA 100 UNITS IJ SOLR
INTRAMUSCULAR | Status: DC | PRN
  Administered 2022-10-22: 100 [IU] via INTRAMUSCULAR

## 2022-10-22 MED ORDER — ONDANSETRON HCL 4 MG/2ML IJ SOLN
INTRAMUSCULAR | Status: AC
  Filled 2022-10-22: qty 2

## 2022-10-22 MED ORDER — ONDANSETRON HCL 4 MG/2ML IJ SOLN
INTRAMUSCULAR | Status: DC | PRN
  Administered 2022-10-22: 4 mg via INTRAVENOUS

## 2022-10-22 MED ORDER — SODIUM CHLORIDE (PF) 0.9 % IJ SOLN
INTRAMUSCULAR | Status: DC | PRN
  Administered 2022-10-22: 20 mL

## 2022-10-22 MED ORDER — CEFAZOLIN SODIUM-DEXTROSE 2-4 GM/100ML-% IV SOLN
2.0000 g | INTRAVENOUS | Status: AC
  Administered 2022-10-22: 2 g via INTRAVENOUS

## 2022-10-22 MED ORDER — LACTATED RINGERS IV SOLN: INTRAVENOUS | Status: DC

## 2022-10-22 MED ORDER — FENTANYL CITRATE (PF) 100 MCG/2ML IJ SOLN: 25.0000 ug | INTRAMUSCULAR | Status: DC | PRN

## 2022-10-22 MED ORDER — FENTANYL CITRATE (PF) 100 MCG/2ML IJ SOLN
INTRAMUSCULAR | Status: DC | PRN
  Administered 2022-10-22: 25 ug via INTRAVENOUS
  Administered 2022-10-22: 50 ug via INTRAVENOUS

## 2022-10-22 SURGICAL SUPPLY — 24 items
BAG DRAIN URO-CYSTO SKYTR STRL (DRAIN) ×1 IMPLANT
BAG DRN UROCATH (DRAIN) ×1
CLOTH BEACON ORANGE TIMEOUT ST (SAFETY) ×1 IMPLANT
ELECT REM PT RETURN 9FT ADLT (ELECTROSURGICAL)
ELECTRODE REM PT RTRN 9FT ADLT (ELECTROSURGICAL) ×1 IMPLANT
GLOVE BIO SURGEON STRL SZ 6.5 (GLOVE) ×1 IMPLANT
GLOVE BIOGEL PI IND STRL 6.5 (GLOVE) IMPLANT
GOWN STRL REUS W/ TWL LRG LVL3 (GOWN DISPOSABLE) IMPLANT
GOWN STRL REUS W/TWL LRG LVL3 (GOWN DISPOSABLE) ×2 IMPLANT
IV NS IRRIG 3000ML ARTHROMATIC (IV SOLUTION) IMPLANT
KIT TURNOVER CYSTO (KITS) ×1 IMPLANT
MANIFOLD NEPTUNE II (INSTRUMENTS) ×1 IMPLANT
NDL ASPIRATION 22 (NEEDLE) ×1 IMPLANT
NDL SAFETY ECLIP 18X1.5 (MISCELLANEOUS) ×1 IMPLANT
NEEDLE ASPIRATION 22 (NEEDLE) ×1 IMPLANT
NS IRRIG 500ML POUR BTL (IV SOLUTION) IMPLANT
PACK CYSTO (CUSTOM PROCEDURE TRAY) ×1 IMPLANT
SLEEVE SCD COMPRESS KNEE MED (STOCKING) ×1 IMPLANT
SYR 20ML LL LF (SYRINGE) ×1 IMPLANT
SYR CONTROL 10ML LL (SYRINGE) ×1 IMPLANT
SYSTEM URETHRAL BULK BULKAMID (Female Continence) IMPLANT
TUBE CONNECTING 12X1/4 (SUCTIONS) ×1 IMPLANT
TUBING UROLOGY SET (TUBING) ×1 IMPLANT
WATER STERILE IRR 3000ML UROMA (IV SOLUTION) ×1 IMPLANT

## 2022-10-22 NOTE — Transfer of Care (Signed)
Immediate Anesthesia Transfer of Care Note  Patient: Kristi Hunter  Procedure(s) Performed: CYSTOSCOPY WITH INJECTION OF BULKAMID (Urethra) BOTOX INJECTION 100 UNITS (Urethra)  Patient Location: PACU  Anesthesia Type:MAC  Level of Consciousness: awake, alert , and patient cooperative  Airway & Oxygen Therapy: Patient Spontanous Breathing  Post-op Assessment: Report given to RN and Post -op Vital signs reviewed and stable  Post vital signs: Reviewed and stable  Last Vitals:  Vitals Value Taken Time  BP 144/68 10/22/22 1345  Temp    Pulse 87 10/22/22 1345  Resp 18 10/22/22 1345  SpO2 98 % 10/22/22 1345  Vitals shown include unvalidated device data.  Last Pain:  Vitals:   10/22/22 1015  TempSrc: Oral  PainSc: 1       Patients Stated Pain Goal: 4 ([id number])  Complications: No notable events documented.

## 2022-10-22 NOTE — Anesthesia Postprocedure Evaluation (Signed)
Anesthesia Post Note  Patient: Kristi Hunter  Procedure(s) Performed: CYSTOSCOPY WITH INJECTION OF BULKAMID (Urethra) BOTOX INJECTION 100 UNITS (Urethra)     Patient location during evaluation: PACU Anesthesia Type: General Level of consciousness: awake and alert Pain management: pain level controlled Vital Signs Assessment: post-procedure vital signs reviewed and stable Respiratory status: spontaneous breathing, nonlabored ventilation and respiratory function stable Cardiovascular status: blood pressure returned to baseline Postop Assessment: no apparent nausea or vomiting Anesthetic complications: no   No notable events documented.  Last Vitals:  Vitals:   10/22/22 1015 10/22/22 1345  BP: (!) 158/91 (!) 144/68  Pulse: 89   Resp: 18   Temp: 36.4 C 36.4 C  SpO2: 97%     Last Pain:  Vitals:   10/22/22 1345  TempSrc:   PainSc: 0-No pain                 Marthenia Rolling

## 2022-10-22 NOTE — Interval H&P Note (Signed)
History and Physical Interval Note:  10/22/2022 11:03 AM  Kristi Hunter  has presented today for surgery, with the diagnosis of MIXED URINARY INCONTINENCE.  The various methods of treatment have been discussed with the patient and family. After consideration of risks, benefits and other options for treatment, the patient has consented to  Procedure(s) with comments: CYSTOSCOPY WITH INJECTION OF BULKAMID (N/A) - 45 MINS BOTOX INJECTION 100 UNITS (N/A) as a surgical intervention.  The patient's history has been reviewed, patient examined, no change in status, stable for surgery.  I have reviewed the patient's chart and labs.  Questions were answered to the patient's satisfaction.     Zaynah Chawla D Linzi Ohlinger

## 2022-10-22 NOTE — Anesthesia Preprocedure Evaluation (Addendum)
Anesthesia Evaluation  Patient identified by MRN, date of birth, ID band Patient awake    Reviewed: Allergy & Precautions, NPO status , Patient's Chart, lab work & pertinent test results  History of Anesthesia Complications Negative for: history of anesthetic complications  Airway Mallampati: III  TM Distance: >3 FB Neck ROM: Full    Dental no notable dental hx.    Pulmonary sleep apnea , COPD, former smoker   Pulmonary exam normal        Cardiovascular hypertension, Pt. on medications Normal cardiovascular exam     Neuro/Psych   Anxiety Depression       GI/Hepatic negative GI ROS, Neg liver ROS,,,  Endo/Other    Morbid obesityPrediabetes on metformin  Renal/GU    MIXED URINARY INCONTINENCE    Musculoskeletal  (+) Arthritis ,    Abdominal   Peds  Hematology   Anesthesia Other Findings glaucoma  Reproductive/Obstetrics                              Anesthesia Physical Anesthesia Plan  ASA: 3  Anesthesia Plan: General   Post-op Pain Management: Tylenol PO (pre-op)*   Induction: Intravenous  PONV Risk Score and Plan: 3 and Treatment may vary due to age or medical condition, Ondansetron and Propofol infusion  Airway Management Planned: Natural Airway and Simple Face Mask  Additional Equipment: None  Intra-op Plan:   Post-operative Plan:   Informed Consent: I have reviewed the patients History and Physical, chart, labs and discussed the procedure including the risks, benefits and alternatives for the proposed anesthesia with the patient or authorized representative who has indicated his/her understanding and acceptance.       Plan Discussed with: CRNA  Anesthesia Plan Comments:         Anesthesia Quick Evaluation

## 2022-10-22 NOTE — Op Note (Signed)
Operative Note   Preoperative diagnosis:  1.  Mixed urinary incontinence   Postoperative diagnosis: 1.  Mixed urinary incontinence   Procedure(s): 1.  Cystoscopy with injection of bulkamid 2.  Cystoscopy with intravesical botox 100 units   Surgeon: Jacalyn Lefevre, MD   Assistants:  None   Anesthesia:  General   Complications:  None   EBL:  minimal   Specimens: 1. none   Drains/Catheters: 1.  none   Intraoperative findings:   Normal urethra   Indication:  82 yo woman with symptomatic mixed urinary incontinence.   Description of procedure:   After risks and benefits of the procedure discussed with the patient, informed consent was obtained.  The patient was taken to the operating placed in the supine position.  Anesthesia was induced and antibiotics were administered.  The patient was then repositioned in the dorsolithotomy position.  She was prepped and draped in usual sterile fashion a timeout performed with the attending present.  The injection cystoscope was then placed in the urethral meatus and advanced in the bladder and direct visualization.  Diagnostic cystoscopy took place and no abnormalities were noted.  100 units of Botox mixed with 20 cc sterile saline were then injected in standard template over the posterior wall the bladder taking care to avoid the ureteral orifices.  Next, the cystoscope was assembled with the Bulkamid system.  It was then placed in the urethral meatus and advanced into the bladder under direct visualization.  Prior cystoscopy had been done which noted normal anatomic landmarks.  These were again verified during cystoscopy today.  The cystoscope was brought back to the bladder neck and the needle was advanced through the needle guide at the 1 o'clock position.  Once it was visualized and advanced it was rotated to the 5 o'clock position.  Bulkamid was then injected until blood was seen.  This was then repeated at the 1 o'clock position in  the 7 o'clock position until coaptation was noted.   This concluded the case.  The patient's bladder was left with approximately 200 cc of sterile saline.  The patient emerged from anesthesia and was transferred the PACU in stable condition.   Plan:  Plan for patient to void in PACU prior to discharge.

## 2022-10-22 NOTE — Anesthesia Procedure Notes (Signed)
Procedure Name: MAC Date/Time: 10/22/2022 1:11 PM  Performed by: Rogers Blocker, CRNAPre-anesthesia Checklist: Patient identified, Emergency Drugs available, Suction available and Patient being monitored Patient Re-evaluated:Patient Re-evaluated prior to induction Oxygen Delivery Method: Simple face mask Placement Confirmation: positive ETCO2 Dental Injury: Teeth and Oropharynx as per pre-operative assessment

## 2022-10-22 NOTE — H&P (Signed)
CC/HPI: cc: Urinary frequency/nocturia/mixed urinary incontinence   11/19/19: 82 year old woman with a 5 year history of nocturia, daytime frequency and mixed urinary incontinence. She says over the last month she began urinating as much as 6 times at night with a baseline 4 times per night. She also has daytime frequency and mixed urinary incontinence but the urge incontinence is more bothersome to her. She denies UTIs, gross hematuria or history of kidney stones. She says she drinks mostly water during the day and may have a glass or glass and half of wine in the evening. She goes to bed at 11:00 p.m. and wakes at 8:00 a.m.Marland Kitchen Patient says she has tried cutting out caffeine and alcohol in the past without success. She has tried Myrbetriq previously and did not notice any improvement. She has tried pelvic floor physical therapy in the past and noted some improvement with that. She denies snoring but has never had a sleep study for sleep apnea.   01/04/20: 82 year old woman returns for follow-up after starting pelvic floor physical therapy in behavior modification with stopping fluids 3 to 1st prior to bedtime. Patient says the pelvic floor physical therapy has helped her and she is not as wet at nighttime. When she removes stop drinking fluids before bed this also helps her. She is not sure getting a sleep study because she feels she does not have sleep apnea. She reports significant left lower extremity pain that felt better after massage.   01/27/20: PTNS#1; she has tried 3 months of Myrbetriq at 50 mg but has not helped her enough for how expensive it is. Patient is also starting pelvic floor physical therapy.   04/20/20: PTNS#12; patient is doing well with PTNS and pelvic floor physical therapy. She is able to sleep longer stretches at night. She is going to continue with maintenance PTNS.   04/05/22: 82 yo woman longstanding for nocturia and mixed urinary continence here for follow-up. She was doing well with  weekly PTNS and pelvic floor physical therapy however when she moved to maintenance PTNS her symptoms have returned. She is now having worsening urge incontinence at nighttime. The urge wakes her up every 2-3 hours and before she stands she she starts voiding. She does not leak during the daytime. The longest stretch she gets of sleep at night is 4 hours. She had previously tried Myrbetriq without success.    05/08/22: 82 yo woman with longstanding nocturia and mixed urinary incontinence here for follow up after UDS. UDS findings noted below. Patient has started [place] and would like to give this time to see if this works. She is also still doing maintenance PTNS. She is traveling to [place] in January and would like to come back in February for follow-up. I did discuss intravesical Botox injections with patient previously and again today.   UDS SUMMARY  Mr. Weismann held a max capacity of approx. 170 mls. Her 1st sensation was felt at 28 mls. No SUI noted. No instability was noted. Patient was also filled twice during this study. She was able to generate a voluntary contraction and void 114 mls with max flow of 7 ml/s(2nd void). EMG leads were basically quiet during the voiding phase. PVR was approx. 56 mls. She did empty completely with her first void. No trabeculation was noted. No reflux was seen. She will keep follow up UDS appointment.    10/04/2022: 82 year old woman with longstanding history of nocturia and mixed urinary incontinence here for follow-up. She has been on [place] and  also completed PTNS. She had a bad round of bronchitis and has been having significant leakage with coughing still. Urodynamic study as above. She is wearing depends now and is very tired of this.     ALLERGIES: Percocet    MEDICATIONS: Aspirin 81 mg tablet,chewable  Gemtesa 75 mg tablet 1 tablet PO Daily  Calcium + D  Chlorthalidone  Glucosamine Chondroitin  Melatonin  Multiple Vitamin  Naproxen  Trazodone Hcl   Vitamin D3     GU PSH: Complex cystometrogram, w/ void pressure and urethral pressure profile studies, any technique - 05/01/2022 Complex Uroflow - 05/01/2022 Emg surf Electrd - 05/01/2022 Inject For cystogram - 05/01/2022 Intrabd voidng Press - 05/01/2022     NON-GU PSH: Breast lumpectomy, Right - 02/20/2021 Hysterectomy Knee replacement, Right Neuroeltrd Stim Post Tibial - 08/15/2022, 07/11/2022, 06/06/2022, 05/02/2022, 04/02/2022, 03/05/2022, 02/05/2022, 01/08/2022, 12/11/2021, 11/13/2021, 10/25/2021, 09/27/2021, 08/17/2021, 07/17/2021, 06/22/2021, 06/01/2021, 05/11/2021, 04/17/2021, 03/23/2021, 03/02/2021, 02/09/2021, 01/19/2021, 12/26/2020, 12/07/2020, 11/17/2020, 10/27/2020, 10/06/2020, 2022, 08/10/2020, 07/20/2020, 06/29/2020, 06/08/2020, 05/25/2020, 05/11/2020, 04/27/2020, 04/20/2020, 04/13/2020, 04/06/2020, 2021, 2021, 2021, 2021, 2021, 2021, 2021, 2021, 2021     GU PMH: Mixed incontinence - 08/15/2022, - 07/11/2022, - 06/06/2022, - 05/08/2022, - 05/02/2022, - 04/05/2022, - 04/02/2022, - 03/05/2022, - 02/05/2022, - 01/08/2022, - 12/11/2021, - 11/13/2021, - 10/25/2021, - 09/27/2021, - 08/17/2021, - 07/17/2021, - 06/22/2021, - 06/01/2021, - 05/11/2021, - 04/17/2021, - 03/23/2021, - 03/02/2021, - 02/09/2021, - 01/19/2021, - 12/26/2020, - 12/07/2020, - 11/17/2020, - 10/27/2020, - 06/28/2020, - 05/23/2020, - 2021, - 2021, - 2021, - 2021, - 2021, - 2021, - 2021, - 2021 Urinary Frequency - 08/15/2022, - 07/11/2022, - 06/06/2022, - 05/08/2022, - 05/02/2022, - 05/01/2022, - 04/02/2022, - 03/05/2022, - 02/05/2022, - 01/08/2022, - 12/11/2021, - 11/13/2021, - 10/25/2021, - 09/27/2021, - 08/17/2021, - 07/17/2021, - 06/22/2021, - 06/01/2021, - 05/11/2021, - 04/17/2021, - 03/23/2021, - 03/02/2021, - 02/09/2021, - 01/19/2021, - 12/26/2020, - 12/07/2020, - 11/17/2020, - 10/27/2020, - 10/06/2020, - 2022, - 08/10/2020, - 07/20/2020, - 06/29/2020, - 06/08/2020, - 05/25/2020, - 05/11/2020, - 04/27/2020, Patient is doing well with her symptoms with PTNS and PFPT. Will continue maintanence PTNS. , - 04/20/2020, -  04/13/2020, - 2021 Urinary Urgency - 08/15/2022, - 07/11/2022, - 06/06/2022, - 05/08/2022, - 05/02/2022, - 05/01/2022, - 04/05/2022, - 04/02/2022, - 03/05/2022, - 02/05/2022, - 01/08/2022, - 12/11/2021, - 11/13/2021, - 10/25/2021, - 09/27/2021, - 08/17/2021, - 07/17/2021, - 06/22/2021, - 06/01/2021, - 05/11/2021, - 04/17/2021, - 03/23/2021, - 03/02/2021, - 02/09/2021, - 01/19/2021, - 12/26/2020, - 12/07/2020, - 11/17/2020, - 10/27/2020, - 10/06/2020, - 2022, - 08/10/2020, - 07/20/2020, - 06/29/2020, - 06/28/2020, - 06/08/2020, - 05/25/2020, - 05/23/2020, - 05/11/2020, - 04/27/2020, - 04/20/2020, - 04/13/2020, - 2021, - 2021, - 2021, - 2021, - 2021, - 2021, - 2021 Nocturia - 05/08/2022, - 04/05/2022, - 06/28/2020, I discussed with patient trying to pursue a sleep study to rule out sleep apnea which will make nighttime urination significantly worse. I have also recommended she stop drinking fluids 4 hours prior to bedtime. I discussed that alcohol is a bladder irritant and will also worsen nighttime urination. Patient is also going to try pelvic floor physical therapy as was complete a bladder diary. She will follow-up after pelvic floor physical therapy to reassess results. At that point we may consider a urodynamic study., - 2021 Gastric Cancer, History    NON-GU PMH: Muscle weakness (generalized) - 06/28/2020, - 05/23/2020, - 2021, - 2021, - 2021, - 2021, - 2021, - 2021, - 2021, - 2021 Other muscle spasm - 06/28/2020, -  05/23/2020, - 2021, - 2021, - 2021, - 2021, - 2021, - 2021, - 2021, - 2021 Other specified disorders of muscle - 06/28/2020, - 05/23/2020, - 2021, - 2021, - 2021, - 2021, - 2021, - 2021, - 2021, - 2021 Age-related osteoporosis without current pathological fracture Anxiety Edema, unspecified Glaucoma Hyperglycemia, unspecified Hypertension Major depressive disorder, single episode, in full remission Malignant neoplasm of unspecified site of unspecified female breast Mixed hyperlipidemia Osteoarthritis Other seborrheic  keratosis Prediabetes Rosacea, unspecified Sciatica, right side Unilateral primary osteoarthritis, unspecified knee Vitamin D deficiency, unspecified    FAMILY HISTORY: No Family History    SOCIAL HISTORY: Marital Status: Widowed Preferred Language: English; Ethnicity: Not Hispanic Or Latino; Race: White Current Smoking Status: Patient has never smoked.   Tobacco Use Assessment Completed: Used Tobacco in last 30 days? Does drink.  Drinks 2 caffeinated drinks per day.    REVIEW OF SYSTEMS:    GU Review Female:   Patient reports frequent urination, hard to postpone urination, and leakage of urine. Patient denies burning /pain with urination, get up at night to urinate, stream starts and stops, trouble starting your stream, have to strain to urinate, and being pregnant.  Gastrointestinal (Upper):   Patient denies nausea, vomiting, and indigestion/ heartburn.  Gastrointestinal (Lower):   Patient reports constipation. Patient denies diarrhea.  Constitutional:   Patient denies fever, night sweats, weight loss, and fatigue.  Skin:   Patient denies skin rash/ lesion and itching.  Eyes:   Patient denies blurred vision and double vision.  Ears/ Nose/ Throat:   Patient denies sore throat and sinus problems.  Hematologic/Lymphatic:   Patient denies swollen glands and easy bruising.  Cardiovascular:   Patient reports leg swelling. Patient denies chest pains.  Respiratory:   Patient denies cough and shortness of breath.  Endocrine:   Patient denies excessive thirst.  Musculoskeletal:   Patient reports joint pain. Patient denies back pain.  Neurological:   Patient denies dizziness and headaches.  Psychologic:   Patient denies depression and anxiety.   VITAL SIGNS: None   MULTI-SYSTEM PHYSICAL EXAMINATION:    Constitutional: Well-nourished. No physical deformities. Normally developed. Good grooming.  Neck: Neck symmetrical, not swollen. Normal tracheal position.  Respiratory: No labored  breathing, no use of accessory muscles.   Skin: No paleness, no jaundice, no cyanosis. No lesion, no ulcer, no rash.  Neurologic / Psychiatric: Oriented to time, oriented to place, oriented to person. No depression, no anxiety, no agitation.  Eyes: Normal conjunctivae. Normal eyelids.  Ears, Nose, Mouth, and Throat: Left ear no scars, no lesions, no masses. Right ear no scars, no lesions, no masses. Nose no scars, no lesions, no masses. Normal hearing. Normal lips.  Musculoskeletal: Normal gait and station of head and neck.     Complexity of Data:  Records Review:   Previous Patient Records, POC Tool  Urine Test Review:   Urinalysis  Urodynamics Review:   Review Bladder Scan, Review Urodynamics Tests  Notes:                     03/15/2022: BUN 18, creatinine 0.71, estimated GFR 81.3   PROCEDURES:         PVR Ultrasound - [id number]  Scanned Volume: 0 cc         Urinalysis Dipstick Dipstick Cont'd  Color: Yellow Bilirubin: Neg mg/dL  Appearance: Clear Ketones: Neg mg/dL  Specific Gravity: 1.020 Blood: Neg ery/uL  pH: 7.0 Protein: Neg mg/dL  Glucose: Neg mg/dL Urobilinogen: 0.2 mg/dL  Nitrites: Neg    Leukocyte Esterase: Neg leu/uL    ASSESSMENT:      ICD-10 Details  1 GU:   Mixed incontinence - N39.46 Chronic, Stable  2   Nocturia - R35.1 Chronic, Stable  3   Urinary Frequency - R35.0 Chronic, Stable  4   Urinary Urgency - R39.15 Chronic, Stable   PLAN:           Document Letter(s):  Created for Patient: Clinical Summary         Notes:   Mixed urinary incontinence:  -We again discussed urodynamics results and moving forward with Botox as well as Bulkamid. While stress urinary continence was not seen on urodynamic study she is continue to experience that and we could do both procedures at the same time. Risks and benefits of these were discussed with the patient in detail including but limited to pain, bleeding, infection, dysuria, urinary retention, damage strength structures.  Patient understands the need to possibly self catheterize or have an indwelling Foley catheter if Botox causes urinary retention.

## 2022-10-22 NOTE — Discharge Instructions (Addendum)
Cystoscopy with Botox and Bulkamid patient instructions  Following a cystoscopy, a catheter (a flexible rubber tube) is sometimes left in place to empty the bladder. This may cause some discomfort or a feeling that you need to urinate. Your doctor determines the period of time that the catheter will be left in place. You may have bloody urine for two to three days (Call your doctor if the amount of bleeding increases or does not subside).  You may pass blood clots in your urine, especially if you had a biopsy. It is not unusual to pass small blood clots and have some bloody urine a couple of weeks after your cystoscopy. Again, call your doctor if the bleeding does not subside. You may have: Dysuria (painful urination) Frequency (urinating often) Urgency (strong desire to urinate)  Botox will take 1-2 weeks to take effect.  Bulkamid should be noticeable in first few days. If you have difficulty emptying your bladder, please call our office.  These symptoms are common especially if medicine is instilled into the bladder or a ureteral stent is placed. Avoiding alcohol and caffeine, such as coffee, tea, and chocolate, may help relieve these symptoms. Drink plenty of water, unless otherwise instructed. Your doctor may also prescribe an antibiotic or other medicine to reduce these symptoms.  Cystoscopy results are available soon after the procedure; biopsy results usually take two to four days. Your doctor will discuss the results of your exam with you. Before you go home, you will be given specific instructions for follow-up care. Special Instructions:   If you are going home with a catheter in place do not take a tub bath until removed by your doctor.   You may resume your normal activities.   Do not drive or operate machinery if you are taking narcotic pain medicine.   Be sure to keep all follow-up appointments with your doctor.   Call Your Doctor If: The catheter is not draining You have severe  pain You are unable to urinate You have a fever over 101 You have severe bleeding          Post Anesthesia Home Care Instructions  Activity: Get plenty of rest for the remainder of the day. A responsible individual must stay with you for 24 hours following the procedure.  For the next 24 hours, DO NOT: -Drive a car -Paediatric nurse -Drink alcoholic beverages -Take any medication unless instructed by your physician -Make any legal decisions or sign important papers.  Meals: Start with liquid foods such as gelatin or soup. Progress to regular foods as tolerated. Avoid greasy, spicy, heavy foods. If nausea and/or vomiting occur, drink only clear liquids until the nausea and/or vomiting subsides. Call your physician if vomiting continues.  Special Instructions/Symptoms: Your throat may feel dry or sore from the anesthesia or the breathing tube placed in your throat during surgery. If this causes discomfort, gargle with warm salt water. The discomfort should disappear within 24 hours.

## 2022-10-23 DIAGNOSIS — R69 Illness, unspecified: Secondary | ICD-10-CM | POA: Diagnosis not present

## 2022-10-23 DIAGNOSIS — F321 Major depressive disorder, single episode, moderate: Secondary | ICD-10-CM | POA: Diagnosis not present

## 2022-10-24 ENCOUNTER — Encounter (HOSPITAL_BASED_OUTPATIENT_CLINIC_OR_DEPARTMENT_OTHER): Payer: Self-pay | Admitting: Urology

## 2022-11-05 DIAGNOSIS — N3946 Mixed incontinence: Secondary | ICD-10-CM | POA: Diagnosis not present

## 2022-11-06 DIAGNOSIS — R351 Nocturia: Secondary | ICD-10-CM | POA: Diagnosis not present

## 2022-11-06 DIAGNOSIS — M159 Polyosteoarthritis, unspecified: Secondary | ICD-10-CM | POA: Diagnosis not present

## 2022-11-06 DIAGNOSIS — R69 Illness, unspecified: Secondary | ICD-10-CM | POA: Diagnosis not present

## 2022-11-06 DIAGNOSIS — G4709 Other insomnia: Secondary | ICD-10-CM | POA: Diagnosis not present

## 2022-11-13 ENCOUNTER — Encounter (INDEPENDENT_AMBULATORY_CARE_PROVIDER_SITE_OTHER): Payer: Self-pay

## 2022-11-13 ENCOUNTER — Ambulatory Visit (INDEPENDENT_AMBULATORY_CARE_PROVIDER_SITE_OTHER): Payer: Medicare HMO | Admitting: Family Medicine

## 2022-11-15 ENCOUNTER — Ambulatory Visit
Admission: RE | Admit: 2022-11-15 | Discharge: 2022-11-15 | Disposition: A | Discharge status: Home / Self Care | Payer: Medicare HMO | Source: Ambulatory Visit | Attending: Family Medicine | Admitting: Family Medicine

## 2022-11-15 DIAGNOSIS — G8929 Other chronic pain: Secondary | ICD-10-CM

## 2022-11-15 DIAGNOSIS — M25862 Other specified joint disorders, left knee: Secondary | ICD-10-CM

## 2022-11-19 DIAGNOSIS — R69 Illness, unspecified: Secondary | ICD-10-CM | POA: Diagnosis not present

## 2022-11-19 DIAGNOSIS — F321 Major depressive disorder, single episode, moderate: Secondary | ICD-10-CM | POA: Diagnosis not present

## 2022-11-22 ENCOUNTER — Telehealth: Payer: Self-pay | Admitting: Family Medicine

## 2022-11-22 DIAGNOSIS — M25862 Other specified joint disorders, left knee: Secondary | ICD-10-CM

## 2022-11-22 DIAGNOSIS — G8929 Other chronic pain: Secondary | ICD-10-CM

## 2022-11-22 NOTE — Telephone Encounter (Unsigned)
Pt was unable to complete the MRI, even with meds she could not do it.  Pt interested in having the MRI at a hospital that will "knock her out". She would prefer [facility] or [facility] as her daughter can take her to those locations.

## 2022-11-25 ENCOUNTER — Encounter: Payer: Self-pay | Admitting: *Deleted

## 2022-11-25 NOTE — Telephone Encounter (Signed)
We will work to getthig the MRI with general anesthesia approved at your preferred location.  However your insurance may dictate locations.

## 2022-11-25 NOTE — Telephone Encounter (Signed)
Called [facility] and they can do anesthesia but has to say MAC anesthesia. Added that to the order. Faxed order and authorization for that location to this location. They will call patient to schedule.

## 2022-11-27 DIAGNOSIS — J208 Acute bronchitis due to other specified organisms: Secondary | ICD-10-CM | POA: Diagnosis not present

## 2022-11-27 DIAGNOSIS — R7303 Prediabetes: Secondary | ICD-10-CM | POA: Diagnosis not present

## 2022-11-27 DIAGNOSIS — G4733 Obstructive sleep apnea (adult) (pediatric): Secondary | ICD-10-CM | POA: Diagnosis not present

## 2022-12-03 ENCOUNTER — Ambulatory Visit (INDEPENDENT_AMBULATORY_CARE_PROVIDER_SITE_OTHER): Payer: Medicare HMO | Admitting: Family Medicine

## 2022-12-03 ENCOUNTER — Encounter (INDEPENDENT_AMBULATORY_CARE_PROVIDER_SITE_OTHER): Payer: Self-pay | Admitting: Family Medicine

## 2022-12-03 VITALS — BP 131/77 | HR 87 | Temp 98.1°F | Ht 63.0 in | Wt 255.0 lb

## 2022-12-03 DIAGNOSIS — R7303 Prediabetes: Secondary | ICD-10-CM

## 2022-12-03 DIAGNOSIS — F3289 Other specified depressive episodes: Secondary | ICD-10-CM | POA: Diagnosis not present

## 2022-12-03 DIAGNOSIS — F321 Major depressive disorder, single episode, moderate: Secondary | ICD-10-CM | POA: Diagnosis not present

## 2022-12-03 DIAGNOSIS — Z6841 Body Mass Index (BMI) 40.0 and over, adult: Secondary | ICD-10-CM

## 2022-12-03 DIAGNOSIS — E669 Obesity, unspecified: Secondary | ICD-10-CM | POA: Diagnosis not present

## 2022-12-03 DIAGNOSIS — F4389 Other reactions to severe stress: Secondary | ICD-10-CM | POA: Diagnosis not present

## 2022-12-03 DIAGNOSIS — R69 Illness, unspecified: Secondary | ICD-10-CM | POA: Diagnosis not present

## 2022-12-03 MED ORDER — METFORMIN HCL 500 MG PO TABS: 1000.0000 mg | ORAL_TABLET | Freq: Every day | ORAL | 0 refills | Status: DC

## 2022-12-03 NOTE — Progress Notes (Unsigned)
Chief Complaint:   OBESITY Kristi Hunter is here to discuss her progress with her obesity treatment plan along with follow-up of her obesity related diagnoses. [name] is on practicing portion control and making smarter food choices, such as increasing vegetables and decreasing simple carbohydrates and states she is following her eating plan approximately 20% of the time. [name] states she is doing 0 minutes 0 times per week.  Today's visit was #: 33 Starting weight: 267 lbs Starting date: 02/22/2020 Today's weight: 255 lbs Today's date: 12/03/2022 Total lbs lost to date: 12 Total lbs lost since last in-office visit: 0  Interim History: Kristi Hunter has been traveling and having to eat out more.  She feels she is retaining fluid.  She is working on getting back on track with portion control and making smarter food choices.  She feels her simple carbohydrates have increased.  Subjective:   1. Pre-diabetes Kristi Hunter continues to work on her diet to help decrease the risk of developing diabetes mellitus.  She is stable on metformin and she needs a refill.  2. Emotional Eating Behavior Kristi Hunter is not on bupropion as she feels she is doing better with decreasing emotional eating behavior.  She is trying to decrease the number of medication she takes.  Assessment/Plan:   1. Pre-diabetes Kristi Hunter will continue metformin 500 mg 2 tablets once daily, and we will refill for 1 month.  - metFORMIN (GLUCOPHAGE) 500 MG tablet; Take 2 tablets (1,000 mg total) by mouth daily with breakfast.  Dispense: 60 tablet; Refill: 0  2. Emotional Eating Behavior Kristi Hunter agreed to discontinue Wellbutrin, and emotional eating behavior strategies were discussed.  3. BMI 45.0-49.9, adult  4. Obesity, Beginning BMI 48.83 Kristi Hunter is currently in the action stage of change. As such, her goal is to continue with weight loss efforts. She has agreed to practicing portion control and making smarter food  choices, such as increasing vegetables and decreasing simple carbohydrates.   Behavioral modification strategies: increasing lean protein intake, decreasing simple carbohydrates, and increasing vegetables.  Kristi Hunter has agreed to follow-up with our clinic in 4 weeks. She was informed of the importance of frequent follow-up visits to maximize her success with intensive lifestyle modifications for her multiple health conditions.   Objective:   Blood pressure 131/77, pulse 87, temperature 98.1 F (36.7 C), height  (1.6 m), weight 255 lb (115.7 kg), SpO2 95 %. Body mass index is 45.17 kg/m.  Lab Results  Component Value Date   CREATININE 0.70 10/22/2022   BUN 20 10/22/2022   NA 138 10/22/2022   K 3.9 10/22/2022   CL 98 10/22/2022   CO2 31 (A) 08/23/2022   Lab Results  Component Value Date   ALT 15 08/23/2022   AST 15 08/23/2022   ALKPHOS 43 03/15/2022   BILITOT 0.3 03/15/2022   Lab Results  Component Value Date   HGBA1C 5.8 08/23/2022   HGBA1C 5.9 (H) 02/20/2022   HGBA1C 5.8 (H) 12/25/2021   HGBA1C 5.7 (H) 04/12/2021   HGBA1C 5.8 (H) 02/19/2021   Lab Results  Component Value Date   INSULIN 22.5 02/20/2022   INSULIN 37.9 (H) 04/12/2021   INSULIN 21.5 02/22/2020   Lab Results  Component Value Date   TSH 3.10 08/23/2022   Lab Results  Component Value Date   CHOL 161 08/23/2022   HDL 64 08/23/2022   LDLCALC 84 08/23/2022   TRIG 58 08/23/2022   CHOLHDL 3 01/13/2019   Lab Results  Component Value Date  VD25OH 32.7 02/20/2022   VD25OH 35.1 12/25/2021   VD25OH 46.8 04/12/2021   Lab Results  Component Value Date   WBC 9.2 08/23/2022   HGB 12.9 10/22/2022   HCT 38.0 10/22/2022   MCV 87 02/20/2022   PLT 350 08/23/2022   No results found for: "IRON", "TIBC", "FERRITIN"  Attestation Statements:   Reviewed by clinician on day of visit: allergies, medications, problem list, medical history, surgical history, family history, social history, and previous  encounter notes.  Time spent on visit including pre-visit chart review and post-visit care and charting was 30 minutes.   I, Burt Knack, am acting as transcriptionist for Quillian Quince, MD.  I have reviewed the above documentation for accuracy and completeness, and I agree with the above. -  Quillian Quince, MD

## 2022-12-11 DIAGNOSIS — N3946 Mixed incontinence: Secondary | ICD-10-CM | POA: Diagnosis not present

## 2022-12-19 ENCOUNTER — Other Ambulatory Visit: Payer: Self-pay

## 2022-12-19 ENCOUNTER — Inpatient Hospital Stay: Payer: Medicare HMO | Attending: Hematology and Oncology | Admitting: Hematology and Oncology

## 2022-12-19 VITALS — BP 158/66 | HR 89 | Temp 97.7°F | Resp 17 | Wt 258.6 lb

## 2022-12-19 DIAGNOSIS — Z79810 Long term (current) use of selective estrogen receptor modulators (SERMs): Secondary | ICD-10-CM | POA: Diagnosis not present

## 2022-12-19 DIAGNOSIS — Z90710 Acquired absence of both cervix and uterus: Secondary | ICD-10-CM | POA: Insufficient documentation

## 2022-12-19 DIAGNOSIS — C50411 Malignant neoplasm of upper-outer quadrant of right female breast: Secondary | ICD-10-CM

## 2022-12-19 DIAGNOSIS — Z808 Family history of malignant neoplasm of other organs or systems: Secondary | ICD-10-CM | POA: Insufficient documentation

## 2022-12-19 DIAGNOSIS — Z803 Family history of malignant neoplasm of breast: Secondary | ICD-10-CM | POA: Diagnosis not present

## 2022-12-19 DIAGNOSIS — Z87891 Personal history of nicotine dependence: Secondary | ICD-10-CM | POA: Insufficient documentation

## 2022-12-19 DIAGNOSIS — M810 Age-related osteoporosis without current pathological fracture: Secondary | ICD-10-CM | POA: Insufficient documentation

## 2022-12-19 DIAGNOSIS — Z170 Estrogen receptor positive status [ER+]: Secondary | ICD-10-CM | POA: Diagnosis not present

## 2022-12-19 DIAGNOSIS — Z800 Family history of malignant neoplasm of digestive organs: Secondary | ICD-10-CM | POA: Insufficient documentation

## 2022-12-19 NOTE — Progress Notes (Signed)
BRIEF ONCOLOGIC HISTORY:  Oncology History  Malignant neoplasm of upper-outer quadrant of right breast in female, estrogen receptor positive (HCC)  02/01/2021 Imaging   EXAM: ULTRASOUND OF THE RIGHT BREAST  FINDINGS: Targeted ultrasound is performed, showing a 10 x 7 x 8 mm irregular hypoechoic mass right breast 10 o'clock position 1 cm from the nipple. No right axillary adenopathy.   IMPRESSION: Suspicious right breast mass 10 o'clock position.   02/05/2021 Initial Biopsy   Diagnosis Breast, right, needle core biopsy, 10 o'clock, 1cmfn - INVASIVE DUCTAL CARCINOMA WITH CALCIFICATIONS. SEE NOTE - DUCTAL CARCINOMA IN SITU, INTERMEDIATE GRADE Diagnosis Note Carcinoma measures 0.7 cm in greatest linear dimension and appears grade 2.  PROGNOSTIC INDICATORS Results: The tumor cells are EQUIVOCAL for Her2 (2+). Her2 by FISH will be performed and results reported separately. Estrogen Receptor: 100%, POSITIVE, STRONG STAINING INTENSITY Progesterone Receptor: 90%, POSITIVE, STRONG STAINING INTENSITY Proliferation Marker Ki67: 20%  FLUORESCENCE IN-SITU HYBRIDIZATION Results: GROUP 5: HER2 **NEGATIVE**   02/08/2021 Initial Diagnosis   Malignant neoplasm of upper-outer quadrant of right breast in female, estrogen receptor positive (HCC)   02/14/2021 Cancer Staging   Staging form: Breast, AJCC 8th Edition - Clinical stage from 02/14/2021: Stage IA (cT1b, cN0, cM0, G2, ER+, PR+, HER2-) - Signed by Lowella Dell, MD on 02/14/2021 Stage prefix: Initial diagnosis Histologic grading system: 3 grade system   02/20/2021 Cancer Staging   Staging form: Breast, AJCC 8th Edition - Pathologic stage from 02/20/2021: Stage IA (pT1c, pN0, cM0, G2, ER+, PR+, HER2-) - Signed by Loa Socks, NP on 08/07/2021 Histologic grading system: 3 grade system   02/20/2021 Definitive Surgery   FINAL MICROSCOPIC DIAGNOSIS:   A. BREAST, RIGHT, LUMPECTOMY:  - Invasive and in situ ductal carcinoma, 1.2  cm.  - Invasive carcinoma focally 0.1 cm from medial margin and 0.2 cm from anterior margin.  - Biopsy site and biopsy clip.  - See oncology table.    02/20/2021 Oncotype testing   Oncotype DX was obtained on the final surgical sample and the recurrence score of 8 predicts a risk of recurrence outside the breast over the next 9 years of 3%, if the patient's only systemic therapy is an antiestrogen for 5 years.  It also predicts no benefit from chemotherapy.    02/23/2021 Genetic Testing   Negative hereditary cancer genetic testing: no pathogenic variants detected in [facility] CustomNext-Cancer +RNAinsight Panel.  The report date is February 23, 2021.    The CustomNext-Cancer +RNAinsight Panel offered by [facility] and includes sequencing and rearrangement analysis for the following 91 genes: AIP, ALK, APC, ATM, AXIN2, BAP1, BARD1, BLM, BMPR1A, BRCA1, BRCA2, BRIP1, CDC73, CDH1, CDK4, CDKN1B, CDKN2A, CHEK2, CTNNA1, DICER1, FANCC, FH, FLCN, GALNT12, KIF1B, LZTR1, MAX, MEN1, MET, MLH1, MRE11A, MSH2, MSH3, MSH6, MUTYH, NBN, NF1, NF2, NTHL1, PALB2, PHOX2B, PMS2, POT1, PRKAR1A, PTCH1, PTEN, RAD50, RAD51C, RAD51D, RB1, RECQL, RET, SDHA, SDHAF2, SDHB, SDHC, SDHD, SMAD4, SMARCA4, SMARCB1, SMARCE1, STK11, SUFU, TMEM127, TP53, TSC1, TSC2, VHL and XRCC2 (sequencing and deletion/duplication); CASR, CFTR, CPA1, CTRC, EGFR, EGLN1, FAM175A, HOXB13, KIT, MITF, MLH3, PALLD, PDGFRA, POLD1, POLE, PRSS1, RINT1, RPS20, SPINK1 and TERT (sequencing only); EPCAM and GREM1 (deletion/duplication only). RNA data is routinely analyzed for use in variant interpretation for all genes.   05/02/2021 - 04/2026 Anti-estrogen oral therapy   tamoxifen started 05/02/2021             (A) s/p remote hysterectomy (for uterine cancer)             (  B) osteoporosis with bone density 2020 T score -2.6     INTERVAL HISTORY:   Kristi Hunter is here for follow-up.   She was supposed to do a mammogram in November, but she said she was too sick from  bronchitis, she didn't schedule it. She was specifically asked to call us if she couldn't get this done, she said she forgot and lost track of time. She has been taking tamoxifen as prescribed. No concerns from tamoxifen. She complains of bilateral LE swelling, left greater than right but she wonders if this is because she has a bad knee on that side. She also feels more winded and she thinks its because she doesn't exercise as much. She had episodes of bronchitis, no pneumonia. She is anticipating knee replacement but she can't get this done unless she loses about 50 lbs of weight. No vaginal bleeding. No new dental concerns. Rest of the pertinent 10 point ROS reviewed and negative  REVIEW OF SYSTEMS:  Review of Systems  Constitutional:  Negative for appetite change, chills, fatigue, fever and unexpected weight change.  HENT:   Negative for hearing loss, lump/mass, mouth sores and trouble swallowing.   Eyes:  Negative for eye problems and icterus.  Respiratory:  Positive for shortness of breath. Negative for chest tightness and cough.   Cardiovascular:  Positive for leg swelling. Negative for chest pain and palpitations.  Gastrointestinal:  Positive for constipation. Negative for abdominal distention, abdominal pain, diarrhea, nausea and vomiting.  Endocrine: Negative for hot flashes.  Genitourinary:  Negative for difficulty urinating.   Musculoskeletal:  Negative for arthralgias.  Skin:  Negative for itching and rash.  Neurological:  Negative for dizziness, extremity weakness, headaches and numbness.  Hematological:  Negative for adenopathy. Does not bruise/bleed easily.  Psychiatric/Behavioral:  Negative for depression. The patient is not nervous/anxious.    Breast: Denies any new nodularity, masses, tenderness, nipple changes, or nipple discharge.      ONCOLOGY TREATMENT TEAM:  1. Surgeon:  Dr. Donell Beers at [facility] 2. Medical Oncologist: Dr. Al Pimple  3. Radiation  Oncologist: Dr. Mitzi Hansen    PAST MEDICAL/SURGICAL HISTORY:  Past Medical History:  Diagnosis Date   Anemia    Anxiety    Arthritis    several different parts of the body per pt 10/17/2022   Back pain    Breast cancer (HCC)    02/20/21   Cancer (HCC)    uterine   Chest pain    Chicken pox    Chronic kidney disease    Constipation    Emphysema of lung (HCC)    Family history of breast cancer 02/14/2021   Glaucoma    Hypertension    Joint pain    Lower extremity edema    Lymphedema    Osteoarthritis    Personal history of malignant neoplasm of uterus 02/14/2021   Pre-diabetes    Prediabetes    Shortness of breath    Sleep apnea    doesn't use CPAP machine anymore   Urine incontinence    Uterine cancer (HCC)    2011   UTI (urinary tract infection)    Vertigo    had vertigo last week. 10/17/2022   Vitamin B 12 deficiency    during pregnancy   Past Surgical History:  Procedure Laterality Date   ABDOMINAL HYSTERECTOMY  2011   BOTOX INJECTION N/A 10/22/2022   Procedure: BOTOX INJECTION 100 UNITS;  Surgeon: Noel Christmas, MD;  Location: [facility];  Service:  Urology;  Laterality: N/A;   BREAST LUMPECTOMY WITH RADIOACTIVE SEED LOCALIZATION Right 02/20/2021   Procedure: RIGHT BREAST LUMPECTOMY WITH RADIOACTIVE SEED LOCALIZATION;  Surgeon: Almond Lint, MD;  Location: [facility] OR;  Service: General;  Laterality: Right;   CYSTOSCOPY WITH INJECTION N/A 10/22/2022   Procedure: CYSTOSCOPY WITH INJECTION OF Mariam Dollar;  Surgeon: Noel Christmas, MD;  Location: [facility];  Service: Urology;  Laterality: N/A;   REPLACEMENT TOTAL KNEE Right 2012     ALLERGIES:  Allergies  Allergen Reactions   Amoxicillin Nausea And Vomiting   Percocet [Oxycodone-Acetaminophen] Other (See Comments)    hallucinations     CURRENT MEDICATIONS:  Outpatient Encounter Medications as of 12/19/2022  Medication Sig   acetaminophen (TYLENOL) 650 MG CR tablet Take 650 mg by  mouth every 8 (eight) hours as needed for pain. Takes usually once a day   alendronate (FOSAMAX) 70 MG tablet TAKE 1 TABLET (70 MG TOTAL) BY MOUTH EVERY 7 (SEVEN) DAYS. TAKE WITH A FULL GLASS OF WATER ON AN EMPTY STOMACH.   aspirin 81 MG EC tablet Take 81 mg by mouth daily. Swallow whole.   calcium carbonate (OS-CAL) 600 MG TABS tablet Take 1 tablet (600 mg total) by mouth 2 (two) times daily with a meal. (Patient taking differently: Take 600 mg by mouth as needed.)   chlorthalidone (HYGROTON) 25 MG tablet Take 1 tablet (25 mg total) by mouth daily.   diclofenac Sodium (VOLTAREN) 1 % GEL Apply 2 g topically daily as needed (Osteoarthritis).   Melatonin 10 MG TABS Take 10 mg by mouth at bedtime as needed (sleep).   metFORMIN (GLUCOPHAGE) 500 MG tablet Take 2 tablets (1,000 mg total) by mouth daily with breakfast.   metroNIDAZOLE (METROGEL) 1 % gel Apply topically daily. (Patient taking differently: Apply 1 application  topically daily as needed (rosacea).)   Multiple Vitamin (MULTIVITAMIN) tablet Take 1 tablet by mouth daily.   tamoxifen (NOLVADEX) 20 MG tablet Take 1 tablet (20 mg total) by mouth daily.   No facility-administered encounter medications on file as of 12/19/2022.     ONCOLOGIC FAMILY HISTORY:  Family History  Problem Relation Age of Onset   Heart attack Mother    Heart disease Mother    Hyperlipidemia Mother    Hypertension Mother    Stroke Mother    Miscarriages / [place] Mother    Diabetes Mother    Thyroid disease Mother    Cancer Mother    Obesity Mother    Melanoma Mother        dx after 25, sun-exposed areas   Cancer Father        skin   Alcohol abuse Father    Asthma Father    COPD Father    Heart attack Father    Heart disease Father    Obesity Father    Melanoma Father        dx after 100, back   Alcohol abuse Brother    Arthritis Brother    Drug abuse Brother    Alcohol abuse Brother    Cancer Brother        ? esophageal, d. 33   Breast cancer  Paternal Aunt 58   Arthritis Maternal Grandmother    Diabetes Maternal Grandmother    Hyperlipidemia Maternal Grandmother    Colon cancer Maternal Grandfather        dx 86, dx 3   Alcohol abuse Paternal Grandfather    Arthritis Paternal Grandfather    Cirrhosis Paternal  Grandfather    Depression Daughter    Hypertension Daughter    Mental illness Daughter    Colon polyps Daughter    Depression Son      GENETIC COUNSELING/TESTING: See above  SOCIAL HISTORY:  Social History   Socioeconomic History   Marital status: Widowed    Spouse name: Not on file   Number of children: 3   Years of education: Not on file   Highest education level: Not on file  Occupational History   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    Drug use: Never   Sexual activity: Not on file  Other Topics Concern   Not on file  Social History Narrative   Not on file   Social Determinants of Health   Financial Resource Strain: Low Risk  (10/04/2021)   Overall Financial Resource Strain (CARDIA)    Difficulty of Paying Living Expenses: Not hard at all  Food Insecurity: No Food Insecurity (08/17/2021)   Hunger Vital Sign    Worried About Running Out of Food in the Last Year: Never true    Ran Out of Food in the Last Year: Never true  Transportation Needs: No Transportation Needs (08/17/2021)   PRAPARE - Administrator, Civil Service (Medical): No    Lack of Transportation (Non-Medical): No  Physical Activity: Inactive (08/17/2021)   Exercise Vital Sign    Days of Exercise per Week: 0 days    Minutes of Exercise per  Session: 0 min  Stress: No Stress Concern Present (08/17/2021)   [employer] of Occupational Health - Occupational Stress Questionnaire    Feeling of Stress : Not at all  Social Connections: Moderately Integrated (08/17/2021)   Social Connection and Isolation Panel [NHANES]    Frequency of Communication with Friends and Family: Twice a week    Frequency of Social Gatherings with Friends and Family: Twice a week    Attends Religious Services: More than 4 times per year    Active Member of [employer] or Organizations: Yes    Attends Banker Meetings: More than 4 times per year    Marital Status: Widowed  Intimate Partner Violence: Not At Risk (08/17/2021)   Humiliation, Afraid, Rape, and Kick questionnaire    Fear of Current or Ex-Partner: No    Emotionally Abused: No    Physically Abused: No    Sexually Abused: No     OBSERVATIONS/OBJECTIVE:  BP (!) 158/66 (BP Location: Right Arm, Patient Position: Sitting)   Pulse 89   Temp 97.7 F (36.5 C) (Temporal)   Resp 17   Wt 258 lb 9.6 oz (117.3 kg)   SpO2 92%   BMI 45.81 kg/m   Physical Exam Constitutional:      Appearance: Normal appearance.  Chest:     Comments: Large dense breasts bilaterally.  No palpable masses on exam today. No regional adenopathy Musculoskeletal:     Cervical back: Normal range of motion and neck supple. No rigidity.  Lymphadenopathy:  Cervical: No cervical adenopathy.  Neurological:     Mental Status: She is alert.     LABORATORY DATA:  None for this visit.  DIAGNOSTIC IMAGING:  None for this visit.     ASSESSMENT AND PLAN:  Ms.. Hunter is a pleasant 83 y.o. female with Stage IA right breast invasive ductal carcinoma, ER+/PR+/HER2-, diagnosed in 01/2021, treated with lumpectomy, and anti-estrogen therapy with Tamoxifen beginning in 04/2021.  She denies any changes in breast.  1. Stage IA right breast cancer:  Kristi Hunter is continuing to recover from definitive treatment for breast  cancer.  She has been tolerating tamoxifen very well.  She could not move forward with radiation given severe claustrophobia.  Last imaging from [facility] showed possibly benign calcifications, repeat diagnostic mammogram recommended in 6 months. She however did not make it to the 6 month mammogram. She said she lost track of time and forgot it. She forgot to call us. We will fax a new order to [facility] imaging for repeat mammogram. No concerns on physical exam today.   2. Bone health: Kristi Hunter's most recent bone density test was completed on January 22, 2019.  This test showed a T score of -2.8 in the left femoral neck consistent with osteoporosis.   She is taking Fosamax weekly.  Most recent bone density in June 2023 consistent with osteoporosis, mild improvement.   Repeat due in June 2025.  She will return to clinic in 6 months or sooner as needed.   No new dental concerns.  Total time spent: 30 minutes   -*Total Encounter Time as defined by the Centers for Medicare and Medicaid Services includes, in addition to the face-to-face time of a patient visit (documented in the note above) non-face-to-face time: obtaining and reviewing outside history, ordering and reviewing medications, tests or procedures, care coordination (communications with other health care professionals or caregivers) and documentation in the medical record.

## 2022-12-21 ENCOUNTER — Other Ambulatory Visit (INDEPENDENT_AMBULATORY_CARE_PROVIDER_SITE_OTHER): Payer: Self-pay | Admitting: Family Medicine

## 2022-12-21 DIAGNOSIS — R7303 Prediabetes: Secondary | ICD-10-CM

## 2022-12-25 DIAGNOSIS — F321 Major depressive disorder, single episode, moderate: Secondary | ICD-10-CM | POA: Diagnosis not present

## 2022-12-25 DIAGNOSIS — F4389 Other reactions to severe stress: Secondary | ICD-10-CM | POA: Diagnosis not present

## 2022-12-27 ENCOUNTER — Other Ambulatory Visit: Payer: Self-pay | Admitting: *Deleted

## 2022-12-27 DIAGNOSIS — Z170 Estrogen receptor positive status [ER+]: Secondary | ICD-10-CM

## 2022-12-27 DIAGNOSIS — Z01419 Encounter for gynecological examination (general) (routine) without abnormal findings: Secondary | ICD-10-CM

## 2022-12-30 ENCOUNTER — Telehealth: Payer: Self-pay | Admitting: Hematology and Oncology

## 2022-12-30 NOTE — Telephone Encounter (Signed)
Left patient a vm regarding upcoming appointment  

## 2023-01-02 DIAGNOSIS — M1712 Unilateral primary osteoarthritis, left knee: Secondary | ICD-10-CM | POA: Diagnosis not present

## 2023-01-02 DIAGNOSIS — X58XXXA Exposure to other specified factors, initial encounter: Secondary | ICD-10-CM | POA: Diagnosis not present

## 2023-01-02 DIAGNOSIS — M25862 Other specified joint disorders, left knee: Secondary | ICD-10-CM | POA: Diagnosis not present

## 2023-01-02 DIAGNOSIS — S83282A Other tear of lateral meniscus, current injury, left knee, initial encounter: Secondary | ICD-10-CM | POA: Diagnosis not present

## 2023-01-02 DIAGNOSIS — M25562 Pain in left knee: Secondary | ICD-10-CM | POA: Diagnosis not present

## 2023-01-02 DIAGNOSIS — G8929 Other chronic pain: Secondary | ICD-10-CM | POA: Diagnosis not present

## 2023-01-07 DIAGNOSIS — F4389 Other reactions to severe stress: Secondary | ICD-10-CM | POA: Diagnosis not present

## 2023-01-07 DIAGNOSIS — F321 Major depressive disorder, single episode, moderate: Secondary | ICD-10-CM | POA: Diagnosis not present

## 2023-01-08 ENCOUNTER — Telehealth: Payer: Self-pay | Admitting: Family Medicine

## 2023-01-08 DIAGNOSIS — H04123 Dry eye syndrome of bilateral lacrimal glands: Secondary | ICD-10-CM | POA: Diagnosis not present

## 2023-01-08 DIAGNOSIS — Z961 Presence of intraocular lens: Secondary | ICD-10-CM | POA: Diagnosis not present

## 2023-01-08 DIAGNOSIS — D3131 Benign neoplasm of right choroid: Secondary | ICD-10-CM | POA: Diagnosis not present

## 2023-01-08 DIAGNOSIS — H401131 Primary open-angle glaucoma, bilateral, mild stage: Secondary | ICD-10-CM | POA: Diagnosis not present

## 2023-01-08 NOTE — Telephone Encounter (Signed)
Patient called to see if we have received the report from her MRI. She said that it was done in [facility] at [place] (she thinks?).  Please advise.

## 2023-01-09 DIAGNOSIS — C50411 Malignant neoplasm of upper-outer quadrant of right female breast: Secondary | ICD-10-CM | POA: Diagnosis not present

## 2023-01-09 DIAGNOSIS — R92323 Mammographic fibroglandular density, bilateral breasts: Secondary | ICD-10-CM | POA: Diagnosis not present

## 2023-01-09 DIAGNOSIS — Z170 Estrogen receptor positive status [ER+]: Secondary | ICD-10-CM | POA: Diagnosis not present

## 2023-01-09 DIAGNOSIS — R928 Other abnormal and inconclusive findings on diagnostic imaging of breast: Secondary | ICD-10-CM | POA: Diagnosis not present

## 2023-01-09 NOTE — Telephone Encounter (Signed)
I do not have the results.  Where and when was this MRI done?

## 2023-01-09 NOTE — Telephone Encounter (Signed)
MRI was done 01/02/23 at [facility] - RADIOLOGY MR  [address]  [place], [place] [address]  [phone]

## 2023-01-09 NOTE — Telephone Encounter (Signed)
Called [facility], they will fax a copy of the report to the office.

## 2023-01-13 NOTE — Telephone Encounter (Signed)
Patient is scheduled to see Dr [name] on Friday.

## 2023-01-13 NOTE — Telephone Encounter (Signed)
Attached is the MRI report:  Recommend return to clinic to discuss it in more detail.  Main problem is advanced arthritis likely will require knee replacement.  Procedure Note  Holley Dexter, MD - 01/11/2023 Formatting of this note might be different from the original. CLINICAL DATA:  Chronic left knee pain.  Possible mass.  EXAM: MRI OF THE LEFT KNEE WITHOUT CONTRAST  TECHNIQUE: Multiplanar, multisequence MR imaging of the knee was performed. No intravenous contrast was administered.  COMPARISON:  None Available.  FINDINGS: MENISCI  Medial meniscus: There is a complex tear in the posterior horn. An ossific fragment measuring 0.9 cm transverse by 0.5 cm craniocaudal by 0.8 cm AP is seen just peripheral to the root of the posterior horn.  Lateral meniscus: Complex degenerative tearing is seen in the posterior and anterior horns. The body is markedly degenerated.  LIGAMENTS  Cruciates:  Intact.  Mucoid degeneration of the ACL noted  Collaterals:  Intact.  CARTILAGE  Patellofemoral:  Markedly thinned throughout.  Medial:  Markedly thinned throughout.  Lateral:  Markedly thinned throughout.  Joint:  Trace amount of joint fluid.  Popliteal Fossa:  No Baker's cyst.  Extensor Mechanism:  Intact.  Bones: No fracture, stress change or worrisome lesion. Tricompartmental osteophytosis is present.  Other: No fluid collection or mass is identified. There is some subcutaneous edema about the knee.  IMPRESSION: 1. Dominant finding is advanced tricompartmental osteoarthritis. 2. Complex tear posterior horn medial meniscus with an ossific fragment seen just peripheral to the root of the posterior horn. 3. Complex degenerative tearing posterior and anterior horns of the lateral meniscus. 4. Mucoid degeneration of the ACL.   Electronically Signed   By: Drusilla Kanner M.D.   On: 01/11/2023 07:51 Exam End: 01/02/23 14:56

## 2023-01-14 ENCOUNTER — Ambulatory Visit (INDEPENDENT_AMBULATORY_CARE_PROVIDER_SITE_OTHER): Payer: Medicare HMO | Admitting: Family Medicine

## 2023-01-14 ENCOUNTER — Encounter (INDEPENDENT_AMBULATORY_CARE_PROVIDER_SITE_OTHER): Payer: Self-pay | Admitting: Family Medicine

## 2023-01-14 VITALS — BP 136/81 | HR 95 | Temp 97.5°F | Ht 63.0 in | Wt 260.0 lb

## 2023-01-14 DIAGNOSIS — E669 Obesity, unspecified: Secondary | ICD-10-CM

## 2023-01-14 DIAGNOSIS — Z6841 Body Mass Index (BMI) 40.0 and over, adult: Secondary | ICD-10-CM | POA: Diagnosis not present

## 2023-01-14 DIAGNOSIS — R7303 Prediabetes: Secondary | ICD-10-CM

## 2023-01-14 DIAGNOSIS — R600 Localized edema: Secondary | ICD-10-CM

## 2023-01-14 MED ORDER — FUROSEMIDE 20 MG PO TABS
20.0000 mg | ORAL_TABLET | Freq: Every day | ORAL | 0 refills | Status: DC
Start: 2023-01-14 — End: 2023-03-06

## 2023-01-14 MED ORDER — METFORMIN HCL 500 MG PO TABS: 1000.0000 mg | ORAL_TABLET | Freq: Every day | ORAL | 0 refills | Status: DC

## 2023-01-15 ENCOUNTER — Encounter (INDEPENDENT_AMBULATORY_CARE_PROVIDER_SITE_OTHER): Payer: Self-pay | Admitting: *Deleted

## 2023-01-15 ENCOUNTER — Telehealth (INDEPENDENT_AMBULATORY_CARE_PROVIDER_SITE_OTHER): Payer: Self-pay | Admitting: *Deleted

## 2023-01-15 ENCOUNTER — Ambulatory Visit: Payer: Medicare HMO

## 2023-01-15 DIAGNOSIS — R600 Localized edema: Secondary | ICD-10-CM | POA: Insufficient documentation

## 2023-01-15 NOTE — Telephone Encounter (Signed)
[facility] call [facility] for patient referral per Dr. Dalbert Garnet. They were able to get patient scheduled for Friday 01/17/2023 at 1:30 but patient can not make that. I did provide patient with there address and phone number so she can call and get rescheduled. I did tell her that Dr. Dalbert Garnet wanted her to be seen soon, like this week or next week.

## 2023-01-15 NOTE — Progress Notes (Unsigned)
Chief Complaint:   OBESITY Kristi Hunter is here to discuss her progress with her obesity treatment plan along with follow-up of her obesity related diagnoses. Sole is on practicing portion control and making smarter food choices, such as increasing vegetables and decreasing simple carbohydrates and states she is following her eating plan approximately 20% of the time. Treona states she has been walking more and doing pool exercises.    Today's visit was #: 34 Starting weight: 267 lbs Starting date: 02/22/2020 Today's weight: 260 lbs Today's date: 01/14/2023 Total lbs lost to date: 7 Total lbs lost since last in-office visit: 0  Interim History: Patient notes a jump up and her weight a few days ago and increased lower extremity edema.  Subjective:   1. Bilateral edema of lower extremity Patient has bilateral 2+ pitting edema for 3 to 4 days after traveling, and she has increased shortness of breath and increased orthopnea.   2. Pre-diabetes Patient is working on her diet and weight loss to improve her prediabetes and avoid diabetes mellitus.  She is stable on metformin.  Assessment/Plan:   1. Bilateral edema of lower extremity Patient agreed to start Lasix 20 mg once daily for 5 days, with no refills.  We have referred the patient to Cardiology for evaluation.  - furosemide (LASIX) 20 MG tablet; Take 1 tablet (20 mg total) by mouth daily.  Dispense: 10 tablet; Refill: 0 - Ambulatory referral to Cardiology  2. Pre-diabetes Patient will continue metformin, and we will refill for 1 month.  - metFORMIN (GLUCOPHAGE) 500 MG tablet; Take 2 tablets (1,000 mg total) by mouth daily with breakfast.  Dispense: 60 tablet; Refill: 0  3. BMI 45.0-49.9, adult (HCC)  4. Obesity, Beginning BMI 48.83 Kristi Hunter is currently in the action stage of change. As such, her goal is to continue with weight loss efforts. She has agreed to practicing portion control and making smarter food choices,  such as increasing vegetables and decreasing simple carbohydrates.   Exercise goals: As is.  Behavioral modification strategies: increasing lean protein intake and decreasing sodium intake.  [name] has agreed to follow-up with our clinic in 4 weeks. She was informed of the importance of frequent follow-up visits to maximize her success with intensive lifestyle modifications for her multiple health conditions.   Objective:   Blood pressure 136/81, pulse 95, temperature (!) 97.5 F (36.4 C), height 5\' 3"  (1.6 m), weight 260 lb (117.9 kg), SpO2 94 %. Body mass index is 46.06 kg/m.  Lab Results  Component Value Date   CREATININE 0.70 10/22/2022   BUN 20 10/22/2022   NA 138 10/22/2022   K 3.9 10/22/2022   CL 98 10/22/2022   CO2 31 (A) 08/23/2022   Lab Results  Component Value Date   ALT 15 08/23/2022   AST 15 08/23/2022   ALKPHOS 43 03/15/2022   BILITOT 0.3 03/15/2022   Lab Results  Component Value Date   HGBA1C 5.8 08/23/2022   HGBA1C 5.9 (H) 02/20/2022   HGBA1C 5.8 (H) 12/25/2021   HGBA1C 5.7 (H) 04/12/2021   HGBA1C 5.8 (H) 02/19/2021   Lab Results  Component Value Date   INSULIN 22.5 02/20/2022   INSULIN 37.9 (H) 04/12/2021   INSULIN 21.5 02/22/2020   Lab Results  Component Value Date   TSH 3.10 08/23/2022   Lab Results  Component Value Date   CHOL 161 08/23/2022   HDL 64 08/23/2022   LDLCALC 84 08/23/2022   TRIG 58 08/23/2022   CHOLHDL 3  01/13/2019   Lab Results  Component Value Date   VD25OH 32.7 02/20/2022   VD25OH 35.1 12/25/2021   VD25OH 46.8 04/12/2021   Lab Results  Component Value Date   WBC 9.2 08/23/2022   HGB 12.9 10/22/2022   HCT 38.0 10/22/2022   MCV 87 02/20/2022   PLT 350 08/23/2022   No results found for: "IRON", "TIBC", "FERRITIN"  Attestation Statements:   Reviewed by clinician on day of visit: allergies, medications, problem list, medical history, surgical history, family history, social history, and previous encounter  notes.   I, Burt Knack, am acting as transcriptionist for Quillian Quince, MD.  I have reviewed the above documentation for accuracy and completeness, and I agree with the above. -  Quillian Quince, MD

## 2023-01-16 ENCOUNTER — Inpatient Hospital Stay: Payer: Medicare HMO | Attending: Hematology and Oncology | Admitting: Hematology and Oncology

## 2023-01-16 DIAGNOSIS — C50411 Malignant neoplasm of upper-outer quadrant of right female breast: Secondary | ICD-10-CM | POA: Diagnosis not present

## 2023-01-16 DIAGNOSIS — Z90710 Acquired absence of both cervix and uterus: Secondary | ICD-10-CM | POA: Diagnosis not present

## 2023-01-16 DIAGNOSIS — Z800 Family history of malignant neoplasm of digestive organs: Secondary | ICD-10-CM

## 2023-01-16 DIAGNOSIS — Z87891 Personal history of nicotine dependence: Secondary | ICD-10-CM

## 2023-01-16 DIAGNOSIS — Z803 Family history of malignant neoplasm of breast: Secondary | ICD-10-CM | POA: Diagnosis not present

## 2023-01-16 DIAGNOSIS — Z808 Family history of malignant neoplasm of other organs or systems: Secondary | ICD-10-CM | POA: Diagnosis not present

## 2023-01-16 DIAGNOSIS — Z170 Estrogen receptor positive status [ER+]: Secondary | ICD-10-CM | POA: Diagnosis not present

## 2023-01-16 NOTE — Progress Notes (Signed)
I, Stevenson Clinch, CMA acting as a scribe for Clementeen Graham, MD.  Kristi Hunter is a 82 y.o. female who presents to [facility] today for follow-up knee pain and knee MRI results.  She was seen previously December 2023 for steroid injection and subsequently March 2024 with continued knee pain.  She was concerned about a mass at the posterior aspect the knee and ultimately did have an MRI.  MRI was challenging due to significant claustrophobia.  She is here today to discuss her MRI results and perhaps proceed with an injection. She notes worsening knee pain. Gets some relief with injections. Pain at posterior aspect of the knee continues to be bothersome, causing mobility issues and shoulder pain. Has switched from using heat to using ice. Feels that the area at the back of the knee has grown in size. Sx seem to be progressively worsening. Has been unable to exercise d/t pain and recent dx of bronchitis.   She notes that she has typical anterior to the mid knee pain which is improved with steroid injection and still feeling okay and posterior knee pain discomfort and lack of range of motion which has not improved with physical therapy and injections.  Dx imaging: L knee MRI  07/26/22 L knee XR  Pertinent review of systems: No fevers or chills  Relevant historical information: Breast cancer and obesity.   Exam:  BP 124/82   Pulse 83   Ht 5\' 3"  (1.6 m)   Wt 261 lb 12.8 oz (118.8 kg)   SpO2 95%   BMI 46.38 kg/m  General: Well Developed, well nourished, and in no acute distress.   MSK: Left knee mild effusion decreased motion.  Lacks full flexion.    Lab and Radiology Results    Formatting of this note might be different from the original. CLINICAL DATA:  Chronic left knee pain.  Possible mass.  EXAM: MRI OF THE LEFT KNEE WITHOUT CONTRAST  TECHNIQUE: Multiplanar, multisequence MR imaging of the knee was performed. No intravenous contrast was  administered.  COMPARISON:  None Available.  FINDINGS: MENISCI  Medial meniscus: There is a complex tear in the posterior horn. An ossific fragment measuring 0.9 cm transverse by 0.5 cm craniocaudal by 0.8 cm AP is seen just peripheral to the root of the posterior horn.  Lateral meniscus: Complex degenerative tearing is seen in the posterior and anterior horns. The body is markedly degenerated.  LIGAMENTS  Cruciates:  Intact.  Mucoid degeneration of the ACL noted  Collaterals:  Intact.  CARTILAGE  Patellofemoral:  Markedly thinned throughout.  Medial:  Markedly thinned throughout.  Lateral:  Markedly thinned throughout.  Joint:  Trace amount of joint fluid.  Popliteal Fossa:  No Baker's cyst.  Extensor Mechanism:  Intact.  Bones: No fracture, stress change or worrisome lesion. Tricompartmental osteophytosis is present.  Other: No fluid collection or mass is identified. There is some subcutaneous edema about the knee.  IMPRESSION: 1. Dominant finding is advanced tricompartmental osteoarthritis. 2. Complex tear posterior horn medial meniscus with an ossific fragment seen just peripheral to the root of the posterior horn. 3. Complex degenerative tearing posterior and anterior horns of the lateral meniscus. 4. Mucoid degeneration of the ACL.   Electronically Signed   By: Drusilla Kanner M.D.   On: 01/11/2023 07:51 Exam End: 01/02/23 14:56   Specimen Collected: 01/11/23 07:46        Assessment and Plan: 82 y.o. female with left knee pain predominantly due to  the significant degenerative change/arthritis seen on knee MRI.  She does have some degenerative meniscus tears but the advanced tricompartmental arthritis is the main source of pain. Last steroid injection was December 2023.  She has had steroid injections which she thinks did not help the symptoms the most bothersome to her the posterior knee discomfort pain and lack of motion.  Ultimately the most  helpful option would probably be a knee replacement.  However her BMI is 46 and she cannot have a knee replacement.  I do not think that a left third knee surgery such as meniscus debridement is going to help.  I do not think another cortisone shots can help much.  Refer to interventional radiology for evaluation and possible geniculate artery embolization for chronic knee pain.  This will not help her range of motion but might help her pain.  PDMP not reviewed this encounter. Orders Placed This Encounter  Procedures   DG Radiologist Eval And Mgmt    Standing Status:   Future    Standing Expiration Date:   01/17/2024    Scheduling Instructions:     Consultation for possible L knee Genicular Artery Embolization    Order Specific Question:   Reason for Exam (SYMPTOM  OR DIAGNOSIS REQUIRED)    Answer:   Left knee pain    Order Specific Question:   Preferred imaging location?    Answer:   [id number]   No orders of the defined types were placed in this encounter.    Discussed warning signs or symptoms. Please see discharge instructions. Patient expresses understanding.   The above documentation has been reviewed and is accurate and complete Clementeen Graham, M.D.  Total encounter time 30 minutes including face-to-face time with the patient and, reviewing past medical record, and charting on the date of service.   Reviewed MRI results and treatment options.

## 2023-01-16 NOTE — Progress Notes (Signed)
BRIEF ONCOLOGIC HISTORY:  Oncology History  Malignant neoplasm of upper-outer quadrant of right breast in female, estrogen receptor positive (HCC)  02/01/2021 Imaging   EXAM: ULTRASOUND OF THE RIGHT BREAST  FINDINGS: Targeted ultrasound is performed, showing a 10 x 7 x 8 mm irregular hypoechoic mass right breast 10 o'clock position 1 cm from the nipple. No right axillary adenopathy.   IMPRESSION: Suspicious right breast mass 10 o'clock position.   02/05/2021 Initial Biopsy   Diagnosis Breast, right, needle core biopsy, 10 o'clock, 1cmfn - INVASIVE DUCTAL CARCINOMA WITH CALCIFICATIONS. SEE NOTE - DUCTAL CARCINOMA IN SITU, INTERMEDIATE GRADE Diagnosis Note Carcinoma measures 0.7 cm in greatest linear dimension and appears grade 2.  PROGNOSTIC INDICATORS Results: The tumor cells are EQUIVOCAL for Her2 (2+). Her2 by FISH will be performed and results reported separately. Estrogen Receptor: 100%, POSITIVE, STRONG STAINING INTENSITY Progesterone Receptor: 90%, POSITIVE, STRONG STAINING INTENSITY Proliferation Marker Ki67: 20%  FLUORESCENCE IN-SITU HYBRIDIZATION Results: GROUP 5: HER2 **NEGATIVE**   02/08/2021 Initial Diagnosis   Malignant neoplasm of upper-outer quadrant of right breast in female, estrogen receptor positive (HCC)   02/14/2021 Cancer Staging   Staging form: Breast, AJCC 8th Edition - Clinical stage from 02/14/2021: Stage IA (cT1b, cN0, cM0, G2, ER+, PR+, HER2-) - Signed by Lowella Dell, MD on 02/14/2021 Stage prefix: Initial diagnosis Histologic grading system: 3 grade system   02/20/2021 Cancer Staging   Staging form: Breast, AJCC 8th Edition - Pathologic stage from 02/20/2021: Stage IA (pT1c, pN0, cM0, G2, ER+, PR+, HER2-) - Signed by Loa Socks, NP on 08/07/2021 Histologic grading system: 3 grade system   02/20/2021 Definitive Surgery   FINAL MICROSCOPIC DIAGNOSIS:   A. BREAST, RIGHT, LUMPECTOMY:  - Invasive and in situ ductal carcinoma, 1.2  cm.  - Invasive carcinoma focally 0.1 cm from medial margin and 0.2 cm from anterior margin.  - Biopsy site and biopsy clip.  - See oncology table.    02/20/2021 Oncotype testing   Oncotype DX was obtained on the final surgical sample and the recurrence score of 8 predicts a risk of recurrence outside the breast over the next 9 years of 3%, if the patient's only systemic therapy is an antiestrogen for 5 years.  It also predicts no benefit from chemotherapy.    02/23/2021 Genetic Testing   Negative hereditary cancer genetic testing: no pathogenic variants detected in [facility] CustomNext-Cancer +RNAinsight Panel.  The report date is February 23, 2021.    The CustomNext-Cancer +RNAinsight Panel offered by [facility] and includes sequencing and rearrangement analysis for the following 91 genes: AIP, ALK, APC, ATM, AXIN2, BAP1, BARD1, BLM, BMPR1A, BRCA1, BRCA2, BRIP1, CDC73, CDH1, CDK4, CDKN1B, CDKN2A, CHEK2, CTNNA1, DICER1, FANCC, FH, FLCN, GALNT12, KIF1B, LZTR1, MAX, MEN1, MET, MLH1, MRE11A, MSH2, MSH3, MSH6, MUTYH, NBN, NF1, NF2, NTHL1, PALB2, PHOX2B, PMS2, POT1, PRKAR1A, PTCH1, PTEN, RAD50, RAD51C, RAD51D, RB1, RECQL, RET, SDHA, SDHAF2, SDHB, SDHC, SDHD, SMAD4, SMARCA4, SMARCB1, SMARCE1, STK11, SUFU, TMEM127, TP53, TSC1, TSC2, VHL and XRCC2 (sequencing and deletion/duplication); CASR, CFTR, CPA1, CTRC, EGFR, EGLN1, FAM175A, HOXB13, KIT, MITF, MLH3, PALLD, PDGFRA, POLD1, POLE, PRSS1, RINT1, RPS20, SPINK1 and TERT (sequencing only); EPCAM and GREM1 (deletion/duplication only). RNA data is routinely analyzed for use in variant interpretation for all genes.   05/02/2021 - 04/2026 Anti-estrogen oral therapy   tamoxifen started 05/02/2021             (A) s/p remote hysterectomy (for uterine cancer)             (  B) osteoporosis with bone density 2020 T score -2.6     INTERVAL HISTORY:   Ms. Flinders is here for follow-up.   She finally had her mammogram on May 30.  She says everything looked good.  She is  otherwise continuing to deal with the leg pain from the bad knee, no new complaints.   ONCOLOGY TREATMENT TEAM:  1. Surgeon:  Dr. Donell Beers at [facility] 2. Medical Oncologist: Dr. Al Pimple  3. Radiation Oncologist: Dr. Mitzi Hansen    PAST MEDICAL/SURGICAL HISTORY:  Past Medical History:  Diagnosis Date   Anemia    Anxiety    Arthritis    several different parts of the body per pt 10/17/2022   Back pain    Breast cancer (HCC)    02/20/21   Cancer (HCC)    uterine   Chest pain    Chicken pox    Chronic kidney disease    Constipation    Emphysema of lung (HCC)    Family history of breast cancer 02/14/2021   Glaucoma    Hypertension    Joint pain    Lower extremity edema    Lymphedema    Osteoarthritis    Personal history of malignant neoplasm of uterus 02/14/2021   Pre-diabetes    Prediabetes    Shortness of breath    Sleep apnea    doesn't use CPAP machine anymore   Urine incontinence    Uterine cancer (HCC)    2011   UTI (urinary tract infection)    Vertigo    had vertigo last week. 10/17/2022   Vitamin B 12 deficiency    during pregnancy   Past Surgical History:  Procedure Laterality Date   ABDOMINAL HYSTERECTOMY  2011   BOTOX INJECTION N/A 10/22/2022   Procedure: BOTOX INJECTION 100 UNITS;  Surgeon: Noel Christmas, MD;  Location: [facility];  Service: Urology;  Laterality: N/A;   BREAST LUMPECTOMY WITH RADIOACTIVE SEED LOCALIZATION Right 02/20/2021   Procedure: RIGHT BREAST LUMPECTOMY WITH RADIOACTIVE SEED LOCALIZATION;  Surgeon: Almond Lint, MD;  Location: [facility];  Service: General;  Laterality: Right;   CYSTOSCOPY WITH INJECTION N/A 10/22/2022   Procedure: CYSTOSCOPY WITH INJECTION OF Mariam Dollar;  Surgeon: Noel Christmas, MD;  Location: [facility];  Service: Urology;  Laterality: N/A;   REPLACEMENT TOTAL KNEE Right 2012     ALLERGIES:  Allergies  Allergen Reactions   Amoxicillin Nausea And Vomiting   Percocet  [Oxycodone-Acetaminophen] Other (See Comments)    hallucinations     CURRENT MEDICATIONS:  Outpatient Encounter Medications as of 01/16/2023  Medication Sig   acetaminophen (TYLENOL) 650 MG CR tablet Take 650 mg by mouth every 8 (eight) hours as needed for pain. Takes usually once a day   alendronate (FOSAMAX) 70 MG tablet TAKE 1 TABLET (70 MG TOTAL) BY MOUTH EVERY 7 (SEVEN) DAYS. TAKE WITH A FULL GLASS OF WATER ON AN EMPTY STOMACH.   aspirin 81 MG EC tablet Take 81 mg by mouth daily. Swallow whole.   calcium carbonate (OS-CAL) 600 MG TABS tablet Take 1 tablet (600 mg total) by mouth 2 (two) times daily with a meal. (Patient taking differently: Take 600 mg by mouth as needed.)   chlorthalidone (HYGROTON) 25 MG tablet Take 1 tablet (25 mg total) by mouth daily.   diclofenac Sodium (VOLTAREN) 1 % GEL Apply 2 g topically daily as needed (Osteoarthritis).   furosemide (LASIX) 20 MG tablet Take 1 tablet (20 mg total) by mouth  daily.   Melatonin 10 MG TABS Take 10 mg by mouth at bedtime as needed (sleep).   metFORMIN (GLUCOPHAGE) 500 MG tablet Take 2 tablets (1,000 mg total) by mouth daily with breakfast.   metroNIDAZOLE (METROGEL) 1 % gel Apply topically daily. (Patient taking differently: Apply 1 application  topically daily as needed (rosacea).)   Multiple Vitamin (MULTIVITAMIN) tablet Take 1 tablet by mouth daily.   tamoxifen (NOLVADEX) 20 MG tablet Take 1 tablet (20 mg total) by mouth daily.   No facility-administered encounter medications on file as of 01/16/2023.     ONCOLOGIC FAMILY HISTORY:  Family History  Problem Relation Age of Onset   Heart attack Mother    Heart disease Mother    Hyperlipidemia Mother    Hypertension Mother    Stroke Mother    Miscarriages / [place] Mother    Diabetes Mother    Thyroid disease Mother    Cancer Mother    Obesity Mother    Melanoma Mother        dx after 21, sun-exposed areas   Cancer Father        skin   Alcohol abuse Father     Asthma Father    COPD Father    Heart attack Father    Heart disease Father    Obesity Father    Melanoma Father        dx after 62, back   Alcohol abuse Brother    Arthritis Brother    Drug abuse Brother    Alcohol abuse Brother    Cancer Brother        ? esophageal, d. 45   Breast cancer Paternal Aunt 48   Arthritis Maternal Grandmother    Diabetes Maternal Grandmother    Hyperlipidemia Maternal Grandmother    Colon cancer Maternal Grandfather        dx 30, dx 52   Alcohol abuse Paternal Grandfather    Arthritis Paternal Grandfather    Cirrhosis Paternal Grandfather    Depression Daughter    Hypertension Daughter    Mental illness Daughter    Colon polyps Daughter    Depression Son      GENETIC COUNSELING/TESTING: See above  SOCIAL HISTORY:  Social History   Socioeconomic History   Marital status: Widowed    Spouse name: Not on file   Number of children: 3   Years of education: Not on file   Highest education level: Not on file  Occupational History   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    Drug use: Never   Sexual activity: Not on file  Other Topics Concern   Not on file  Social History Narrative   Not on file   Social Determinants of Health   Financial Resource Strain: Low Risk  (10/04/2021)   Overall Financial Resource Strain (CARDIA)    Difficulty of Paying Living Expenses: Not hard at all  Food Insecurity: No Food Insecurity (08/17/2021)   Hunger Vital Sign    Worried  About Running Out of Food in the Last Year: Never true    Ran Out of Food in the Last Year: Never true   Transportation Needs: No Transportation Needs (08/17/2021)   PRAPARE - Administrator, Civil Service (Medical): No    Lack of Transportation (Non-Medical): No  Physical Activity: Inactive (08/17/2021)   Exercise Vital Sign    Days of Exercise per Week: 0 days    Minutes of Exercise per Session: 0 min  Stress: No Stress Concern Present (08/17/2021)   [employer] of Occupational Health - Occupational Stress Questionnaire    Feeling of Stress : Not at all  Social Connections: Moderately Integrated (08/17/2021)   Social Connection and Isolation Panel [NHANES]    Frequency of Communication with Friends and Family: Twice a week    Frequency of Social Gatherings with Friends and Family: Twice a week    Attends Religious Services: More than 4 times per year    Active Member of [employer] or Organizations: Yes    Attends Banker Meetings: More than 4 times per year    Marital Status: Widowed  Intimate Partner Violence: Not At Risk (08/17/2021)   Humiliation, Afraid, Rape, and Kick questionnaire    Fear of Current or Ex-Partner: No    Emotionally Abused: No    Physically Abused: No    Sexually Abused: No     OBSERVATIONS/OBJECTIVE:  There were no vitals taken for this visit.  Physical exam not done, telephone visit  LABORATORY DATA:  None for this visit.  DIAGNOSTIC IMAGING:  None for this visit.     ASSESSMENT AND PLAN:   Ms.. Kristi Hunter is a pleasant 82 y.o. female with Stage IA right breast invasive ductal carcinoma, ER+/PR+/HER2-, diagnosed in 01/2021, treated with lumpectomy, and anti-estrogen therapy with Tamoxifen beginning in 04/2021.  She could not move forward with radiation given severe claustrophobia.  [place] with no concerning changes.  Annual mammogram due in May 2025. She will return to clinic for follow-up in November.  She is otherwise very compliant with tamoxifen. She will return to clinic as scheduled No  new dental concerns.  Total time spent: 5 minutes   -*Total Encounter Time as defined by the Centers for Medicare and Medicaid Services includes, in addition to the face-to-face time of a patient visit (documented in the note above) non-face-to-face time: obtaining and reviewing outside history, ordering and reviewing medications, tests or procedures, care coordination (communications with other health care professionals or caregivers) and documentation in the medical record.

## 2023-01-17 ENCOUNTER — Ambulatory Visit: Payer: Medicare HMO | Admitting: Family Medicine

## 2023-01-17 ENCOUNTER — Telehealth: Payer: Medicare HMO | Admitting: Hematology and Oncology

## 2023-01-17 ENCOUNTER — Encounter: Payer: Self-pay | Admitting: Family Medicine

## 2023-01-17 ENCOUNTER — Ambulatory Visit: Payer: Medicare HMO | Admitting: Internal Medicine

## 2023-01-17 VITALS — BP 124/82 | HR 83 | Ht 63.0 in | Wt 261.8 lb

## 2023-01-17 DIAGNOSIS — M25862 Other specified joint disorders, left knee: Secondary | ICD-10-CM | POA: Diagnosis not present

## 2023-01-17 DIAGNOSIS — G8929 Other chronic pain: Secondary | ICD-10-CM

## 2023-01-17 DIAGNOSIS — M25562 Pain in left knee: Secondary | ICD-10-CM

## 2023-01-17 NOTE — Patient Instructions (Addendum)
Thank you for coming in today.    

## 2023-01-20 ENCOUNTER — Ambulatory Visit: Payer: Medicare HMO | Admitting: Internal Medicine

## 2023-01-20 NOTE — Progress Notes (Deleted)
Cardiology Office Note:    Date:  01/20/2023   ID:  Kristi Hunter, DOB [birth year], MRN [id number]  PCP:  Nadara Eaton, MD   [facility] Providers Cardiologist:  None { Click to update primary MD,subspecialty MD or APP then REFRESH:1}    Referring MD: Wilder Glade, MD   No chief complaint on file. ***  History of Present Illness:    Kristi Hunter is a 82 y.o. female with a hx of HTN, OSA, uterine CA, breast CA, referral for BL LE edema. Her BMI is 46.   Past Medical History:  Diagnosis Date   Anemia    Anxiety    Arthritis    several different parts of the body per pt 10/17/2022   Back pain    Breast cancer (HCC)    02/20/21   Cancer (HCC)    uterine   Chest pain    Chicken pox    Chronic kidney disease    Constipation    Emphysema of lung (HCC)    Family history of breast cancer 02/14/2021   Glaucoma    Hypertension    Joint pain    Lower extremity edema    Lymphedema    Osteoarthritis    Personal history of malignant neoplasm of uterus 02/14/2021   Pre-diabetes    Prediabetes    Shortness of breath    Sleep apnea    doesn't use CPAP machine anymore   Urine incontinence    Uterine cancer (HCC)    2011   UTI (urinary tract infection)    Vertigo    had vertigo last week. 10/17/2022   Vitamin B 12 deficiency    during pregnancy    Past Surgical History:  Procedure Laterality Date   ABDOMINAL HYSTERECTOMY  2011   BOTOX INJECTION N/A 10/22/2022   Procedure: BOTOX INJECTION 100 UNITS;  Surgeon: Noel Christmas, MD;  Location: [facility];  Service: Urology;  Laterality: N/A;   BREAST LUMPECTOMY WITH RADIOACTIVE SEED LOCALIZATION Right 02/20/2021   Procedure: RIGHT BREAST LUMPECTOMY WITH RADIOACTIVE SEED LOCALIZATION;  Surgeon: Almond Lint, MD;  Location: [facility];  Service: General;  Laterality: Right;   CYSTOSCOPY WITH INJECTION N/A 10/22/2022   Procedure: CYSTOSCOPY WITH INJECTION OF Mariam Dollar;  Surgeon: Noel Christmas, MD;  Location: [facility];  Service: Urology;  Laterality: N/A;   REPLACEMENT TOTAL KNEE Right 2012    Current Medications: No outpatient medications have been marked as taking for the 01/20/23 encounter (Appointment) with Maisie Fus, MD.     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    Drug use: Never   Sexual activity: Not on file  Other Topics Concern   Not on file  Social History Narrative   Not on file   Social Determinants of Health   Financial Resource Strain: Low Risk  (10/04/2021)   Overall Financial Resource Strain (CARDIA)    Difficulty of Paying Living Expenses: Not hard at all  Food Insecurity: No Food Insecurity (08/17/2021)   Hunger Vital Sign    Worried About Running Out of Food in the Last Year: Never true    Ran Out of Food in the Last Year: Never true  Transportation Needs: No Transportation Needs (08/17/2021)   PRAPARE - Administrator, Civil Service (Medical): No    Lack of Transportation (Non-Medical): No  Physical Activity: Inactive (08/17/2021)   Exercise Vital Sign    Days of Exercise per Week: 0 days    Minutes of Exercise per Session: 0 min  Stress: No Stress Concern Present (08/17/2021)   [employer] of Occupational Health - Occupational Stress  Questionnaire    Feeling of Stress : Not at all  Social Connections: Moderately Integrated (08/17/2021)   Social Connection and Isolation Panel [NHANES]    Frequency of Communication with Friends and Family: Twice a week    Frequency of Social Gatherings with Friends and Family: Twice a week    Attends Religious Services: More than 4 times per year    Active Member of [employer] or Organizations: Yes    Attends Banker Meetings: More than 4 times per year    Marital Status: Widowed     Family History: The patient's ***family history includes Alcohol abuse in her brother, brother, father, and paternal grandfather; Arthritis in her brother, maternal grandmother, and paternal grandfather; Asthma in her father; Breast cancer (age of onset: 85) in her paternal aunt; COPD in her father; Cancer in her brother, father, and mother; Cirrhosis in her paternal grandfather; Colon cancer in her maternal grandfather; Colon polyps in her daughter; Depression in her daughter and son; Diabetes in her maternal grandmother and mother; Drug abuse in her brother; Heart attack in her father and mother; Heart disease in her father and mother; Hyperlipidemia in her maternal grandmother and mother; Hypertension in her daughter and mother; Melanoma in her father and mother; Mental illness in her daughter; Miscarriages / Stillbirths in her mother; Obesity in her father and mother; Stroke in her mother; Thyroid disease in her mother.  ROS:   Please see the history of present illness.    *** All other systems reviewed and are negative.  EKGs/Labs/Other Studies Reviewed:    The following studies were reviewed today: ***  EKG:  EKG is *** ordered today.  The ekg ordered today demonstrates ***  Recent Labs: 08/23/2022: ALT 15; Platelets 350; TSH 3.10 10/22/2022: BUN 20; Creatinine, Ser 0.70; Hemoglobin 12.9; Potassium 3.9; Sodium 138  Recent Lipid Panel    Component Value Date/Time   CHOL 161 08/23/2022 0000    CHOL 159 02/20/2022 0942   TRIG 58 08/23/2022 0000   HDL 64 08/23/2022 0000   HDL 49 02/20/2022 0942   CHOLHDL 3 01/13/2019 0913   VLDL 14.4 01/13/2019 0913   LDLCALC 84 08/23/2022 0000   LDLCALC 98 02/20/2022 0942     Risk Assessment/Calculations:   {Does this patient have ATRIAL FIBRILLATION?:[id number]}  No BP recorded.  {Refresh Note OR Click here to enter BP  :1}***         Physical Exam:    VS:  There were no vitals taken for this visit.  Wt Readings from Last 3 Encounters:  01/17/23 261 lb 12.8 oz (118.8 kg)  01/14/23 260 lb (117.9 kg)  12/19/22 258 lb 9.6 oz (117.3 kg)     GEN: *** Well nourished, well developed in no acute distress HEENT: Normal NECK: No JVD; No carotid bruits LYMPHATICS: No lymphadenopathy CARDIAC: ***RRR, no murmurs, rubs, gallops RESPIRATORY:  Clear to auscultation without rales, wheezing or rhonchi  ABDOMEN: Soft, non-tender, non-distended MUSCULOSKELETAL:  No edema; No deformity  SKIN: Warm and dry NEUROLOGIC:  Alert and oriented x 3 PSYCHIATRIC:  Normal affect   ASSESSMENT:    LE edema: clinically no signs of CHF. Will get a BNP today because exam can be challenging with habitus. Otherwise recommend compression stockings. PLAN:    In order of problems listed above:  ***      {Are you ordering a CV Procedure (e.g. stress test, cath, DCCV, TEE, etc)?   Press F2        :[id number]}    Medication Adjustments/Labs and Tests Ordered: Current medicines are reviewed at length with the patient today.  Concerns regarding medicines are outlined above.  No orders of the defined types were placed in this encounter.  No orders of the defined types were placed in this encounter.   There are no Patient Instructions on file for this visit.   Signed, Maisie Fus, MD  01/20/2023 8:00 AM    [facility]

## 2023-01-28 DIAGNOSIS — F4389 Other reactions to severe stress: Secondary | ICD-10-CM | POA: Diagnosis not present

## 2023-01-28 DIAGNOSIS — F321 Major depressive disorder, single episode, moderate: Secondary | ICD-10-CM | POA: Diagnosis not present

## 2023-01-31 NOTE — Progress Notes (Signed)
Chief Complaint: Patient was seen in consultation today for left knee pain  Referring Physician(s): Corey,Evan S  History of Present Illness: Kristi Hunter is a 82 y.o. female with a medical history significant for HTN, CKD, anxiety, uterine cancer, breast cancer s/p lumpectomy and adjuvant radiotherapy (2022) and right total knee replacement.  She also has a history of left knee pain and has been followed by Dr. [name] since 2021. She has received physical therapy and numerous steroid injections as well as the Orthovisc series in attempts to alleviate her knee pain. Steroid injections have provides temporary relief and her last injection was December 2023. Earlier this year she developed worsening posterior knee pain and swelling. MR knee 01/02/23 showed a dominant finding of advanced tricompartmental osteoarthritis which is the likely culprit of her knee pain.   She is not an ideal candidate for a knee replacement due to her BMI of 46.  She has been referred to our services for evaluation of geniculate artery embolization.   [place] Pain Score: VAS Pain Score:  Past Medical History:  Diagnosis Date   Anemia    Anxiety    Arthritis    several different parts of the body per pt 10/17/2022   Back pain    Breast cancer (HCC)    02/20/21   Cancer (HCC)    uterine   Chest pain    Chicken pox    Chronic kidney disease    Constipation    Emphysema of lung (HCC)    Family history of breast cancer 02/14/2021   Glaucoma    Hypertension    Joint pain    Lower extremity edema    Lymphedema    Osteoarthritis    Personal history of malignant neoplasm of uterus 02/14/2021   Pre-diabetes    Prediabetes    Shortness of breath    Sleep apnea    doesn't use CPAP machine anymore   Urine incontinence    Uterine cancer (HCC)    2011   UTI (urinary tract infection)    Vertigo    had vertigo last week. 10/17/2022   Vitamin B 12 deficiency    during pregnancy    Past Surgical  History:  Procedure Laterality Date   ABDOMINAL HYSTERECTOMY  2011   BOTOX INJECTION N/A 10/22/2022   Procedure: BOTOX INJECTION 100 UNITS;  Surgeon: Noel Christmas, MD;  [facility]  Service: Urology;  Laterality: N/A;   BREAST LUMPECTOMY WITH RADIOACTIVE SEED LOCALIZATION Right 02/20/2021   Procedure: RIGHT BREAST LUMPECTOMY WITH RADIOACTIVE SEED LOCALIZATION;  Surgeon: Almond Lint, MD;  Location[facility];  Service: General;  Laterality: Right;   CYSTOSCOPY WITH INJECTION N/A 10/22/2022   Procedure: CYSTOSCOPY WITH INJECTION OF Mariam Dollar;  Surgeon: Noel Christmas, MD;  Location: [facility];  Service: Urology;  Laterality: N/A;   REPLACEMENT TOTAL KNEE Right 2012    Allergies: Amoxicillin and Percocet [oxycodone-acetaminophen]  Medications: Prior to Admission medications   Medication Sig Start Date End Date Taking? Authorizing Provider  acetaminophen (TYLENOL) 650 MG CR tablet Take 650 mg by mouth every 8 (eight) hours as needed for pain. Takes usually once a day    [provider]  alendronate (FOSAMAX) 70 MG tablet TAKE 1 TABLET (70 MG TOTAL) BY MOUTH EVERY 7 (SEVEN) DAYS. TAKE WITH A FULL GLASS OF WATER ON AN EMPTY STOMACH. 04/04/22   Mliss Sax, MD  aspirin 81 MG EC tablet Take 81 mg by mouth daily.  Swallow whole.    [provider]  calcium carbonate (OS-CAL) 600 MG TABS tablet Take 1 tablet (600 mg total) by mouth 2 (two) times daily with a meal. Patient taking differently: Take 600 mg by mouth as needed. 05/10/21   Mliss Sax, MD  chlorthalidone (HYGROTON) 25 MG tablet Take 1 tablet (25 mg total) by mouth daily. 03/15/22   Mliss Sax, MD  diclofenac Sodium (VOLTAREN) 1 % GEL Apply 2 g topically daily as needed (Osteoarthritis).    [provider]  furosemide (LASIX) 20 MG tablet Take 1 tablet (20 mg total) by mouth daily. 01/14/23   Quillian Quince D, MD  Melatonin 10 MG TABS Take 10  mg by mouth at bedtime as needed (sleep).    [provider]  metFORMIN (GLUCOPHAGE) 500 MG tablet Take 2 tablets (1,000 mg total) by mouth daily with breakfast. 01/14/23   Quillian Quince D, MD  metroNIDAZOLE (METROGEL) 1 % gel Apply topically daily. Patient taking differently: Apply 1 application  topically daily as needed (rosacea). 01/29/21   Mliss Sax, MD  Multiple Vitamin (MULTIVITAMIN) tablet Take 1 tablet by mouth daily.    [provider]  tamoxifen (NOLVADEX) 20 MG tablet Take 1 tablet (20 mg total) by mouth daily. 05/27/22   [name] Moulds, MD     Family History  Problem Relation Age of Onset   Heart attack Mother    Heart disease Mother    Hyperlipidemia Mother    Hypertension Mother    Stroke Mother    Miscarriages / [place] Mother    Diabetes Mother    Thyroid disease Mother    Cancer Mother    Obesity Mother    Melanoma Mother        dx after 62, sun-exposed areas   Cancer Father        skin   Alcohol abuse Father    Asthma Father    COPD Father    Heart attack Father    Heart disease Father    Obesity Father    Melanoma Father        dx after 64, back   Alcohol abuse Brother    Arthritis Brother    Drug abuse Brother    Alcohol abuse Brother    Cancer Brother        ? esophageal, d. 85   Breast cancer Paternal Aunt 53   Arthritis Maternal Grandmother    Diabetes Maternal Grandmother    Hyperlipidemia Maternal Grandmother    Colon cancer Maternal Grandfather        dx 13, dx 51   Alcohol abuse Paternal Grandfather    Arthritis Paternal Grandfather    Cirrhosis Paternal Grandfather    Depression Daughter    Hypertension Daughter    Mental illness Daughter    Colon polyps Daughter    Depression Son     Social History   Socioeconomic History   Marital status: Widowed    Spouse name: Not on file   Number of children: 3   Years of education: Not on file   Highest education level: Not on file  Occupational History    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    Drug use: Never   Sexual activity: Not on file  Other Topics Concern   Not on file  Social History Narrative   Not on file   Social Determinants of Health   Financial Resource Strain: Low Risk  (10/04/2021)   Overall Financial Resource Strain (CARDIA)    Difficulty of Paying Living Expenses: Not hard at all  Food Insecurity: No Food Insecurity (08/17/2021)   Hunger Vital Sign    Worried About Running Out of Food in the Last Year: Never true    Ran Out of Food in the Last Year: Never true  Transportation Needs: No Transportation Needs (08/17/2021)   PRAPARE - Administrator, Civil Service (Medical): No    Lack of Transportation (Non-Medical): No  Physical Activity: Inactive (08/17/2021)   Exercise Vital Sign    Days of Exercise per Week: 0 days    Minutes of Exercise per Session: 0 min  Stress: No Stress Concern Present (08/17/2021)   [employer] of Occupational Health - Occupational Stress Questionnaire    Feeling of Stress : Not at all  Social Connections: Moderately Integrated (08/17/2021)   Social Connection and Isolation Panel [NHANES]    Frequency of Communication with Friends and Family: Twice a week    Frequency of Social Gatherings with Friends and Family: Twice a week    Attends Religious Services: More than 4 times per year    Active Member of [employer] or Organizations: Yes    Attends Banker Meetings: More than 4 times per year    Marital Status: Widowed    ECOG Status: {CHL ONC ECOG [id number]  Review of Systems: A 12  point ROS discussed and pertinent positives are indicated in the HPI above.  All other systems are negative.  Review of Systems  Vital Signs: There were no vitals taken for this visit.  Advance Care Plan: The advanced care plan/surrogate decision maker was discussed at the time of visit and documented in the medical record.    Physical Exam  Imaging: No results found.  Labs:  CBC: Recent Labs    02/20/22 0942 08/23/22 0000 10/22/22 1045  WBC 7.6 9.2  --   HGB 12.6 13.0 12.9  HCT 39.6 40 38.0  PLT 318 350  --     COAGS: Recent Labs    03/15/22 1149 03/15/22 1151  INR  --  1.0  APTT 28.9  --     BMP: Recent Labs    02/20/22 0942 03/15/22 1149 08/23/22 0000 10/22/22 1045  NA 140 139 140 138  K 4.1 3.5 4.3 3.9  CL 98 100 101 98  CO2 28 33* 31*  --   GLUCOSE 88 101*  --  97  BUN 15 18 19 20   CALCIUM 9.4 8.6 8.9  --   CREATININE 0.60 0.71 0.6 0.70    LIVER FUNCTION TESTS: Recent Labs    02/20/22 0942 03/15/22 1149 08/23/22 0000  BILITOT 0.2 0.3  --   AST 16 19 15   ALT 19 17 15   ALKPHOS 54 43  --   PROT 6.2 6.5  --   ALBUMIN 3.7* 3.4* 3.5    TUMOR MARKERS: No results for input(s): "AFPTM", "CEA", "CA199", "CHROMGRNA" in the last 8760 hours.  Assessment and  Plan:  82 year old female with history of left knee pain [facility] ***) secondary to osteoarthritis with lifestyle limiting pain and discomfort refractory to conservative measures including sustained release steroid injections and hyaluronic acid injections.    Thank you for this interesting consult.  I greatly enjoyed meeting Kristi Hunter and look forward to participating in their care.  A copy of this report was sent to the requesting provider on this date.  Electronically Signed: Mickie Kay 01/31/2023, 1:44 PM   I spent a total of 40 Minutes    in face to face in clinical consultation, greater than 50% of which was counseling/coordinating care for left knee pain

## 2023-02-05 ENCOUNTER — Ambulatory Visit
Admission: RE | Admit: 2023-02-05 | Discharge: 2023-02-05 | Disposition: A | Discharge status: Home / Self Care | Payer: Medicare HMO | Source: Ambulatory Visit | Attending: Family Medicine | Admitting: Family Medicine

## 2023-02-05 DIAGNOSIS — G8929 Other chronic pain: Secondary | ICD-10-CM | POA: Diagnosis not present

## 2023-02-05 DIAGNOSIS — M25862 Other specified joint disorders, left knee: Secondary | ICD-10-CM

## 2023-02-05 DIAGNOSIS — M25562 Pain in left knee: Secondary | ICD-10-CM | POA: Diagnosis not present

## 2023-02-05 HISTORY — PX: IR RADIOLOGIST EVAL & MGMT: IMG5224

## 2023-02-06 ENCOUNTER — Other Ambulatory Visit (HOSPITAL_COMMUNITY): Payer: Self-pay | Admitting: Interventional Radiology

## 2023-02-06 DIAGNOSIS — M25562 Pain in left knee: Secondary | ICD-10-CM

## 2023-02-06 DIAGNOSIS — M1712 Unilateral primary osteoarthritis, left knee: Secondary | ICD-10-CM

## 2023-02-08 ENCOUNTER — Other Ambulatory Visit (INDEPENDENT_AMBULATORY_CARE_PROVIDER_SITE_OTHER): Payer: Self-pay | Admitting: Family Medicine

## 2023-02-08 DIAGNOSIS — R7303 Prediabetes: Secondary | ICD-10-CM

## 2023-02-11 ENCOUNTER — Ambulatory Visit (INDEPENDENT_AMBULATORY_CARE_PROVIDER_SITE_OTHER): Payer: Medicare HMO | Admitting: Family Medicine

## 2023-02-11 ENCOUNTER — Encounter (INDEPENDENT_AMBULATORY_CARE_PROVIDER_SITE_OTHER): Payer: Self-pay | Admitting: Family Medicine

## 2023-02-11 VITALS — BP 144/80 | HR 87 | Temp 97.6°F | Ht 63.0 in | Wt 257.0 lb

## 2023-02-11 DIAGNOSIS — Z6841 Body Mass Index (BMI) 40.0 and over, adult: Secondary | ICD-10-CM

## 2023-02-11 DIAGNOSIS — R7303 Prediabetes: Secondary | ICD-10-CM

## 2023-02-11 DIAGNOSIS — R600 Localized edema: Secondary | ICD-10-CM | POA: Diagnosis not present

## 2023-02-11 DIAGNOSIS — I10 Essential (primary) hypertension: Secondary | ICD-10-CM

## 2023-02-11 DIAGNOSIS — E669 Obesity, unspecified: Secondary | ICD-10-CM

## 2023-02-11 DIAGNOSIS — F321 Major depressive disorder, single episode, moderate: Secondary | ICD-10-CM | POA: Diagnosis not present

## 2023-02-11 MED ORDER — METFORMIN HCL 500 MG PO TABS: 1000.0000 mg | ORAL_TABLET | Freq: Every day | ORAL | 0 refills | Status: DC

## 2023-02-11 NOTE — Progress Notes (Signed)
.smr  Office: [phone]  /  Fax: [phone]  WEIGHT SUMMARY AND BIOMETRICS  Anthropometric Measurements Height: 5\' 3"  (1.6 m) Weight: 257 lb (116.6 kg) BMI (Calculated): 45.54 Weight at Last Visit: 260 lb Weight Lost Since Last Visit: 3 lb Weight Gained Since Last Visit: 0 Starting Weight: 267 lb Total Weight Loss (lbs): 10 lb (4.536 kg)   Body Composition  Body Fat %: 59.1 % Fat Mass (lbs): 152.2 lbs Muscle Mass (lbs): 100 lbs Visceral Fat Rating : 23   Other Clinical Data Fasting: No Labs: No Today's Visit #: 35    Chief Complaint: OBESITY     History of Present Illness   The patient is a 82 year old female with a history of obesity, hypertension, and prediabetes. She reports adherence to the category 2 diet plan approximately 70% of the time and has been engaging in pool exercises and aerobics twice a week for 45 minutes each session. Over the past four weeks, she has lost 3 pounds.  The patient denies experiencing hunger but admits to cravings, particularly for chocolate and ice cream. She has implemented portion control strategies, such as consuming half a cup of ice cream at a time. She occasionally feels deprived due to these dietary restrictions.  Breakfast remains a challenge for the patient. She enjoys a large breakfast and recently has been consuming bagels with lox and a slice of tomato. She denies using cream cheese. The patient also reports occasional fluid retention in her lower extremities, which she notes worsens in the summer heat. She takes furosemide as needed for this issue.  The patient is on metformin for her prediabetes. She has been taking two doses in the morning but does not notice a significant difference from when she was taking one dose. She expresses a desire to return to one dose in the morning and an additional dose in the afternoon if needed. She also inquires about the possibility of taking the medication at night.           PHYSICAL EXAM:  Blood pressure (!) 144/80, pulse 87, temperature 97.6 F (36.4 C), height 5\' 3"  (1.6 m), weight 257 lb (116.6 kg), SpO2 94 %. Body mass index is 45.53 kg/m.  DIAGNOSTIC DATA REVIEWED:  BMET    Component Value Date/Time   NA 138 10/22/2022 1045   NA 140 08/23/2022 0000   K 3.9 10/22/2022 1045   CL 98 10/22/2022 1045   CO2 31 (A) 08/23/2022 0000   GLUCOSE 97 10/22/2022 1045   BUN 20 10/22/2022 1045   BUN 19 08/23/2022 0000   CREATININE 0.70 10/22/2022 1045   CREATININE 0.76 12/11/2021 1226   CALCIUM 8.9 08/23/2022 0000   GFRNONAA >60 12/11/2021 1226   GFRAA 104 02/22/2020 1339   Lab Results  Component Value Date   HGBA1C 5.8 08/23/2022   HGBA1C 6.0 01/13/2019   Lab Results  Component Value Date   INSULIN 22.5 02/20/2022   INSULIN 21.5 02/22/2020   Lab Results  Component Value Date   TSH 3.10 08/23/2022   CBC    Component Value Date/Time   WBC 9.2 08/23/2022 0000   WBC 7.9 12/11/2021 1226   WBC 11.8 (H) 05/02/2021 1614   RBC 4.60 08/23/2022 0000   HGB 12.9 10/22/2022 1045   HGB 12.6 02/20/2022 0942   HCT 38.0 10/22/2022 1045   HCT 39.6 02/20/2022 0942   PLT 350 08/23/2022 0000   PLT 318 02/20/2022 0942   MCV 87 02/20/2022 0942   MCH 27.6 02/20/2022  0942   MCH 27.2 12/11/2021 1226   MCHC 31.8 02/20/2022 0942   MCHC 31.9 12/11/2021 1226   RDW 13.0 02/20/2022 0942   Iron Studies No results found for: "IRON", "TIBC", "FERRITIN", "IRONPCTSAT" Lipid Panel     Component Value Date/Time   CHOL 161 08/23/2022 0000   CHOL 159 02/20/2022 0942   TRIG 58 08/23/2022 0000   HDL 64 08/23/2022 0000   HDL 49 02/20/2022 0942   CHOLHDL 3 01/13/2019 0913   VLDL 14.4 01/13/2019 0913   LDLCALC 84 08/23/2022 0000   LDLCALC 98 02/20/2022 0942   Hepatic Function Panel     Component Value Date/Time   PROT 6.5 03/15/2022 1149   PROT 6.2 02/20/2022 0942   ALBUMIN 3.5 08/23/2022 0000   ALBUMIN 3.7 (L) 02/20/2022 0942   AST 15 08/23/2022 0000   AST  19 12/11/2021 1226   ALT 15 08/23/2022 0000   ALT 17 12/11/2021 1226   ALKPHOS 43 03/15/2022 1149   BILITOT 0.3 03/15/2022 1149   BILITOT 0.2 02/20/2022 0942   BILITOT 0.3 12/11/2021 1226   BILIDIR 0.1 03/15/2022 1149      Component Value Date/Time   TSH 3.10 08/23/2022 0000   TSH 1.470 02/22/2020 1339   Nutritional Lab Results  Component Value Date   VD25OH 32.7 02/20/2022   VD25OH 35.1 12/25/2021   VD25OH 46.8 04/12/2021     Assessment and Plan    Obesity: Patient has lost 3 pounds over the last 4 weeks by following the category 2 plan approximately 70% of the time and doing pool exercise and aerobics 45 minutes 2 times per week. Struggles with cravings for chocolate and ice cream. -Continue category 2 plan and exercise regimen. -Consider portion control and sugar-free options for cravings.  Prediabetes: Patient is taking metformin 500mg  twice daily, but sometimes only takes one dose. No issues reported with metformin. -Continue metformin 500mg  twice daily, but patient may take one dose on some days. -Encourage patient to continue dietary modifications, including limiting intake of bagels and focusing on protein intake.  Hypertension: Blood pressure slightly elevated at this visit, but patient does not typically have issues with hypertension. Patient is on chlorthalidone and takes Lasix as needed for lower extremity swelling, which worsens in the summer. -Continue current medication regimen. -Encourage patient to hydrate to help manage sodium levels and fluid retention.  Follow-up in 4 weeks. If patient needs anything in the meantime, she should send a MyChart message.        She was informed of the importance of frequent follow up visits to maximize her success with intensive lifestyle modifications for her multiple health conditions.    Quillian Quince, MD

## 2023-03-05 IMAGING — US US  BREAST BX W/ LOC DEV 1ST LESION IMG BX SPEC US GUIDE*R*
1 series · 12 of 12 positions shown · non-contrast
Comparison: Previous exam(s).
COMPARISON: Previous exam(s).

Addendum:
CLINICAL DATA: Patient presents for ultrasound-guided core needle
biopsy of an irregular right breast mass.

EXAM:
ULTRASOUND GUIDED RIGHT BREAST CORE NEEDLE BIOPSY

[Series 1: us breast bx w/ loc dev 1st lesion img bx spec us  · 0.06mm/px · 12 of 12 slices shown]
[im 1/12]
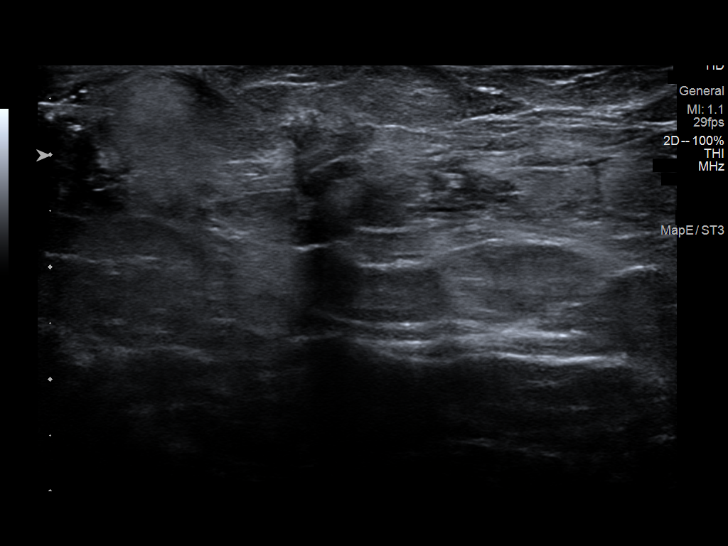
[im 2/12]
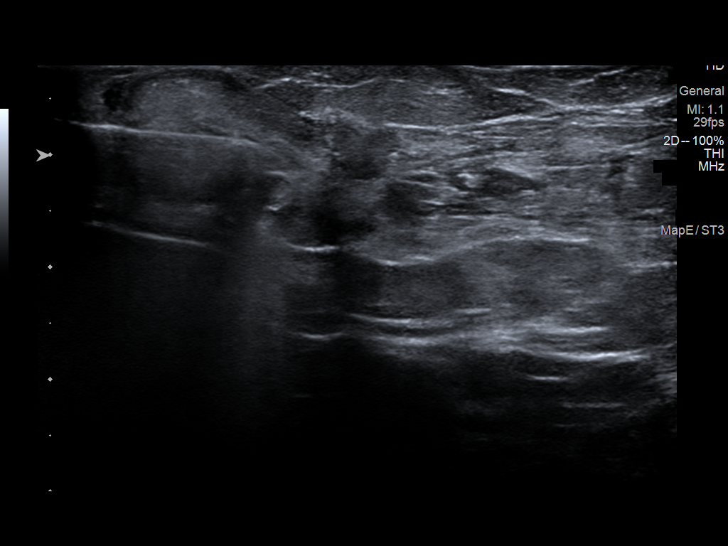
[im 3/12]
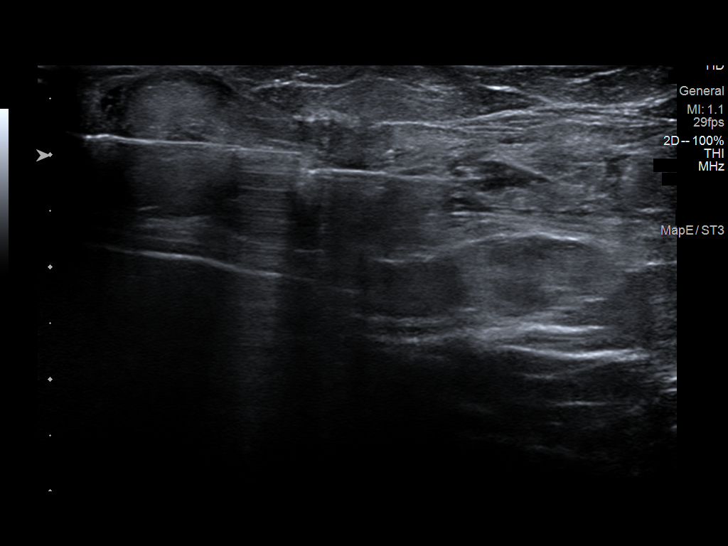
[im 4/12]
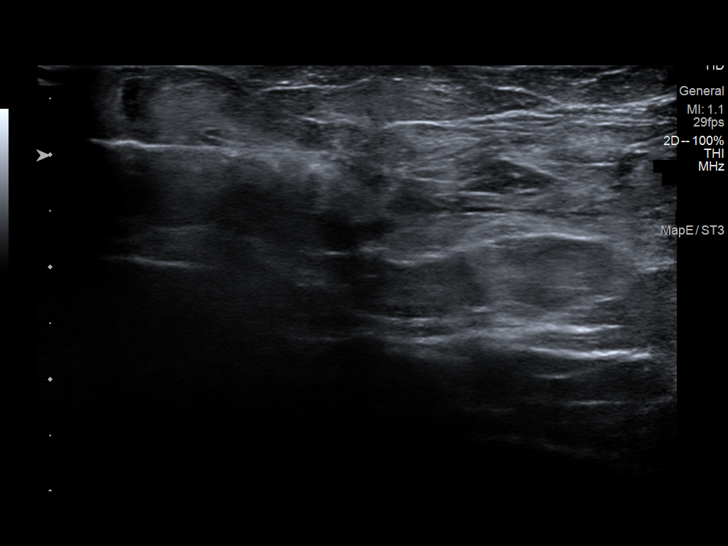
[im 5/12]
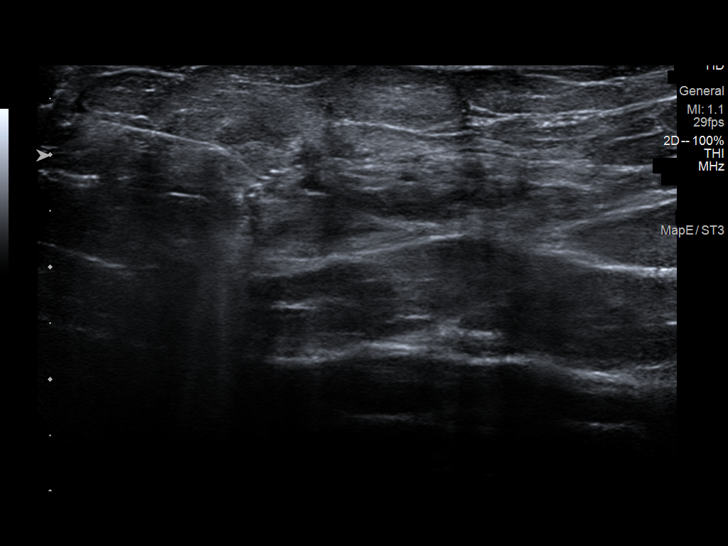
[im 6/12]
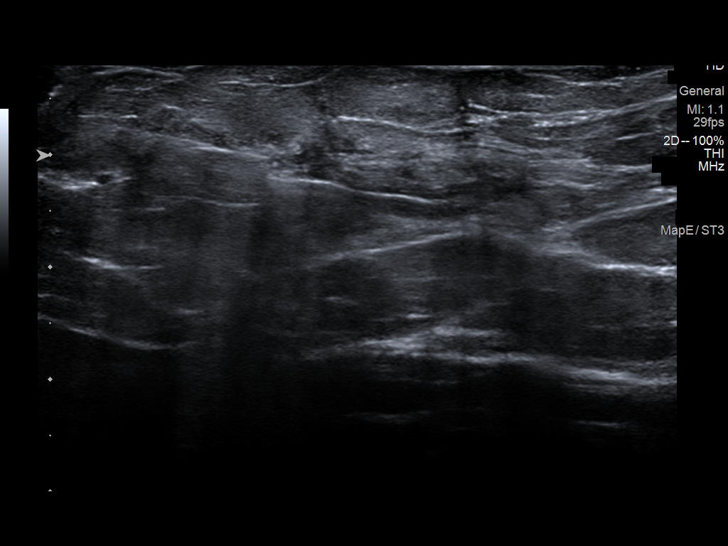
[im 7/12]
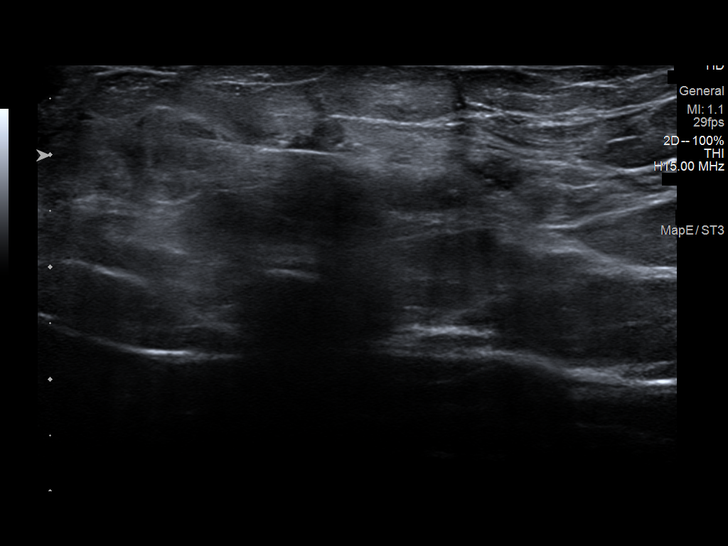
[im 8/12]
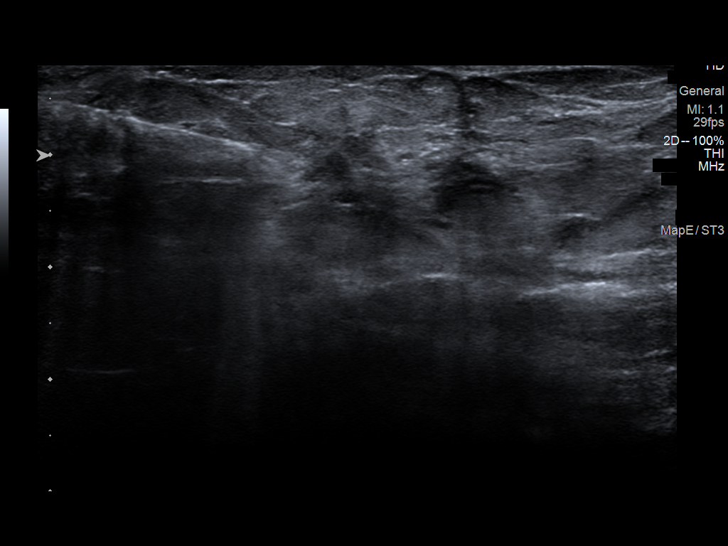
[im 9/12]
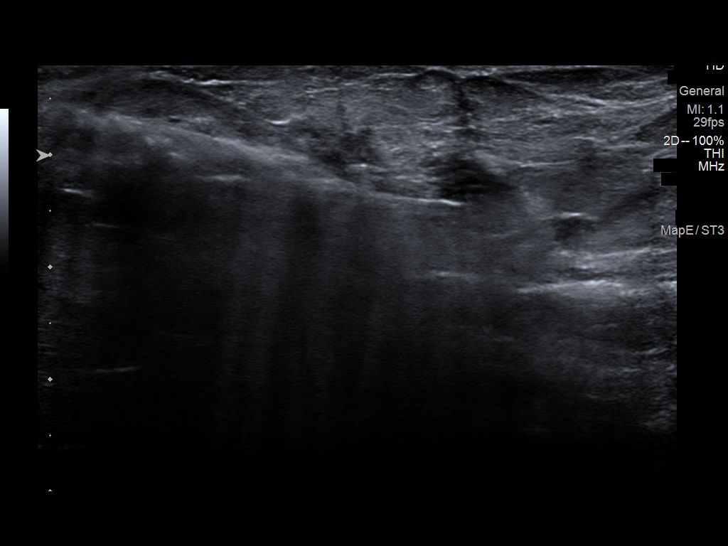
[im 10/12]
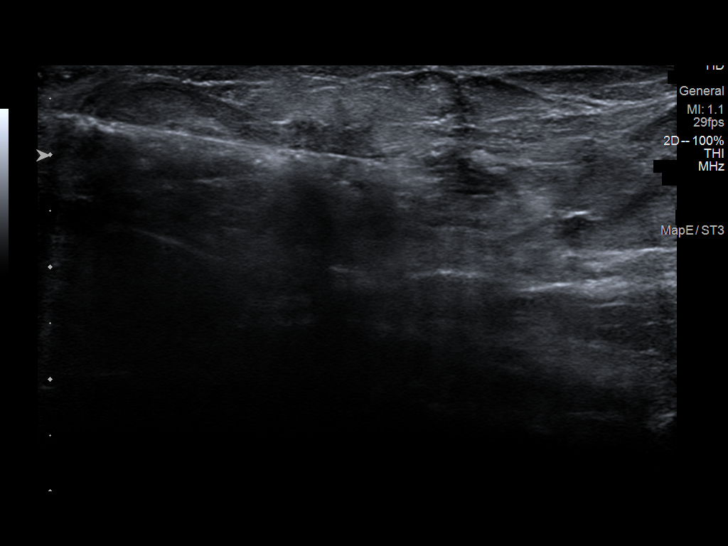
[im 11/12]
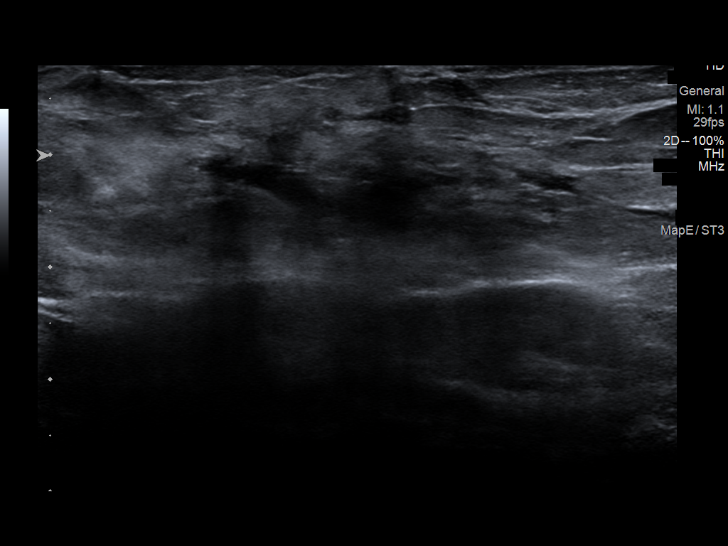
[im 12/12]
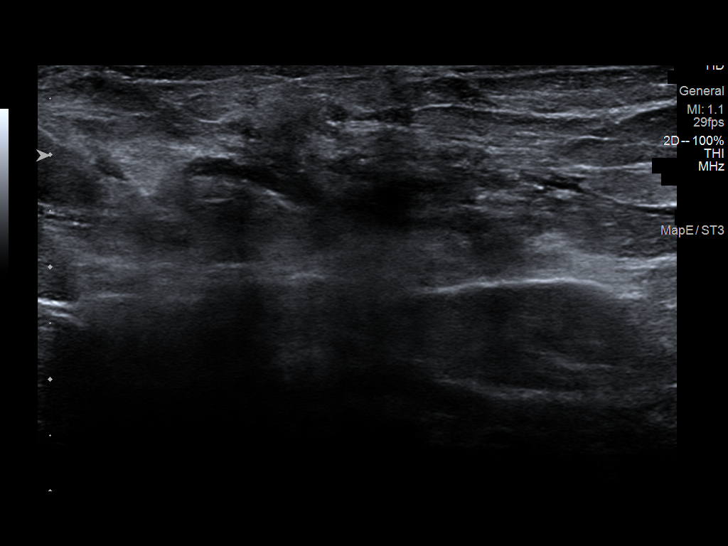

[12 of 12 positions shown; findings below may reference images not displayed]



Lesion quadrant: Upper outer quadrant

Using sterile technique and 1% Lidocaine as local anesthetic, under
direct ultrasound visualization, a 12 gauge Quyen device was
used to perform biopsy of the 10 o'clock position irregular
hypoechoic mass and associated architectural distortion using an
inferior approach. At the conclusion of the procedure ribbon shaped
tissue marker clip was deployed into the biopsy cavity. Follow up 2
view mammogram was performed and dictated separately.
IMPRESSION: Ultrasound guided biopsy of a right breast mass. No apparent
complications.

ADDENDUM:
Pathology revealed GRADE II INVASIVE DUCTAL CARCINOMA WITH
CALCIFICATIONS, INTERMEDIATE GRADE DUCTAL CARCINOMA IN SITU of the
Right breast, 10 o'clock, 4cmfn. This was found to be concordant by
Dr. Shukri Rodrigez.

Pathology results were discussed with the patient by telephone. The
patient reported doing well after the biopsy with minimal tenderness
at the site. Post biopsy instructions and care were reviewed and
questions were answered. The patient was encouraged to call The
direct phone number was provided.

The patient was referred to [REDACTED]
[REDACTED] at [REDACTED] on
February 14, 2021.

Pathology results reported by Aqua Ribera, RN on 02/06/2021.



Lesion quadrant: Upper outer quadrant

Using sterile technique and 1% Lidocaine as local anesthetic, under
direct ultrasound visualization, a 12 gauge Quyen device was
used to perform biopsy of the 10 o'clock position irregular
hypoechoic mass and associated architectural distortion using an
inferior approach. At the conclusion of the procedure ribbon shaped
tissue marker clip was deployed into the biopsy cavity. Follow up 2
view mammogram was performed and dictated separately.
IMPRESSION: Ultrasound guided biopsy of a right breast mass. No apparent
complications.

## 2023-03-05 IMAGING — MG MM BREAST LOCALIZATION CLIP
4 series · 4 of 12 positions shown · non-contrast
Comparison: Previous exam(s).

CLINICAL DATA: Evaluate post biopsy marker clip placement following
ultrasound-guided core needle biopsy of a right breast mass.

EXAM:
3D DIAGNOSTIC RIGHT MAMMOGRAM POST ULTRASOUND BIOPSY

[R ML synth-2D]
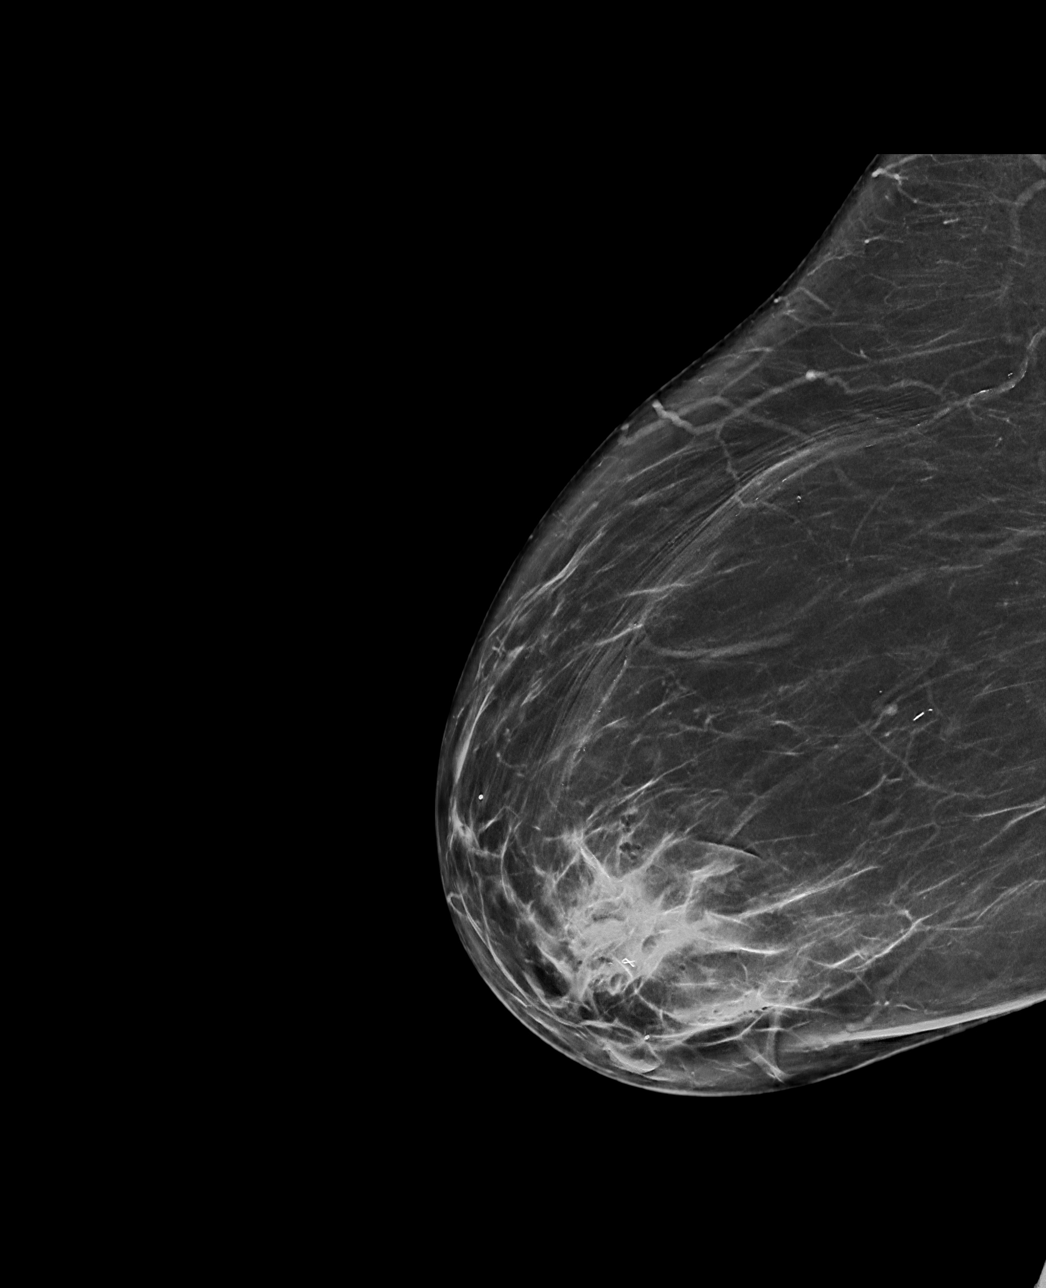

[R CC synth-2D]
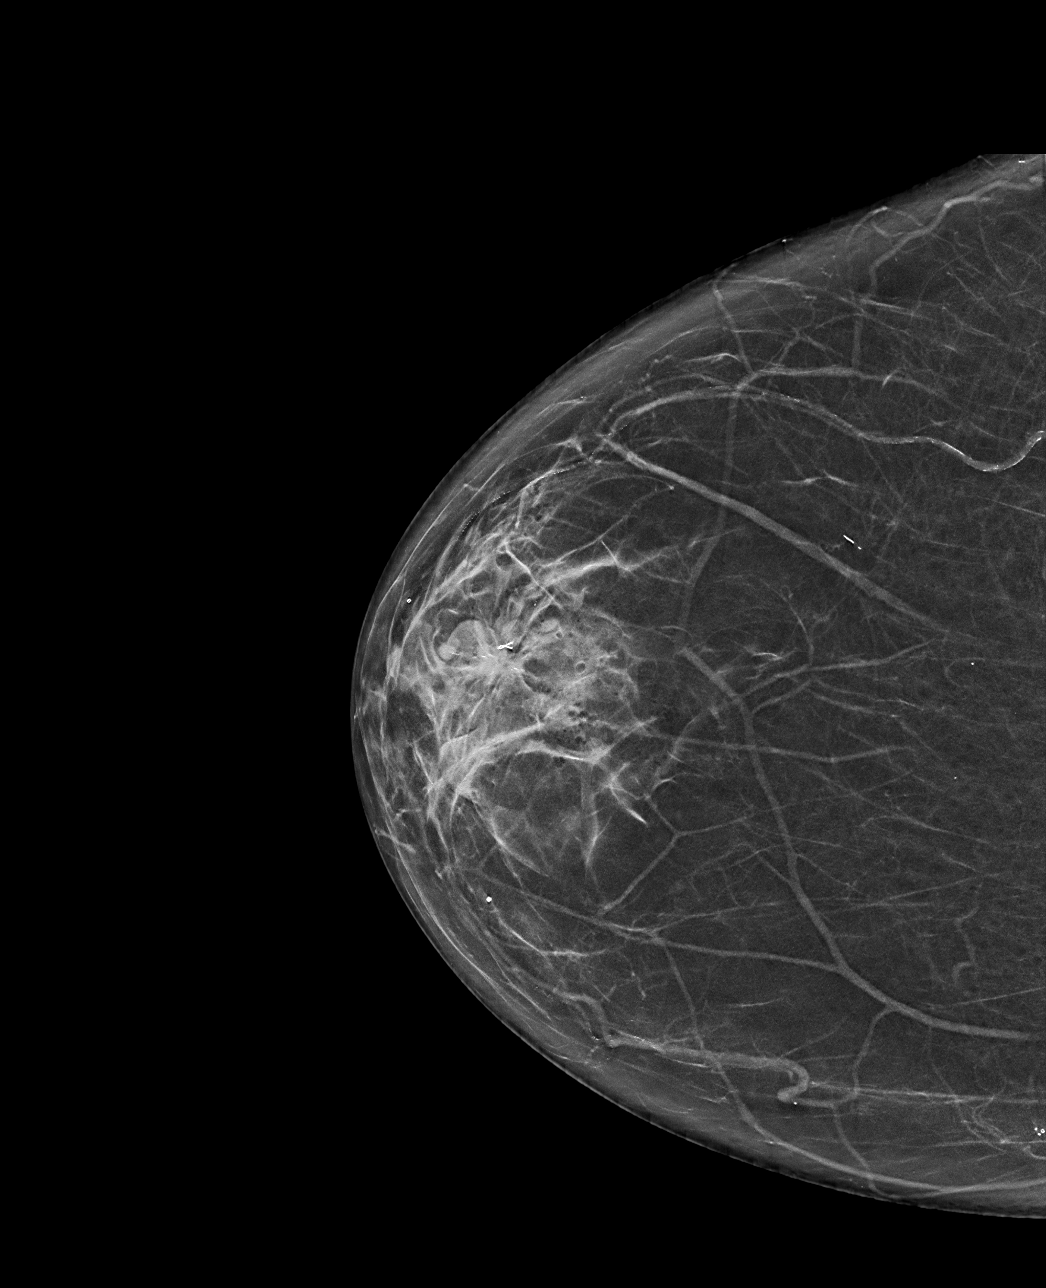

[R CC tomo · tomo slice 32/63.0]
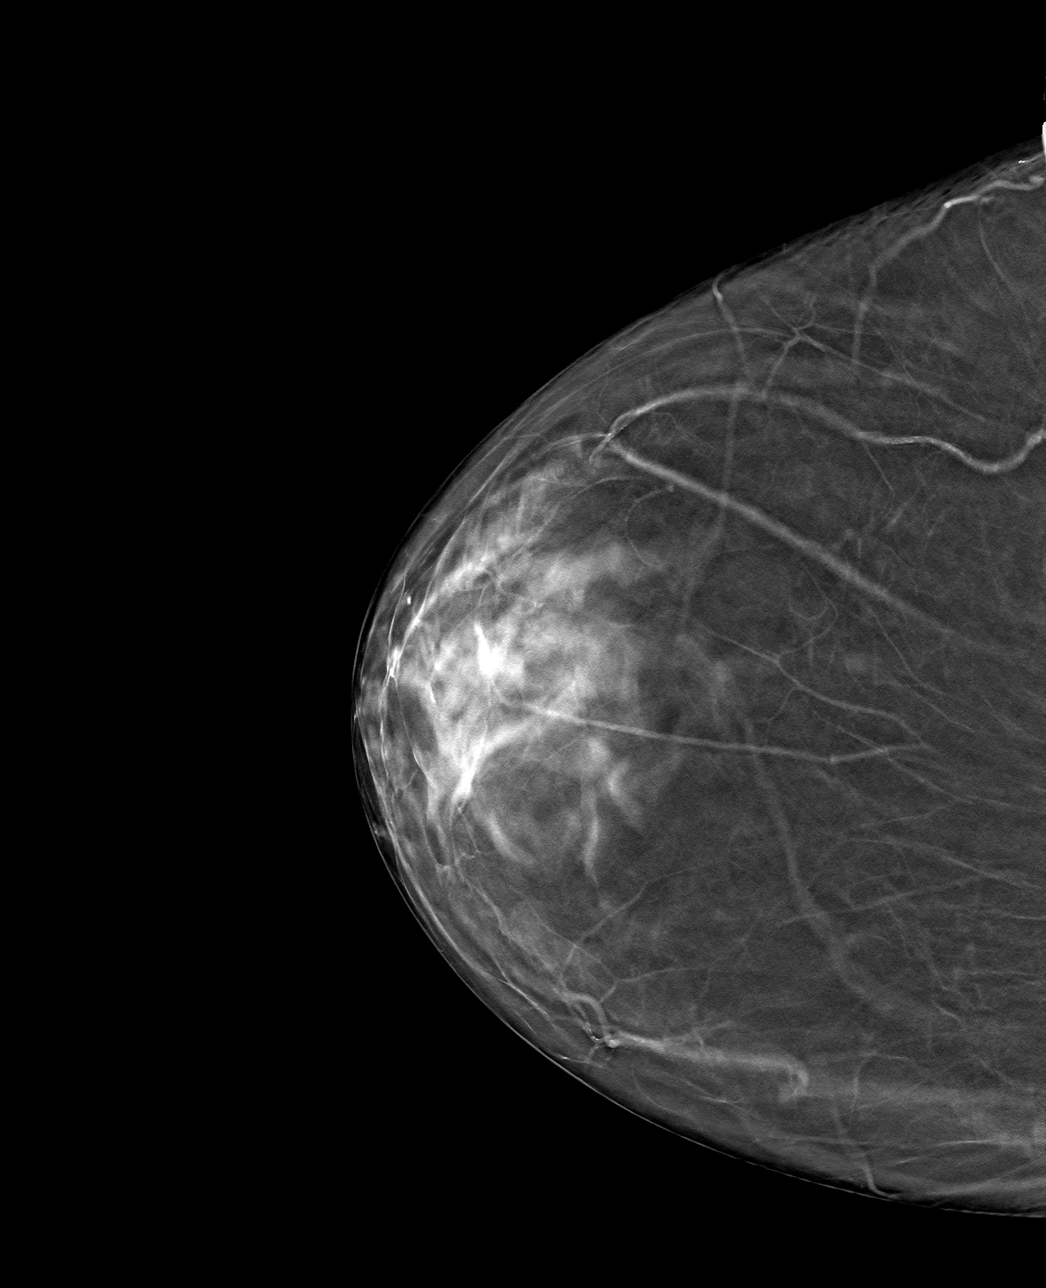

[R ML tomo · tomo slice 39/78.0]
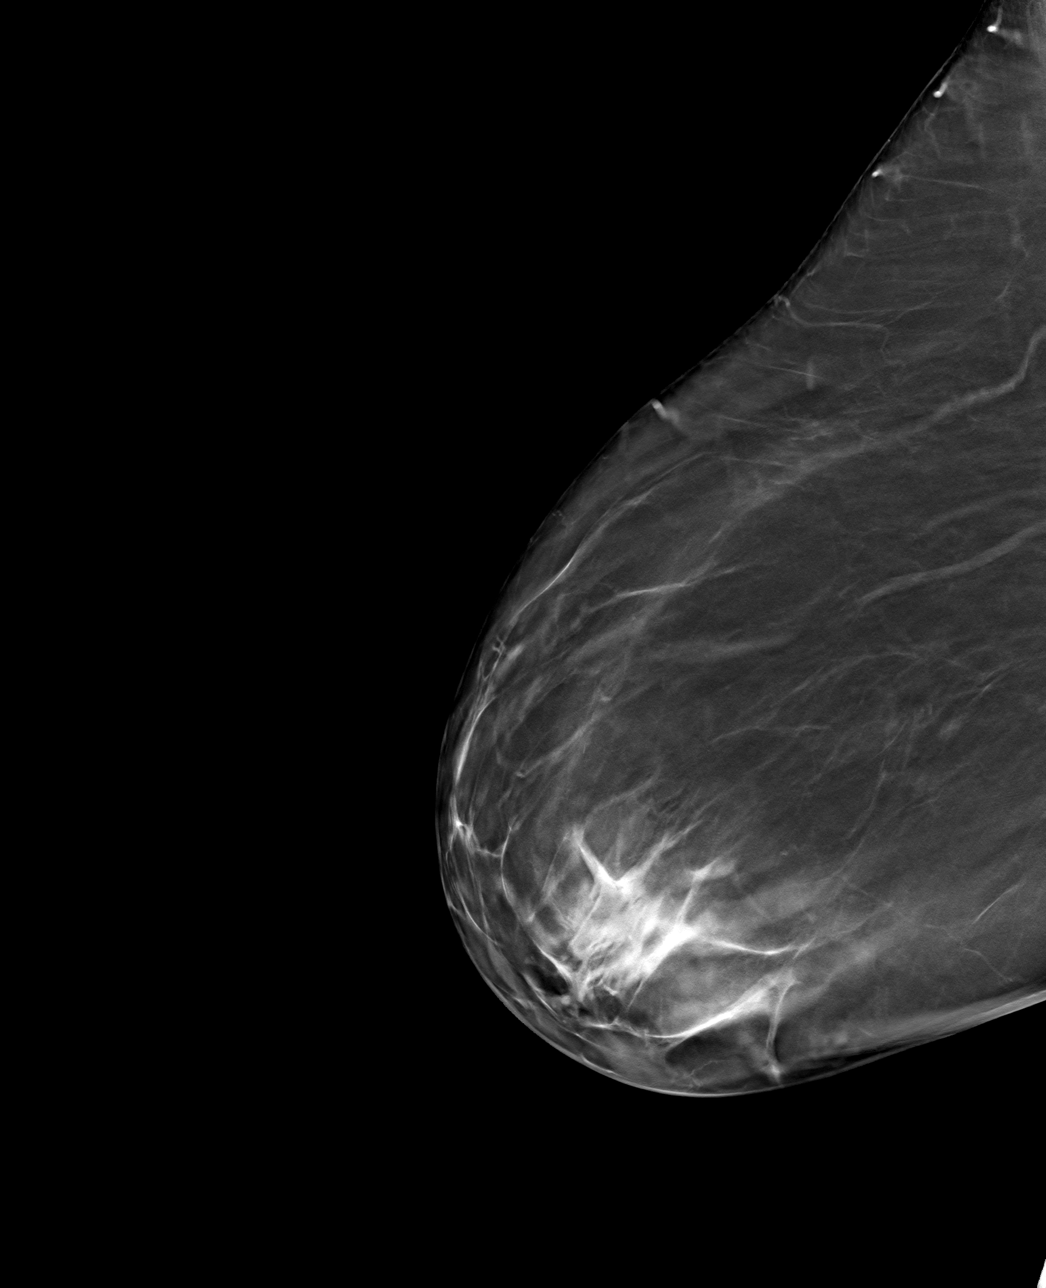

[4 of 12 positions shown; findings below may reference images not displayed]

FINDINGS: 3D Mammographic images were obtained following ultrasound guided
biopsy of a right breast mass. The biopsy marking clip is in
expected position at the site of biopsy.
IMPRESSION: Appropriate positioning of the ribbon shaped biopsy marking clip at
the site of biopsy in the along the lateral margin of the mass and
architectural distortion.

Final Assessment: Post Procedure Mammograms for Marker Placement

## 2023-03-06 ENCOUNTER — Other Ambulatory Visit (INDEPENDENT_AMBULATORY_CARE_PROVIDER_SITE_OTHER): Payer: Self-pay | Admitting: Family Medicine

## 2023-03-06 ENCOUNTER — Encounter: Payer: Self-pay | Admitting: Internal Medicine

## 2023-03-06 ENCOUNTER — Telehealth: Payer: Self-pay | Admitting: Internal Medicine

## 2023-03-06 ENCOUNTER — Ambulatory Visit: Payer: Medicare HMO | Attending: Internal Medicine | Admitting: Internal Medicine

## 2023-03-06 VITALS — BP 142/70 | HR 91 | Ht 63.5 in | Wt 263.2 lb

## 2023-03-06 DIAGNOSIS — R600 Localized edema: Secondary | ICD-10-CM

## 2023-03-06 DIAGNOSIS — R7303 Prediabetes: Secondary | ICD-10-CM

## 2023-03-06 MED ORDER — FUROSEMIDE 20 MG PO TABS
20.0000 mg | ORAL_TABLET | Freq: Every day | ORAL | Status: DC
Start: 2023-03-06 — End: 2023-04-10

## 2023-03-06 NOTE — Telephone Encounter (Signed)
Dois Davenport from [facility] stated she was speaking with a nurse about the patient's lab orders. Dois Davenport stated she forgot to ask for a dx for the lab orders, but was unsure of who she spoken with. Please advise.

## 2023-03-06 NOTE — Progress Notes (Signed)
Cardiology Office Note:    Date:  03/06/2023   ID:  [name], DOB 28-Nov-1940, MRN [id number]  PCP:  Nadara Eaton, MD   [facility] Providers Cardiologist:  None     Referring MD: Wilder Glade, MD   No chief complaint on file. LE edema  History of Present Illness:    Kristi Hunter is a 82 y.o. female with a hx of anxiety, obesity, breast cancer 02/2021, , bronchitis, HTN, OSA referral from Dr. Dalbert Garnet from [facility]. The referral ICD-10 code was BL LE edema.  She saw a cardiologist in 2017 for CP.  She had a stress test in [place]. This was normal. No hx of heart failure. She has had issues with LE edema. Tried compression stockings. After raising her legs edema improves. She denies angina, dyspnea on exertion, lower extremity edema, PND or orthopnea.  She does go to the gym; she lives in independent living. She can walk for about a block and has had long term shortness of breath. Since COVID she has done limited activity. She has knee pain. She likes to eat bagels, cookies and ice cream.   Past Medical History:  Diagnosis Date   Anemia    Anxiety    Arthritis    several different parts of the body per pt 10/17/2022   Back pain    Breast cancer (HCC)    02/20/21   Cancer (HCC)    uterine   Chest pain    Chicken pox    Chronic kidney disease    Constipation    Emphysema of lung (HCC)    Family history of breast cancer 02/14/2021   Glaucoma    Hypertension    Joint pain    Lower extremity edema    Lymphedema    Osteoarthritis    Personal history of malignant neoplasm of uterus 02/14/2021   Pre-diabetes    Prediabetes    Shortness of breath    Sleep apnea    doesn't use CPAP machine anymore   Urine incontinence    Uterine cancer (HCC)    2011   UTI (urinary tract infection)    Vertigo    had vertigo last week. 10/17/2022   Vitamin B 12 deficiency    during pregnancy    Past Surgical History:  Procedure  Laterality Date   ABDOMINAL HYSTERECTOMY  2011   BOTOX INJECTION N/A 10/22/2022   Procedure: BOTOX INJECTION 100 UNITS;  Surgeon: Noel Christmas, MD;  Location: [facility];  Service: Urology;  Laterality: N/A;   BREAST LUMPECTOMY WITH RADIOACTIVE SEED LOCALIZATION Right 02/20/2021   Procedure: RIGHT BREAST LUMPECTOMY WITH RADIOACTIVE SEED LOCALIZATION;  Surgeon: Almond Lint, MD;  Location: [facility];  Service: General;  Laterality: Right;   CYSTOSCOPY WITH INJECTION N/A 10/22/2022   Procedure: CYSTOSCOPY WITH INJECTION OF Mariam Dollar;  Surgeon: Noel Christmas, MD;  Location: [facility];  Service: Urology;  Laterality: N/A;   IR RADIOLOGIST EVAL & MGMT  02/05/2023   REPLACEMENT TOTAL KNEE Right 2012    Current Medications: Current Outpatient Medications on File Prior to Visit  Medication Sig Dispense Refill   acetaminophen (TYLENOL) 650 MG CR tablet Take 650 mg by mouth every 8 (eight) hours as needed for pain. Takes usually once a day     alendronate (FOSAMAX) 70 MG tablet TAKE 1 TABLET (70 MG TOTAL) BY MOUTH EVERY 7 (SEVEN) DAYS. TAKE WITH A FULL GLASS OF WATER ON AN  EMPTY STOMACH. 12 tablet 3   aspirin 81 MG EC tablet Take 81 mg by mouth daily. Swallow whole.     calcium carbonate (OS-CAL) 600 MG TABS tablet Take 1 tablet (600 mg total) by mouth 2 (two) times daily with a meal. (Patient taking differently: Take 600 mg by mouth as needed.) 180 tablet 4   chlorthalidone (HYGROTON) 25 MG tablet Take 1 tablet (25 mg total) by mouth daily. 30 tablet 0   diclofenac Sodium (VOLTAREN) 1 % GEL Apply 2 g topically daily as needed (Osteoarthritis).     furosemide (LASIX) 20 MG tablet Take 1 tablet (20 mg total) by mouth daily. (Patient taking differently: Take 20 mg by mouth as needed.) 10 tablet 0   Melatonin 10 MG TABS Take 10 mg by mouth at bedtime as needed (sleep).     metFORMIN (GLUCOPHAGE) 500 MG tablet Take 2 tablets (1,000 mg total) by mouth daily with breakfast.  60 tablet 0   metroNIDAZOLE (METROGEL) 1 % gel Apply topically daily. (Patient taking differently: Apply 1 application  topically daily as needed (rosacea).) 60 g 1   Multiple Vitamin (MULTIVITAMIN) tablet Take 1 tablet by mouth daily.     tamoxifen (NOLVADEX) 20 MG tablet Take 1 tablet (20 mg total) by mouth daily. 90 tablet 3   No current facility-administered medications on file prior to visit.     Allergies:   Amoxicillin and Percocet [oxycodone-acetaminophen]   Social History   Socioeconomic History   Marital status: Widowed    Spouse name: Not on file   Number of children: 3   Years of education: Not on file   Highest education level: Not on file  Occupational History   Occupation: Reitired IT trainer    Comment: Retired IT trainer  Tobacco Use   Smoking status: Former    Current packs/day: 0.00    Types: Cigarettes    Quit date: 08/12/1982    Years since quitting: 40.5    Passive exposure: Never   Smokeless tobacco: Never  Vaping Use   Vaping status: Never Used  Substance and Sexual Activity   Alcohol use: Yes    Alcohol/week: 1.0 standard drink of alcohol    Types: 1 Glasses of wine per week    Comment: every 1-2 wks.    Drug use: Never   Sexual activity: Not on file  Other Topics Concern   Not on file  Social History Narrative   Not on file   Social Determinants of Health   Financial Resource Strain: Low Risk  (10/04/2021)   Overall Financial Resource Strain (CARDIA)    Difficulty of Paying Living Expenses: Not hard at all  Food Insecurity: No Food Insecurity (08/17/2021)   Hunger Vital Sign    Worried About Running Out of Food in the Last Year: Never true    Ran Out of Food in the Last Year: Never true  Transportation Needs: No Transportation Needs (08/17/2021)   PRAPARE - Administrator, Civil Service (Medical): No    Lack of Transportation (Non-Medical): No  Physical Activity: Inactive (08/17/2021)   Exercise Vital Sign    Days of Exercise per Week: 0 days     Minutes of Exercise per Session: 0 min  Stress: No Stress Concern Present (08/17/2021)   [employer] of Occupational Health - Occupational Stress Questionnaire    Feeling of Stress : Not at all  Social Connections: Unknown (12/17/2021)   Received from [facility]   Social Network  Social Network: Not on file     Family History: The patient's family history includes Alcohol abuse in her brother, brother, father, and paternal grandfather; Arthritis in her brother, maternal grandmother, and paternal grandfather; Asthma in her father; Breast cancer (age of onset: 42) in her paternal aunt; COPD in her father; Cancer in her brother, father, and mother; Cirrhosis in her paternal grandfather; Colon cancer in her maternal grandfather; Colon polyps in her daughter; Depression in her daughter and son; Diabetes in her maternal grandmother and mother; Drug abuse in her brother; Heart attack in her father and mother; Heart disease in her father and mother; Hyperlipidemia in her maternal grandmother and mother; Hypertension in her daughter and mother; Melanoma in her father and mother; Mental illness in her daughter; Miscarriages / Stillbirths in her mother; Obesity in her father and mother; Stroke in her mother; Thyroid disease in her mother.  ROS:   Please see the history of present illness.     All other systems reviewed and are negative.  EKGs/Labs/Other Studies Reviewed:    The following studies were reviewed today:   EKG Interpretation Date/Time:  Thursday March 06 2023 10:52:02 EDT Ventricular Rate:  97 PR Interval:  140 QRS Duration:  82 QT Interval:  354 QTC Calculation: 449 R Axis:   -42  Text Interpretation: Sinus rhythm with Fusion complexes Left axis deviation Low voltage QRS Inferior infarct , age undetermined Cannot rule out Anterior infarct , age undetermined When compared with ECG of 22-Oct-2022 10:34, Fusion complexes are now Present Minimal criteria for Anterior infarct  are now Present Inferior infarct is now Present Confirmed by Carolan Clines [phone]) on 03/06/2023 10:54:50 AM       Recent Labs: 08/23/2022: ALT 15; Platelets 350; TSH 3.10 10/22/2022: BUN 20; Creatinine, Ser 0.70; Hemoglobin 12.9; Potassium 3.9; Sodium 138   Recent Lipid Panel    Component Value Date/Time   CHOL 161 08/23/2022 0000   CHOL 159 02/20/2022 0942   TRIG 58 08/23/2022 0000   HDL 64 08/23/2022 0000   HDL 49 02/20/2022 0942   CHOLHDL 3 01/13/2019 0913   VLDL 14.4 01/13/2019 0913   LDLCALC 84 08/23/2022 0000   LDLCALC 98 02/20/2022 0942     Risk Assessment/Calculations:     Physical Exam:    VS:   Vitals:   03/06/23 1049  BP: (!) 142/70  Pulse: 91  SpO2: 95%     Wt Readings from Last 3 Encounters:  02/11/23 257 lb (116.6 kg)  02/05/23 261 lb (118.4 kg)  01/17/23 261 lb 12.8 oz (118.8 kg)     GEN:  Well nourished, well developed in no acute distress HEENT: Normal NECK: No JVD; No carotid bruits LYMPHATICS: No lymphadenopathy CARDIAC: RRR, no murmurs, rubs, gallops RESPIRATORY:  Clear to auscultation without rales, wheezing or rhonchi  ABDOMEN: Soft, non-tender, non-distended MUSCULOSKELETAL:  No edema; No deformity  SKIN: Warm and dry NEUROLOGIC:  Alert and oriented x 3 PSYCHIATRIC:  Normal affect   ASSESSMENT:   LE Ext Edema BL DOE - DOE is not progressive. EKG shows no ischemia.  - ddx is broad. This includes obesity , recent bronchitis. Does have pitting edema. This can be related to HFpEF or lymphedema.  PLAN:    In order of problems listed above:  BNP, TTE Continue lasix 20 mg PRN; she is hesitant to take daily Discussed diet substitutions including English Muffins and yogurt Recommended compression stockings, she is unable to use them Follow up pending results  Medication Adjustments/Labs and Tests Ordered: Current medicines are reviewed at length with the patient today.  Concerns regarding medicines are outlined above.  No orders of  the defined types were placed in this encounter.  No orders of the defined types were placed in this encounter.   There are no Patient Instructions on file for this visit.   Signed, Maisie Fus, MD  03/06/2023 8:47 AM    [facility]

## 2023-03-06 NOTE — Telephone Encounter (Signed)
Kristi Hunter called from [facility] to  get diagnosis for blood work. She used hypertension

## 2023-03-06 NOTE — Patient Instructions (Signed)
Medication Instructions:  Your physician recommends that you continue on your current medications as directed. Please refer to the Current Medication list given to you today.  *If you need a refill on your cardiac medications before your next appointment, please call your pharmacy*   Lab Work: BNP If you have labs (blood work) drawn today and your tests are completely normal, you will receive your results only by: MyChart Message (if you have MyChart) OR A paper copy in the mail If you have any lab test that is abnormal or we need to change your treatment, we will call you to review the results.   Testing/Procedures: Your physician has requested that you have an echocardiogram. Echocardiography is a painless test that uses sound waves to create images of your heart. It provides your doctor with information about the size and shape of your heart and how well your heart's chambers and valves are working. This procedure takes approximately one hour. There are no restrictions for this procedure. Please do NOT wear cologne, perfume, aftershave, or lotions (deodorant is allowed). Please arrive 15 minutes prior to your appointment time.    Follow-Up: At [facility], you and your health needs are our priority.  As part of our continuing mission to provide you with exceptional heart care, we have created designated Provider Care Teams.  These Care Teams include your primary Cardiologist (physician) and Advanced Practice Providers (APPs -  Physician Assistants and Nurse Practitioners) who all work together to provide you with the care you need, when you need it.    Your next appointment:   As needed   Dr Wyline Mood

## 2023-03-12 ENCOUNTER — Ambulatory Visit (INDEPENDENT_AMBULATORY_CARE_PROVIDER_SITE_OTHER): Payer: Medicare HMO | Admitting: Family Medicine

## 2023-03-13 ENCOUNTER — Ambulatory Visit (INDEPENDENT_AMBULATORY_CARE_PROVIDER_SITE_OTHER): Payer: Medicare HMO | Admitting: Family Medicine

## 2023-03-13 ENCOUNTER — Other Ambulatory Visit: Payer: Self-pay | Admitting: Interventional Radiology

## 2023-03-13 DIAGNOSIS — I110 Hypertensive heart disease with heart failure: Secondary | ICD-10-CM | POA: Diagnosis not present

## 2023-03-13 DIAGNOSIS — M1712 Unilateral primary osteoarthritis, left knee: Secondary | ICD-10-CM

## 2023-03-13 DIAGNOSIS — M25562 Pain in left knee: Secondary | ICD-10-CM

## 2023-03-17 ENCOUNTER — Other Ambulatory Visit: Payer: Self-pay | Admitting: Radiology

## 2023-03-17 DIAGNOSIS — F341 Dysthymic disorder: Secondary | ICD-10-CM | POA: Diagnosis not present

## 2023-03-17 DIAGNOSIS — F4389 Other reactions to severe stress: Secondary | ICD-10-CM | POA: Diagnosis not present

## 2023-03-17 DIAGNOSIS — M1712 Unilateral primary osteoarthritis, left knee: Secondary | ICD-10-CM

## 2023-03-17 NOTE — H&P (Signed)
Referring Physician(s): Corey,E  Supervising Physician: Marliss Coots  Patient Status:  [facility] OP  Chief Complaint:  Left knee pain  Subjective: Pt known to IR team from consultation with Dr. Elby Showers on 02/05/2023 to discuss treatment options for osteoarthritic left knee pain. She is a 82 y.o. female with a medical history significant for HTN, CKD, anxiety, uterine cancer, breast cancer s/p lumpectomy and adjuvant radiotherapy (2022) and right total knee replacement.  She also has a history of left knee pain and has been followed by Dr. [name] since 2021. She has received physical therapy and numerous steroid injections as well as the Orthovisc series in attempts to alleviate her knee pain. Steroid injections have provides temporary relief and her last injection was December 2023. Earlier this year she developed worsening posterior knee pain and swelling. MR knee 01/02/23 showed a dominant finding of advanced tricompartmental osteoarthritis which is the likely culprit of her knee pain. She is not an ideal candidate for a knee replacement due to her BMI of 46.  Following discussion with Dr. Elby Showers patient was deemed an appropriate candidate for left geniculate artery embolization and presents today for the procedure. She denies fever,HA, CP, abd pain,N/V or bleeding. She does have back pain, left knee pain, dyspnea with exertion, occ cough.    Past Medical History:  Diagnosis Date   Anemia    Anxiety    Arthritis    several different parts of the body per pt 10/17/2022   Back pain    Breast cancer (HCC)    02/20/21   Cancer (HCC)    uterine   Chest pain    Chicken pox    Chronic kidney disease    Constipation    Emphysema of lung (HCC)    Family history of breast cancer 02/14/2021   Glaucoma    Hypertension    Joint pain    Lower extremity edema    Lymphedema    Osteoarthritis    Personal history of malignant neoplasm of uterus 02/14/2021   Pre-diabetes    Prediabetes     Shortness of breath    Sleep apnea    doesn't use CPAP machine anymore   Urine incontinence    Uterine cancer (HCC)    2011   UTI (urinary tract infection)    Vertigo    had vertigo last week. 10/17/2022   Vitamin B 12 deficiency    during pregnancy   Past Surgical History:  Procedure Laterality Date   ABDOMINAL HYSTERECTOMY  2011   BOTOX INJECTION N/A 10/22/2022   Procedure: BOTOX INJECTION 100 UNITS;  Surgeon: Noel Christmas, MD;  Location: [facility];  Service: Urology;  Laterality: N/A;   BREAST LUMPECTOMY WITH RADIOACTIVE SEED LOCALIZATION Right 02/20/2021   Procedure: RIGHT BREAST LUMPECTOMY WITH RADIOACTIVE SEED LOCALIZATION;  Surgeon: Almond Lint, MD;  Location: [facility];  Service: General;  Laterality: Right;   CYSTOSCOPY WITH INJECTION N/A 10/22/2022   Procedure: CYSTOSCOPY WITH INJECTION OF Mariam Dollar;  Surgeon: Noel Christmas, MD;  Location[facility];  Service: Urology;  Laterality: N/A;   IR RADIOLOGIST EVAL & MGMT  02/05/2023   REPLACEMENT TOTAL KNEE Right 2012      Allergies: Amoxicillin and Percocet [oxycodone-acetaminophen]  Medications: Prior to Admission medications   Medication Sig Start Date End Date Taking? Authorizing Provider  acetaminophen (TYLENOL) 650 MG CR tablet Take 650 mg by mouth every 8 (eight) hours as needed for pain. Takes usually once a day  [provider]  alendronate (FOSAMAX) 70 MG tablet TAKE 1 TABLET (70 MG TOTAL) BY MOUTH EVERY 7 (SEVEN) DAYS. TAKE WITH A FULL GLASS OF WATER ON AN EMPTY STOMACH. 04/04/22   Mliss Sax, MD  aspirin 81 MG EC tablet Take 81 mg by mouth daily. Swallow whole.    [provider]  calcium carbonate (OS-CAL) 600 MG TABS tablet Take 1 tablet (600 mg total) by mouth 2 (two) times daily with a meal. Patient taking differently: Take 600 mg by mouth as needed. 05/10/21   Mliss Sax, MD  chlorthalidone (HYGROTON) 25 MG tablet Take 1 tablet (25  mg total) by mouth daily. 03/15/22   Mliss Sax, MD  diclofenac Sodium (VOLTAREN) 1 % GEL Apply 2 g topically daily as needed (Osteoarthritis).    [provider]  furosemide (LASIX) 20 MG tablet Take 1 tablet (20 mg total) by mouth daily. 03/06/23 06/04/23  Maisie Fus, MD  Melatonin 10 MG TABS Take 10 mg by mouth at bedtime as needed (sleep).    [provider]  metFORMIN (GLUCOPHAGE) 500 MG tablet Take 2 tablets (1,000 mg total) by mouth daily with breakfast. 02/11/23   Quillian Quince D, MD  metroNIDAZOLE (METROGEL) 1 % gel Apply topically daily. Patient taking differently: Apply 1 application  topically daily as needed (rosacea). 01/29/21   Mliss Sax, MD  Multiple Vitamin (MULTIVITAMIN) tablet Take 1 tablet by mouth daily.    [provider]  tamoxifen (NOLVADEX) 20 MG tablet Take 1 tablet (20 mg total) by mouth daily. 05/27/22   [name] Moulds, MD     Vital Signs: Vitals:   03/18/23 0747  BP: (!) 140/69  Pulse: 86  Resp: 16  Temp: 98.4 F (36.9 C)  SpO2: 92%       Code Status: FULL CODE  Physical Exam: awake/alert; chest- distant BS bilat;  heart- RRR; abd- obese, soft,+BS,NT; bilat pretibial edema noted  Imaging: No results found.  Labs:  CBC: Recent Labs    08/23/22 0000 10/22/22 1045  WBC 9.2  --   HGB 13.0 12.9  HCT 40 38.0  PLT 350  --     COAGS: No results for input(s): "INR", "APTT" in the last 8760 hours.  BMP: Recent Labs    08/23/22 0000 10/22/22 1045  NA 140 138  K 4.3 3.9  CL 101 98  CO2 31*  --   GLUCOSE  --  97  BUN 19 20  CALCIUM 8.9  --   CREATININE 0.6 0.70    LIVER FUNCTION TESTS: Recent Labs    08/23/22 0000  AST 15  ALT 15  ALBUMIN 3.5    Assessment and Plan: 82 year old female with history of left knee pain (WOMAC 57/96) secondary to osteoarthritis (K&G grade 3) with lifestyle limiting pain and discomfort refractory to conservative measures including sustained release  steroid injections and hyaluronic acid injections.  She is not an operative candidate due to elevated BMI; s/p consultation with Dr. Elby Showers on 02/05/2023 to discuss treatment options for left knee pain and deemed an appropriate candidate for left geniculate artery embolization.  She presents today for the procedure.Risks and benefits of procedure were discussed with the patient including, but not limited to bleeding, infection, vascular injury or contrast induced renal failure.  This interventional procedure involves the use of X-rays and because of the nature of the planned procedure, it is possible that we will have prolonged use of X-ray fluoroscopy.  Potential radiation  risks to you include (but are not limited to) the following: - A slightly elevated risk for cancer  several years later in life. This risk is typically less than 0.5% percent. This risk is low in comparison to the normal incidence of human cancer, which is 33% for women and 50% for men according to the American Cancer Society. - Radiation induced injury can include skin redness, resembling a rash, tissue breakdown / ulcers and hair loss (which can be temporary or permanent).   The likelihood of either of these occurring depends on the difficulty of the procedure and whether you are sensitive to radiation due to previous procedures, disease, or genetic conditions.   IF your procedure requires a prolonged use of radiation, you will be notified and given written instructions for further action.  It is your responsibility to monitor the irradiated area for the 2 weeks following the procedure and to notify your physician if you are concerned that you have suffered a radiation induced injury.    All of the patient's questions were answered, patient is agreeable to proceed.  Consent signed and in chart.   LABS PENDING   Electronically Signed: D. Jeananne Rama, PA-C 03/17/2023, 5:20 PM   I spent a total of 25 minutes at the the  patient's bedside AND on the patient's hospital floor or unit, greater than 50% of which was counseling/coordinating care for left geniculate artery embolization

## 2023-03-18 ENCOUNTER — Encounter (HOSPITAL_COMMUNITY): Payer: Self-pay

## 2023-03-18 ENCOUNTER — Other Ambulatory Visit: Payer: Self-pay

## 2023-03-18 ENCOUNTER — Other Ambulatory Visit (HOSPITAL_COMMUNITY): Payer: Self-pay | Admitting: Interventional Radiology

## 2023-03-18 ENCOUNTER — Ambulatory Visit (HOSPITAL_COMMUNITY)
Admission: RE | Admit: 2023-03-18 | Discharge: 2023-03-18 | Disposition: A | Discharge status: Home / Self Care | Payer: Medicare HMO | Source: Ambulatory Visit | Attending: Interventional Radiology | Admitting: Interventional Radiology

## 2023-03-18 DIAGNOSIS — N189 Chronic kidney disease, unspecified: Secondary | ICD-10-CM | POA: Insufficient documentation

## 2023-03-18 DIAGNOSIS — Z8542 Personal history of malignant neoplasm of other parts of uterus: Secondary | ICD-10-CM | POA: Insufficient documentation

## 2023-03-18 DIAGNOSIS — Z923 Personal history of irradiation: Secondary | ICD-10-CM | POA: Insufficient documentation

## 2023-03-18 DIAGNOSIS — Z853 Personal history of malignant neoplasm of breast: Secondary | ICD-10-CM | POA: Insufficient documentation

## 2023-03-18 DIAGNOSIS — M1712 Unilateral primary osteoarthritis, left knee: Secondary | ICD-10-CM | POA: Diagnosis not present

## 2023-03-18 DIAGNOSIS — I129 Hypertensive chronic kidney disease with stage 1 through stage 4 chronic kidney disease, or unspecified chronic kidney disease: Secondary | ICD-10-CM | POA: Insufficient documentation

## 2023-03-18 DIAGNOSIS — F419 Anxiety disorder, unspecified: Secondary | ICD-10-CM | POA: Insufficient documentation

## 2023-03-18 DIAGNOSIS — Z96651 Presence of right artificial knee joint: Secondary | ICD-10-CM | POA: Diagnosis not present

## 2023-03-18 DIAGNOSIS — M25562 Pain in left knee: Secondary | ICD-10-CM

## 2023-03-18 HISTORY — PX: IR EMBO ARTERIAL NOT HEMORR HEMANG INC GUIDE ROADMAPPING: IMG5448

## 2023-03-18 HISTORY — PX: IR US GUIDE VASC ACCESS RIGHT: IMG2390

## 2023-03-18 HISTORY — PX: IR EMBO TUMOR ORGAN ISCHEMIA INFARCT INC GUIDE ROADMAPPING: IMG5449

## 2023-03-18 HISTORY — PX: IR ANGIOGRAM SELECTIVE EACH ADDITIONAL VESSEL: IMG667

## 2023-03-18 HISTORY — PX: IR ANGIOGRAM EXTREMITY RIGHT: IMG652

## 2023-03-18 LAB — CBC WITH DIFFERENTIAL/PLATELET
Abs Immature Granulocytes: 0.02 10*3/uL (ref 0.00–0.07)
Basophils Absolute: 0 10*3/uL (ref 0.0–0.1)
Basophils Relative: 0 %
Eosinophils Absolute: 0.1 10*3/uL (ref 0.0–0.5)
Eosinophils Relative: 2 %
HCT: 38.1 % (ref 36.0–46.0)
Hemoglobin: 12.1 g/dL (ref 12.0–15.0)
Immature Granulocytes: 0 %
Lymphocytes Relative: 26 %
Lymphs Abs: 1.9 10*3/uL (ref 0.7–4.0)
MCH: 28.2 pg (ref 26.0–34.0)
MCHC: 31.8 g/dL (ref 30.0–36.0)
MCV: 88.8 fL (ref 80.0–100.0)
Monocytes Absolute: 0.6 10*3/uL (ref 0.1–1.0)
Monocytes Relative: 7 %
Neutro Abs: 4.8 10*3/uL (ref 1.7–7.7)
Neutrophils Relative %: 65 %
Platelets: 279 10*3/uL (ref 150–400)
RBC: 4.29 MIL/uL (ref 3.87–5.11)
RDW: 13.4 % (ref 11.5–15.5)
WBC: 7.5 10*3/uL (ref 4.0–10.5)
nRBC: 0 % (ref 0.0–0.2)

## 2023-03-18 LAB — BASIC METABOLIC PANEL
Anion gap: 8 (ref 5–15)
BUN: 24 mg/dL — ABNORMAL HIGH (ref 8–23)
CO2: 29 mmol/L (ref 22–32)
Calcium: 8.6 mg/dL — ABNORMAL LOW (ref 8.9–10.3)
Chloride: 101 mmol/L (ref 98–111)
Creatinine, Ser: 0.58 mg/dL (ref 0.44–1.00)
GFR, Estimated: >60 mL/min (ref >60)
Glucose, Bld: 102 mg/dL — ABNORMAL HIGH (ref 70–99)
Potassium: 3.7 mmol/L (ref 3.5–5.1)
Sodium: 138 mmol/L (ref 135–145)

## 2023-03-18 LAB — PROTIME-INR
INR: 1 (ref 0.8–1.2)
Prothrombin Time: 13.8 seconds (ref 11.4–15.2)

## 2023-03-18 MED ORDER — ONDANSETRON HCL 4 MG/2ML IJ SOLN
INTRAMUSCULAR | Status: AC
  Filled 2023-03-18: qty 2

## 2023-03-18 MED ORDER — IODIXANOL 320 MG/ML IV SOLN
50.0000 mL | Freq: Once | INTRAVENOUS | Status: AC | PRN
  Administered 2023-03-18: 2 mL via INTRA_ARTERIAL

## 2023-03-18 MED ORDER — METHYLPREDNISOLONE 4 MG PO TBPK: ORAL_TABLET | ORAL | 0 refills | Status: DC

## 2023-03-18 MED ORDER — IODIXANOL 320 MG/ML IV SOLN
50.0000 mL | Freq: Once | INTRAVENOUS | Status: AC | PRN
  Administered 2023-03-18: 2 mL via INTRAVENOUS

## 2023-03-18 MED ORDER — MIDAZOLAM HCL 2 MG/2ML IJ SOLN
INTRAMUSCULAR | Status: AC
  Filled 2023-03-18: qty 4

## 2023-03-18 MED ORDER — ONDANSETRON HCL 4 MG/2ML IJ SOLN
INTRAMUSCULAR | Status: AC | PRN
Start: 2023-03-18 — End: 2023-03-18
  Administered 2023-03-18: 4 mg via INTRAVENOUS

## 2023-03-18 MED ORDER — ACETAMINOPHEN 10 MG/ML IV SOLN
1000.0000 mg | Freq: Once | INTRAVENOUS | Status: AC
  Administered 2023-03-18: 1000 mg via INTRAVENOUS
  Filled 2023-03-18: qty 100

## 2023-03-18 MED ORDER — FENTANYL CITRATE (PF) 100 MCG/2ML IJ SOLN
INTRAMUSCULAR | Status: AC | PRN
  Administered 2023-03-18 (×2): 50 ug via INTRAVENOUS

## 2023-03-18 MED ORDER — SODIUM CHLORIDE 0.9 % IV SOLN: INTRAVENOUS | Status: DC

## 2023-03-18 MED ORDER — FENTANYL CITRATE (PF) 100 MCG/2ML IJ SOLN
INTRAMUSCULAR | Status: AC
  Filled 2023-03-18: qty 2

## 2023-03-18 MED ORDER — KETOROLAC TROMETHAMINE 30 MG/ML IJ SOLN
30.0000 mg | INTRAMUSCULAR | Status: AC
  Administered 2023-03-18: 30 mg via INTRAVENOUS
  Filled 2023-03-18: qty 1

## 2023-03-18 MED ORDER — NITROGLYCERIN IN D5W 100-5 MCG/ML-% IV SOLN
INTRAVENOUS | Status: AC
  Filled 2023-03-18: qty 250

## 2023-03-18 MED ORDER — DEXAMETHASONE SODIUM PHOSPHATE 10 MG/ML IJ SOLN
20.0000 mg | Freq: Once | INTRAMUSCULAR | Status: AC
  Administered 2023-03-18: 20 mg via INTRAVENOUS
  Filled 2023-03-18: qty 2

## 2023-03-18 MED ORDER — NITROGLYCERIN 1 MG/10 ML FOR IR/CATH LAB: 100.0000 ug | Freq: Once | INTRA_ARTERIAL | Status: DC

## 2023-03-18 MED ORDER — LIDOCAINE HCL (PF) 1 % IJ SOLN
20.0000 mL | Freq: Once | INTRAMUSCULAR | Status: AC
  Administered 2023-03-18: 5 mL via INTRADERMAL

## 2023-03-18 MED ORDER — NITROGLYCERIN 1 MG/10 ML FOR IR/CATH LAB
INTRA_ARTERIAL | Status: AC | PRN
  Administered 2023-03-18: 100 ug via INTRA_ARTERIAL

## 2023-03-18 MED ORDER — DIPHENHYDRAMINE HCL 50 MG/ML IJ SOLN
INTRAMUSCULAR | Status: AC | PRN
  Administered 2023-03-18 (×2): 12.5 mg via INTRAVENOUS

## 2023-03-18 MED ORDER — NAPROXEN 500 MG PO TABS: 500.0000 mg | ORAL_TABLET | Freq: Two times a day (BID) | ORAL | 0 refills | Status: DC

## 2023-03-18 MED ORDER — DIPHENHYDRAMINE HCL 50 MG/ML IJ SOLN
INTRAMUSCULAR | Status: AC
  Filled 2023-03-18: qty 1

## 2023-03-18 MED ORDER — IODIXANOL 320 MG/ML IV SOLN
50.0000 mL | Freq: Once | INTRAVENOUS | Status: AC | PRN
  Administered 2023-03-18: 5 mL via INTRA_ARTERIAL

## 2023-03-18 MED ORDER — MIDAZOLAM HCL 2 MG/2ML IJ SOLN
INTRAMUSCULAR | Status: AC | PRN
  Administered 2023-03-18 (×2): 1 mg via INTRAVENOUS

## 2023-03-18 MED ORDER — LIDOCAINE HCL 1 % IJ SOLN
INTRAMUSCULAR | Status: AC
  Filled 2023-03-18: qty 20

## 2023-03-18 NOTE — Procedures (Signed)
Interventional Radiology Procedure Note  Procedure:  1) Limited left lower extremity angiogram 2) Left superior medial geniculate artery embolization  Findings: Please refer to procedural dictation for full description. Right CFA 6 Fr Angioseal closure.  Complications: None immediate  Estimated Blood Loss: < 5 mL  Recommendations: 1.5 hr bedrest. Naproxen and medrol dosepak for 5 days. Follow up in IR in 1 month.   Marliss Coots, MD

## 2023-03-18 NOTE — Progress Notes (Signed)
TeleHealth Visit:  This visit was completed with telemedicine (audio/video) technology. Kristi Hunter has verbally consented to this TeleHealth visit. The patient is located at home, the provider is located at home. The participants in this visit include the listed provider and patient. The visit was conducted today via MyChart video.  OBESITY Kristi Hunter is here to discuss her progress with her obesity treatment plan along with follow-up of her obesity related diagnoses.   Today's visit was # 36 Starting weight: 267 lbs Starting date: 02/22/20 Weight at last in office visit: 257 lbs on 02/11/23 Total weight loss: 10 lbs at last in office visit on 02/11/23. Today's reported weight (03/19/23):  258 lbs  Nutrition Plan: the Category 2 plan   Current exercise: water aerobics 45 minutes twice weekly  Interim History:  She has a knee procedure yesterday and is doing well. Lives at [place]. Has dinner at [facility] and occasionally lunch. Follows cat 2 in spirit but not to the letter.  Food today: Breakfast: White omelet with veggies for breakfast. Lunch will be Eggplant parmesan (leftover)  She says her biggest problem is a sweet tooth. She tries not to keep sweets at home. She eats Twizzlers and Tootsie rolls when she travels.  Has a trip planned for September. Denies polyphagia. Likely inadequate protein intake.  Assessment/Plan:  1. Prediabetes Last A1c was 5.8.  Medication(s): metformin 1000 mg at breakfast Polyphagia:No. Positive for sweets cravings. Lab Results  Component Value Date   HGBA1C 5.8 08/23/2022   HGBA1C 5.9 (H) 02/20/2022   HGBA1C 5.8 (H) 12/25/2021   HGBA1C 5.7 (H) 04/12/2021   HGBA1C 5.8 (H) 02/19/2021   Lab Results  Component Value Date   INSULIN 22.5 02/20/2022   INSULIN 37.9 (H) 04/12/2021   INSULIN 21.5 02/22/2020    Plan: Continue and refill metformin 1000 mg at breakfast. Encouraged her to avoid keeping sweets at home. Discussed limiting  intake of tweezers and Tootsie Roll's when traveling.  2.  Bilateral edema of lower extremity Notes SOB with exertion.  Positive for orthopnea.  Sleeps with head elevated.  Recently saw cardiology.  Echocardiogram has been scheduled. Her mother had congestive heart failure. She so has leg pumps for lower extremity edema. She is prescribed Lasix 20 mg daily but takes it as needed.  Takes chlorthalidone 25 mg daily.  Plan: Have echocardiogram scheduled. Follow-up with cardiology.  3. Osteoarthritis left knee Chronic left knee pain secondary to OA. Had right superior medial geniculate artery embolization yesterday for pain relief. She is doing well. Has had right TKA. BMI is too high for left TKR.  Plan: FU with ortho as directed. She was told she may resume exercise as tolerated.  4. Morbid Obesity: Current BMI 45  Kristi Hunter is currently in the action stage of change. As such, her goal is to continue with weight loss efforts.  She has agreed to the Category 2 plan.  1.  Encouraged her to weigh her protein for sandwich at lunch.  She was not provided a food scale when she started with [name].  I told her to ask for 1 at next visit. 2.  Talked about the best protein choices for sandwiches-turkey, chicken, roast beef.  She likes liver where sandwiches-I advised this is not a good choice..  Exercise goals: As tolerated and recommended by Ortho.  Behavioral modification strategies: increasing lean protein intake, decreasing simple carbohydrates , meal planning , better snacking choices, planning for success, weigh protein portions, and measure portion sizes.  Kristi Hunter has agreed to  follow-up with our clinic in 4 weeks.  No orders of the defined types were placed in this encounter.   Medications Discontinued During This Encounter  Medication Reason   metFORMIN (GLUCOPHAGE) 500 MG tablet Reorder     Meds ordered this encounter  Medications   metFORMIN (GLUCOPHAGE) 500 MG tablet     Sig: Take 2 tablets (1,000 mg total) by mouth daily with breakfast.    Dispense:  60 tablet    Refill:  0    Order Specific Question:   Supervising Provider    Answer:   Kristi Hunter [[id number]]      Objective:   VITALS: Per patient if applicable, see vitals. GENERAL: Alert and in no acute distress. CARDIOPULMONARY: No increased WOB. Speaking in clear sentences.  PSYCH: Pleasant and cooperative. Speech normal rate and rhythm. Affect is appropriate. Insight and judgement are appropriate. Attention is focused, linear, and appropriate.  NEURO: Oriented as arrived to appointment on time with no prompting.   Attestation Statements:   Reviewed by clinician on day of visit: allergies, medications, problem list, medical history, surgical history, family history, social history, and previous encounter notes.  This was prepared with the assistance of Engineer, civil (consulting).  Occasional wrong-word or sound-a-like substitutions may have occurred due to the inherent limitations of voice recognition software.

## 2023-03-19 ENCOUNTER — Telehealth (INDEPENDENT_AMBULATORY_CARE_PROVIDER_SITE_OTHER): Payer: Medicare HMO | Admitting: Family Medicine

## 2023-03-19 ENCOUNTER — Encounter (INDEPENDENT_AMBULATORY_CARE_PROVIDER_SITE_OTHER): Payer: Self-pay | Admitting: Family Medicine

## 2023-03-19 DIAGNOSIS — R7303 Prediabetes: Secondary | ICD-10-CM | POA: Diagnosis not present

## 2023-03-19 DIAGNOSIS — Z6841 Body Mass Index (BMI) 40.0 and over, adult: Secondary | ICD-10-CM

## 2023-03-19 DIAGNOSIS — M1712 Unilateral primary osteoarthritis, left knee: Secondary | ICD-10-CM | POA: Diagnosis not present

## 2023-03-19 DIAGNOSIS — R600 Localized edema: Secondary | ICD-10-CM | POA: Diagnosis not present

## 2023-03-19 MED ORDER — METFORMIN HCL 500 MG PO TABS: 1000.0000 mg | ORAL_TABLET | Freq: Every day | ORAL | 0 refills | Status: DC

## 2023-03-27 ENCOUNTER — Ambulatory Visit (HOSPITAL_BASED_OUTPATIENT_CLINIC_OR_DEPARTMENT_OTHER): Payer: Medicare HMO

## 2023-04-03 ENCOUNTER — Ambulatory Visit (HOSPITAL_BASED_OUTPATIENT_CLINIC_OR_DEPARTMENT_OTHER)
Admission: RE | Admit: 2023-04-03 | Discharge: 2023-04-03 | Disposition: A | Discharge status: Home / Self Care | Payer: Medicare HMO | Source: Ambulatory Visit | Attending: Internal Medicine | Admitting: Internal Medicine

## 2023-04-03 DIAGNOSIS — R600 Localized edema: Secondary | ICD-10-CM | POA: Diagnosis not present

## 2023-04-03 LAB — ECHOCARDIOGRAM COMPLETE
Area-P 1/2: 2.93 cm2
S' Lateral: 2.8 cm

## 2023-04-10 ENCOUNTER — Telehealth: Payer: Self-pay | Admitting: Internal Medicine

## 2023-04-10 DIAGNOSIS — R600 Localized edema: Secondary | ICD-10-CM

## 2023-04-10 MED ORDER — FUROSEMIDE 20 MG PO TABS
20.0000 mg | ORAL_TABLET | Freq: Every day | ORAL | 3 refills | Status: DC
Start: 2023-04-10 — End: 2024-05-06

## 2023-04-10 NOTE — Telephone Encounter (Signed)
*  STAT* If patient is at the pharmacy, call can be transferred to refill team.   1. Which medications need to be refilled? (please list name of each medication and dose if known) furosemide (LASIX) 20 MG tablet  2. Which pharmacy/location (including street and city if local pharmacy) is medication to be sent to? [id number] - [place], [place] - [address]  3. Do they need a 30 day or 90 day supply? 90 day   

## 2023-04-10 NOTE — Telephone Encounter (Signed)
Pt's medication was sent to pt's pharmacy as requested. Confirmation received.  °

## 2023-04-15 DIAGNOSIS — J329 Chronic sinusitis, unspecified: Secondary | ICD-10-CM | POA: Diagnosis not present

## 2023-04-15 DIAGNOSIS — R0602 Shortness of breath: Secondary | ICD-10-CM | POA: Diagnosis not present

## 2023-04-16 ENCOUNTER — Other Ambulatory Visit (HOSPITAL_BASED_OUTPATIENT_CLINIC_OR_DEPARTMENT_OTHER): Payer: Self-pay | Admitting: Family Medicine

## 2023-04-16 ENCOUNTER — Ambulatory Visit (HOSPITAL_BASED_OUTPATIENT_CLINIC_OR_DEPARTMENT_OTHER)
Admission: RE | Admit: 2023-04-16 | Discharge: 2023-04-16 | Disposition: A | Discharge status: Home / Self Care | Payer: Medicare HMO | Source: Ambulatory Visit | Attending: Family Medicine | Admitting: Family Medicine

## 2023-04-16 DIAGNOSIS — I700 Atherosclerosis of aorta: Secondary | ICD-10-CM | POA: Diagnosis not present

## 2023-04-16 DIAGNOSIS — R059 Cough, unspecified: Secondary | ICD-10-CM | POA: Insufficient documentation

## 2023-04-17 ENCOUNTER — Ambulatory Visit (INDEPENDENT_AMBULATORY_CARE_PROVIDER_SITE_OTHER): Payer: Medicare HMO | Admitting: Family Medicine

## 2023-04-17 ENCOUNTER — Encounter (INDEPENDENT_AMBULATORY_CARE_PROVIDER_SITE_OTHER): Payer: Self-pay | Admitting: Family Medicine

## 2023-04-17 VITALS — BP 132/78 | HR 89 | Temp 97.6°F | Ht 63.0 in | Wt 256.0 lb

## 2023-04-17 DIAGNOSIS — R7303 Prediabetes: Secondary | ICD-10-CM

## 2023-04-17 DIAGNOSIS — E669 Obesity, unspecified: Secondary | ICD-10-CM

## 2023-04-17 DIAGNOSIS — J3089 Other allergic rhinitis: Secondary | ICD-10-CM | POA: Diagnosis not present

## 2023-04-17 DIAGNOSIS — Z6841 Body Mass Index (BMI) 40.0 and over, adult: Secondary | ICD-10-CM | POA: Diagnosis not present

## 2023-04-17 MED ORDER — FLUTICASONE PROPIONATE 50 MCG/ACT NA SUSP
2.0000 | Freq: Two times a day (BID) | NASAL | 6 refills | Status: AC
Start: 2023-04-17 — End: ?

## 2023-04-17 MED ORDER — METFORMIN HCL 1000 MG PO TABS
1000.0000 mg | ORAL_TABLET | Freq: Two times a day (BID) | ORAL | 0 refills | Status: DC
Start: 2023-04-17 — End: 2023-05-20

## 2023-04-17 NOTE — Progress Notes (Signed)
Chief Complaint:   OBESITY Kristi Hunter is here to discuss her progress with her obesity treatment plan along with follow-up of her obesity related diagnoses. Kristi Hunter is on the Category 2 Plan and states she is following her eating plan approximately 65-70% of the time. Kristi Hunter states she is doing upper body aerobics and pool strengthening for 45 minutes 2 times per week.  Today's visit was #: 37 Starting weight: 267 lbs Starting date: 02/22/2020 Today's weight: 256 lbs Today's date: 04/17/2023 Total lbs lost to date: 11 Total lbs lost since last in-office visit: 1  Interim History: Patient has done well with her weight loss despite extra challenges.  She had a procedure on her knee and her pain has improved so she is staying more active.  Subjective:   1. Pre-diabetes Patient is on metformin and she would like to change to twice daily.  She has a normal GFR.  2. Seasonal allergic rhinitis due to other allergic trigger Patient notes increased allergies this fall.  She has done well with Flonase in the past.  Assessment/Plan:   1. Pre-diabetes Patient agreed to increase metformin to 1,000 mg twice daily, and we will refill for 1 month.   - metFORMIN (GLUCOPHAGE) 1000 MG tablet; Take 1 tablet (1,000 mg total) by mouth 2 (two) times daily with a meal.  Dispense: 30 tablet; Refill: 0  2. Seasonal allergic rhinitis due to other allergic trigger Patient agreed to start Flonase 50 mcg 2 sprays twice daily with no refills. We will follow-up at her next visit in 1 month.   3. BMI 45.0-49.9, adult (HCC)  4. Obesity, Beginning BMI 48.83 Kristi Hunter is currently in the action stage of change. As such, her goal is to continue with weight loss efforts. She has agreed to change to following a lower carbohydrate, vegetable and lean protein rich diet plan.   Exercise goals: As is.   Behavioral modification strategies: increasing lean protein intake.  Kristi Hunter has agreed to follow-up  with our clinic in 4 to 5 weeks. She was informed of the importance of frequent follow-up visits to maximize her success with intensive lifestyle modifications for her multiple health conditions.   Objective:   Blood pressure 132/78, pulse 89, temperature 97.6 F (36.4 C), height 5\' 3"  (1.6 m), weight 256 lb (116.1 kg), SpO2 93%. Body mass index is 45.35 kg/m.  Lab Results  Component Value Date   CREATININE 0.58 03/18/2023   BUN 24 (H) 03/18/2023   NA 138 03/18/2023   K 3.7 03/18/2023   CL 101 03/18/2023   CO2 29 03/18/2023   Lab Results  Component Value Date   ALT 15 08/23/2022   AST 15 08/23/2022   ALKPHOS 43 03/15/2022   BILITOT 0.3 03/15/2022   Lab Results  Component Value Date   HGBA1C 5.8 08/23/2022   HGBA1C 5.9 (H) 02/20/2022   HGBA1C 5.8 (H) 12/25/2021   HGBA1C 5.7 (H) 04/12/2021   HGBA1C 5.8 (H) 02/19/2021   Lab Results  Component Value Date   INSULIN 22.5 02/20/2022   INSULIN 37.9 (H) 04/12/2021   INSULIN 21.5 02/22/2020   Lab Results  Component Value Date   TSH 3.10 08/23/2022   Lab Results  Component Value Date   CHOL 161 08/23/2022   HDL 64 08/23/2022   LDLCALC 84 08/23/2022   TRIG 58 08/23/2022   CHOLHDL 3 01/13/2019   Lab Results  Component Value Date   VD25OH 32.7 02/20/2022   VD25OH 35.1 12/25/2021   VD25OH  46.8 04/12/2021   Lab Results  Component Value Date   WBC 7.5 03/18/2023   HGB 12.1 03/18/2023   HCT 38.1 03/18/2023   MCV 88.8 03/18/2023   PLT 279 03/18/2023   No results found for: "IRON", "TIBC", "FERRITIN"  Attestation Statements:   Reviewed by clinician on day of visit: allergies, medications, problem list, medical history, surgical history, family history, social history, and previous encounter notes.   I, Burt Knack, am acting as transcriptionist for Quillian Quince, MD.  I have reviewed the above documentation for accuracy and completeness, and I agree with the above. -  Quillian Quince, MD

## 2023-04-18 ENCOUNTER — Ambulatory Visit
Admission: RE | Admit: 2023-04-18 | Discharge: 2023-04-18 | Disposition: A | Discharge status: Home / Self Care | Payer: Medicare HMO | Source: Ambulatory Visit | Attending: Interventional Radiology

## 2023-04-18 DIAGNOSIS — M1712 Unilateral primary osteoarthritis, left knee: Secondary | ICD-10-CM | POA: Diagnosis not present

## 2023-04-18 DIAGNOSIS — M25562 Pain in left knee: Secondary | ICD-10-CM | POA: Diagnosis not present

## 2023-04-18 HISTORY — PX: IR RADIOLOGIST EVAL & MGMT: IMG5224

## 2023-04-18 NOTE — Progress Notes (Signed)
Referring Physician(s): Clementeen Graham, MD  Reason for followup:  1 month after left GAE  History of present illness: Initial HPI from 02/05/23 Kristi Hunter is a 82 y.o. female with a medical history significant for HTN, CKD, anxiety, uterine cancer, breast cancer s/p lumpectomy and adjuvant radiotherapy (2022) and right total knee replacement.  She also has a history of left knee pain and has been followed by Dr. [name] since 2021. She has received physical therapy and numerous steroid injections as well as the Orthovisc series in attempts to alleviate her knee pain. Steroid injections have provides temporary relief and her last injection was December 2023. Earlier this year she developed worsening posterior knee pain and swelling. MR knee 01/02/23 showed a dominant finding of advanced tricompartmental osteoarthritis which is the likely culprit of her knee pain.    She is not an ideal candidate for a knee replacement due to her BMI of 46.  She has been referred to our services for evaluation of geniculate artery embolization.   She complains primarily of left knee pain and additional muscular pain in the left calf.  She thought she had a Baker cyst but this was ruled out on MRI.  She takes naproxen and tylenol occasionally, with minimal relief.  She states her primary functional limitation is getting up and down out of chairs, bed, an off the toilet.  She does water aerobics and upper body weights at her independent living facility several times per week.  She has been unsuccessful and losing weight.   Womac Pain Score:  57/96 VAS Pain Score: 6/10  She presents today via virtual telephone clinic visit.  She underwent technically successful left superior medial geniculate artery embolization at [facility] on 03/18/23 via right CFA access.    Able to get in and out of car without screaming.  Now able to bend her left leg without severe pain.  Has had slight increased pain in her lower left leg and  some calf muscle cramping.  Had some minor skin changes like bruising at the left medial knee, but that resolved.  She is still doing water aerobics which along with walking, is much easier now.  She is taking over the counter pain medications no more than once a week, previously it was daily.      Womac Pain Score: 30/96 VAS Pain Score: 1/10   Past Medical History:  Diagnosis Date   Anemia    Anxiety    Arthritis    several different parts of the body per pt 10/17/2022   Back pain    Breast cancer (HCC)    02/20/21   Cancer (HCC)    uterine   Chest pain    Chicken pox    Chronic kidney disease    Constipation    Emphysema of lung (HCC)    Family history of breast cancer 02/14/2021   Glaucoma    Hypertension    Joint pain    Lower extremity edema    Lymphedema    Osteoarthritis    Personal history of malignant neoplasm of uterus 02/14/2021   Pre-diabetes    Prediabetes    Shortness of breath    Sleep apnea    doesn't use CPAP machine anymore   Urine incontinence    Uterine cancer (HCC)    2011   UTI (urinary tract infection)    Vertigo    had vertigo last week. 10/17/2022   Vitamin B 12 deficiency    during pregnancy  Past Surgical History:  Procedure Laterality Date   ABDOMINAL HYSTERECTOMY  2011   BOTOX INJECTION N/A 10/22/2022   Procedure: BOTOX INJECTION 100 UNITS;  Surgeon: Noel Christmas, MD;  Location: [facility];  Service: Urology;  Laterality: N/A;   BREAST LUMPECTOMY WITH RADIOACTIVE SEED LOCALIZATION Right 02/20/2021   Procedure: RIGHT BREAST LUMPECTOMY WITH RADIOACTIVE SEED LOCALIZATION;  Surgeon: Almond Lint, MD;  Location: [facility] OR;  Service: General;  Laterality: Right;   CYSTOSCOPY WITH INJECTION N/A 10/22/2022   Procedure: CYSTOSCOPY WITH INJECTION OF Kristi Hunter;  Surgeon: Noel Christmas, MD;  Location: [facility] [place];  Service: Urology;  Laterality: N/A;   IR ANGIOGRAM EXTREMITY RIGHT  03/18/2023   IR ANGIOGRAM  SELECTIVE EACH ADDITIONAL VESSEL  03/18/2023   IR ANGIOGRAM SELECTIVE EACH ADDITIONAL VESSEL  03/18/2023   IR EMBO TUMOR ORGAN ISCHEMIA INFARCT INC GUIDE ROADMAPPING  03/18/2023   IR RADIOLOGIST EVAL & MGMT  02/05/2023   IR US GUIDE VASC ACCESS RIGHT  03/18/2023   REPLACEMENT TOTAL KNEE Right 2012    Allergies: Amoxicillin and Percocet [oxycodone-acetaminophen]  Medications: Prior to Admission medications   Medication Sig Start Date End Date Taking? Authorizing Provider  acetaminophen (TYLENOL) 650 MG CR tablet Take 650 mg by mouth every 8 (eight) hours as needed for pain. Takes usually once a day    [provider]  alendronate (FOSAMAX) 70 MG tablet TAKE 1 TABLET (70 MG TOTAL) BY MOUTH EVERY 7 (SEVEN) DAYS. TAKE WITH A FULL GLASS OF WATER ON AN EMPTY STOMACH. 04/04/22   Mliss Sax, MD  calcium carbonate (OS-CAL) 600 MG TABS tablet Take 1 tablet (600 mg total) by mouth 2 (two) times daily with a meal. Patient taking differently: Take 600 mg by mouth as needed. 05/10/21   Mliss Sax, MD  chlorthalidone (HYGROTON) 25 MG tablet Take 1 tablet (25 mg total) by mouth daily. 03/15/22   Mliss Sax, MD  diclofenac Sodium (VOLTAREN) 1 % GEL Apply 2 g topically daily as needed (Osteoarthritis).    [provider]  fluticasone (FLONASE) 50 MCG/ACT nasal spray Place 2 sprays into both nostrils in the morning and at bedtime. 04/17/23   Quillian Quince D, MD  furosemide (LASIX) 20 MG tablet Take 1 tablet (20 mg total) by mouth daily. 04/10/23   Maisie Fus, MD  Melatonin 10 MG TABS Take 10 mg by mouth at bedtime as needed (sleep).    [provider]  metFORMIN (GLUCOPHAGE) 1000 MG tablet Take 1 tablet (1,000 mg total) by mouth 2 (two) times daily with a meal. 04/17/23   Dalbert Garnet, Caren D, MD  metroNIDAZOLE (METROGEL) 1 % gel Apply topically daily. Patient taking differently: Apply 1 application  topically daily as needed (rosacea). 01/29/21   Mliss Sax, MD  Multiple Vitamin (MULTIVITAMIN) tablet Take 1 tablet by mouth daily.    [provider]  naproxen (NAPROSYN) 500 MG tablet Take 1 tablet (500 mg total) by mouth 2 (two) times daily with a meal. 03/18/23   Allred, Darrell K, PA-C  tamoxifen (NOLVADEX) 20 MG tablet Take 1 tablet (20 mg total) by mouth daily. 05/27/22   [name] Moulds, MD     Family History  Problem Relation Age of Onset   Heart attack Mother    Heart disease Mother    Hyperlipidemia Mother    Hypertension Mother    Stroke Mother    Miscarriages / [place] Mother    Diabetes Mother  Thyroid disease Mother    Cancer Mother    Obesity Mother    Melanoma Mother        dx after 62, sun-exposed areas   Cancer Father        skin   Alcohol abuse Father    Asthma Father    COPD Father    Heart attack Father    Heart disease Father    Obesity Father    Melanoma Father        dx after 47, back   Alcohol abuse Brother    Arthritis Brother    Drug abuse Brother    Alcohol abuse Brother    Cancer Brother        ? esophageal, d. 42   Breast cancer Paternal Aunt 69   Arthritis Maternal Grandmother    Diabetes Maternal Grandmother    Hyperlipidemia Maternal Grandmother    Colon cancer Maternal Grandfather        dx 87, dx 23   Alcohol abuse Paternal Grandfather    Arthritis Paternal Grandfather    Cirrhosis Paternal Grandfather    Depression Daughter    Hypertension Daughter    Mental illness Daughter    Colon polyps Daughter    Depression Son     Social History   Socioeconomic History   Marital status: Widowed    Spouse name: Not on file   Number of children: 3   Years of education: Not on file   Highest education level: Not on file  Occupational History   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    Drug use: Never   Sexual activity: Not on file  Other Topics Concern   Not on file  Social History Narrative   Not on file   Social Determinants of Health   Financial Resource Strain: Low Risk  (10/04/2021)   Overall Financial Resource Strain (CARDIA)    Difficulty of Paying Living Expenses: Not hard at all  Food Insecurity: No Food Insecurity (08/17/2021)   Hunger Vital Sign    Worried About Running Out of Food in the Last Year: Never true    Ran Out of Food in the Last Year: Never true  Transportation Needs: No Transportation Needs (08/17/2021)   PRAPARE - Administrator, Civil Service (Medical): No    Lack of Transportation (Non-Medical): No  Physical Activity: Inactive (08/17/2021)   Exercise Vital Sign    Days of Exercise per Week: 0 days    Minutes of Exercise per Session: 0 min  Stress: No Stress Concern Present (08/17/2021)   [employer] of Occupational Health - Occupational Stress Questionnaire    Feeling of Stress : Not at all  Social Connections: Unknown (12/17/2021)   Received from [facility]   Social Network    Social Network: Not on file     Vital Signs: There were no vitals taken for this visit.  No physical examination was performed in  lieu of virtual telephone clinic visit.  Imaging: Left knee 07/26/22  Waldon Reining grade 3 (moderate) osteoarthritis   MRI knee 01/02/23 IMPRESSION: 1. Dominant finding is advanced tricompartmental osteoarthritis. 2. Complex tear posterior horn medial meniscus with an ossific fragment seen just peripheral to the root of the posterior horn. 3. Complex degenerative tearing posterior and anterior horns of the lateral meniscus. 4. Mucoid degeneration of the ACL.    GAE (03/18/23)  Pre-embolization left superior medial geniculate artery   Post embolization (0.3 mL 100-300 micron embospheres)  Labs:  CBC: Recent Labs    08/23/22 0000 10/22/22 1045 03/18/23 0845  WBC 9.2  --  7.5  HGB 13.0 12.9 12.1  HCT 40 38.0 38.1  PLT 350  --  279    COAGS: Recent Labs    03/18/23 0845  INR 1.0    BMP: Recent Labs    08/23/22 0000 10/22/22 1045 03/18/23 0845  NA 140 138 138  K 4.3 3.9 3.7  CL 101 98 101  CO2 31*  --  29  GLUCOSE  --  97 102*  BUN 19 20 24*  CALCIUM 8.9  --  8.6*  CREATININE 0.6 0.70 0.58  GFRNONAA  --   --  >60    LIVER FUNCTION TESTS: Recent Labs    08/23/22 0000  AST 15  ALT 15  ALBUMIN 3.5    Assessment and Plan: 82 year old female with history of left knee pain secondary to osteoarthritis (K&G grade 3) with lifestyle limiting pain and discomfort refractory to conservative measures including sustained release steroid injections and hyaluronic acid injections now status post left superior medial geniculate artery embolization on 03/18/23.  Her WOMAC pain score has improved from 57/96 to 30/96 and her VAS pain score has improved from 6/10 to 1/10.  She is primarily thrilled that her knee no longer hurts when she gets in and out of her car, which was her chief complaint prior to the procedure.  She is now rarely taking pain medication.  She is overall very pleased with the outcome.  Follow up in Interventional Radiology clinic in 6 months.  Electronically Signed: Bennie Dallas 04/18/2023, 7:34 AM   I spent a total of 40 Minutes in virtual telephone clinical consultation, greater than 50% of which was counseling/coordinating care for left knee osteoarthritis.

## 2023-04-23 DIAGNOSIS — F341 Dysthymic disorder: Secondary | ICD-10-CM | POA: Diagnosis not present

## 2023-04-23 DIAGNOSIS — F4389 Other reactions to severe stress: Secondary | ICD-10-CM | POA: Diagnosis not present

## 2023-04-24 ENCOUNTER — Encounter (HOSPITAL_COMMUNITY): Payer: Self-pay | Admitting: Radiology

## 2023-04-24 ENCOUNTER — Other Ambulatory Visit (HOSPITAL_COMMUNITY): Payer: Self-pay | Admitting: Interventional Radiology

## 2023-04-24 DIAGNOSIS — M25562 Pain in left knee: Secondary | ICD-10-CM

## 2023-04-24 DIAGNOSIS — M1712 Unilateral primary osteoarthritis, left knee: Secondary | ICD-10-CM

## 2023-04-26 ENCOUNTER — Other Ambulatory Visit (INDEPENDENT_AMBULATORY_CARE_PROVIDER_SITE_OTHER): Payer: Self-pay | Admitting: Family Medicine

## 2023-04-26 DIAGNOSIS — R7303 Prediabetes: Secondary | ICD-10-CM

## 2023-04-30 ENCOUNTER — Ambulatory Visit: Payer: Medicare HMO | Admitting: Family Medicine

## 2023-04-30 ENCOUNTER — Other Ambulatory Visit: Payer: Self-pay

## 2023-04-30 ENCOUNTER — Ambulatory Visit (INDEPENDENT_AMBULATORY_CARE_PROVIDER_SITE_OTHER): Payer: Medicare HMO

## 2023-04-30 VITALS — BP 148/88 | HR 79 | Ht 63.0 in

## 2023-04-30 DIAGNOSIS — M25551 Pain in right hip: Secondary | ICD-10-CM

## 2023-04-30 DIAGNOSIS — M419 Scoliosis, unspecified: Secondary | ICD-10-CM | POA: Diagnosis not present

## 2023-04-30 DIAGNOSIS — M8580 Other specified disorders of bone density and structure, unspecified site: Secondary | ICD-10-CM | POA: Diagnosis not present

## 2023-04-30 DIAGNOSIS — M545 Low back pain, unspecified: Secondary | ICD-10-CM | POA: Diagnosis not present

## 2023-04-30 DIAGNOSIS — M4319 Spondylolisthesis, multiple sites in spine: Secondary | ICD-10-CM | POA: Diagnosis not present

## 2023-04-30 DIAGNOSIS — M47816 Spondylosis without myelopathy or radiculopathy, lumbar region: Secondary | ICD-10-CM | POA: Diagnosis not present

## 2023-04-30 MED ORDER — TIZANIDINE HCL 2 MG PO TABS
2.0000 mg | ORAL_TABLET | Freq: Three times a day (TID) | ORAL | 1 refills | Status: DC | PRN
Start: 2023-04-30 — End: 2023-05-23

## 2023-04-30 NOTE — Progress Notes (Signed)
Rubin Payor, PhD, LAT, ATC acting as a scribe for Clementeen Graham, MD.  Kristi Hunter is a 82 y.o. female who presents to [facility] at [facility] today for re-occurring low back and hip pain. She lives at [place]. Pt was previously seen by Dr. [name] on 01/17/23 for L knee pain.  Today, pt c/o low back and hip pain x 1-wk. No falls. She was carrying things in and out from the patio. Pt locates pain to along the R hip flexor, lateral R hip, R-side of her low back and into the R buttock. She notes a sciatica flare in 2018.  She notes she did some lifting and moving before the back and hip pain started.  She notes amazing pain relief from artery embolization procedure that we referred her for and she had done on Aug 6th.   Radiating pain: no LE numbness/tingling: no LE weakness:  no Aggravates: any activity Treatments tried: naproxen, heat, biofreeze  Pertinent review of systems: No fevers or chills  Relevant historical information: Obesity and knee pain   Exam:  BP (!) 148/88   Pulse 79   Ht 5\' 3"  (1.6 m)   SpO2 94%   BMI 45.35 kg/m  General: Well Developed, well nourished, and in no acute distress.   MSK: L-spine: Normal appearing Nontender to palpation spinal midline.  Tender palpation right paraspinal musculature. Decreased lumbar motion.  Right hip: Normal-appearing Nontender palpation greater trochanter.  Reasonable hip motion.  Not much pain with flexion and rotation.    Lab and Radiology Results  X-ray images lumbar spine and right hip obtained today personally and independently interpreted  L-spine: Degenerative changes lower portion lumbar spine.  No acute fractures are visible.  Right hip: Right hip DJD.  No acute fractures are visible.  Await formal radiology review   Assessment and Plan: 82 y.o. female with acute right low back pain and right anterior hip pain occurring about a week ago without injury.  Pain most consistent with  muscle spasm and dysfunction.  Plan for trial of physical therapy.  She lives at [facility] so we can use that location.  She has been there previously.  Recommend heating pad and mobility.  Tizanidine prescribed primarily to use at bedtime.  If not improving a bit in 1 week she would like to return and consider injection.  SI joint or intra-articular right hip may be targets.   PDMP not reviewed this encounter. Orders Placed This Encounter  Procedures   DG HIP UNILAT W OR W/O PELVIS 2-3 VIEWS RIGHT    Standing Status:   Future    Number of Occurrences:   1    Standing Expiration Date:   05/30/2023    Order Specific Question:   Reason for Exam (SYMPTOM  OR DIAGNOSIS REQUIRED)    Answer:   right hip pain    Order Specific Question:   Preferred imaging location?    Answer:   [facility]   DG Lumbar Spine 2-3 Views    Standing Status:   Future    Number of Occurrences:   1    Standing Expiration Date:   05/30/2023    Order Specific Question:   Reason for Exam (SYMPTOM  OR DIAGNOSIS REQUIRED)    Answer:   low back pain    Order Specific Question:   Preferred imaging location?    Answer:   [facility]   Ambulatory referral to Physical Therapy  Referral Priority:   Routine    Referral Type:   Physical Medicine    Referral Reason:   Specialty Services Required    Requested Specialty:   Physical Therapy    Number of Visits Requested:   1   Meds ordered this encounter  Medications   tiZANidine (ZANAFLEX) 2 MG tablet    Sig: Take 1-2 tablets (2-4 mg total) by mouth every 8 (eight) hours as needed.    Dispense:  60 tablet    Refill:  1     Discussed warning signs or symptoms. Please see discharge instructions. Patient expresses understanding.   The above documentation has been reviewed and is accurate and complete Clementeen Graham, M.D.

## 2023-04-30 NOTE — Patient Instructions (Signed)
Thank you for coming in today.   Please get an Xray today before you leave   I've referred you to Physical Therapy.  Let us know if you don't hear from them in one week.   Recheck in  6 weeks.   Let me know sooner if this is not working.    OK to use the tizinidine muscle relaxer mostly at bedtime.

## 2023-04-30 NOTE — Progress Notes (Signed)
Lumbar spine x-ray shows no fractures.  You do have some arthritis in your low back. Hip x-ray report is still pending.

## 2023-05-06 DIAGNOSIS — M25551 Pain in right hip: Secondary | ICD-10-CM | POA: Diagnosis not present

## 2023-05-06 DIAGNOSIS — M6281 Muscle weakness (generalized): Secondary | ICD-10-CM | POA: Diagnosis not present

## 2023-05-07 DIAGNOSIS — F4389 Other reactions to severe stress: Secondary | ICD-10-CM | POA: Diagnosis not present

## 2023-05-07 DIAGNOSIS — F341 Dysthymic disorder: Secondary | ICD-10-CM | POA: Diagnosis not present

## 2023-05-08 DIAGNOSIS — M6281 Muscle weakness (generalized): Secondary | ICD-10-CM | POA: Diagnosis not present

## 2023-05-08 DIAGNOSIS — M25551 Pain in right hip: Secondary | ICD-10-CM | POA: Diagnosis not present

## 2023-05-09 ENCOUNTER — Other Ambulatory Visit: Payer: Self-pay | Admitting: Hematology and Oncology

## 2023-05-13 DIAGNOSIS — M25551 Pain in right hip: Secondary | ICD-10-CM | POA: Diagnosis not present

## 2023-05-13 DIAGNOSIS — M6281 Muscle weakness (generalized): Secondary | ICD-10-CM | POA: Diagnosis not present

## 2023-05-14 ENCOUNTER — Telehealth: Payer: Self-pay | Admitting: Hematology and Oncology

## 2023-05-14 NOTE — Telephone Encounter (Signed)
Left patient a message in regards to rescheduled appointment times/dates due to provider being out of office

## 2023-05-15 DIAGNOSIS — M25551 Pain in right hip: Secondary | ICD-10-CM | POA: Diagnosis not present

## 2023-05-15 DIAGNOSIS — M6281 Muscle weakness (generalized): Secondary | ICD-10-CM | POA: Diagnosis not present

## 2023-05-19 NOTE — Progress Notes (Signed)
Left hip x-ray shows some arthritis in both hips.  No broken bones are present.

## 2023-05-20 ENCOUNTER — Ambulatory Visit (INDEPENDENT_AMBULATORY_CARE_PROVIDER_SITE_OTHER): Payer: Medicare HMO | Admitting: Family Medicine

## 2023-05-20 ENCOUNTER — Encounter (INDEPENDENT_AMBULATORY_CARE_PROVIDER_SITE_OTHER): Payer: Self-pay | Admitting: Family Medicine

## 2023-05-20 VITALS — BP 139/80 | HR 89 | Temp 97.7°F | Ht 63.0 in | Wt 258.0 lb

## 2023-05-20 DIAGNOSIS — F5089 Other specified eating disorder: Secondary | ICD-10-CM

## 2023-05-20 DIAGNOSIS — I10 Essential (primary) hypertension: Secondary | ICD-10-CM

## 2023-05-20 DIAGNOSIS — Z6841 Body Mass Index (BMI) 40.0 and over, adult: Secondary | ICD-10-CM | POA: Diagnosis not present

## 2023-05-20 DIAGNOSIS — I5189 Other ill-defined heart diseases: Secondary | ICD-10-CM

## 2023-05-20 DIAGNOSIS — M25551 Pain in right hip: Secondary | ICD-10-CM | POA: Diagnosis not present

## 2023-05-20 DIAGNOSIS — R7303 Prediabetes: Secondary | ICD-10-CM

## 2023-05-20 DIAGNOSIS — E669 Obesity, unspecified: Secondary | ICD-10-CM | POA: Diagnosis not present

## 2023-05-20 DIAGNOSIS — M6281 Muscle weakness (generalized): Secondary | ICD-10-CM | POA: Diagnosis not present

## 2023-05-20 DIAGNOSIS — F3289 Other specified depressive episodes: Secondary | ICD-10-CM

## 2023-05-20 MED ORDER — METFORMIN HCL 1000 MG PO TABS
1000.0000 mg | ORAL_TABLET | Freq: Two times a day (BID) | ORAL | 0 refills | Status: DC
Start: 2023-05-20 — End: 2023-06-17

## 2023-05-20 MED ORDER — METFORMIN HCL 1000 MG PO TABS
1000.0000 mg | ORAL_TABLET | Freq: Two times a day (BID) | ORAL | 0 refills | Status: DC
Start: 2023-05-20 — End: 2023-05-20

## 2023-05-20 NOTE — Progress Notes (Unsigned)
Chief Complaint:   OBESITY Kristi Hunter is here to discuss her progress with her obesity treatment plan along with follow-up of her obesity related diagnoses. Kristi Hunter is on following a lower carbohydrate, vegetable and lean protein rich diet plan and states she is following her eating plan approximately 25% of the time. Kristi Hunter states she is doing pool aerobics 2-3 times per week.  Today's visit was #: 38 Starting weight: 267 lbs Starting date: 02/22/2020 Today's weight: 258 lbs Today's date: 05/20/2023 Total lbs lost to date: 9 Total lbs lost since last in-office visit: 0  Interim History: Patient has been working on decreasing simple carbohydrates, but she eats a small bagel for breakfast, and she is eating keto bread and she eats ice cream twice a week which is likely limiting her weight loss efforts.  Subjective:   1. Grade I diastolic dysfunction Patient had an echocardiogram done earlier this year.  She notes exercise intolerance but this appears to be due to her deconditioning.  2. Essential hypertension Patient's blood pressure is borderline elevated today.  She has struggled with weight loss.  She has no problems with her medications.  3. Pre-diabetes Patient is stable on metformin, and she is trying to eat healthier.  No side effects were noted on her medications.  4. Emotional Eating Behavior Patient notes increased comfort eating and she is frustrated with her struggles with weight loss.  She notes decreased motivation to change.  She is open to seeing Dr. Dewaine Conger to help her with her emotional eating behavior.  Assessment/Plan:   1. Grade I diastolic dysfunction Patient was educated on early diastolic dysfunction.  She will continue to work on her diet, exercise, and weight loss to decrease the chances of this progressing further.  2. Essential hypertension We will continue to work with the patient on lifestyle changes to help control her hypertension.  3.  Pre-diabetes Patient will continue metformin 1000 mg twice daily, and we will refill for 1 month.  - metFORMIN (GLUCOPHAGE) 1000 MG tablet; Take 1 tablet (1,000 mg total) by mouth 2 (two) times daily with a meal.  Dispense: 60 tablet; Refill: 0  4. Emotional Eating Behavior Patient was referred to Dr. Dewaine Conger, our Bariatric Psychologist, for evaluation due to her significant struggles with emotional eating behavior.  5. BMI 45.0-49.9, adult (HCC)  6. Obesity, Beginning BMI 48.83 Kristi Hunter is currently in the action stage of change. As such, her goal is to continue with weight loss efforts. She has agreed to change to keeping a food journal and adhering to recommended goals of 1200-1300 calories and 75+ grams of protein daily.   Exercise goals: As is.   Behavioral modification strategies: decreasing simple carbohydrates.  Kristi Hunter has agreed to follow-up with our clinic in 4 weeks. She was informed of the importance of frequent follow-up visits to maximize her success with intensive lifestyle modifications for her multiple health conditions.   Objective:   Blood pressure 139/80, pulse 89, temperature 97.7 F (36.5 C), height 5\' 3"  (1.6 m), weight 258 lb (117 kg), SpO2 93%. Body mass index is 45.7 kg/m.  Lab Results  Component Value Date   CREATININE 0.58 03/18/2023   BUN 24 (H) 03/18/2023   NA 138 03/18/2023   K 3.7 03/18/2023   CL 101 03/18/2023   CO2 29 03/18/2023   Lab Results  Component Value Date   ALT 15 08/23/2022   AST 15 08/23/2022   ALKPHOS 43 03/15/2022   BILITOT 0.3 03/15/2022  Lab Results  Component Value Date   HGBA1C 5.8 08/23/2022   HGBA1C 5.9 (H) 02/20/2022   HGBA1C 5.8 (H) 12/25/2021   HGBA1C 5.7 (H) 04/12/2021   HGBA1C 5.8 (H) 02/19/2021   Lab Results  Component Value Date   INSULIN 22.5 02/20/2022   INSULIN 37.9 (H) 04/12/2021   INSULIN 21.5 02/22/2020   Lab Results  Component Value Date   TSH 3.10 08/23/2022   Lab Results  Component  Value Date   CHOL 161 08/23/2022   HDL 64 08/23/2022   LDLCALC 84 08/23/2022   TRIG 58 08/23/2022   CHOLHDL 3 01/13/2019   Lab Results  Component Value Date   VD25OH 32.7 02/20/2022   VD25OH 35.1 12/25/2021   VD25OH 46.8 04/12/2021   Lab Results  Component Value Date   WBC 7.5 03/18/2023   HGB 12.1 03/18/2023   HCT 38.1 03/18/2023   MCV 88.8 03/18/2023   PLT 279 03/18/2023   No results found for: "IRON", "TIBC", "FERRITIN"  Attestation Statements:   Reviewed by clinician on day of visit: allergies, medications, problem list, medical history, surgical history, family history, social history, and previous encounter notes.  Time spent on visit including pre-visit chart review and post-visit care and charting was 40 minutes.   I, Burt Knack, am acting as transcriptionist for Quillian Quince, MD.  I have reviewed the above documentation for accuracy and completeness, and I agree with the above. -  Quillian Quince, MD

## 2023-05-22 ENCOUNTER — Other Ambulatory Visit: Payer: Self-pay | Admitting: Family Medicine

## 2023-05-23 NOTE — Telephone Encounter (Signed)
Rx refill request approved per Dr. Corey's orders. 

## 2023-05-26 ENCOUNTER — Telehealth (INDEPENDENT_AMBULATORY_CARE_PROVIDER_SITE_OTHER): Payer: Medicare HMO | Admitting: Psychology

## 2023-05-27 DIAGNOSIS — M25551 Pain in right hip: Secondary | ICD-10-CM | POA: Diagnosis not present

## 2023-05-27 DIAGNOSIS — M6281 Muscle weakness (generalized): Secondary | ICD-10-CM | POA: Diagnosis not present

## 2023-05-29 DIAGNOSIS — M25551 Pain in right hip: Secondary | ICD-10-CM | POA: Diagnosis not present

## 2023-05-29 DIAGNOSIS — M6281 Muscle weakness (generalized): Secondary | ICD-10-CM | POA: Diagnosis not present

## 2023-06-03 DIAGNOSIS — F341 Dysthymic disorder: Secondary | ICD-10-CM | POA: Diagnosis not present

## 2023-06-03 DIAGNOSIS — M6281 Muscle weakness (generalized): Secondary | ICD-10-CM | POA: Diagnosis not present

## 2023-06-03 DIAGNOSIS — F4389 Other reactions to severe stress: Secondary | ICD-10-CM | POA: Diagnosis not present

## 2023-06-03 DIAGNOSIS — M25551 Pain in right hip: Secondary | ICD-10-CM | POA: Diagnosis not present

## 2023-06-04 ENCOUNTER — Telehealth (INDEPENDENT_AMBULATORY_CARE_PROVIDER_SITE_OTHER): Payer: Self-pay | Admitting: Family Medicine

## 2023-06-04 NOTE — Telephone Encounter (Signed)
Patient called in stating she usually takes Metformin 500 mg 2 times a day, however when she went to the pharmacy to pick up her refill it was for 1000 mg 2 times a day. Patient states increasing this medication dosage was not talked about and so she was wondering if the increase is a mistake? Patient asks for someone to call her to let her know.  Thank you

## 2023-06-09 ENCOUNTER — Telehealth (INDEPENDENT_AMBULATORY_CARE_PROVIDER_SITE_OTHER): Payer: Self-pay

## 2023-06-09 NOTE — Telephone Encounter (Signed)
Left pt msg to return my call re: Metformin

## 2023-06-11 ENCOUNTER — Ambulatory Visit: Payer: Medicare HMO | Admitting: Family Medicine

## 2023-06-11 ENCOUNTER — Encounter: Payer: Self-pay | Admitting: Family Medicine

## 2023-06-11 VITALS — BP 118/80 | HR 63 | Ht 63.0 in | Wt 259.0 lb

## 2023-06-11 DIAGNOSIS — M545 Low back pain, unspecified: Secondary | ICD-10-CM | POA: Diagnosis not present

## 2023-06-11 DIAGNOSIS — G8929 Other chronic pain: Secondary | ICD-10-CM | POA: Diagnosis not present

## 2023-06-11 DIAGNOSIS — R600 Localized edema: Secondary | ICD-10-CM | POA: Diagnosis not present

## 2023-06-11 NOTE — Patient Instructions (Signed)
Thank you for coming in today.   Increase the lasix to 2x daily if needed.   Use the pumps.   Follow up with Dr Wyline Mood as scheduled on 11/20.   Recheck as needed.

## 2023-06-11 NOTE — Telephone Encounter (Signed)
Spoke with patient in reference to the increase in Metformin. Patient was informed per Dr. Dalbert Garnet and last OV note, she increased Metformin to 1000 mg twice a day. Patient verbalized understanding and agreed with plan.

## 2023-06-11 NOTE — Progress Notes (Signed)
I, Stevenson Clinch, CMA acting as a scribe for Kristi Graham, MD.  Kristi Hunter is a 82 y.o. female who presents to [facility] at [facility] today for f/u R hip and low back pain. Pt was last seen by Dr. Denyse Hunter on 04/30/23 and was referred to PT at [facility], advised to use a heating pad, and prescribed tizanidine.  Today, pt reports continued right hip and right knee pain, though the hip is doing better over all. PT has been beneficial. Compliant with HEP. Has concerns about posture. Lower back flares up when standing more erect, improves with leaning forward. Notes increased swelling in the L LE.   Dx imaging: 04/30/23 R hip & L-spine XR  Pertinent review of systems: No fevers or chills  Relevant historical information: Hypertension   Exam:  BP 118/80   Pulse 63   Ht 5\' 3"  (1.6 m)   Wt 259 lb (117.5 kg)   SpO2 95%   BMI 45.88 kg/m  General: Well Developed, well nourished, and in no acute distress.   MSK: L-spine: Normal appearing Nontender palpation spinal midline.  Limited range of motion lacks full extension lumbar spine.  Lower extremities bilaterally nonpitting edema with some venous stasis dermatitis changes present.    Lab and Radiology Results .  EXAM: LUMBAR SPINE - 2-3 VIEW   COMPARISON:  None Available.   FINDINGS: There is no evidence of lumbar spine fracture. Mild chronic compression deformities L2, L3. Grade 1 anterolisthesis of L4 on L5. Scoliosis. Anterolisthesis noted in the lower thoracic and upper lumbar spine. Facet joint sclerosis in the mid to lower lumbar spine.   IMPRESSION: No acute fracture or dislocation identified in the lumbar spine. Degenerative joint changes of lumbar spine.     Electronically Signed   By: Sherian Rein M.D.   On: 04/30/2023 11:25   I, Kristi Hunter, personally (independently) visualized and performed the interpretation of the images attached in this note.    Assessment and Plan: 82 y.o.  female with chronic low back pain worse with extension.  This is due to the facet DJD seen on x-ray and I think very likely spinal stenosis.  I talked about options.  She is working with physical therapy which seems to be helpful.  Next step if needed would be CT myelogram and targeted injections in her lumbar spine.  She does not think her symptoms are bad enough to proceed to that at next step at this time.  Lower extremity edema bilaterally.  This is secondary to venous reflux and likely some fluid overload.  She cannot tolerate compression stockings but does have a compression device that she uses at home intermittently.  Additionally she is taking Lasix 20 mg daily.  She does get do the diuresis for about 6 hours with the 20 mg of Lasix.  I recommended that on bad days she could increase her Lasix to twice daily.  She will do so and has an appointment scheduled to follow-up with her cardiologist on November 20.  If she is taking increases doses of Lasix she should probably have her metabolic panel checked.   PDMP not reviewed this encounter. No orders of the defined types were placed in this encounter.  No orders of the defined types were placed in this encounter.    Discussed warning signs or symptoms. Please see discharge instructions. Patient expresses understanding.   The above documentation has been reviewed and is accurate and complete Kristi Hunter, M.D. Total encounter  time 30 minutes including face-to-face time with the patient and, reviewing past medical record, and charting on the date of service.

## 2023-06-17 ENCOUNTER — Telehealth (INDEPENDENT_AMBULATORY_CARE_PROVIDER_SITE_OTHER): Payer: Medicare HMO | Admitting: Psychology

## 2023-06-17 ENCOUNTER — Ambulatory Visit (INDEPENDENT_AMBULATORY_CARE_PROVIDER_SITE_OTHER): Payer: Medicare HMO | Admitting: Family Medicine

## 2023-06-17 ENCOUNTER — Encounter (INDEPENDENT_AMBULATORY_CARE_PROVIDER_SITE_OTHER): Payer: Self-pay | Admitting: Family Medicine

## 2023-06-17 VITALS — BP 134/69 | HR 89 | Temp 97.5°F | Ht 63.0 in | Wt 255.0 lb

## 2023-06-17 DIAGNOSIS — Z6841 Body Mass Index (BMI) 40.0 and over, adult: Secondary | ICD-10-CM

## 2023-06-17 DIAGNOSIS — R7303 Prediabetes: Secondary | ICD-10-CM

## 2023-06-17 DIAGNOSIS — F5089 Other specified eating disorder: Secondary | ICD-10-CM | POA: Diagnosis not present

## 2023-06-17 DIAGNOSIS — E559 Vitamin D deficiency, unspecified: Secondary | ICD-10-CM

## 2023-06-17 DIAGNOSIS — E669 Obesity, unspecified: Secondary | ICD-10-CM | POA: Diagnosis not present

## 2023-06-17 DIAGNOSIS — R252 Cramp and spasm: Secondary | ICD-10-CM | POA: Diagnosis not present

## 2023-06-17 MED ORDER — METFORMIN HCL 1000 MG PO TABS
1000.0000 mg | ORAL_TABLET | Freq: Two times a day (BID) | ORAL | 0 refills | Status: DC
Start: 2023-06-17 — End: 2023-08-07

## 2023-06-17 NOTE — Progress Notes (Signed)
.[facility]  Office: [phone]  /  Fax: [phone]  WEIGHT SUMMARY AND BIOMETRICS  Anthropometric Measurements Height: 5\' 3"  (1.6 m) Weight: 255 lb (115.7 kg) BMI (Calculated): 45.18 Weight at Last Visit: 258 lb Weight Lost Since Last Visit: 3 lb Weight Gained Since Last Visit: 0 Starting Weight: 267 lb Total Weight Loss (lbs): 12 lb (5.443 kg)   Body Composition  Body Fat %: 59.1 % Fat Mass (lbs): 151 lbs Muscle Mass (lbs): 99.2 lbs Visceral Fat Rating : 23   Other Clinical Data Fasting: No Labs: No Today's Visit #: 83 Starting Date: 02/22/20    Chief Complaint: OBESITY   History of Present Illness   The patient, with a history of prediabetes and obesity, has been following a low-carb diet approximately 50% of the time and has lost three pounds in the past month. She has been engaging in exercise classes and pool exercises for 30-45 minutes three times a week. She is currently on Metformin 1000mg  twice a day for prediabetes, with the most recent HbA1c at 5.8. She also has a history of Vitamin D deficiency and is on calcium and Vitamin D supplements, though the last Vitamin D level was below goal.  The patient has been experiencing muscle cramps, particularly in the legs and fingers, and has a family history of low magnesium levels. She has been managing her diet well, with a focus on protein and vegetables, and minimal indulgence in desserts. She has been experiencing constipation, not having had a bowel movement in two days at the time of the consultation.  The patient is due for a wellness check at her residential facility, [place] [facility], and has an upcoming appointment with Dr. Harlin Heys. She is also planning to travel to [place] at the end of January for about a month.          PHYSICAL EXAM:  Blood pressure 134/69, pulse 89, temperature (!) 97.5 F (36.4 C), height 5\' 3"  (1.6 m), weight 255 lb (115.7 kg), SpO2 94%. Body mass index is 45.17 kg/m.  DIAGNOSTIC DATA  REVIEWED:  BMET    Component Value Date/Time   NA 138 03/18/2023 0845   NA 140 08/23/2022 0000   K 3.7 03/18/2023 0845   CL 101 03/18/2023 0845   CO2 29 03/18/2023 0845   GLUCOSE 102 (H) 03/18/2023 0845   BUN 24 (H) 03/18/2023 0845   BUN 19 08/23/2022 0000   CREATININE 0.58 03/18/2023 0845   CREATININE 0.76 12/11/2021 1226   CALCIUM 8.6 (L) 03/18/2023 0845   GFRNONAA >60 03/18/2023 0845   GFRNONAA >60 12/11/2021 1226   GFRAA 104 02/22/2020 1339   Lab Results  Component Value Date   HGBA1C 5.8 08/23/2022   HGBA1C 6.0 01/13/2019   Lab Results  Component Value Date   INSULIN 22.5 02/20/2022   INSULIN 21.5 02/22/2020   Lab Results  Component Value Date   TSH 3.10 08/23/2022   CBC    Component Value Date/Time   WBC 7.5 03/18/2023 0845   RBC 4.29 03/18/2023 0845   HGB 12.1 03/18/2023 0845   HGB 12.6 02/20/2022 0942   HCT 38.1 03/18/2023 0845   HCT 39.6 02/20/2022 0942   PLT 279 03/18/2023 0845   PLT 318 02/20/2022 0942   MCV 88.8 03/18/2023 0845   MCV 87 02/20/2022 0942   MCH 28.2 03/18/2023 0845   MCHC 31.8 03/18/2023 0845   RDW 13.4 03/18/2023 0845   RDW 13.0 02/20/2022 0942   Iron Studies No results found for: "IRON", "TIBC", "  FERRITIN", "IRONPCTSAT" Lipid Panel     Component Value Date/Time   CHOL 161 08/23/2022 0000   CHOL 159 02/20/2022 0942   TRIG 58 08/23/2022 0000   HDL 64 08/23/2022 0000   HDL 49 02/20/2022 0942   CHOLHDL 3 01/13/2019 0913   VLDL 14.4 01/13/2019 0913   LDLCALC 84 08/23/2022 0000   LDLCALC 98 02/20/2022 0942   Hepatic Function Panel     Component Value Date/Time   PROT 6.5 03/15/2022 1149   PROT 6.2 02/20/2022 0942   ALBUMIN 3.5 08/23/2022 0000   ALBUMIN 3.7 (L) 02/20/2022 0942   AST 15 08/23/2022 0000   AST 19 12/11/2021 1226   ALT 15 08/23/2022 0000   ALT 17 12/11/2021 1226   ALKPHOS 43 03/15/2022 1149   BILITOT 0.3 03/15/2022 1149   BILITOT 0.2 02/20/2022 0942   BILITOT 0.3 12/11/2021 1226   BILIDIR 0.1  03/15/2022 1149      Component Value Date/Time   TSH 3.10 08/23/2022 0000   TSH 1.470 02/22/2020 1339   Nutritional Lab Results  Component Value Date   VD25OH 32.7 02/20/2022   VD25OH 35.1 12/25/2021   VD25OH 46.8 04/12/2021     Assessment and Plan    Prediabetes Hemoglobin A1c of 5.8 earlier in the year. Currently on Metformin 1000mg  twice daily. Reports adherence to low carb diet approximately 50% of the time and regular exercise. -Continue Metformin 1000mg  twice daily. -Encourage adherence to low carb diet and regular exercise. -Check Hemoglobin A1c and other labs today.  Obesity Weight loss of 3 pounds in the last month. Following a low carb diet and regular exercise. -Continue low carb diet and regular exercise. -Plan for follow-up visit before patient's trip in late January.  Vitamin D deficiency Last Vitamin D level was below goal. Currently on calcium and Vitamin D supplements. -Check Vitamin D and calcium levels today. -Encourage regular intake of calcium supplements.  Muscle cramps Reports muscle cramps in legs and fingers and requests a magnesium lab checked today. -Check magnesium and other electrolyte levels today.          She was informed of the importance of frequent follow up visits to maximize her success with intensive lifestyle modifications for her multiple health conditions.    Quillian Quince, MD

## 2023-06-17 NOTE — Progress Notes (Signed)
Office: [phone]  /  Fax: [phone]    Date: June 17, 2023    Appointment Start Time: 12:01pm Duration: 40 minutes Provider: Lawerance Cruel, Psy.D. Type of Session: Intake for Individual Therapy  Location of Patient: Home (private location) Location of Provider: Provider's home (private office) Type of Contact: Telepsychological Visit via MyChart Video Visit  Informed Consent: Prior to proceeding with today's appointment, two pieces of identifying information were obtained. In addition, Kristi Hunter's physical location at the time of this appointment was obtained as well a phone number she could be reached at in the event of technical difficulties. Celie and this provider participated in today's telepsychological service.   The provider's role was explained to Kristi Hunter. The provider reviewed and discussed issues of confidentiality, privacy, and limits therein (e.g., reporting obligations). In addition to verbal informed consent, written informed consent for psychological services was obtained prior to the initial appointment. Since the clinic is not a 24/7 crisis center, mental health emergency resources were shared and this  provider explained MyChart, e-mail, voicemail, and/or other messaging systems should be utilized only for non-emergency reasons. This provider also explained that information obtained during appointments will be placed in Kristi Hunter's medical record and relevant information will be shared with other providers at [facility] at any locations for coordination of care. [name] agreed information may be shared with other [facility] providers as needed for coordination of care and by signing the service agreement document, she provided written consent for coordination of care. Prior to initiating telepsychological services, [name] completed an informed consent document, which included the development of a safety plan (i.e., an  emergency contact and emergency resources) in the event of an emergency/crisis. [name] verbally acknowledged understanding she is ultimately responsible for understanding her insurance benefits for telepsychological and in-person services. This provider also reviewed confidentiality, as it relates to telepsychological services. [name]  acknowledged understanding that appointments cannot be recorded without both party consent and she is aware she is responsible for securing confidentiality on her end of the session. [name] verbally consented to proceed.  Chief Complaint/HPI: Kristi Hunter was referred by Kristi Hunter due to  emotional eating behavior . Per the note for the visit with Kristi Hunter on 05/20/2023, "Patient notes increased comfort eating and she is frustrated with her struggles with weight loss. She notes decreased motivation to change. She is open to seeing Dr. Dewaine Conger to help her with her emotional eating behavior." Kristi Hunter previously met with this provider in 2021 to address emotional eating-related concerns.    During today's appointment, [name] reported engagement in emotional eating behaviors, noting, "It's not a matter of quantity." She was verbally administered a questionnaire assessing various behaviors related to emotional eating behaviors. [name] endorsed the following: experience food cravings on a regular basis, eat certain foods when you are anxious, stressed, depressed, or your feelings are hurt, use food to help you cope with emotional situations, find food is comforting to you, overeat when you are worried about something, overeat frequently when you are bored or lonely, overeat when you are alone, but eat much less when you are with other people, and eat as a reward. She shared she craves sweets. [name] believes the onset of emotional eating behaviors was likely after she had children, and described the current frequency of emotional eating behaviors as "few  times a week." In addition, Kristi Hunter denied a history of binge eating behaviors. Kristi Hunter denied a history of significantly restricting food intake, purging and engagement  in other compensatory strategies for weight loss. She also denied treatment for emotional eating behaviors since the last appointment with this provider.  Mental Status Examination:  Appearance: neat Behavior: appropriate to circumstances Mood: neutral Affect: mood congruent Speech: WNL Eye Contact: appropriate Psychomotor Activity: WNL Gait: unable to assess  Thought Process: linear, logical, and goal directed and denies suicidal, homicidal, and self-harm ideation, plan and intent  Thought Content/Perception: no hallucinations, delusions, bizarre thinking or behavior endorsed or observed Orientation: AAOx4 Memory/Concentration: intact Insight/Judgment: fair  Family & Psychosocial History: Kristi Hunter reported she is not in a relationship and she has three adult children. She indicated she is currently retired. Additionally, Clotilda shared her highest level of education obtained is a bachelor's degree. Currently, Kristi Hunter's social support system consists of her children, grandchildren, and friends from [place]. Moreover, Kristi Hunter stated she resides alone, but at [facility] [facility].   Medical History:  Past Medical History:  Diagnosis Date   Anemia    Anxiety    Arthritis    several different parts of the body per pt 10/17/2022   Back pain    Breast cancer (HCC)    02/20/21   Cancer (HCC)    uterine   Chest pain    Chicken pox    Chronic kidney disease    Constipation    Emphysema of lung (HCC)    Family history of breast cancer 02/14/2021   Glaucoma    Hypertension    Joint pain    Lower extremity edema    Lymphedema    Osteoarthritis    Personal history of malignant neoplasm of uterus 02/14/2021   Pre-diabetes    Prediabetes    Shortness of breath    Sleep apnea    doesn't use CPAP machine anymore    Urine incontinence    Uterine cancer (HCC)    2011   UTI (urinary tract infection)    Vertigo    had vertigo last week. 10/17/2022   Vitamin B 12 deficiency    during pregnancy   Past Surgical History:  Procedure Laterality Date   ABDOMINAL HYSTERECTOMY  2011   BOTOX INJECTION N/A 10/22/2022   Procedure: BOTOX INJECTION 100 UNITS;  Surgeon: Noel Christmas, MD;  Location: [facility];  Service: Urology;  Laterality: N/A;   BREAST LUMPECTOMY WITH RADIOACTIVE SEED LOCALIZATION Right 02/20/2021   Procedure: RIGHT BREAST LUMPECTOMY WITH RADIOACTIVE SEED LOCALIZATION;  Surgeon: Almond Lint, MD;  Location: [facility];  Service: General;  Laterality: Right;   CYSTOSCOPY WITH INJECTION N/A 10/22/2022   Procedure: CYSTOSCOPY WITH INJECTION OF Mariam Dollar;  Surgeon: Noel Christmas, MD;  Location: [facility];  Service: Urology;  Laterality: N/A;   IR ANGIOGRAM EXTREMITY RIGHT  03/18/2023   IR ANGIOGRAM SELECTIVE EACH ADDITIONAL VESSEL  03/18/2023   IR ANGIOGRAM SELECTIVE EACH ADDITIONAL VESSEL  03/18/2023   IR EMBO ARTERIAL NOT HEMORR HEMANG INC GUIDE ROADMAPPING  03/18/2023   IR RADIOLOGIST EVAL & MGMT  02/05/2023   IR RADIOLOGIST EVAL & MGMT  04/18/2023   IR US GUIDE VASC ACCESS RIGHT  03/18/2023   REPLACEMENT TOTAL KNEE Right 2012   Current Outpatient Medications on File Prior to Visit  Medication Sig Dispense Refill   acetaminophen (TYLENOL) 650 MG CR tablet Take 650 mg by mouth every 8 (eight) hours as needed for pain. Takes usually once a day     alendronate (FOSAMAX) 70 MG tablet TAKE 1 TABLET (70 MG TOTAL) BY MOUTH EVERY 7 (SEVEN) DAYS. TAKE WITH  A FULL GLASS OF WATER ON AN EMPTY STOMACH. 12 tablet 3   calcium carbonate (OS-CAL) 600 MG TABS tablet Take 1 tablet (600 mg total) by mouth 2 (two) times daily with a meal. (Patient taking differently: Take 600 mg by mouth as needed.) 180 tablet 4   chlorthalidone (HYGROTON) 25 MG tablet Take 1 tablet (25 mg total) by mouth daily.  30 tablet 0   diclofenac Sodium (VOLTAREN) 1 % GEL Apply 2 g topically daily as needed (Osteoarthritis).     fluticasone (FLONASE) 50 MCG/ACT nasal spray Place 2 sprays into both nostrils in the morning and at bedtime. 16 g 6   furosemide (LASIX) 20 MG tablet Take 1 tablet (20 mg total) by mouth daily. 90 tablet 3   Melatonin 10 MG TABS Take 10 mg by mouth at bedtime as needed (sleep).     metFORMIN (GLUCOPHAGE) 1000 MG tablet Take 1 tablet (1,000 mg total) by mouth 2 (two) times daily with a meal. 60 tablet 0   metroNIDAZOLE (METROGEL) 1 % gel Apply topically daily. (Patient taking differently: Apply 1 application  topically daily as needed (rosacea).) 60 g 1   Multiple Vitamin (MULTIVITAMIN) tablet Take 1 tablet by mouth daily.     naproxen (NAPROSYN) 500 MG tablet Take 1 tablet (500 mg total) by mouth 2 (two) times daily with a meal. 10 tablet 0   tamoxifen (NOLVADEX) 20 MG tablet TAKE 1 TABLET BY MOUTH EVERY DAY 90 tablet 3   tiZANidine (ZANAFLEX) 2 MG tablet TAKE 1-2 TABLETS (2-4 MG TOTAL) BY MOUTH EVERY 8 (EIGHT) HOURS AS NEEDED. 180 tablet 1   No current facility-administered medications on file prior to visit.  Lezley stated she is medication compliant.   Mental Health History: Edra reported since meeting with this provider in 2021, she initiated therapeutic services with "Otila Kluver" with [employer] [facility] to address family-related concerns. She reported she also started taking a sleeping aide PRN. Talonda reported there is no history of hospitalizations for psychiatric concerns. Jem endorsed a family history of alcohol abuse (brother, paternal grandfather, and paternal cousins), bipolar disorder (daughter and granddaughter), and substance abuse (granddaughter). Furthermore, Aryn reported there is no history of trauma including psychological, physical , and sexual abuse, as well as neglect.   Ishi described her typical mood lately as "pretty good, pretty stable."  Additionally, she described herself as "claustrophobic." Rashawn endorsed current alcohol use (1-2 standard pours of wine daily). She denied tobacco use. She denied illicit/recreational substance use. Furthermore, Ailin indicated she is not experiencing the following: hallucinations and delusions, paranoia, symptoms of mania , social withdrawal, crying spells, memory concerns, attention and concentration issues, and obsessions and compulsions. She also denied experiencing suicidal ideation, plan, and intent; homicidal ideation, plan, and intent; and  self-harm ideation, plan and intent since the last appointment with this provider.   Legal History: Levenia reported there is no history of legal involvement.   Structured Assessments Results: The Patient Health Questionnaire-9 (PHQ-9) is a self-report measure that assesses symptoms and severity of depression over the course of the last two weeks. Briannie obtained a score of 7 suggesting mild depression. Gabryel finds the endorsed symptoms to be not difficult at all. [0= Not at all; 1= Several days; 2= More than half the days; 3= Nearly every day] Little interest or pleasure in doing things 0  Feeling down, depressed, or hopeless 0  Trouble falling or staying asleep, or sleeping too much 3  Feeling tired or having little energy 0  Poor  appetite or overeating 0  Feeling bad about yourself --- or that you are a failure or have let yourself or your family down 3  Trouble concentrating on things, such as reading the newspaper or watching television 1  Moving or speaking so slowly that other people could have noticed? Or the opposite --- being so fidgety or restless that you have been moving around a lot more than usual 0  Thoughts that you would be better off dead or hurting yourself in some way 0  PHQ-9 Score 7    The Generalized Anxiety Disorder-7 (GAD-7) is a brief self-report measure that assesses symptoms of anxiety over the course of the  last two weeks. Alizaya obtained a score of 3 suggesting minimal anxiety. Gargi finds the endorsed symptoms to be not difficult at all. [0= Not at all; 1= Several days; 2= Over half the days; 3= Nearly every day] Feeling nervous, anxious, on edge 1  Not being able to stop or control worrying 1  Worrying too much about different things 0  Trouble relaxing 0  Being so restless that it's hard to sit still 1  Becoming easily annoyed or irritable 0  Feeling afraid as if something awful might happen 0  GAD-7 Score 3   Interventions:  Conducted a chart review Focused on rapport building Verbally administered PHQ-9 and GAD-7 for symptom monitoring Verbally administered Food & Mood questionnaire to assess various behaviors related to emotional eating Provided emphatic reflections and validation Psychoeducation provided regarding physical versus emotional hunger  Diagnostic Impressions & Provisional DSM-5 Diagnosis(es): Colie discussed a history of engagement in emotional eating behaviors, and described the current frequency as "few times a week." She denied engagement in any other disordered eating behaviors. Based on the aforementioned, the following diagnosis was assigned: F50.89 Other Specified Feeding or Eating Disorder, Emotional Eating Behaviors.  Plan: Volanda appears able and willing to participate as evidenced by engagement in reciprocal conversation and asking questions as needed for clarification. The next appointment is scheduled for 06/24/2023 at 11:30am, which will be via MyChart Video Visit. The following treatment goal was established: increase coping skills. This provider will regularly review the treatment plan and medical chart to keep informed of status changes. Calle expressed understanding and agreement with the initial treatment plan of care. Luciel will be sent a handout via e-mail to utilize between now and the next appointment to increase awareness of hunger  patterns and subsequent eating. Mariela provided verbal consent during today's appointment for this provider to send the handout via e-mail. Duyen will continue with her primary therapist.

## 2023-06-18 DIAGNOSIS — F341 Dysthymic disorder: Secondary | ICD-10-CM | POA: Diagnosis not present

## 2023-06-18 LAB — CMP14+EGFR
ALT: 16 [IU]/L (ref 0–32)
AST: 18 [IU]/L (ref 0–40)
Albumin: 3.6 g/dL — ABNORMAL LOW (ref 3.8–4.8)
Alkaline Phosphatase: 59 [IU]/L (ref 44–121)
BUN/Creatinine Ratio: 30 — ABNORMAL HIGH (ref 12–28)
BUN: 18 mg/dL (ref 8–27)
Bilirubin Total: <0.2 mg/dL (ref 0.0–1.2)
CO2: 31 mmol/L — ABNORMAL HIGH (ref 20–29)
Calcium: 9.2 mg/dL (ref 8.7–10.3)
Chloride: 100 mmol/L (ref 96–106)
Creatinine, Ser: 0.6 mg/dL (ref 0.57–1.00)
Globulin, Total: 2.8 g/dL (ref 1.5–4.5)
Glucose: 103 mg/dL — ABNORMAL HIGH (ref 70–99)
Potassium: 3.5 mmol/L (ref 3.5–5.2)
Sodium: 144 mmol/L (ref 134–144)
Total Protein: 6.4 g/dL (ref 6.0–8.5)
eGFR: 91 mL/min/{1.73_m2} (ref >59)

## 2023-06-18 LAB — HEMOGLOBIN A1C
Est. average glucose Bld gHb Est-mCnc: 128 mg/dL
Hgb A1c MFr Bld: 6.1 % — ABNORMAL HIGH (ref 4.8–5.6)

## 2023-06-18 LAB — MAGNESIUM: Magnesium: 1.7 mg/dL (ref 1.6–2.3)

## 2023-06-18 LAB — VITAMIN D 25 HYDROXY (VIT D DEFICIENCY, FRACTURES): Vit D, 25-Hydroxy: 30.9 ng/mL (ref 30.0–100.0)

## 2023-06-23 ENCOUNTER — Ambulatory Visit: Payer: Medicare HMO | Admitting: Hematology and Oncology

## 2023-06-24 ENCOUNTER — Telehealth (INDEPENDENT_AMBULATORY_CARE_PROVIDER_SITE_OTHER): Payer: Medicare HMO | Admitting: Psychology

## 2023-06-24 ENCOUNTER — Encounter: Payer: Self-pay | Admitting: Hematology and Oncology

## 2023-06-24 ENCOUNTER — Inpatient Hospital Stay: Payer: Medicare HMO | Attending: Hematology and Oncology | Admitting: Hematology and Oncology

## 2023-06-24 VITALS — BP 147/49 | HR 93 | Temp 97.5°F | Resp 18 | Wt 261.4 lb

## 2023-06-24 DIAGNOSIS — C50411 Malignant neoplasm of upper-outer quadrant of right female breast: Secondary | ICD-10-CM | POA: Diagnosis not present

## 2023-06-24 DIAGNOSIS — Z803 Family history of malignant neoplasm of breast: Secondary | ICD-10-CM | POA: Insufficient documentation

## 2023-06-24 DIAGNOSIS — Z79810 Long term (current) use of selective estrogen receptor modulators (SERMs): Secondary | ICD-10-CM | POA: Diagnosis not present

## 2023-06-24 DIAGNOSIS — Z800 Family history of malignant neoplasm of digestive organs: Secondary | ICD-10-CM | POA: Insufficient documentation

## 2023-06-24 DIAGNOSIS — Z90710 Acquired absence of both cervix and uterus: Secondary | ICD-10-CM | POA: Diagnosis not present

## 2023-06-24 DIAGNOSIS — Z170 Estrogen receptor positive status [ER+]: Secondary | ICD-10-CM | POA: Diagnosis not present

## 2023-06-24 DIAGNOSIS — Z8542 Personal history of malignant neoplasm of other parts of uterus: Secondary | ICD-10-CM | POA: Diagnosis not present

## 2023-06-24 DIAGNOSIS — Z1721 Progesterone receptor positive status: Secondary | ICD-10-CM | POA: Diagnosis not present

## 2023-06-24 DIAGNOSIS — Z808 Family history of malignant neoplasm of other organs or systems: Secondary | ICD-10-CM | POA: Diagnosis not present

## 2023-06-24 DIAGNOSIS — Z1732 Human epidermal growth factor receptor 2 negative status: Secondary | ICD-10-CM | POA: Diagnosis not present

## 2023-06-24 DIAGNOSIS — M179 Osteoarthritis of knee, unspecified: Secondary | ICD-10-CM | POA: Diagnosis not present

## 2023-06-24 DIAGNOSIS — K5900 Constipation, unspecified: Secondary | ICD-10-CM | POA: Diagnosis not present

## 2023-06-24 DIAGNOSIS — Z87891 Personal history of nicotine dependence: Secondary | ICD-10-CM | POA: Insufficient documentation

## 2023-06-24 DIAGNOSIS — F40240 Claustrophobia: Secondary | ICD-10-CM | POA: Insufficient documentation

## 2023-06-24 DIAGNOSIS — F5089 Other specified eating disorder: Secondary | ICD-10-CM

## 2023-06-24 NOTE — Progress Notes (Signed)
BRIEF ONCOLOGIC HISTORY:   Oncology History  Malignant neoplasm of upper-outer quadrant of right breast in female, estrogen receptor positive (HCC)  02/01/2021 Imaging   EXAM: ULTRASOUND OF THE RIGHT BREAST  FINDINGS: Targeted ultrasound is performed, showing a 10 x 7 x 8 mm irregular hypoechoic mass right breast 10 o'clock position 1 cm from the nipple. No right axillary adenopathy.   IMPRESSION: Suspicious right breast mass 10 o'clock position.   02/05/2021 Initial Biopsy   Diagnosis Breast, right, needle core biopsy, 10 o'clock, 1cmfn - INVASIVE DUCTAL CARCINOMA WITH CALCIFICATIONS. SEE NOTE - DUCTAL CARCINOMA IN SITU, INTERMEDIATE GRADE Diagnosis Note Carcinoma measures 0.7 cm in greatest linear dimension and appears grade 2.  PROGNOSTIC INDICATORS Results: The tumor cells are EQUIVOCAL for Her2 (2+). Her2 by FISH will be performed and results reported separately. Estrogen Receptor: 100%, POSITIVE, STRONG STAINING INTENSITY Progesterone Receptor: 90%, POSITIVE, STRONG STAINING INTENSITY Proliferation Marker Ki67: 20%  FLUORESCENCE IN-SITU HYBRIDIZATION Results: GROUP 5: HER2 **NEGATIVE**   02/08/2021 Initial Diagnosis   Malignant neoplasm of upper-outer quadrant of right breast in female, estrogen receptor positive (HCC)   02/14/2021 Cancer Staging   Staging form: Breast, AJCC 8th Edition - Clinical stage from 02/14/2021: Stage IA (cT1b, cN0, cM0, G2, ER+, PR+, HER2-) - Signed by Lowella Dell, MD on 02/14/2021 Stage prefix: Initial diagnosis Histologic grading system: 3 grade system   02/20/2021 Cancer Staging   Staging form: Breast, AJCC 8th Edition - Pathologic stage from 02/20/2021: Stage IA (pT1c, pN0, cM0, G2, ER+, PR+, HER2-) - Signed by Loa Socks, NP on 08/07/2021 Histologic grading system: 3 grade system   02/20/2021 Definitive Surgery   FINAL MICROSCOPIC DIAGNOSIS:   A. BREAST, RIGHT, LUMPECTOMY:  - Invasive and in situ ductal carcinoma, 1.2  cm.  - Invasive carcinoma focally 0.1 cm from medial margin and 0.2 cm from anterior margin.  - Biopsy site and biopsy clip.  - See oncology table.    02/20/2021 Oncotype testing   Oncotype DX was obtained on the final surgical sample and the recurrence score of 8 predicts a risk of recurrence outside the breast over the next 9 years of 3%, if the patient's only systemic therapy is an antiestrogen for 5 years.  It also predicts no benefit from chemotherapy.    02/23/2021 Genetic Testing   Negative hereditary cancer genetic testing: no pathogenic variants detected in [facility] CustomNext-Cancer +RNAinsight Panel.  The report date is February 23, 2021.    The CustomNext-Cancer +RNAinsight Panel offered by [facility] and includes sequencing and rearrangement analysis for the following 91 genes: AIP, ALK, APC, ATM, AXIN2, BAP1, BARD1, BLM, BMPR1A, BRCA1, BRCA2, BRIP1, CDC73, CDH1, CDK4, CDKN1B, CDKN2A, CHEK2, CTNNA1, DICER1, FANCC, FH, FLCN, GALNT12, KIF1B, LZTR1, MAX, MEN1, MET, MLH1, MRE11A, MSH2, MSH3, MSH6, MUTYH, NBN, NF1, NF2, NTHL1, PALB2, PHOX2B, PMS2, POT1, PRKAR1A, PTCH1, PTEN, RAD50, RAD51C, RAD51D, RB1, RECQL, RET, SDHA, SDHAF2, SDHB, SDHC, SDHD, SMAD4, SMARCA4, SMARCB1, SMARCE1, STK11, SUFU, TMEM127, TP53, TSC1, TSC2, VHL and XRCC2 (sequencing and deletion/duplication); CASR, CFTR, CPA1, CTRC, EGFR, EGLN1, FAM175A, HOXB13, KIT, MITF, MLH3, PALLD, PDGFRA, POLD1, POLE, PRSS1, RINT1, RPS20, SPINK1 and TERT (sequencing only); EPCAM and GREM1 (deletion/duplication only). RNA data is routinely analyzed for use in variant interpretation for all genes.   05/02/2021 - 04/2026 Anti-estrogen oral therapy   tamoxifen started 05/02/2021             (A) s/p remote hysterectomy (for uterine cancer)             (  B) osteoporosis with bone density 2020 T score -2.6     INTERVAL HISTORY:   Ms. Coveney is here for follow-up.    Discussed the use of AI scribe software for clinical note transcription with the  patient, who gave verbal consent to proceed.  History of Present Illness        The patient, with a history of breast cancer and knee arthritis, presents for a routine follow-up. She reports no new symptoms or concerns. She has been taking tamoxifen for the past two years without any significant side effects. However, she has noticed an increase in constipation, which she is unsure if it is related to the tamoxifen or another medication. She reports a decrease in her exercise routine since the pandemic, which she believes may be contributing to her overall health. She has been managing her knee arthritis symptoms and has not reported any worsening. She has been performing self-breast exams at least once a week and has not noticed any new lumps or changes. She had a mammogram in May and is due for her next one in June.   ONCOLOGY TREATMENT TEAM:  1. Surgeon:  Dr. Donell Beers at [facility] 2. Medical Oncologist: Dr. Al Pimple  3. Radiation Oncologist: Dr. Mitzi Hansen    PAST MEDICAL/SURGICAL HISTORY:  Past Medical History:  Diagnosis Date   Anemia    Anxiety    Arthritis    several different parts of the body per pt 10/17/2022   Back pain    Breast cancer (HCC)    02/20/21   Cancer (HCC)    uterine   Chest pain    Chicken pox    Chronic kidney disease    Constipation    Emphysema of lung (HCC)    Family history of breast cancer 02/14/2021   Glaucoma    Hypertension    Joint pain    Lower extremity edema    Lymphedema    Osteoarthritis    Personal history of malignant neoplasm of uterus 02/14/2021   Pre-diabetes    Prediabetes    Shortness of breath    Sleep apnea    doesn't use CPAP machine anymore   Urine incontinence    Uterine cancer (HCC)    2011   UTI (urinary tract infection)    Vertigo    had vertigo last week. 10/17/2022   Vitamin B 12 deficiency    during pregnancy   Past Surgical History:  Procedure Laterality Date   ABDOMINAL HYSTERECTOMY  2011   BOTOX  INJECTION N/A 10/22/2022   Procedure: BOTOX INJECTION 100 UNITS;  Surgeon: Noel Christmas, MD;  Location: [facility];  Service: Urology;  Laterality: N/A;   BREAST LUMPECTOMY WITH RADIOACTIVE SEED LOCALIZATION Right 02/20/2021   Procedure: RIGHT BREAST LUMPECTOMY WITH RADIOACTIVE SEED LOCALIZATION;  Surgeon: Almond Lint, MD;  Location: [facility] OR;  Service: General;  Laterality: Right;   CYSTOSCOPY WITH INJECTION N/A 10/22/2022   Procedure: CYSTOSCOPY WITH INJECTION OF Mariam Dollar;  Surgeon: Noel Christmas, MD;  Location: [facility] [place];  Service: Urology;  Laterality: N/A;   IR ANGIOGRAM EXTREMITY RIGHT  03/18/2023   IR ANGIOGRAM SELECTIVE EACH ADDITIONAL VESSEL  03/18/2023   IR ANGIOGRAM SELECTIVE EACH ADDITIONAL VESSEL  03/18/2023   IR EMBO ARTERIAL NOT HEMORR HEMANG INC GUIDE ROADMAPPING  03/18/2023   IR RADIOLOGIST EVAL & MGMT  02/05/2023   IR RADIOLOGIST EVAL & MGMT  04/18/2023   IR US GUIDE VASC ACCESS RIGHT  03/18/2023  REPLACEMENT TOTAL KNEE Right 2012     ALLERGIES:  Allergies  Allergen Reactions   Amoxicillin Nausea And Vomiting   Percocet [Oxycodone-Acetaminophen] Other (See Comments)    hallucinations     CURRENT MEDICATIONS:  Outpatient Encounter Medications as of 06/24/2023  Medication Sig   acetaminophen (TYLENOL) 650 MG CR tablet Take 650 mg by mouth every 8 (eight) hours as needed for pain. Takes usually once a day   alendronate (FOSAMAX) 70 MG tablet TAKE 1 TABLET (70 MG TOTAL) BY MOUTH EVERY 7 (SEVEN) DAYS. TAKE WITH A FULL GLASS OF WATER ON AN EMPTY STOMACH.   calcium carbonate (OS-CAL) 600 MG TABS tablet Take 1 tablet (600 mg total) by mouth 2 (two) times daily with a meal. (Patient taking differently: Take 600 mg by mouth as needed.)   chlorthalidone (HYGROTON) 25 MG tablet Take 1 tablet (25 mg total) by mouth daily.   diclofenac Sodium (VOLTAREN) 1 % GEL Apply 2 g topically daily as needed (Osteoarthritis).   fluticasone (FLONASE) 50 MCG/ACT  nasal spray Place 2 sprays into both nostrils in the morning and at bedtime.   furosemide (LASIX) 20 MG tablet Take 1 tablet (20 mg total) by mouth daily.   Melatonin 10 MG TABS Take 10 mg by mouth at bedtime as needed (sleep).   metFORMIN (GLUCOPHAGE) 1000 MG tablet Take 1 tablet (1,000 mg total) by mouth 2 (two) times daily with a meal.   metroNIDAZOLE (METROGEL) 1 % gel Apply topically daily. (Patient taking differently: Apply 1 application  topically daily as needed (rosacea).)   Multiple Vitamin (MULTIVITAMIN) tablet Take 1 tablet by mouth daily.   naproxen (NAPROSYN) 500 MG tablet Take 1 tablet (500 mg total) by mouth 2 (two) times daily with a meal.   tamoxifen (NOLVADEX) 20 MG tablet TAKE 1 TABLET BY MOUTH EVERY DAY   tiZANidine (ZANAFLEX) 2 MG tablet TAKE 1-2 TABLETS (2-4 MG TOTAL) BY MOUTH EVERY 8 (EIGHT) HOURS AS NEEDED.   No facility-administered encounter medications on file as of 06/24/2023.     ONCOLOGIC FAMILY HISTORY:  Family History  Problem Relation Age of Onset   Heart attack Mother    Heart disease Mother    Hyperlipidemia Mother    Hypertension Mother    Stroke Mother    Miscarriages / [place] Mother    Diabetes Mother    Thyroid disease Mother    Cancer Mother    Obesity Mother    Melanoma Mother        dx after 30, sun-exposed areas   Cancer Father        skin   Alcohol abuse Father    Asthma Father    COPD Father    Heart attack Father    Heart disease Father    Obesity Father    Melanoma Father        dx after 83, back   Alcohol abuse Brother    Arthritis Brother    Drug abuse Brother    Alcohol abuse Brother    Cancer Brother        ? esophageal, d. 49   Breast cancer Paternal Aunt 3   Arthritis Maternal Grandmother    Diabetes Maternal Grandmother    Hyperlipidemia Maternal Grandmother    Colon cancer Maternal Grandfather        dx 4, dx 84   Alcohol abuse Paternal Grandfather    Arthritis Paternal Grandfather    Cirrhosis  Paternal Grandfather    Depression Daughter  Hypertension Daughter    Mental illness Daughter    Colon polyps Daughter    Depression Son      GENETIC COUNSELING/TESTING: See above  SOCIAL HISTORY:  Social History   Socioeconomic History   Marital status: Widowed    Spouse name: Not on file   Number of children: 3   Years of education: Not on file   Highest education level: Not on file  Occupational History   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    Drug use: Never   Sexual activity: Not on file  Other Topics Concern   Not on file  Social History Narrative   Not on file   Social Determinants of Health   Financial Resource Strain: Low Risk  (10/04/2021)   Overall Financial Resource Strain (CARDIA)    Difficulty of Paying Living Expenses: Not hard at all  Food Insecurity: No Food Insecurity (08/17/2021)   Hunger Vital Sign    Worried About Running Out of Food in the Last Year: Never true    Ran Out of Food in the Last Year: Never true  Transportation Needs: No Transportation Needs (08/17/2021)   PRAPARE - Administrator, Civil Service (Medical): No    Lack of Transportation (Non-Medical): No  Physical Activity: Inactive (08/17/2021)   Exercise Vital Sign    Days of Exercise per Week: 0 days    Minutes of Exercise per Session: 0 min  Stress: No Stress Concern Present (08/17/2021)   [employer] of Occupational Health - Occupational Stress Questionnaire    Feeling of Stress : Not at all  Social Connections: Unknown (12/17/2021)   Received from [facility]   Social Network    Social Network: Not on file  Intimate Partner Violence: Unknown (12/17/2021)   Received from [facility], [facility]   HITS    Physically Hurt: Not on file    Insult or Talk Down To: Not on file    Threaten Physical Harm: Not on file    Scream or Curse: Not on file     OBSERVATIONS/OBJECTIVE:  BP (!) 147/49 (BP Location: Left Arm, Patient Position: Sitting)   Pulse 93   Temp (!) 97.5 F (36.4 C) (Temporal)   Resp 18   Wt 261 lb 6.4 oz (118.6 kg)   SpO2 96%   BMI 46.30 kg/m   Physical Exam Constitutional:      Appearance: Normal appearance.  Chest:     Comments: Bilateral breast inspected and palpated.  Postsurgical changes noted.  No palpable masses or regional adenopathy. Musculoskeletal:        General: No swelling.     Cervical back: Normal range of motion and neck supple. No rigidity.  Lymphadenopathy:     Cervical: No cervical adenopathy.  Neurological:     Mental Status: She is alert.      LABORATORY DATA:  None for this visit.  DIAGNOSTIC IMAGING:  None for this visit.  ASSESSMENT AND PLAN:   Ms.. Divita is a pleasant 82 y.o. female with Stage IA right breast invasive ductal carcinoma, ER+/PR+/HER2-, diagnosed in 01/2021, treated with lumpectomy, and anti-estrogen therapy with Tamoxifen beginning in 04/2021.    Breast Cancer Surveillance No new breast masses on self-exam or clinical breast exam. On Tamoxifen with no reported side effects. -Order mammogram at [facility] after January 12, 2024.  Patient has claustrophobia and cannot take the stairs hence prefers to go to facilities which have mammogram on the first floor.  Constipation New onset, possibly related to medication side effects. No other changes in bowel habits. -Consider dietary modifications and over-the-counter remedies as needed.  General Health Maintenance -Continue regular self-breast exams. -Continue Tamoxifen as  prescribed. -Encourage regular exercise and healthy diet. RTC in 1 yr.

## 2023-06-24 NOTE — Progress Notes (Signed)
  Office: [phone]  /  Fax: [phone]    Date: June 24, 2023  Appointment Start Time: 11:31am Duration: 23 minutes Provider: Lawerance Cruel, Psy.D. Type of Session: Individual Therapy  Location of Patient: Home (private location) Location of Provider: Provider's Home (private office) Type of Contact: Telepsychological Visit via MyChart Video Visit  Session Content: Kristi Hunter is a 82 y.o. female presenting for a follow-up appointment to address the previously established treatment goal of increasing coping skills.Today's appointment was a telepsychological visit. Kristi Hunter provided verbal consent for today's telepsychological appointment and she is aware she is responsible for securing confidentiality on her end of the session. Prior to proceeding with today's appointment, Kristi Hunter's physical location at the time of this appointment was obtained as well a phone number she could be reached at in the event of technical difficulties. Kristi Hunter and this provider participated in today's telepsychological service.   This provider conducted a brief check-in. Kristi Hunter discussed reviewing emotional and physical hunger. She noticed she engages in mindless eating. Psychoeducation provided regarding mindful eating strategies (sit down, slowly chew, savor, simplify, and smile). Overall,  Kristi Hunter was receptive to today's appointment as evidenced by openness to sharing, responsiveness to feedback, and willingness to implement discussed strategies .  Mental Status Examination:  Appearance: neat Behavior: appropriate to circumstances Mood: neutral Affect: mood congruent Speech: WNL Eye Contact: appropriate Psychomotor Activity: WNL Gait: unable to assess Thought Process: linear, logical, and goal directed and no evidence or endorsement of suicidal, homicidal, and self-harm ideation, plan and intent  Thought Content/Perception: no hallucinations, delusions, bizarre thinking or behavior endorsed  or observed Orientation: AAOx4 Memory/Concentration: intact Insight: fair Judgment: fair  Interventions:  Conducted a brief chart review Provided empathic reflections and validation Reviewed content from the previous session Provided positive reinforcement Employed supportive psychotherapy interventions to facilitate reduced distress and to improve coping skills with identified stressors Psychoeducation provided regarding mindfulness as it relates to eating  DSM-5 Diagnosis(es): F50.89 Other Specified Feeding or Eating Disorder, Emotional Eating Behaviors  Treatment Goal & Progress: During the initial appointment with this provider, the following treatment goal was established: increase coping skills. Kristi Hunter has demonstrated progress in her goal as evidenced by increased awareness of hunger patterns.   Plan: The next appointment is scheduled for 07/28/2023 at 9am, which will be via MyChart Video Visit. The next session will focus on working towards the established treatment goal.

## 2023-06-26 ENCOUNTER — Other Ambulatory Visit: Payer: Self-pay | Admitting: Family Medicine

## 2023-06-26 NOTE — Telephone Encounter (Signed)
Rx approved for 90 day supply per Dr. Zollie Pee standing orders

## 2023-07-02 ENCOUNTER — Encounter: Payer: Self-pay | Admitting: Internal Medicine

## 2023-07-02 ENCOUNTER — Ambulatory Visit: Payer: Medicare HMO | Attending: Internal Medicine | Admitting: Internal Medicine

## 2023-07-02 VITALS — BP 130/64 | HR 84 | Ht 63.0 in | Wt 257.0 lb

## 2023-07-02 DIAGNOSIS — R0609 Other forms of dyspnea: Secondary | ICD-10-CM | POA: Diagnosis not present

## 2023-07-02 NOTE — Patient Instructions (Addendum)
Medication Instructions:  No changes  *If you need a refill on your cardiac medications before your next appointment, please call your pharmacy*   Lab Work: BNP  If you have labs (blood work) drawn today and your tests are completely normal, you will receive your results only by: MyChart Message (if you have MyChart) OR A paper copy in the mail If you have any lab test that is abnormal or we need to change your treatment, we will call you to review the results.   Testing/Procedures: Your physician has requested that you have a lexiscan myoview. For further information please visit [email or web address] Please follow instruction sheet, as given.   The test will take approximately 3 to 4 hours to complete; you may bring reading material.  If someone comes with you to your appointment, they will need to remain in the main lobby due to limited space in the testing area. **If you are pregnant or breastfeeding, please notify the nuclear lab prior to your appointment**  How to prepare for your Myocardial Perfusion Test: Do not eat or drink 3 hours prior to your test, except you may have water. Do not consume products containing caffeine (regular or decaffeinated) 12 hours prior to your test. (ex: coffee, chocolate, sodas, tea). Do bring a list of your current medications with you.  If not listed below, you may take your medications as normal. Do wear comfortable clothes (no dresses or overalls) and walking shoes, tennis shoes preferred (No heels or open toe shoes are allowed). Do NOT wear cologne, perfume, aftershave, or lotions (deodorant is allowed). If these instructions are not followed, your test will have to be rescheduled.    Follow-Up: At [facility], you and your health needs are our priority.  As part of our continuing mission to provide you with exceptional heart care, we have created designated Provider Care Teams.  These Care Teams include your primary Cardiologist  (physician) and Advanced Practice Providers (APPs -  Physician Assistants and Nurse Practitioners) who all work together to provide you with the care you need, when you need it.  We recommend signing up for the patient portal called "MyChart".  Sign up information is provided on this After Visit Summary.  MyChart is used to connect with patients for Virtual Visits (Telemedicine).  Patients are able to view lab/test results, encounter notes, upcoming appointments, etc.  Non-urgent messages can be sent to your provider as well.   To learn more about what you can do with MyChart, go to [email or web address].    Your next appointment:   Follow up as needed   Provider:   Dr.Mary Wyline Mood

## 2023-07-02 NOTE — Progress Notes (Signed)
Cardiology Office Note:    Date:  07/02/2023   ID:  Kristi Hunter, DOB [birth year], MRN [id number]  PCP:  Nadara Eaton, MD   [facility] Providers Cardiologist:  None     Referring MD: Nadara Eaton, MD   No chief complaint on file. LE edema  History of Present Illness:    Kristi Hunter is a 82 y.o. female with a hx of anxiety, obesity, breast cancer 02/2021, , bronchitis, HTN, OSA referral from Dr. Dalbert Garnet from [facility]. The referral ICD-10 code was BL LE edema.  She saw a cardiologist in 2017 for CP.  She had a stress test in [place]. This was normal. No hx of heart failure. She has had issues with LE edema. Tried compression stockings. After raising her legs edema improves. She denies angina,  lower extremity edema, PND or orthopnea.  She does go to the gym; she lives in independent living. She can walk for about a block and has had long term shortness of breath. Since COVID she has done limited activity. She has knee pain. She likes to eat bagels, cookies and ice cream.   Interim hx 07/02/2023 She noted DOE last visit. She had an echo that was unremarkable.  She notes that she is continuously short of breath and hoping to try to identify the precise cause.   Current Medications: Current Outpatient Medications on File Prior to Visit  Medication Sig Dispense Refill   acetaminophen (TYLENOL) 650 MG CR tablet Take 650 mg by mouth every 8 (eight) hours as needed for pain. Takes usually once a day     alendronate (FOSAMAX) 70 MG tablet TAKE 1 TABLET (70 MG TOTAL) BY MOUTH EVERY 7 (SEVEN) DAYS. TAKE WITH A FULL GLASS OF WATER ON AN EMPTY STOMACH. 12 tablet 3   calcium carbonate (OS-CAL) 600 MG TABS tablet Take 1 tablet (600 mg total) by mouth 2 (two) times daily with a meal. (Patient taking differently: Take 600 mg by mouth as needed.) 180 tablet 4   chlorthalidone (HYGROTON) 25 MG tablet Take 1 tablet (25 mg total) by mouth daily. 30 tablet 0    diclofenac Sodium (VOLTAREN) 1 % GEL Apply 2 g topically daily as needed (Osteoarthritis).     fluticasone (FLONASE) 50 MCG/ACT nasal spray Place 2 sprays into both nostrils in the morning and at bedtime. 16 g 6   furosemide (LASIX) 20 MG tablet Take 1 tablet (20 mg total) by mouth daily. 90 tablet 3   Melatonin 10 MG TABS Take 10 mg by mouth at bedtime as needed (sleep).     metFORMIN (GLUCOPHAGE) 1000 MG tablet Take 1 tablet (1,000 mg total) by mouth 2 (two) times daily with a meal. 60 tablet 0   metroNIDAZOLE (METROGEL) 1 % gel Apply topically daily. (Patient taking differently: Apply 1 application  topically daily as needed (rosacea).) 60 g 1   Multiple Vitamin (MULTIVITAMIN) tablet Take 1 tablet by mouth daily.     naproxen (NAPROSYN) 500 MG tablet Take 1 tablet (500 mg total) by mouth 2 (two) times daily with a meal. 10 tablet 0   tamoxifen (NOLVADEX) 20 MG tablet TAKE 1 TABLET BY MOUTH EVERY DAY 90 tablet 3   tiZANidine (ZANAFLEX) 2 MG tablet TAKE 1-2 TABLETS (2-4 MG TOTAL) BY MOUTH EVERY 8 (EIGHT) HOURS AS NEEDED. 540 tablet 1   No current facility-administered medications on file prior to visit.    ROS:   Please see the history of present  illness.     All other systems reviewed and are negative.  EKGs/Labs/Other Studies Reviewed:    The following studies were reviewed today:   Recent Labs: 08/23/2022: TSH 3.10 03/18/2023: Hemoglobin 12.1; Platelets 279 06/17/2023: ALT 16; BUN 18; Creatinine, Ser 0.60; Magnesium 1.7; Potassium 3.5; Sodium 144   Recent Lipid Panel    Component Value Date/Time   CHOL 161 08/23/2022 0000   CHOL 159 02/20/2022 0942   TRIG 58 08/23/2022 0000   HDL 64 08/23/2022 0000   HDL 49 02/20/2022 0942   CHOLHDL 3 01/13/2019 0913   VLDL 14.4 01/13/2019 0913   LDLCALC 84 08/23/2022 0000   LDLCALC 98 02/20/2022 0942     Risk Assessment/Calculations:     Physical Exam:    VS:   Vitals:   07/02/23 1614  BP: 130/64  Pulse: 84  SpO2: 94%      Wt Readings from Last 3 Encounters:  06/24/23 261 lb 6.4 oz (118.6 kg)  06/17/23 255 lb (115.7 kg)  06/11/23 259 lb (117.5 kg)     GEN:  Well nourished, well developed in no acute distress HEENT: Normal NECK: No JVD; No carotid bruits LYMPHATICS: No lymphadenopathy CARDIAC: RRR, no murmurs, rubs, gallops RESPIRATORY:  Clear to auscultation without rales, wheezing or rhonchi  ABDOMEN: Soft, non-tender, non-distended MUSCULOSKELETAL:  No edema; No deformity  SKIN: Warm and dry NEUROLOGIC:  Alert and oriented x 3 PSYCHIATRIC:  Normal affect   ASSESSMENT:   DOE Multifactorial. Her echo showed normal heart function and E/e' <14. Normal LA size. No signs of elevated LVEDP. Ordered BNP but was not done. So far, I do not suspect heart failure. She notes some left-sided chest pain that travels down the L arm too; will get a stress test to ensure this is not in the setting of obstructive coronary disease.  She is obese and is recommended to lose 40 pounds; her BMI is 45 and this is contributing. -see below  Dependant Edema. - continue lasix 20 mg PRN   PLAN:    In order of problems listed above:  Lexiscan SPECT BNP Referral to pulmonology for PFTs Follow up pending results     Medication Adjustments/Labs and Tests Ordered: Current medicines are reviewed at length with the patient today.  Concerns regarding medicines are outlined above.  No orders of the defined types were placed in this encounter.  No orders of the defined types were placed in this encounter.   There are no Patient Instructions on file for this visit.   Signed, Maisie Fus, MD  07/02/2023 2:10 PM    [facility]

## 2023-07-03 ENCOUNTER — Telehealth: Payer: Self-pay

## 2023-07-03 NOTE — Telephone Encounter (Signed)
Patient was called right after 8:00 am and again at 10:20 am.  Lft message of her about the Lexi 2Day tests dates. 07-03-23 VB

## 2023-07-08 ENCOUNTER — Telehealth (HOSPITAL_COMMUNITY): Payer: Self-pay | Admitting: *Deleted

## 2023-07-08 NOTE — Telephone Encounter (Signed)
Left detailed instructions for MPI study. 

## 2023-07-09 DIAGNOSIS — N3946 Mixed incontinence: Secondary | ICD-10-CM | POA: Diagnosis not present

## 2023-07-09 DIAGNOSIS — R351 Nocturia: Secondary | ICD-10-CM | POA: Diagnosis not present

## 2023-07-09 DIAGNOSIS — R350 Frequency of micturition: Secondary | ICD-10-CM | POA: Diagnosis not present

## 2023-07-14 ENCOUNTER — Ambulatory Visit (HOSPITAL_COMMUNITY): Payer: Medicare HMO | Attending: Cardiovascular Disease

## 2023-07-14 DIAGNOSIS — R0609 Other forms of dyspnea: Secondary | ICD-10-CM | POA: Diagnosis not present

## 2023-07-14 MED ORDER — TECHNETIUM TC 99M TETROFOSMIN IV KIT
32.4000 | PACK | Freq: Once | INTRAVENOUS | Status: AC | PRN
Start: 2023-07-14 — End: 2023-07-14
  Administered 2023-07-14: 32.4 via INTRAVENOUS

## 2023-07-14 MED ORDER — REGADENOSON 0.4 MG/5ML IV SOLN
0.4000 mg | Freq: Once | INTRAVENOUS | Status: AC
  Administered 2023-07-14: 0.4 mg via INTRAVENOUS

## 2023-07-15 ENCOUNTER — Ambulatory Visit (HOSPITAL_COMMUNITY): Payer: Medicare HMO | Attending: Cardiology

## 2023-07-15 LAB — MYOCARDIAL PERFUSION IMAGING
LV dias vol: 49 mL (ref 46–106)
LV sys vol: 15 mL
Nuc Stress EF: 68 %
Peak HR: 106 {beats}/min
Rest HR: 85 {beats}/min
SDS: 3
SRS: 3
SSS: 6
ST Depression (mm): 0 mm
Stress Nuclear Isotope Dose: 32.4 mCi
TID: 0.95

## 2023-07-15 MED ORDER — TECHNETIUM TC 99M TETROFOSMIN IV KIT
30.6000 | PACK | Freq: Once | INTRAVENOUS | Status: AC | PRN
  Administered 2023-07-15: 30.6 via INTRAVENOUS

## 2023-07-17 DIAGNOSIS — R531 Weakness: Secondary | ICD-10-CM | POA: Diagnosis not present

## 2023-07-17 DIAGNOSIS — I10 Essential (primary) hypertension: Secondary | ICD-10-CM | POA: Diagnosis not present

## 2023-07-17 DIAGNOSIS — R0600 Dyspnea, unspecified: Secondary | ICD-10-CM | POA: Diagnosis not present

## 2023-07-17 DIAGNOSIS — R0602 Shortness of breath: Secondary | ICD-10-CM | POA: Diagnosis not present

## 2023-07-28 ENCOUNTER — Telehealth (INDEPENDENT_AMBULATORY_CARE_PROVIDER_SITE_OTHER): Payer: Medicare HMO | Admitting: Psychology

## 2023-07-28 ENCOUNTER — Telehealth (INDEPENDENT_AMBULATORY_CARE_PROVIDER_SITE_OTHER): Payer: Self-pay | Admitting: Psychology

## 2023-07-28 NOTE — Progress Notes (Unsigned)
  Office: [phone]  /  Fax: [phone]    Date: July 28, 2023  Appointment Start Time: *** Duration: *** minutes Provider: Lawerance Cruel, Psy.D. Type of Session: Individual Therapy  Location of Patient: {gbptloc:23249} (private location) Location of Provider: Provider's Home (private office) Type of Contact: Telepsychological Visit via MyChart Video Visit  Session Content: This provider called [name] at 9:03am as she did not present for today's appointment. A HIPAA compliant voicemail was left requesting a call back.  As such, today's appointment was initiated *** minutes late.Kristi Hunter is a 82 y.o. female presenting for a follow-up appointment to address the previously established treatment goal of increasing coping skills.Today's appointment was a telepsychological visit. [name] provided verbal consent for today's telepsychological appointment and she is aware she is responsible for securing confidentiality on her end of the session. Prior to proceeding with today's appointment, [name]'s physical location at the time of this appointment was obtained as well a phone number she could be reached at in the event of technical difficulties. Yurika and this provider participated in today's telepsychological service.   This provider conducted a brief check-in. *** [name] was receptive to today's appointment as evidenced by openness to sharing, responsiveness to feedback, and {gbreceptiveness:23401}.  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 [name] has demonstrated progress in her goal as evidenced by {gbtxprogress:22839}. [name] also {gbtxprogress2:22951}.  Plan: The next appointment is scheduled for *** at ***, which will be via MyChart Video Visit. The next session will focus on {Plan for Next Appointment:23400}.

## 2023-07-28 NOTE — Telephone Encounter (Signed)
  Office: [phone]  /  Fax: [phone]  Date of Call: July 28, 2023  Time of Call: 9:03am Provider: Lawerance Cruel, PsyD  CONTENT: This provider called [name] to check-in as she did not present for today's MyChart Video Visit appointment. A HIPAA compliant voicemail was left requesting a call back. Of note, this provider stayed on the MyChart Video Visit appointment for 5 minutes prior to signing off per the clinic's grace period policy.    PLAN: This provider will wait for [name] to call back. No further follow-up planned by this provider.

## 2023-08-06 ENCOUNTER — Other Ambulatory Visit (INDEPENDENT_AMBULATORY_CARE_PROVIDER_SITE_OTHER): Payer: Self-pay | Admitting: Family Medicine

## 2023-08-06 DIAGNOSIS — R7303 Prediabetes: Secondary | ICD-10-CM

## 2023-08-07 ENCOUNTER — Ambulatory Visit (INDEPENDENT_AMBULATORY_CARE_PROVIDER_SITE_OTHER): Payer: Medicare HMO | Admitting: Family Medicine

## 2023-08-07 ENCOUNTER — Encounter (INDEPENDENT_AMBULATORY_CARE_PROVIDER_SITE_OTHER): Payer: Self-pay | Admitting: Family Medicine

## 2023-08-07 VITALS — BP 136/78 | HR 87 | Ht 63.0 in | Wt 251.0 lb

## 2023-08-07 DIAGNOSIS — R600 Localized edema: Secondary | ICD-10-CM | POA: Diagnosis not present

## 2023-08-07 DIAGNOSIS — E669 Obesity, unspecified: Secondary | ICD-10-CM

## 2023-08-07 DIAGNOSIS — R7303 Prediabetes: Secondary | ICD-10-CM

## 2023-08-07 DIAGNOSIS — Z6841 Body Mass Index (BMI) 40.0 and over, adult: Secondary | ICD-10-CM

## 2023-08-07 MED ORDER — METFORMIN HCL 500 MG PO TABS: 500.0000 mg | ORAL_TABLET | Freq: Three times a day (TID) | ORAL | 0 refills | Status: DC

## 2023-08-07 NOTE — Progress Notes (Signed)
.smr  Office: [phone]  /  Fax: [phone]  WEIGHT SUMMARY AND BIOMETRICS  Anthropometric Measurements Height: 5\' 3"  (1.6 m) Weight: 251 lb (113.9 kg) BMI (Calculated): 44.47 Weight at Last Visit: 255 lb Weight Lost Since Last Visit: 4 lb Weight Gained Since Last Visit: 0 Starting Weight: 267 lb Total Weight Loss (lbs): 16 lb (7.258 kg)   Body Composition  Body Fat %: 58.4 % Fat Mass (lbs): 147 lbs Muscle Mass (lbs): 99.2 lbs Visceral Fat Rating : 22   Other Clinical Data Fasting: No Labs: No Today's Visit #: 40 Starting Date: 02/22/20    Chief Complaint: OBESITY   History of Present Illness   The patient, with a history of prediabetes and obesity, has been following a low carbohydrate diet with moderate success, achieving a weight loss of four pounds over the past two months. However, she reports difficulty in maintaining an exercise regimen due to neck pain. She is currently on metformin for prediabetes management and has requested a refill.  The patient has been taking metformin in two different doses, 1000mg  in the morning and 500mg  in the afternoon. She has found this regimen to be effective and tolerable.  In addition to her prediabetes and obesity, the patient has noted increased urinary frequency, waking up three times a night to urinate. This has been causing her significant frustration.  The patient also reports a long-standing skin condition, characterized by persistent redness and occasional itchiness, which she attributes to a past exposure to poison sumac or poison oak.  Recently, she has noticed swelling in one of her legs. Despite being on furosemide, prescribed by her cardiologist, and maintaining a low-salt diet, the swelling persists.  The patient is planning a trip to [place], during which she hopes to increase her physical activity. She has a system for alternating driving duties with her daughter during the trip, ensuring regular breaks for  movement and stretching.  Despite the challenges, the patient remains hopeful and committed to improving her health, with a goal of further weight loss in the coming year.          PHYSICAL EXAM:  Blood pressure 136/78, pulse 87, height 5\' 3"  (1.6 m), weight 251 lb (113.9 kg), SpO2 95%. Body mass index is 44.46 kg/m.  DIAGNOSTIC DATA REVIEWED:  BMET    Component Value Date/Time   NA 144 06/17/2023 1105   K 3.5 06/17/2023 1105   CL 100 06/17/2023 1105   CO2 31 (H) 06/17/2023 1105   GLUCOSE 103 (H) 06/17/2023 1105   GLUCOSE 102 (H) 03/18/2023 0845   BUN 18 06/17/2023 1105   CREATININE 0.60 06/17/2023 1105   CREATININE 0.76 12/11/2021 1226   CALCIUM 9.2 06/17/2023 1105   GFRNONAA >60 03/18/2023 0845   GFRNONAA >60 12/11/2021 1226   GFRAA 104 02/22/2020 1339   Lab Results  Component Value Date   HGBA1C 6.1 (H) 06/17/2023   HGBA1C 6.0 01/13/2019   Lab Results  Component Value Date   INSULIN 22.5 02/20/2022   INSULIN 21.5 02/22/2020   Lab Results  Component Value Date   TSH 3.10 08/23/2022   CBC    Component Value Date/Time   WBC 7.5 03/18/2023 0845   RBC 4.29 03/18/2023 0845   HGB 12.1 03/18/2023 0845   HGB 12.6 02/20/2022 0942   HCT 38.1 03/18/2023 0845   HCT 39.6 02/20/2022 0942   PLT 279 03/18/2023 0845   PLT 318 02/20/2022 0942   MCV 88.8 03/18/2023 0845   MCV 87 02/20/2022  0942   MCH 28.2 03/18/2023 0845   MCHC 31.8 03/18/2023 0845   RDW 13.4 03/18/2023 0845   RDW 13.0 02/20/2022 0942   Iron Studies No results found for: "IRON", "TIBC", "FERRITIN", "IRONPCTSAT" Lipid Panel     Component Value Date/Time   CHOL 161 08/23/2022 0000   CHOL 159 02/20/2022 0942   TRIG 58 08/23/2022 0000   HDL 64 08/23/2022 0000   HDL 49 02/20/2022 0942   CHOLHDL 3 01/13/2019 0913   VLDL 14.4 01/13/2019 0913   LDLCALC 84 08/23/2022 0000   LDLCALC 98 02/20/2022 0942   Hepatic Function Panel     Component Value Date/Time   PROT 6.4 06/17/2023 1105   ALBUMIN  3.6 (L) 06/17/2023 1105   AST 18 06/17/2023 1105   AST 19 12/11/2021 1226   ALT 16 06/17/2023 1105   ALT 17 12/11/2021 1226   ALKPHOS 59 06/17/2023 1105   BILITOT <0.2 06/17/2023 1105   BILITOT 0.3 12/11/2021 1226   BILIDIR 0.1 03/15/2022 1149      Component Value Date/Time   TSH 3.10 08/23/2022 0000   TSH 1.470 02/22/2020 1339   Nutritional Lab Results  Component Value Date   VD25OH 30.9 06/17/2023   VD25OH 32.7 02/20/2022   VD25OH 35.1 12/25/2021     Assessment and Plan    Prediabetes Prediabetes managed with metformin and lifestyle modifications. Adheres to a low-carb diet 50% of the time, resulting in a 4-pound weight loss over two months. Unable to exercise due to neck pain. Prefers metformin 500 mg due to tolerability issues with higher doses. - Refill metformin 500 mg, three times a day - Follow up in two months  Obesity Obesity with a recent 4-pound weight loss over two months. Following a low-carb diet and plans to increase exercise while in [place]. Plans to support each other with daughter who is also dieting. - Continue low-carb diet - Encourage regular exercise while in [place] -Minimize eating out   B LE Edema Peripheral edema likely related to dietary salt intake, managed with furosemide. Reports one leg is swollen today. Advised on low-salt diet and monitoring swelling. - Continue furosemide as prescribed - Advise on low-salt diet - Monitor for changes in swelling  Follow-up - Schedule follow-up appointment for February 25th.          She was informed of the importance of frequent follow up visits to maximize her success with intensive lifestyle modifications for her multiple health conditions.    Quillian Quince, MD

## 2023-09-02 ENCOUNTER — Telehealth (INDEPENDENT_AMBULATORY_CARE_PROVIDER_SITE_OTHER): Payer: Medicare HMO | Admitting: Psychology

## 2023-09-02 DIAGNOSIS — F5089 Other specified eating disorder: Secondary | ICD-10-CM

## 2023-09-02 NOTE — Progress Notes (Signed)
  Office: [phone]  /  Fax: [phone]    Date: September 02, 2023  Appointment Start Time: 9:57am Duration: 31 minutes Provider: Lawerance Cruel, Psy.D. Type of Session: Individual Therapy  Location of Patient: Home (private location) Location of Provider: Provider's Home (private office) Type of Contact: Telepsychological Visit via MyChart Video Visit  Session Content: Kristi Hunter is a 83 y.o. female presenting for a follow-up appointment to address the previously established treatment goal of increasing coping skills.Today's appointment was a telepsychological visit. [name] provided verbal consent for today's telepsychological appointment and she is aware she is responsible for securing confidentiality on her end of the session. Prior to proceeding with today's appointment, [name]'s physical location at the time of this appointment was obtained as well a phone number she could be reached at in the event of technical difficulties. [name] and this provider participated in today's telepsychological service.   This provider conducted a brief check-in. [name] reported using discussed strategies, noting using her non-dominant hand really helped her slow down her eating resulting in weight loss. She also reported she will be traveling to [place] for a month. Psychoeducation regarding making better choices and engaging in portion control during the holidays/celebrations/vacations was provided. More specifically, this provider discussed the following strategies: coming to meals hungry, but not starving; avoid filling up on appetizers; managing portion sizes; not completely depriving yourself; making the plate colorful (e.g., vegetables); pacing yourself (e.g., waiting 10 minutes before going back for seconds); taking advantage of the nutritious foods; practicing mindfulness; staying hydrated; and avoid bringing home leftovers. Overall, [name] was receptive to today's appointment as evidenced  by openness to sharing, responsiveness to feedback, and willingness to implement discussed strategies .  Mental Status Examination:  Appearance: neat Behavior: appropriate to circumstances Mood: neutral Affect: mood congruent Speech: WNL Eye Contact: appropriate Psychomotor Activity: WNL Gait: unable to assess Thought Process: linear, logical, and goal directed and no evidence or endorsement of suicidal, homicidal, and self-harm ideation, plan and intent  Thought Content/Perception: no hallucinations, delusions, bizarre thinking or behavior endorsed or observed Orientation: AAOx4 Memory/Concentration: intact Insight: good Judgment: good  Interventions:  Conducted a brief chart review Provided empathic reflections and validation Reviewed content from the previous session Provided positive reinforcement Employed supportive psychotherapy interventions to facilitate reduced distress and to improve coping skills with identified stressors Psychoeducation provided regarding strategies for celebrations/holidays/vacations  DSM-5 Diagnosis(es): F50.89 Other Specified Feeding or Eating Disorder, Emotional Eating Behaviors  Treatment Goal & Progress: [name] re-established care with this provider and following treatment goal was established: increase coping skills. [name] has demonstrated progress in her goal as evidenced by increased awareness of hunger patterns. [name] also continues to demonstrate willingness to engage in learned skill(s).  Plan: The next appointment is scheduled for 10/13/2023 at 8:30am, which will be via MyChart Video Visit. The next session will focus on working towards the established treatment goal.

## 2023-10-07 ENCOUNTER — Encounter (INDEPENDENT_AMBULATORY_CARE_PROVIDER_SITE_OTHER): Payer: Self-pay | Admitting: Family Medicine

## 2023-10-07 ENCOUNTER — Ambulatory Visit (INDEPENDENT_AMBULATORY_CARE_PROVIDER_SITE_OTHER): Payer: Medicare HMO | Admitting: Family Medicine

## 2023-10-07 VITALS — BP 122/76 | HR 91 | Temp 97.6°F | Ht 61.0 in | Wt 252.0 lb

## 2023-10-07 DIAGNOSIS — F338 Other recurrent depressive disorders: Secondary | ICD-10-CM

## 2023-10-07 DIAGNOSIS — R7303 Prediabetes: Secondary | ICD-10-CM

## 2023-10-07 DIAGNOSIS — E669 Obesity, unspecified: Secondary | ICD-10-CM

## 2023-10-07 DIAGNOSIS — Z6841 Body Mass Index (BMI) 40.0 and over, adult: Secondary | ICD-10-CM

## 2023-10-07 MED ORDER — METFORMIN HCL 500 MG PO TABS: 500.0000 mg | ORAL_TABLET | Freq: Three times a day (TID) | ORAL | 0 refills | Status: DC

## 2023-10-07 NOTE — Progress Notes (Signed)
 .smr  Office: [phone]  /  Fax: [phone]  WEIGHT SUMMARY AND BIOMETRICS  Anthropometric Measurements Height: 5\' 1"  (1.549 m) Weight: 252 lb (114.3 kg) BMI (Calculated): 47.64 Weight at Last Visit: 251 lb Weight Lost Since Last Visit: 0 Weight Gained Since Last Visit: 1 lb Starting Weight: 267 lb Total Weight Loss (lbs): 16 lb (7.258 kg) Peak Weight: 280 lb   Body Composition  Body Fat %: 60.9 % Fat Mass (lbs): 154 lbs Muscle Mass (lbs): 93.8 lbs Visceral Fat Rating : 25   Other Clinical Data Fasting: no Labs: no Today's Visit #: 41 Starting Date: 02/22/20    Chief Complaint: OBESITY   History of Present Illness   Kristi Allcorn "[name]" is an 83 year old female with prediabetes who presents for follow-up on her weight management and prediabetes.  She has a history of prediabetes and is currently taking metformin 500 mg three times a day. She has been attempting to manage her condition by reducing carbohydrate intake and engaging in regular exercise. Despite these efforts, she has gained one pound since her last visit two months ago. She adheres to a low carbohydrate diet about 60% of the time and exercises in the pool for 60 minutes, five times a week. Her current diet includes a bagel or English muffin for breakfast, often with peanut butter or cream cheese, and she has switched to keto bread for other meals. She also consumes a glass of wine occasionally. She plans to continue her exercise routine, including pool exercises at least twice a week and using exercise equipment like a new step machine four to five times a week. She also participates in exercise classes such as tai chi, balance, and weights on a chair.  She recently returned from a month-long vacation in [place], where she experienced seasonal affective disorder due to lack of sunshine before the trip. She felt significantly better while in [place].  She experiences arthritis symptoms, particularly in  her knees and hands, which are exacerbated by cold weather. She reports improvement in symptoms with warmer weather experienced in [place].  She mentioned experiencing symptoms suggestive of a urinary tract infection but noted that these symptoms seem to be resolving without the use of antibiotics.          PHYSICAL EXAM:  Blood pressure 122/76, pulse 91, temperature 97.6 F (36.4 C), height 5\' 1"  (1.549 m), weight 252 lb (114.3 kg), SpO2 93%. Body mass index is 47.61 kg/m.  DIAGNOSTIC DATA REVIEWED:  BMET    Component Value Date/Time   NA 144 06/17/2023 1105   K 3.5 06/17/2023 1105   CL 100 06/17/2023 1105   CO2 31 (H) 06/17/2023 1105   GLUCOSE 103 (H) 06/17/2023 1105   GLUCOSE 102 (H) 03/18/2023 0845   BUN 18 06/17/2023 1105   CREATININE 0.60 06/17/2023 1105   CREATININE 0.76 12/11/2021 1226   CALCIUM 9.2 06/17/2023 1105   GFRNONAA >60 03/18/2023 0845   GFRNONAA >60 12/11/2021 1226   GFRAA 104 02/22/2020 1339   Lab Results  Component Value Date   HGBA1C 6.1 (H) 06/17/2023   HGBA1C 6.0 01/13/2019   Lab Results  Component Value Date   INSULIN 22.5 02/20/2022   INSULIN 21.5 02/22/2020   Lab Results  Component Value Date   TSH 3.10 08/23/2022   CBC    Component Value Date/Time   WBC 7.5 03/18/2023 0845   RBC 4.29 03/18/2023 0845   HGB 12.1 03/18/2023 0845   HGB 12.6 02/20/2022 0942  HCT 38.1 03/18/2023 0845   HCT 39.6 02/20/2022 0942   PLT 279 03/18/2023 0845   PLT 318 02/20/2022 0942   MCV 88.8 03/18/2023 0845   MCV 87 02/20/2022 0942   MCH 28.2 03/18/2023 0845   MCHC 31.8 03/18/2023 0845   RDW 13.4 03/18/2023 0845   RDW 13.0 02/20/2022 0942   Iron Studies No results found for: "IRON", "TIBC", "FERRITIN", "IRONPCTSAT" Lipid Panel     Component Value Date/Time   CHOL 161 08/23/2022 0000   CHOL 159 02/20/2022 0942   TRIG 58 08/23/2022 0000   HDL 64 08/23/2022 0000   HDL 49 02/20/2022 0942   CHOLHDL 3 01/13/2019 0913   VLDL 14.4 01/13/2019 0913    LDLCALC 84 08/23/2022 0000   LDLCALC 98 02/20/2022 0942   Hepatic Function Panel     Component Value Date/Time   PROT 6.4 06/17/2023 1105   ALBUMIN 3.6 (L) 06/17/2023 1105   AST 18 06/17/2023 1105   AST 19 12/11/2021 1226   ALT 16 06/17/2023 1105   ALT 17 12/11/2021 1226   ALKPHOS 59 06/17/2023 1105   BILITOT <0.2 06/17/2023 1105   BILITOT 0.3 12/11/2021 1226   BILIDIR 0.1 03/15/2022 1149      Component Value Date/Time   TSH 3.10 08/23/2022 0000   TSH 1.470 02/22/2020 1339   Nutritional Lab Results  Component Value Date   VD25OH 30.9 06/17/2023   VD25OH 32.7 02/20/2022   VD25OH 35.1 12/25/2021     Assessment and Plan    Prediabetes and Obesity Currently on metformin 500 mg three times a day. Efforts to lose weight through portion control and regular exercise. Gained one pound over the last two months, likely due to fluid retention during travel. No issues with metformin. - Continue metformin 500 mg three times a day - Encourage portion control and smart dietary choices - Encourage regular exercise, including pool exercises and equipment use at least four times a week - Refill metformin prescription at [facility] in [place]  Seasonal Affective Disorder (SAD) Symptoms of depression during winter months, improved significantly during recent stay in [place]. Diagnosis of Seasonal Affective Disorder likely. - Consider light therapy or increased exposure to natural sunlight during winter months - Monitor mood and symptoms, especially during winter  General Health Maintenance Ensuring protein at every meal and considering keto-friendly products. - Encourage protein intake at every meal - Monitor weight and dietary habits  Follow-up - Schedule follow-up visit in two months - Contact if any issues arise before the next scheduled visit.         I have personally spent 40 minutes total time today in preparation, patient care, and documentation for this visit, including the  following: review of clinical lab tests; review of medical tests/procedures/services.    She was informed of the importance of frequent follow up visits to maximize her success with intensive lifestyle modifications for her multiple health conditions.    Quillian Quince, MD

## 2023-10-08 DIAGNOSIS — M1712 Unilateral primary osteoarthritis, left knee: Secondary | ICD-10-CM | POA: Diagnosis not present

## 2023-10-08 DIAGNOSIS — Z6841 Body Mass Index (BMI) 40.0 and over, adult: Secondary | ICD-10-CM | POA: Diagnosis not present

## 2023-10-08 DIAGNOSIS — I10 Essential (primary) hypertension: Secondary | ICD-10-CM | POA: Diagnosis not present

## 2023-10-08 DIAGNOSIS — R0609 Other forms of dyspnea: Secondary | ICD-10-CM | POA: Diagnosis not present

## 2023-10-08 DIAGNOSIS — E88819 Insulin resistance, unspecified: Secondary | ICD-10-CM | POA: Diagnosis not present

## 2023-10-08 DIAGNOSIS — R351 Nocturia: Secondary | ICD-10-CM | POA: Diagnosis not present

## 2023-10-08 DIAGNOSIS — M810 Age-related osteoporosis without current pathological fracture: Secondary | ICD-10-CM | POA: Diagnosis not present

## 2023-10-08 DIAGNOSIS — G4733 Obstructive sleep apnea (adult) (pediatric): Secondary | ICD-10-CM | POA: Diagnosis not present

## 2023-10-08 DIAGNOSIS — R600 Localized edema: Secondary | ICD-10-CM | POA: Diagnosis not present

## 2023-10-09 DIAGNOSIS — E785 Hyperlipidemia, unspecified: Secondary | ICD-10-CM | POA: Diagnosis not present

## 2023-10-09 DIAGNOSIS — R7301 Impaired fasting glucose: Secondary | ICD-10-CM | POA: Diagnosis not present

## 2023-10-09 DIAGNOSIS — I10 Essential (primary) hypertension: Secondary | ICD-10-CM | POA: Diagnosis not present

## 2023-10-09 DIAGNOSIS — E559 Vitamin D deficiency, unspecified: Secondary | ICD-10-CM | POA: Diagnosis not present

## 2023-10-09 DIAGNOSIS — R531 Weakness: Secondary | ICD-10-CM | POA: Diagnosis not present

## 2023-10-09 DIAGNOSIS — E039 Hypothyroidism, unspecified: Secondary | ICD-10-CM | POA: Diagnosis not present

## 2023-10-13 ENCOUNTER — Telehealth (INDEPENDENT_AMBULATORY_CARE_PROVIDER_SITE_OTHER): Payer: Medicare HMO | Admitting: Psychology

## 2023-10-13 DIAGNOSIS — F5089 Other specified eating disorder: Secondary | ICD-10-CM | POA: Diagnosis not present

## 2023-10-13 NOTE — Progress Notes (Signed)
  Office: [phone]  /  Fax: [phone]    Date: October 13, 2023  Appointment Start Time: 8:32am Duration: 32 minutes Provider: Lawerance Cruel, Psy.D. Type of Session: Individual Therapy  Location of Patient: Home (private location) Location of Provider: Provider's Home (private office) Type of Contact: Telepsychological Visit via MyChart Video Visit  Session Content: Kristi Hunter is a 83 y.o. female presenting for a follow-up appointment to address the previously established treatment goal of increasing coping skills.Today's appointment was a telepsychological visit. Kristi Hunter provided verbal consent for today's telepsychological appointment and she is aware she is responsible for securing confidentiality on her end of the session. Prior to proceeding with today's appointment, [name]'s physical location at the time of this appointment was obtained as well a phone number she could be reached at in the event of technical difficulties. [name] and this provider participated in today's telepsychological service.   This provider conducted a brief check-in. [name] shared about her trip to [place], noting an improvement in mood. Discussed what went well as it relates to eating habits while on vacation and what she could do differently during a future vacation. She discussed her eating habits since returning home. [name] noted a plan to reduce bagel intake by replacing them with English muffins, and then ceasing intake of the English muffins. Explored other foods she could make transitions from to replace with healthier/protein options. [name] discussed a plan to increase fish intake and decrease ice cream by cutting portion by half. Overall, [name] was receptive to today's appointment as evidenced by openness to sharing, responsiveness to feedback, and willingness to implement discussed strategies .  Mental Status Examination:  Appearance: neat Behavior: appropriate to  circumstances Mood: neutral Affect: mood congruent Speech: WNL Eye Contact: appropriate Psychomotor Activity: WNL Gait: unable to assess Thought Process: linear, logical, and goal directed and no evidence or endorsement of suicidal, homicidal, and self-harm ideation, plan and intent  Thought Content/Perception: no hallucinations, delusions, bizarre thinking or behavior endorsed or observed Orientation: AAOx4 Memory/Concentration: intact Insight: good Judgment: good  Interventions:  Conducted a brief chart review Provided empathic reflections and validation Reviewed content from the previous session Provided positive reinforcement Employed supportive psychotherapy interventions to facilitate reduced distress and to improve coping skills with identified stressors Engaged patient in problem solving  DSM-5 Diagnosis(es): 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 [name] has demonstrated progress in her goal as evidenced by increased awareness of hunger patterns. [name] also continues to demonstrate willingness to engage in learned skill(s) (e.g., implementing vacation strategies; utilizing non-dominant hand to eat to slow down eating).   Plan: The next appointment is scheduled for 11/03/2023 at 10am, which will be via MyChart Video Visit. The next session will focus on working towards the established treatment goal.   Lawerance Cruel, PsyD

## 2023-10-15 DIAGNOSIS — N390 Urinary tract infection, site not specified: Secondary | ICD-10-CM | POA: Diagnosis not present

## 2023-10-15 DIAGNOSIS — R2681 Unsteadiness on feet: Secondary | ICD-10-CM | POA: Diagnosis not present

## 2023-10-15 DIAGNOSIS — M6281 Muscle weakness (generalized): Secondary | ICD-10-CM | POA: Diagnosis not present

## 2023-10-15 DIAGNOSIS — R2689 Other abnormalities of gait and mobility: Secondary | ICD-10-CM | POA: Diagnosis not present

## 2023-10-16 DIAGNOSIS — R350 Frequency of micturition: Secondary | ICD-10-CM | POA: Diagnosis not present

## 2023-10-16 DIAGNOSIS — N3946 Mixed incontinence: Secondary | ICD-10-CM | POA: Diagnosis not present

## 2023-10-22 ENCOUNTER — Telehealth: Payer: Self-pay | Admitting: Internal Medicine

## 2023-10-22 NOTE — Telephone Encounter (Signed)
 Patient identification verified by 2 forms. Marilynn Rail, RN    Called and spoke to patient  Patient states:   -she would like to know when her last EKG was   -she is completing paperwork for a surgery   -unsure if she will need cardiac preop  RN provided fax # [phone]), have fax sent to [facility] Preop  Patient verbalized understanding, no questions at this time

## 2023-10-22 NOTE — Telephone Encounter (Signed)
 Patient is calling to see if she can get an EKG done in office. Please call back

## 2023-11-03 ENCOUNTER — Telehealth (INDEPENDENT_AMBULATORY_CARE_PROVIDER_SITE_OTHER): Admitting: Psychology

## 2023-11-03 DIAGNOSIS — F5089 Other specified eating disorder: Secondary | ICD-10-CM

## 2023-11-03 NOTE — Progress Notes (Signed)
  Office: [phone]  /  Fax: [phone]    Date: November 03, 2023  Appointment Start Time: 10:02am Duration: 25 minutes Provider: Lawerance Cruel, Psy.D. Type of Session: Individual Therapy  Location of Patient: Home (private location) Location of Provider: Provider's Home (private office) Type of Contact: Telepsychological Visit via MyChart Video Visit  Session Content: Peni is a 83 y.o. female presenting for a follow-up appointment to address the previously established treatment goal of increasing coping skills.Today's appointment was a telepsychological visit. Zamaya provided verbal consent for today's telepsychological appointment and she is aware she is responsible for securing confidentiality on her end of the session. Prior to proceeding with today's appointment, Vy's physical location at the time of this appointment was obtained as well a phone number she could be reached at in the event of technical difficulties. Jenness and this provider participated in today's telepsychological service.   This provider conducted a brief check-in. Krissy shared about recent events, including her family visiting. She noted trying to implement previously discussed skills and increasing in physical activity. Regarding emotional eating behaviors, she described some reduction due to increased awareness. Psychoeducation regarding triggers for emotional eating was provided. Tahisha was provided a handout, and encouraged to utilize the handout between now and the next appointment to increase awareness of triggers and frequency. [name] agreed. This provider also discussed behavioral strategies for specific triggers, such as placing the utensil down when conversing to avoid mindless eating. Alayza provided verbal consent during today's appointment for this provider to send a handout about triggers via e-mail. Overall, Zenobia was receptive to today's appointment as evidenced by openness  to sharing, responsiveness to feedback, and willingness to explore triggers for emotional eating.  Mental Status Examination:  Appearance: neat Behavior: appropriate to circumstances Mood: neutral Affect: mood congruent Speech: WNL Eye Contact: appropriate Psychomotor Activity: WNL Gait: unable to assess Thought Process: linear, logical, and goal directed and no evidence or endorsement of suicidal, homicidal, and self-harm ideation, plan and intent  Thought Content/Perception: no hallucinations, delusions, bizarre thinking or behavior endorsed or observed Orientation: AAOx4 Memory/Concentration: intact Insight: good Judgment: good  Interventions:  Conducted a brief chart review Provided empathic reflections and validation Provided positive reinforcement Employed supportive psychotherapy interventions to facilitate reduced distress and to improve coping skills with identified stressors Psychoeducation provided regarding triggers for emotional eating behaviors  DSM-5 Diagnosis(es): F50.89 Other Specified Feeding or Eating Disorder, Emotional Eating Behaviors  Treatment Goal & Progress: Danijah re-established care with this provider and following treatment goal was established: increase coping skills. Enna has demonstrated progress in her goal as evidenced by increased awareness of hunger patterns. Jahleah also continues to demonstrate willingness to engage in learned skill(s) (e.g., implementing vacation strategies; utilizing non-dominant hand to eat to slow down eating).   Plan: The next appointment is scheduled for 12/01/2023 at 8:30am, which will be via MyChart Video Visit. The next session will focus on working towards the established treatment goal.   Lawerance Cruel, PsyD

## 2023-11-06 DIAGNOSIS — M1990 Unspecified osteoarthritis, unspecified site: Secondary | ICD-10-CM | POA: Diagnosis not present

## 2023-11-06 DIAGNOSIS — Z87891 Personal history of nicotine dependence: Secondary | ICD-10-CM | POA: Diagnosis not present

## 2023-11-06 DIAGNOSIS — K219 Gastro-esophageal reflux disease without esophagitis: Secondary | ICD-10-CM | POA: Diagnosis not present

## 2023-11-06 DIAGNOSIS — Z8249 Family history of ischemic heart disease and other diseases of the circulatory system: Secondary | ICD-10-CM | POA: Diagnosis not present

## 2023-11-06 DIAGNOSIS — J449 Chronic obstructive pulmonary disease, unspecified: Secondary | ICD-10-CM | POA: Diagnosis not present

## 2023-11-06 DIAGNOSIS — I509 Heart failure, unspecified: Secondary | ICD-10-CM | POA: Diagnosis not present

## 2023-11-06 DIAGNOSIS — Z833 Family history of diabetes mellitus: Secondary | ICD-10-CM | POA: Diagnosis not present

## 2023-11-06 DIAGNOSIS — D84821 Immunodeficiency due to drugs: Secondary | ICD-10-CM | POA: Diagnosis not present

## 2023-11-06 DIAGNOSIS — I130 Hypertensive heart and chronic kidney disease with heart failure and stage 1 through stage 4 chronic kidney disease, or unspecified chronic kidney disease: Secondary | ICD-10-CM | POA: Diagnosis not present

## 2023-11-06 DIAGNOSIS — F419 Anxiety disorder, unspecified: Secondary | ICD-10-CM | POA: Diagnosis not present

## 2023-11-06 DIAGNOSIS — E1165 Type 2 diabetes mellitus with hyperglycemia: Secondary | ICD-10-CM | POA: Diagnosis not present

## 2023-11-06 DIAGNOSIS — E1122 Type 2 diabetes mellitus with diabetic chronic kidney disease: Secondary | ICD-10-CM | POA: Diagnosis not present

## 2023-11-10 DIAGNOSIS — R0609 Other forms of dyspnea: Secondary | ICD-10-CM | POA: Diagnosis not present

## 2023-11-10 DIAGNOSIS — J9611 Chronic respiratory failure with hypoxia: Secondary | ICD-10-CM | POA: Diagnosis not present

## 2023-11-10 DIAGNOSIS — J449 Chronic obstructive pulmonary disease, unspecified: Secondary | ICD-10-CM | POA: Diagnosis not present

## 2023-11-11 DIAGNOSIS — R0609 Other forms of dyspnea: Secondary | ICD-10-CM | POA: Diagnosis not present

## 2023-11-11 DIAGNOSIS — J449 Chronic obstructive pulmonary disease, unspecified: Secondary | ICD-10-CM | POA: Diagnosis not present

## 2023-11-11 DIAGNOSIS — R0602 Shortness of breath: Secondary | ICD-10-CM | POA: Diagnosis not present

## 2023-11-12 ENCOUNTER — Telehealth: Payer: Self-pay | Admitting: Family Medicine

## 2023-11-12 NOTE — Telephone Encounter (Signed)
 Patient states she would like to get the knee injection in her left knee that "she got the last time". I have her scheduled for Monday but I wanted to confirm what it is that she is getting so we can make sure we have what she needs authorized and/ or ready for her.

## 2023-11-12 NOTE — Telephone Encounter (Signed)
 Pt received Kenalog injection on 07/26/22 for left knee OA.

## 2023-11-13 NOTE — Telephone Encounter (Signed)
 Anticipate doing conventional steroid injection on Monday.

## 2023-11-17 ENCOUNTER — Other Ambulatory Visit: Payer: Self-pay

## 2023-11-17 ENCOUNTER — Ambulatory Visit: Admitting: Family Medicine

## 2023-11-17 VITALS — BP 122/74 | HR 48 | Ht 61.0 in | Wt 254.0 lb

## 2023-11-17 DIAGNOSIS — G8929 Other chronic pain: Secondary | ICD-10-CM | POA: Diagnosis not present

## 2023-11-17 DIAGNOSIS — M25562 Pain in left knee: Secondary | ICD-10-CM

## 2023-11-17 NOTE — Patient Instructions (Addendum)
 Thank you for coming in today.   You received an injection today. Seek immediate medical attention if the joint becomes red, extremely painful, or is oozing fluid.   Check back as needed

## 2023-11-17 NOTE — Progress Notes (Signed)
   Rubin Payor, PhD, LAT, ATC acting as a scribe for Clementeen Graham, MD.  Kristi Hunter is a 83 y.o. female who presents to [facility] today for cont'd L knee pain. Pt's last visit w/ Dr. [name] for her knee was on 01/17/23 and a GAE was ordered. Last L knee steroid injection, 07/26/22.  Today, pt reports L knee pain returned intermittently around the beginning of March. Pain worsened about 2-wks ago and "locked-up" on her.   Dx imaging: L knee MRI             07/26/22 L knee XR  Pertinent review of systems: No fevers or chills  Relevant historical information: Sleep apnea and history of breast cancer. Obesity.  Exam:  BP 122/74   Pulse (!) 48   Ht 5\' 1"  (1.549 m)   Wt 254 lb (115.2 kg)   SpO2 93%   BMI 47.99 kg/m  General: Well Developed, well nourished, and in no acute distress.   MSK: Left knee mild effusion decreased range of motion.    Lab and Radiology Results  Procedure: Real-time Ultrasound Guided Injection of left knee joint superior lateral patella space Device: Philips Affiniti 50G/GE Logiq Images permanently stored and available for review in PACS Verbal informed consent obtained.  Discussed risks and benefits of procedure. Warned about infection, bleeding, hyperglycemia damage to structures among others. Patient expresses understanding and agreement Time-out conducted.   Noted no overlying erythema, induration, or other signs of local infection.   Skin prepped in a sterile fashion.   Local anesthesia: Topical Ethyl chloride.   With sterile technique and under real time ultrasound guidance: 40 mg of Kenalog and 2 mL of Marcaine injected into knee joint. Fluid seen entering the joint capsule.   Completed without difficulty   Pain immediately resolved suggesting accurate placement of the medication.   Advised to call if fevers/chills, erythema, induration, drainage, or persistent bleeding.   Images permanently stored and  available for review in the ultrasound unit.  Impression: Technically successful ultrasound guided injection.       Assessment and Plan: 83 y.o. female with chronic left knee pain.  Pain due to DJD.  Patient had genicular artery embolization previously which has helped quite a bit.  She is having an exacerbation of flareup of pain now.  Plan for conventional steroid injection.   PDMP not reviewed this encounter. Orders Placed This Encounter  Procedures   [name] LIMITED JOINT SPACE STRUCTURES LOW LEFT(NO LINKED CHARGES)    Reason for Exam (SYMPTOM  OR DIAGNOSIS REQUIRED):   left knee pain    Preferred imaging location?:   [facility]   No orders of the defined types were placed in this encounter.    Discussed warning signs or symptoms. Please see discharge instructions. Patient expresses understanding.   The above documentation has been reviewed and is accurate and complete Clementeen Graham, M.D.

## 2023-11-18 DIAGNOSIS — C55 Malignant neoplasm of uterus, part unspecified: Secondary | ICD-10-CM | POA: Diagnosis not present

## 2023-11-18 DIAGNOSIS — J9611 Chronic respiratory failure with hypoxia: Secondary | ICD-10-CM | POA: Diagnosis not present

## 2023-11-18 DIAGNOSIS — D3501 Benign neoplasm of right adrenal gland: Secondary | ICD-10-CM | POA: Diagnosis not present

## 2023-12-01 ENCOUNTER — Telehealth (INDEPENDENT_AMBULATORY_CARE_PROVIDER_SITE_OTHER): Admitting: Psychology

## 2023-12-01 DIAGNOSIS — F5089 Other specified eating disorder: Secondary | ICD-10-CM | POA: Diagnosis not present

## 2023-12-01 NOTE — Progress Notes (Signed)
  Office: [phone]  /  Fax: [phone]    Date: December 01, 2023  Appointment Start Time: 8:34am Duration: 23 minutes Provider: Catherene Close, Psy.D. Type of Session: Individual Therapy  Location of Patient: Home (private location) Location of Provider: Provider's Home (private office) Type of Contact: Telepsychological Visit via MyChart Video Visit  Session Content: This provider called Bynum Cassis at 8:32am as she did not present for today's appointment. She indicated a plan to join for today's appointment. As such, today's appointment was initiated 4 minutes late. Jahanna is a 83 y.o. female presenting for a follow-up appointment to address the previously established treatment goal of increasing coping skills.Today's appointment was a telepsychological visit. Beatrix provided verbal consent for today's telepsychological appointment and she is aware she is responsible for securing confidentiality on her end of the session. Prior to proceeding with today's appointment, Beckham's physical location at the time of this appointment was obtained as well a phone number she could be reached at in the event of technical difficulties. Daniell and this provider participated in today's telepsychological service.   This provider conducted a brief check-in. Chelsye shared about her upcoming vacation. Regarding eating habits, she noted, "I thought it was going well until Easter came around." Further explored and processed. Of note, she stated she is back on track as of this morning and has reduced alcohol and soda intake. Reviewed triggers for emotional eating behaviors. She acknowledged squelch urgency and out of habit are frequent triggers. Explored alternative options when triggers occur (e.g., add steps/obstacles to limit accessibility to trigger foods, donate candy). Overall, Marshawn was receptive to today's appointment as evidenced by openness to sharing, responsiveness to feedback, and  willingness to implement discussed strategies .  Mental Status Examination:  Appearance: neat Behavior: appropriate to circumstances Mood: neutral Affect: mood congruent Speech: WNL Eye Contact: appropriate Psychomotor Activity: WNL Gait: unable to assess Thought Process: linear, logical, and goal directed and no evidence or endorsement of suicidal, homicidal, and self-harm ideation, plan and intent  Thought Content/Perception: no hallucinations, delusions, bizarre thinking or behavior endorsed or observed Orientation: AAOx4 Memory/Concentration: intact Insight: good Judgment: good  Interventions:  Conducted a brief chart review Provided empathic reflections and validation Reviewed content from the previous session Provided positive reinforcement Employed supportive psychotherapy interventions to facilitate reduced distress and to improve coping skills with identified stressors Engaged patient in problem solving  DSM-5 Diagnosis(es): F50.89 Other Specified Feeding or Eating Disorder, Emotional Eating Behaviors  Treatment Goal & Progress: Bailie re-established care with this provider and following treatment goal was established: increase coping skills. Ambermarie has demonstrated progress in her goal as evidenced by increased awareness of hunger patterns. Hiedi also continues to demonstrate willingness to engage in learned skill(s) (e.g., implementing vacation strategies; utilizing non-dominant hand to eat to slow down eating).   Plan: The next appointment is scheduled for 12/16/2023 at 8:30am, which will be via MyChart Video Visit. The next session will focus on working towards the established treatment goal.   Catherene Close, PsyD

## 2023-12-02 ENCOUNTER — Other Ambulatory Visit: Payer: Self-pay | Admitting: Interventional Radiology

## 2023-12-02 DIAGNOSIS — M1712 Unilateral primary osteoarthritis, left knee: Secondary | ICD-10-CM

## 2023-12-03 ENCOUNTER — Ambulatory Visit (INDEPENDENT_AMBULATORY_CARE_PROVIDER_SITE_OTHER): Payer: Medicare HMO | Admitting: Family Medicine

## 2023-12-03 ENCOUNTER — Encounter (INDEPENDENT_AMBULATORY_CARE_PROVIDER_SITE_OTHER): Payer: Self-pay | Admitting: Family Medicine

## 2023-12-03 VITALS — BP 133/75 | HR 83 | Temp 97.8°F | Ht 63.0 in | Wt 247.0 lb

## 2023-12-03 DIAGNOSIS — R7303 Prediabetes: Secondary | ICD-10-CM

## 2023-12-03 DIAGNOSIS — F5089 Other specified eating disorder: Secondary | ICD-10-CM | POA: Diagnosis not present

## 2023-12-03 DIAGNOSIS — F3289 Other specified depressive episodes: Secondary | ICD-10-CM

## 2023-12-03 DIAGNOSIS — R600 Localized edema: Secondary | ICD-10-CM | POA: Diagnosis not present

## 2023-12-03 DIAGNOSIS — Z6841 Body Mass Index (BMI) 40.0 and over, adult: Secondary | ICD-10-CM | POA: Diagnosis not present

## 2023-12-03 DIAGNOSIS — E669 Obesity, unspecified: Secondary | ICD-10-CM

## 2023-12-03 MED ORDER — METFORMIN HCL 500 MG PO TABS: 500.0000 mg | ORAL_TABLET | Freq: Three times a day (TID) | ORAL | 0 refills | Status: DC

## 2023-12-03 NOTE — Progress Notes (Signed)
 Office: [phone]  /  Fax: [phone]  WEIGHT SUMMARY AND BIOMETRICS  Anthropometric Measurements Height: 5\' 1"  (1.549 m) Weight: 247 lb (112 kg) BMI (Calculated): 46.69 Weight at Last Visit: 252 lb Weight Lost Since Last Visit: 5 lb Weight Gained Since Last Visit: 0 Starting Weight: 267 lb Total Weight Loss (lbs): 21 lb (9.526 kg) Peak Weight: 280 lb   Body Composition  Body Fat %: 57.3 % Fat Mass (lbs): 142 lbs Muscle Mass (lbs): 100.4 lbs Visceral Fat Rating : 23   Other Clinical Data Fasting: no Labs: no Today's Visit #: 42 Starting Date: 02/22/20    Chief Complaint: OBESITY   History of Present Illness Kristi [place] "Kristi Hunter" is an 83 year old female who presents for a follow-up on her obesity treatment and progress.  She is unable to follow a structured eating plan due to her living situation and the meal plan in place with [place]. She is attempting portion control and smart choices, which she feels she is doing correctly about two-thirds of the time. She has lost five pounds in the last two months.  She is not currently exercising due to bad knees. She has a history of emotional eating behaviors and is seeing a psychologist for these issues. She has been working on strategies such as 'left hand eating' and identifying triggers for eating out of habit rather than hunger.  She has a history of prediabetes and is being treated with metformin , 500 mg three times a day, although she often only takes it twice a day. She requests a refill of this medication.  She experiences bilateral lower extremity edema and takes furosemide  three times a week, primarily for swelling. If she sees her leg swelling, she takes it right away. She has a family history of heart disease, with both parents having died from it.  She reports getting out of breath when walking distances and has been given oxygen  by a pulmonologist, but her oxygen  levels have not dropped below 90. She  uses a walker for support, especially on uneven surfaces, and has been prescribed Trelegy for respiratory issues, although she is concerned about the cost. No problems with metformin . Reports stiffness in both knees, with one knee replaced.      PHYSICAL EXAM:  Blood pressure 133/75, pulse 83, temperature 97.8 F (36.6 C), height 5\' 1"  (1.549 m), weight 247 lb (112 kg), SpO2 96%. Body mass index is 46.67 kg/m.  DIAGNOSTIC DATA REVIEWED:  BMET    Component Value Date/Time   NA 144 06/17/2023 1105   K 3.5 06/17/2023 1105   CL 100 06/17/2023 1105   CO2 31 (H) 06/17/2023 1105   GLUCOSE 103 (H) 06/17/2023 1105   GLUCOSE 102 (H) 03/18/2023 0845   BUN 18 06/17/2023 1105   CREATININE 0.60 06/17/2023 1105   CREATININE 0.76 12/11/2021 1226   CALCIUM  9.2 06/17/2023 1105   GFRNONAA >60 03/18/2023 0845   GFRNONAA >60 12/11/2021 1226   GFRAA 104 02/22/2020 1339   Lab Results  Component Value Date   HGBA1C 6.1 (H) 06/17/2023   HGBA1C 6.0 01/13/2019   Lab Results  Component Value Date   INSULIN  22.5 02/20/2022   INSULIN  21.5 02/22/2020   Lab Results  Component Value Date   TSH 3.10 08/23/2022   CBC    Component Value Date/Time   WBC 7.5 03/18/2023 0845   RBC 4.29 03/18/2023 0845   HGB 12.1 03/18/2023 0845   HGB 12.6 02/20/2022 0942   HCT 38.1 03/18/2023 0845  HCT 39.6 02/20/2022 0942   PLT 279 03/18/2023 0845   PLT 318 02/20/2022 0942   MCV 88.8 03/18/2023 0845   MCV 87 02/20/2022 0942   MCH 28.2 03/18/2023 0845   MCHC 31.8 03/18/2023 0845   RDW 13.4 03/18/2023 0845   RDW 13.0 02/20/2022 0942   Iron Studies No results found for: "IRON", "TIBC", "FERRITIN", "IRONPCTSAT" Lipid Panel     Component Value Date/Time   CHOL 161 08/23/2022 0000   CHOL 159 02/20/2022 0942   TRIG 58 08/23/2022 0000   HDL 64 08/23/2022 0000   HDL 49 02/20/2022 0942   CHOLHDL 3 01/13/2019 0913   VLDL 14.4 01/13/2019 0913   LDLCALC 84 08/23/2022 0000   LDLCALC 98 02/20/2022 0942    Hepatic Function Panel     Component Value Date/Time   PROT 6.4 06/17/2023 1105   ALBUMIN 3.6 (L) 06/17/2023 1105   AST 18 06/17/2023 1105   AST 19 12/11/2021 1226   ALT 16 06/17/2023 1105   ALT 17 12/11/2021 1226   ALKPHOS 59 06/17/2023 1105   BILITOT <0.2 06/17/2023 1105   BILITOT 0.3 12/11/2021 1226   BILIDIR 0.1 03/15/2022 1149      Component Value Date/Time   TSH 3.10 08/23/2022 0000   TSH 1.470 02/22/2020 1339   Nutritional Lab Results  Component Value Date   VD25OH 30.9 06/17/2023   VD25OH 32.7 02/20/2022   VD25OH 35.1 12/25/2021     Assessment and Plan Assessment & Plan Obesity Obesity management is ongoing with a five-pound weight loss over the last two months. She is unable to follow a structured eating plan due to her living situation but practices portion control and makes healthy food choices about two-thirds of the time. She is not exercising due to knee issues. Emotional eating behaviors are being addressed with psychological support. She is considering a second opinion for knee surgery as her current doctor requires a weight loss of forty pounds before proceeding. - Continue portion control and healthy food choices   Emotional eating behaviors Emotional eating behaviors are being addressed with Dr. Delaine Favorite, a psychologist. She is working on identifying triggers and has found strategies such as eating with her left hand helpful. - Continue therapy with Dr. Delaine Favorite  Prediabetes Prediabetes is managed with metformin  500 mg three times daily. She reports difficulty remembering the third dose but consistently takes two doses daily. No side effects from metformin  are reported. She is advised to aim for three doses daily for optimal management. - Refill metformin  for 90 days - Encourage adherence to three doses of metformin  daily  Bilateral lower extremity edema Bilateral lower extremity edema is managed with furosemide  as needed, approximately three times a  week, primarily when leg swelling is noticed. She is concerned about excessive bathroom use if taken daily. Per Kristi Hunter, Edema is not associated with heart failure as cardiac evaluation was normal. - Continue furosemide  as needed for leg swelling -Continue weight loss efforts to improve swelling    She was informed of the importance of frequent follow up visits to maximize her success with intensive lifestyle modifications for her multiple health conditions.    Jasmine Mesi, MD

## 2023-12-11 DIAGNOSIS — J449 Chronic obstructive pulmonary disease, unspecified: Secondary | ICD-10-CM | POA: Diagnosis not present

## 2023-12-16 ENCOUNTER — Telehealth (INDEPENDENT_AMBULATORY_CARE_PROVIDER_SITE_OTHER): Payer: Self-pay | Admitting: Psychology

## 2023-12-16 ENCOUNTER — Telehealth (INDEPENDENT_AMBULATORY_CARE_PROVIDER_SITE_OTHER): Admitting: Psychology

## 2023-12-16 DIAGNOSIS — F5089 Other specified eating disorder: Secondary | ICD-10-CM

## 2023-12-16 NOTE — Telephone Encounter (Signed)
  Office: [phone]  /  Fax: [phone]  Date of Call: Dec 16, 2023  Time of Call: 8:34am Provider: Catherene Close, PsyD  CONTENT: This provider called Bynum Cassis to check-in as she did not present for today's MyChart Video Visit appointment. A HIPAA compliant voicemail was left requesting a call back. Of note, this provider stayed on the MyChart Video Visit appointment for 5 minutes prior to signing off per the clinic's grace period policy.    PLAN: This provider will wait for Lacia to call back. No further follow-up planned by this provider.

## 2023-12-16 NOTE — Progress Notes (Signed)
  Office: [phone]  /  Fax: [phone]    Date: Dec 16, 2023  Appointment Start Time: 2:31pm Duration: 33 minutes Hunter: Catherene Close, Psy.D. Type of Session: Individual Therapy  Location of Patient: Home (private location) Location of Hunter: Provider's Home (private office) Type of Contact: Telepsychological Visit via MyChart Video Visit  Session Content: Kristi Hunter is a 83 y.o. female presenting for a follow-up appointment to address the previously established treatment goal of increasing coping skills.Today's appointment was a telepsychological visit. Kristi Hunter provided verbal consent for today's telepsychological appointment and she is aware she is responsible for securing confidentiality on her end of the session. Prior to proceeding with today's appointment, Kristi Hunter's physical location at the time of this appointment was obtained as well a phone number she could be reached at in the event of technical difficulties. Kristi Hunter participated in today's telepsychological service.   This Hunter conducted a brief check-in. Kristi Hunter stated she plans to purchase a subscription for an app that she can take pictures of her food and it will reportedly calculate her calorie and protein intake. Further explored and processed. She was encouraged to discuss further with Dr. Alvia Awkward. She agreed. Reviewed goals set during the last appointment. While Kristi Hunter stated she continues to eat ice cream, she often will eat only half of what is served. She also stated she is becoming more comfortable with disposing food if needed versus feeling as though she has to clean her plate. Kristi Hunter also reported she donated the Easter candy she had to avoid temptations, as candy is a trigger food. [name], [name], Kristi Hunter was receptive to today's appointment as evidenced by openness to sharing,  responsiveness to feedback, and willingness to continue engaging in learned skills.   Mental Status Examination:  Appearance: neat Behavior: appropriate to circumstances Mood: neutral Affect: mood congruent Speech: WNL Eye Contact: appropriate Psychomotor Activity: WNL Gait: unable to assess Thought Process: linear, logical, and goal directed and no evidence or endorsement of suicidal, homicidal, and self-harm ideation, plan and intent  Thought Content/Perception: no hallucinations, delusions, bizarre thinking or behavior endorsed or observed Orientation: AAOx4 Memory/Concentration: intact Insight: good Judgment: good  Interventions:  Conducted a brief chart review Provided empathic reflections and validation Reviewed content from the previous session Provided positive reinforcement Employed supportive psychotherapy interventions to facilitate reduced distress and to improve coping skills with identified stressors  DSM-5 Diagnosis(es): F50.89 Other Specified Feeding or Eating Disorder, Emotional Eating Behaviors  Treatment Goal & Progress: Kristi Hunter re-established care with this Hunter and following treatment goal was established: increase coping skills. Kristi Hunter has demonstrated progress in her goal as evidenced by increased awareness of hunger patterns. Kristi Hunter also continues to demonstrate willingness to engage in learned skill(s) (e.g., implementing vacation strategies; utilizing non-dominant hand to eat to slow down eating).    Plan: The next appointment is scheduled for 01/26/2024 at 8:30am, which will be via MyChart Video Visit. The next session will focus on working towards the established treatment goal.   Catherene Close, PsyD

## 2023-12-26 NOTE — Progress Notes (Signed)
 This encounter was conducted via the [employer] providing interactive audio and visual communication. The patient provided verbal consent to conduct a virtual appointment. The patient was located at their primary residence during this encounter.  Referring Physician(s): Garlan Juniper, MD  Chief Complaint: The patient is seen in virtual video follow up today s/p left geniculate artery embolization 03/18/23  History of present illness: Initial HPI from last clinic visit 04/18/23 Kristi Hunter is an 83 y.o. female with a medical history significant for HTN, CKD, anxiety, uterine cancer, breast cancer s/p lumpectomy and adjuvant radiotherapy (2022) and right total knee replacement.  She also has a history of left knee pain and has been followed by Dr. Alease Hunter since 2021. She has received physical therapy and numerous steroid injections as well as the Orthovisc series in attempts to alleviate her knee pain. Steroid injections have provided temporary relief and her last injection was December 2023. Earlier this year she developed worsening posterior knee pain and swelling. MR knee 01/02/23 showed a dominant finding of advanced tricompartmental osteoarthritis which is the likely culprit of her knee pain.   She is not an ideal candidate for a knee replacement due to her BMI of 46.  She has been referred to our services for evaluation of geniculate artery embolization.   She complains primarily of left knee pain and additional muscular pain in the left calf.  She thought she had a Bakers cyst, but this was ruled out on MRI.  She takes naproxen  and Tylenol  occasionally, with minimal relief.  She states her primary functional limitation is getting up and down out of chairs, bed, and off the toilet.  She does water aerobics and upper body weights at her independent living facility several times per week.  She has been unsuccessful and losing weight.   We discussed the risks, benefits and procedural  expectations of geniculate artery and she was agreeable to proceed. She underwent technically successful left superior medial geniculate artery embolization at [facility] on 03/18/23 and she tolerated the procedure well with discharge home the same day. She followed up with me 04/18/23 and she reported a satisfactory decrease in her knee pain. Her WOMAC pain score has improved from 57/96 to 30/96 and her VAS pain score has improved from 6/10 to 1/10.  She was primarily thrilled that her knee no longer hurt when getting in and out of her car, which was her chief complaint prior to the procedure. She was rarely taking pain medication and was overall very pleased with the outcome. We made plans to follow up in 6 months.   She was evaluated by Dr. Alease Hunter 11/17/23 for flare up of her left knee pain and the knee was injected with steroids. She presents today via virtual video visit for follow up.   WOMAC on 12/02/23 was 61/96.  She complains of mostly stiffness more than pain.  She reports an average pain of 4/10 over the past 2 weeks. She is still working diligently to limit her caloric intake in hopes of losing weight for arthroplasty, however is still having trouble losing weight.  She is not routinely taking pain medications.  Past Medical History:  Diagnosis Date   Anemia    Anxiety    Arthritis    several different parts of the body per pt 10/17/2022   Back pain    Breast cancer (HCC)    02/20/21   Cancer (HCC)    uterine   Chest pain    Chicken  pox    Chronic kidney disease    Constipation    Emphysema of lung (HCC)    Family history of breast cancer 02/14/2021   Glaucoma    Hypertension    Joint pain    Lower extremity edema    Lymphedema    Osteoarthritis    Personal history of malignant neoplasm of uterus 02/14/2021   Pre-diabetes    Prediabetes    Shortness of breath    Sleep apnea    doesn't use CPAP machine anymore   Urine incontinence    Uterine cancer (HCC)    2011   UTI  (urinary tract infection)    Vertigo    had vertigo last week. 10/17/2022   Vitamin B 12 deficiency    during pregnancy    Past Surgical History:  Procedure Laterality Date   ABDOMINAL HYSTERECTOMY  2011   BOTOX  INJECTION N/A 10/22/2022   Procedure: BOTOX  INJECTION 100 UNITS;  Surgeon: Roxane Copp, MD;  Location[facility];  Service: Urology;  Laterality: N/A;   BREAST LUMPECTOMY WITH RADIOACTIVE SEED LOCALIZATION Right 02/20/2021   Procedure: RIGHT BREAST LUMPECTOMY WITH RADIOACTIVE SEED LOCALIZATION;  Surgeon: Lockie Rima, MD;  Location: [facility] OR;  Service: General;  Laterality: Right;   CYSTOSCOPY WITH INJECTION N/A 10/22/2022   Procedure: CYSTOSCOPY WITH INJECTION OF [place];  Surgeon: Roxane Copp, MD;  Location: [facility];  Service: Urology;  Laterality: N/A;   IR ANGIOGRAM EXTREMITY RIGHT  03/18/2023   IR ANGIOGRAM SELECTIVE EACH ADDITIONAL VESSEL  03/18/2023   IR ANGIOGRAM SELECTIVE EACH ADDITIONAL VESSEL  03/18/2023   IR EMBO ARTERIAL NOT HEMORR HEMANG INC GUIDE ROADMAPPING  03/18/2023   IR RADIOLOGIST EVAL & MGMT  02/05/2023   IR RADIOLOGIST EVAL & MGMT  04/18/2023   IR US  GUIDE VASC ACCESS RIGHT  03/18/2023   REPLACEMENT TOTAL KNEE Right 2012    Allergies: Amoxicillin and Percocet [oxycodone -acetaminophen ]  Medications: Prior to Admission medications   Medication Sig Start Date End Date Taking? Authorizing Provider  acetaminophen  (TYLENOL ) 650 MG CR tablet Take 650 mg by mouth every 8 (eight) hours as needed for pain. Takes usually once a day    [provider]  alendronate  (FOSAMAX ) 70 MG tablet TAKE 1 TABLET (70 MG TOTAL) BY MOUTH EVERY 7 (SEVEN) DAYS. TAKE WITH A FULL GLASS OF WATER ON AN EMPTY STOMACH. 04/04/22   Tonna Frederic, MD  AREXVY 120 MCG/0.5ML injection  07/15/23   [provider]  budeson-glycopyrrolate-formoterol (BREZTRI AEROSPHERE) 160-9-4.8 MCG/ACT AERO inhaler Inhale 2 puffs into the lungs. 11/13/23    [provider]  calcium  carbonate (OS-CAL) 600 MG TABS tablet Take 1 tablet (600 mg total) by mouth 2 (two) times daily with a meal. Patient taking differently: Take 600 mg by mouth as needed. 05/10/21   Tonna Frederic, MD  chlorthalidone  (HYGROTON ) 25 MG tablet Take 1 tablet (25 mg total) by mouth daily. 03/15/22   Tonna Frederic, MD  ciprofloxacin (CIPRO) 250 MG tablet Take 250 mg by mouth 2 (two) times daily. 10/17/23   [provider]  diclofenac Sodium (VOLTAREN) 1 % GEL Apply 2 g topically daily as needed (Osteoarthritis).    [provider]  fluticasone  (FLONASE ) 50 MCG/ACT nasal spray Place 2 sprays into both nostrils in the morning and at bedtime. 04/17/23   Jasmine Mesi D, MD  furosemide  (LASIX ) 20 MG tablet Take 1 tablet (20 mg total) by mouth daily. 04/10/23   Alois Arnt  E, MD  Melatonin 10 MG TABS Take 10 mg by mouth at bedtime as needed (sleep).    [provider]  metFORMIN  (GLUCOPHAGE ) 500 MG tablet Take 1 tablet (500 mg total) by mouth 3 (three) times daily with meals. 12/03/23   Jasmine Mesi D, MD  metroNIDAZOLE  (METROGEL ) 1 % gel Apply topically daily. Patient taking differently: Apply 1 application  topically daily as needed (rosacea). 01/29/21   Tonna Frederic, MD  Multiple Vitamin (MULTIVITAMIN) tablet Take 1 tablet by mouth daily.    [provider]  tiZANidine  (ZANAFLEX ) 2 MG tablet TAKE 1-2 TABLETS (2-4 MG TOTAL) BY MOUTH EVERY 8 (EIGHT) HOURS AS NEEDED. 06/26/23   Syliva Even, MD  traZODone  (DESYREL ) 50 MG tablet Take 50 mg by mouth. 11/03/23   [provider]  Gaylan Kaufman 100-62.5-25 MCG/ACT AEPB  11/10/23   [provider]     Family History  Problem Relation Age of Onset   Heart attack Mother    Heart disease Mother    Hyperlipidemia Mother    Hypertension Mother    Stroke Mother    Miscarriages / [place] Mother    Diabetes Mother    Thyroid  disease Mother    Cancer  Mother    Obesity Mother    Melanoma Mother        dx after 3, sun-exposed areas   Cancer Father        skin   Alcohol abuse Father    Asthma Father    COPD Father    Heart attack Father    Heart disease Father    Obesity Father    Melanoma Father        dx after 4, back   Alcohol abuse Brother    Arthritis Brother    Drug abuse Brother    Alcohol abuse Brother    Cancer Brother        ? esophageal, d. 54   Breast cancer Paternal Aunt 3   Arthritis Maternal Grandmother    Diabetes Maternal Grandmother    Hyperlipidemia Maternal Grandmother    Colon cancer Maternal Grandfather        dx 25, dx 45   Alcohol abuse Paternal Grandfather    Arthritis Paternal Grandfather    Cirrhosis Paternal Grandfather    Depression Daughter    Hypertension Daughter    Mental illness Daughter    Colon polyps Daughter    Depression Son     Social History   Socioeconomic History   Marital status: Widowed    Spouse name: Not on file   Number of children: 3   Years of education: Not on file   Highest education level: Not on file  Occupational History   Occupation: Reitired IT trainer    Comment: Retired IT trainer  Tobacco Use   Smoking status: Former    Current packs/day: 0.00    Types: Cigarettes    Quit date: 08/12/1982    Years since quitting: 41.4    Passive exposure: Never   Smokeless tobacco: Never  Vaping Use   Vaping status: Never Used  Substance and Sexual Activity   Alcohol use: Yes    Alcohol/week: 1.0 standard drink of alcohol    Types: 1 Glasses of wine per week    Comment: every 1-2 wks.    Drug use: Never   Sexual activity: Not on file  Other Topics Concern   Not on file  Social History Narrative   Not on file  Social Drivers of Corporate investment banker Strain: Low Risk  (10/04/2021)   Overall Financial Resource Strain (CARDIA)    Difficulty of Paying Living Expenses: Not hard at all  Food Insecurity: Low Risk  (11/10/2023)   Received from [facility]    Hunger Vital Sign    Worried About Running Out of Food in the Last Year: Never true    Ran Out of Food in the Last Year: Never true  Transportation Needs: No Transportation Needs (11/10/2023)   Received from [facility]    In the past 12 months, has lack of reliable transportation kept you from medical appointments, meetings, work or from getting things needed for daily living? : No  Physical Activity: Inactive (08/17/2021)   Exercise Vital Sign    Days of Exercise per Week: 0 days    Minutes of Exercise per Session: 0 min  Stress: No Stress Concern Present (08/17/2021)   [employer] of Occupational Health - Occupational Stress Questionnaire    Feeling of Stress : Not at all  Social Connections: Unknown (12/17/2021)   Received from [facility]   Social Network    Social Network: Not on file     Vital Signs: There were no vitals taken for this visit.  Physical Exam  Patient is alert, oriented and able to participate fully in the conversation. No apparent discomfort or distress observed. She appears appropriately dressed.    Imaging: Left knee 07/26/22  Smith Dunk grade 3 (moderate) osteoarthritis   MRI knee 01/02/23 IMPRESSION: 1. Dominant finding is advanced tricompartmental osteoarthritis. 2. Complex tear posterior horn medial meniscus with an ossific fragment seen just peripheral to the root of the posterior horn. 3. Complex degenerative tearing posterior and anterior horns of the lateral meniscus. 4. Mucoid degeneration of the ACL.    GAE (03/18/23)  Pre-embolization left superior medial geniculate artery    Post embolization (0.3 mL 100-300 micron embospheres)  Labs:  CBC: Recent Labs    03/18/23 0845  WBC 7.5  HGB 12.1  HCT 38.1  PLT 279    COAGS: Recent Labs    03/18/23 0845  INR 1.0    BMP: Recent Labs    03/18/23 0845 06/17/23 1105  NA 138 144  K 3.7 3.5  CL 101 100  CO2 29 31*  GLUCOSE 102*  103*  BUN 24* 18  CALCIUM  8.6* 9.2  CREATININE 0.58 0.60  GFRNONAA >60  --     LIVER FUNCTION TESTS: Recent Labs    06/17/23 1105  BILITOT <0.2  AST 18  ALT 16  ALKPHOS 59  PROT 6.4  ALBUMIN 3.6*    Assessment and Plan:  83 year old female with history of left knee pain secondary to osteoarthritis (K&G grade 3) with lifestyle limiting pain and discomfort refractory to conservative measures including sustained release steroid injections and hyaluronic acid injections now status post left superior medial geniculate artery embolization on 03/18/23.  After excellent response for months after the procedure, she has had some recurrent pain, though reportedly still significantly better than prior to GAE.    Follow up in IR clinic in 6 months.  Creasie Doctor, MD Pager: [phone]    I spent a total of 25 Minutes in virtual video clinical consultation, greater than 50% of which was counseling/coordinating care for left knee osteoarthritis.

## 2023-12-29 ENCOUNTER — Ambulatory Visit
Admission: RE | Admit: 2023-12-29 | Discharge: 2023-12-29 | Disposition: A | Discharge status: Home / Self Care | Source: Ambulatory Visit | Attending: Interventional Radiology | Admitting: Interventional Radiology

## 2023-12-29 DIAGNOSIS — M25562 Pain in left knee: Secondary | ICD-10-CM | POA: Diagnosis not present

## 2023-12-29 DIAGNOSIS — M1712 Unilateral primary osteoarthritis, left knee: Secondary | ICD-10-CM | POA: Diagnosis not present

## 2023-12-29 HISTORY — PX: IR RADIOLOGIST EVAL & MGMT: IMG5224

## 2024-01-26 ENCOUNTER — Telehealth (INDEPENDENT_AMBULATORY_CARE_PROVIDER_SITE_OTHER): Admitting: Psychology

## 2024-01-26 DIAGNOSIS — F5089 Other specified eating disorder: Secondary | ICD-10-CM | POA: Diagnosis not present

## 2024-01-26 NOTE — Progress Notes (Signed)
  Office: [phone]  /  Fax: [phone]    Date: January 26, 2024  Appointment Start Time: 8:38am Duration: 27 minutes Provider: Catherene Close, Psy.D. Type of Session: Individual Therapy  Location of Patient: [name] (safe/private location) Location of Provider: Provider's Home (private office) Type of Contact: Telepsychological Visit via MyChart Video Visit  Session Content: This provider called Bynum Cassis at 8:34am as she did not present for today's appointment. She stated she forgot to check her calendar before leaving home for breakfast, noting she would be able to join. As such, today's appointment was initiated 8 minutes late. Willean is a 83 y.o. female presenting for a follow-up appointment to address the previously established treatment goal of increasing coping skills.Today's appointment was a telepsychological visit. Judythe provided verbal consent for today's telepsychological appointment and she is aware she is responsible for securing confidentiality on her end of the session. Prior to proceeding with today's appointment, Talea's physical location at the time of this appointment was obtained as well a phone number she could be reached at in the event of technical difficulties. Mikelle and this provider participated in today's telepsychological service.   This provider conducted a brief check-in. Lyndy shared, I cannot lose weight. Further explored. She acknowledged going over calories, but appeared unsure regarding protein intake as well as other macros (e.g., carb intake). Thus, she was encouraged to access her food journal app during today's appointment to discuss further. Psychoeducation provided regarding the importance of protein intake and its impact on fullness/hunger. Also discussed other factors that can impact weight loss efforts (e.g., exercise, sleep, water intake). Feather identified waking up every two hours to go to the bathroom, but  usually can fall asleep within 15 minutes. She believes she averages approximately 6-7 hours of sleep. Yaeko agreed to focus on increasing your physical activity, increasing water intake, and being mindful of sleep habits, adding willingness to discuss frequent urination, water pill use, and water intake with her PCP. Overall, Ozzie was receptive to today's appointment as evidenced by openness to sharing, responsiveness to feedback, and willingness to continue engaging in learned skills.  Mental Status Examination:  Appearance: neat Behavior: appropriate to circumstances Mood: neutral Affect: mood congruent Speech: WNL Eye Contact: appropriate Psychomotor Activity: WNL Gait: unable to assess Thought Process: linear, logical, and goal directed and no evidence or endorsement of suicidal, homicidal, and self-harm ideation, plan and intent  Thought Content/Perception: no hallucinations, delusions, bizarre thinking or behavior endorsed or observed Orientation: AAOx4 Memory/Concentration: intact Insight: fair Judgment: fair  Interventions:  Conducted a brief chart review Provided empathic reflections and validation Employed supportive psychotherapy interventions to facilitate reduced distress and to improve coping skills with identified stressors Psychoeducation provided regarding importance of protein intake and other factors impacting weight loss efforts  DSM-5 Diagnosis(es): F50.89 Other Specified Feeding or Eating Disorder, Emotional Eating Behaviors  Treatment Goal & Progress: Mandee re-established care with this provider and following treatment goal was established: increase coping skills. Lanier has demonstrated progress in her goal as evidenced by increased awareness of hunger patterns. Aimie also continues to demonstrate willingness to engage in learned skill(s) (e.g., implementing vacation strategies; utilizing non-dominant hand to eat to slow down eating).   Plan:  The next appointment is scheduled for 02/09/2024 at 9am, which will be via MyChart Video Visit. The next session will focus on working towards the established treatment goal.   Catherene Close, PsyD

## 2024-01-29 ENCOUNTER — Encounter (INDEPENDENT_AMBULATORY_CARE_PROVIDER_SITE_OTHER): Payer: Self-pay | Admitting: Family Medicine

## 2024-01-29 ENCOUNTER — Ambulatory Visit (INDEPENDENT_AMBULATORY_CARE_PROVIDER_SITE_OTHER): Admitting: Family Medicine

## 2024-01-29 VITALS — BP 117/74 | HR 88 | Temp 98.0°F | Ht 63.0 in | Wt 251.0 lb

## 2024-01-29 DIAGNOSIS — G4733 Obstructive sleep apnea (adult) (pediatric): Secondary | ICD-10-CM | POA: Diagnosis not present

## 2024-01-29 DIAGNOSIS — E66813 Obesity, class 3: Secondary | ICD-10-CM

## 2024-01-29 DIAGNOSIS — E669 Obesity, unspecified: Secondary | ICD-10-CM

## 2024-01-29 DIAGNOSIS — Z6841 Body Mass Index (BMI) 40.0 and over, adult: Secondary | ICD-10-CM | POA: Diagnosis not present

## 2024-01-29 DIAGNOSIS — R7303 Prediabetes: Secondary | ICD-10-CM | POA: Diagnosis not present

## 2024-01-29 MED ORDER — METFORMIN HCL 500 MG PO TABS: 500.0000 mg | ORAL_TABLET | Freq: Three times a day (TID) | ORAL | 0 refills | Status: DC

## 2024-01-29 MED ORDER — TIRZEPATIDE-WEIGHT MANAGEMENT 2.5 MG/0.5ML ~~LOC~~ SOAJ: 2.5000 mg | SUBCUTANEOUS | 0 refills | Status: DC

## 2024-01-29 NOTE — Progress Notes (Signed)
 Office: [phone]  /  Fax: [phone]  WEIGHT SUMMARY AND BIOMETRICS  Anthropometric Measurements Height: 5' 3 (1.6 m) Weight: 251 lb (113.9 kg) BMI (Calculated): 44.47 Weight at Last Visit: 247 lb Weight Lost Since Last Visit: 0 Weight Gained Since Last Visit: 4 lb Starting Weight: 267 lb Total Weight Loss (lbs): 16 lb (7.258 kg) Peak Weight: 280 lb   Body Composition  Body Fat %: 58.3 % Fat Mass (lbs): 146.4 lbs Muscle Mass (lbs): 99.4 lbs Visceral Fat Rating : 22   Other Clinical Data Fasting: no Labs: no Today's Visit #: 43 Starting Date: 02/22/20    Chief Complaint: OBESITY   History of Present Illness Kristi Hunter is an 83 year old female with obesity and obstructive sleep apnea who presents for obesity treatment and progress assessment.  She has been adhering to her own portion control and smart choices eating plan approximately 80% of the time. Despite these efforts, she has gained four pounds over the last two months and is not currently engaging in regular exercise. She is exploring medication options to aid in weight loss but is concerned about insurance coverage.  She has a history of obstructive sleep apnea and uses a CPAP machine inconsistently. Her last sleep study in August 2021 indicated moderate sleep apnea with an AHI of 22. She uses the CPAP machine for about three hours when she does use it, but acknowledges the need to increase usage to at least four hours. She experiences dry mouth when using the CPAP, which affects her compliance.  She is managing her diet by hydrating better, reducing carbohydrate intake, and increasing protein consumption. Her typical breakfast includes a bagel with or without peanut butter, a hard-boiled egg, and fruit. Lunch is often late in the afternoon, and she sometimes skips dinner, opting for a small salad instead. She is considering adding protein shakes to her diet to increase protein intake.  She  walks close to 5,000 steps a day, except on days when she plays bridge, which reduces her activity to about 3,000 steps.      PHYSICAL EXAM:  Blood pressure 117/74, pulse 88, temperature 98 F (36.7 C), height 5' 3 (1.6 m), weight 251 lb (113.9 kg), SpO2 95%. Body mass index is 44.46 kg/m.  DIAGNOSTIC DATA REVIEWED:  BMET    Component Value Date/Time   NA 144 06/17/2023 1105   K 3.5 06/17/2023 1105   CL 100 06/17/2023 1105   CO2 31 (H) 06/17/2023 1105   GLUCOSE 103 (H) 06/17/2023 1105   GLUCOSE 102 (H) 03/18/2023 0845   BUN 18 06/17/2023 1105   CREATININE 0.60 06/17/2023 1105   CREATININE 0.76 12/11/2021 1226   CALCIUM  9.2 06/17/2023 1105   GFRNONAA >60 03/18/2023 0845   GFRNONAA >60 12/11/2021 1226   GFRAA 104 02/22/2020 1339   Lab Results  Component Value Date   HGBA1C 6.1 (H) 06/17/2023   HGBA1C 6.0 01/13/2019   Lab Results  Component Value Date   INSULIN  22.5 02/20/2022   INSULIN  21.5 02/22/2020   Lab Results  Component Value Date   TSH 3.10 08/23/2022   CBC    Component Value Date/Time   WBC 7.5 03/18/2023 0845   RBC 4.29 03/18/2023 0845   HGB 12.1 03/18/2023 0845   HGB 12.6 02/20/2022 0942   HCT 38.1 03/18/2023 0845   HCT 39.6 02/20/2022 0942   PLT 279 03/18/2023 0845   PLT 318 02/20/2022 0942   MCV 88.8 03/18/2023 0845   MCV 87  02/20/2022 0942   MCH 28.2 03/18/2023 0845   MCHC 31.8 03/18/2023 0845   RDW 13.4 03/18/2023 0845   RDW 13.0 02/20/2022 0942   Iron Studies No results found for: IRON, TIBC, FERRITIN, IRONPCTSAT Lipid Panel     Component Value Date/Time   CHOL 161 08/23/2022 0000   CHOL 159 02/20/2022 0942   TRIG 58 08/23/2022 0000   HDL 64 08/23/2022 0000   HDL 49 02/20/2022 0942   CHOLHDL 3 01/13/2019 0913   VLDL 14.4 01/13/2019 0913   LDLCALC 84 08/23/2022 0000   LDLCALC 98 02/20/2022 0942   Hepatic Function Panel     Component Value Date/Time   PROT 6.4 06/17/2023 1105   ALBUMIN 3.6 (L) 06/17/2023 1105   AST  18 06/17/2023 1105   AST 19 12/11/2021 1226   ALT 16 06/17/2023 1105   ALT 17 12/11/2021 1226   ALKPHOS 59 06/17/2023 1105   BILITOT <0.2 06/17/2023 1105   BILITOT 0.3 12/11/2021 1226   BILIDIR 0.1 03/15/2022 1149      Component Value Date/Time   TSH 3.10 08/23/2022 0000   TSH 1.470 02/22/2020 1339   Nutritional Lab Results  Component Value Date   VD25OH 30.9 06/17/2023   VD25OH 32.7 02/20/2022   VD25OH 35.1 12/25/2021     Assessment and Plan Assessment & Plan Obesity She has gained four pounds in the last two months and is not currently exercising. She adheres to her portion control eating plan approximately 80% of the time. She is interested in pharmacological intervention for weight loss. Zepbound, a GLP-1 receptor agonist, was discussed as a treatment option, with insurance coverage contingent on the diagnosis of sleep apnea. Zepbound is anticipated to enhance satiety, aiding in weight loss. She was advised to increase protein intake to at least 75 grams per day to support weight loss efforts. Potential side effects of Zepbound include early satiety and constipation, particularly with inadequate hydration. - Initiate Zepbound for weight loss, covered under sleep apnea diagnosis. - Continue metformin  500 mg TID. - Increase protein intake to at least 75 grams per day. - Aim for 150 minutes of exercise per week.  Obstructive Sleep Apnea She has moderate obstructive sleep apnea with an AHI of 22 from an August 2021 sleep study. CPAP usage is inconsistent, with dry mouth affecting adherence. Consistent CPAP use for at least four hours per night is necessary for insurance coverage of Zepbound. She was advised to use the CPAP while reading in bed to increase usage time. - Use CPAP machine consistently for at least four hours per night. - Monitor CPAP usage to ensure compliance for insurance coverage of Zepbound. -She will continue to follow a PC/[place] eating plan and increase her  protein intake - Aim for 150 minutes of exercise per week.  Prediabetes She is on metformin  for prediabetes management. - Continue metformin  500 mg TID. -She will continue to follow a PC/[place] eating plan and increase her protein intake - Aim for 150 minutes of exercise per week.   Follow-up Reassess progress with Zepbound and lifestyle changes. - Follow up in 8 weeks to evaluate progress with Zepbound and lifestyle modifications.   She was informed of the importance of frequent follow up visits to maximize her success with intensive lifestyle modifications for her multiple health conditions.    Jasmine Mesi, MD

## 2024-02-09 ENCOUNTER — Telehealth (INDEPENDENT_AMBULATORY_CARE_PROVIDER_SITE_OTHER): Admitting: Psychology

## 2024-02-09 ENCOUNTER — Telehealth (INDEPENDENT_AMBULATORY_CARE_PROVIDER_SITE_OTHER): Payer: Self-pay | Admitting: Psychology

## 2024-02-09 NOTE — Progress Notes (Unsigned)
  Office: [phone]  /  Fax: [phone]    Date: February 09, 2024  Appointment Start Time: *** Duration: *** minutes Provider: Wyatt Fire, Psy.D. Type of Session: Individual Therapy  Location of Patient: {gbptloc:23249} (private location) Location of Provider: Provider's Home (private office) Type of Contact: Telepsychological Visit via MyChart Video Visit  Session Content:This provider called [name] at 9:04am as she did not present for today's appointment. A HIPAA compliant voicemail was left requesting a call back.  [name] was observed joining shortly after. As such, today's appointment was initiated *** minutes late. Kristi Hunter is a 83 y.o. female presenting for a follow-up appointment to address the previously established treatment goal of increasing coping skills.Today's appointment was a telepsychological visit. [name] provided verbal consent for today's telepsychological appointment and she is aware she is responsible for securing confidentiality on her end of the session. Prior to proceeding with today's appointment, [name]'s physical location at the time of this appointment was obtained as well a phone number she could be reached at in the event of technical difficulties. [name] and this provider participated in today's telepsychological service.   This provider conducted a brief check-in. *** [name] was receptive to today's appointment as evidenced by openness to sharing, responsiveness to feedback, and {gbreceptiveness:23401}.  Mental Status Examination:  Appearance: {Appearance:22431} Behavior: {Behavior:22445} Mood: {gbmood:21757} Affect: {Affect:22436} Speech: {Speech:22432} Eye Contact: {Eye Contact:22433} Psychomotor Activity: {Motor Activity:22434} Gait: {gbgait:23404} Thought Process: {thought process:22448}  Thought Content/Perception: {disturbances:22451} Orientation: {Orientation:22437} Memory/Concentration: {gbcognition:22449} Insight:  {Insight:22446} Judgment: {Insight:22446}  Interventions:  {Interventions for Progress Notes:23405}  DSM-5 Diagnosis(es): F50.89 Other Specified Feeding or Eating Disorder, Emotional Eating Behaviors  Treatment Goal & Progress: During the initial appointment with this provider, the following treatment goal was established: increase coping skills. [name] has demonstrated progress in her goal as evidenced by {gbtxprogress:22839}. [name] also {gbtxprogress2:22951}.  Plan: The next appointment is scheduled for *** at ***, which will be via MyChart Video Visit. The next session will focus on {Plan for Next Appointment:23400}.   Wyatt Fire, PsyD

## 2024-02-09 NOTE — Telephone Encounter (Signed)
  Office: [phone]  /  Fax: [phone]  Date of Call: February 09, 2024  Provider: Wyatt Fire, PsyD  CONTENT: This provider called [name] to check-in as she did not present for today's MyChart Video Visit appointment. A HIPAA compliant voicemail was left requesting a call back. Of note, this provider stayed on the MyChart Video Visit appointment for 5 minutes prior to signing off per the clinic's grace period policy.    PLAN: This provider will wait for Donata to call back. No further follow-up planned by this provider.

## 2024-03-12 ENCOUNTER — Encounter (INDEPENDENT_AMBULATORY_CARE_PROVIDER_SITE_OTHER): Payer: Self-pay | Admitting: Family Medicine

## 2024-03-16 ENCOUNTER — Telehealth (INDEPENDENT_AMBULATORY_CARE_PROVIDER_SITE_OTHER): Admitting: Psychology

## 2024-03-16 DIAGNOSIS — F5089 Other specified eating disorder: Secondary | ICD-10-CM

## 2024-03-16 NOTE — Telephone Encounter (Signed)
 Date: 03/16/2024 Enrollee Name: Kristi Hunter Member Number: [id number] Coverage of your drug was denied We denied coverage under Medicare Part D for the following drug(s) you or your prescribing provider asked for: ZEPBOUND  [employer] Why was coverage for this drug denied? We denied coverage for this drug because: Your plan's Medicare Part D drug plan cannot cover drugs used for loss of appetite (anorexia), weight loss, or weight gain. Your Medicare Part D drug plan was asked to cover the requested drug. The requested drug is used for loss of appetite (anorexia), weight loss, or to help you gain weight. Under section 1927(d)(2) of the Social Security Act, drugs used for loss of appetite (anorexia), weight loss, or weight gain are excluded from coverage under the Medicare Part D drug benefit. Since the requested indication is excluded from Medicare Part D coverage, the request for coverage under your Medicare Part D benefit is denied. Share this notice with your prescribing provider and discuss next steps. If your prescribing provider asked for coverage for this drug on your behalf, we already shared this denial notice with them.

## 2024-03-16 NOTE — Telephone Encounter (Addendum)
 Your information has been submitted to [employer]. [employer] will review the request and will issue a decision, typically within 1-3 days from your submission. You can check the updated outcome later by reopening this request.  If [employer] has not responded in 1-3 days or if you have any questions about your ePA request, please contact [employer] at [phone]. If you think there may be a problem with your PA request, use our live chat feature at the bottom right.  Prior shara was submitted for OSA.

## 2024-03-16 NOTE — Progress Notes (Signed)
  Office: [phone]  /  Fax: [phone]    Date: March 16, 2024  Appointment Start Time: 9:01am Duration: 22 minutes Provider: Wyatt Fire, Psy.D. Type of Session: Individual Therapy  Location of Patient: Home (private location) Location of Provider: Provider's Home (private office) Type of Contact: Telepsychological Visit via MyChart Video Visit  Session Content: [name] is a 83 y.o. female presenting for a follow-up appointment to address the previously established treatment goal of increasing coping skills. Today's appointment was a telepsychological visit. Romayne provided verbal consent for today's telepsychological appointment and she is aware she is responsible for securing confidentiality on her end of the session. Prior to proceeding with today's appointment, [name]'s physical location at the time of this appointment was obtained as well a phone number she could be reached at in the event of technical difficulties. [name] and this provider participated in today's telepsychological service.   This provider conducted a brief check-in. [name] stated she loses weight and gains weight again, noting nothing is helping and she is very frustrated. Further explored. She shared she is focusing on making better choices and increased her engagement in physical activity. Explored her recent eating habits, including times. Discussed the importance of eating regularly and enough as well as the importance of protein intake. Engaged [name] in problem solving to help her be more mindful about the aforementioned. Overall, [name] was receptive to today's appointment as evidenced by openness to sharing, responsiveness to feedback, and willingness to implement discussed strategies .  Mental Status Examination:  Appearance: neat Behavior: appropriate to circumstances Mood: frustrated Affect: mood congruent Speech: WNL Eye Contact: appropriate Psychomotor Activity: WNL Gait:  unable to assess Thought Process: linear, logical, and goal directed and no evidence or endorsement of suicidal, homicidal, and self-harm ideation, plan and intent  Thought Content/Perception: no hallucinations, delusions, bizarre thinking or behavior endorsed or observed Orientation: AAOx4 Memory/Concentration: intact Insight: fair Judgment: fair  Interventions:  Conducted a brief chart review Provided empathic reflections and validation Provided positive reinforcement Employed supportive psychotherapy interventions to facilitate reduced distress and to improve coping skills with identified stressors Engaged patient in problem solving  DSM-5 Diagnosis(es): F50.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 [name] has demonstrated progress in her goal as evidenced by increased awareness of hunger patterns. [name] also continues to demonstrate willingness to engage in learned skill(s) (e.g., implementing vacation strategies; utilizing non-dominant hand to eat to slow down eating).    Plan: The next appointment is scheduled for 03/30/2024 at 9am, which will be via MyChart Video Visit. The next session will focus on working towards the established treatment goal. [name] will f/u with her sleep doctor to discuss issues with CPAP.   Wyatt Fire, PsyD

## 2024-03-16 NOTE — Telephone Encounter (Signed)
 Appeal papers have been faxed for the patient.  The medication was denied per their reason for weight loss coverage.  When the prior auth was submitted, it was submitted for OSA.  We are now awaiting their decision.

## 2024-03-17 NOTE — Telephone Encounter (Signed)
 Zepbound  has been approved from 08/13/2023-08/11/2024. Papers received has been sent to medical records for scanning.

## 2024-03-25 ENCOUNTER — Ambulatory Visit (INDEPENDENT_AMBULATORY_CARE_PROVIDER_SITE_OTHER): Admitting: Family Medicine

## 2024-03-25 ENCOUNTER — Encounter (INDEPENDENT_AMBULATORY_CARE_PROVIDER_SITE_OTHER): Payer: Self-pay | Admitting: Family Medicine

## 2024-03-25 VITALS — BP 129/76 | HR 87 | Temp 97.5°F | Ht 63.0 in | Wt 249.0 lb

## 2024-03-25 DIAGNOSIS — G4733 Obstructive sleep apnea (adult) (pediatric): Secondary | ICD-10-CM

## 2024-03-25 DIAGNOSIS — K5900 Constipation, unspecified: Secondary | ICD-10-CM

## 2024-03-25 DIAGNOSIS — E669 Obesity, unspecified: Secondary | ICD-10-CM

## 2024-03-25 DIAGNOSIS — R7303 Prediabetes: Secondary | ICD-10-CM

## 2024-03-25 DIAGNOSIS — Z6841 Body Mass Index (BMI) 40.0 and over, adult: Secondary | ICD-10-CM

## 2024-03-25 DIAGNOSIS — K5901 Slow transit constipation: Secondary | ICD-10-CM

## 2024-03-25 MED ORDER — TIRZEPATIDE-WEIGHT MANAGEMENT 5 MG/0.5ML ~~LOC~~ SOLN
5.0000 mg | SUBCUTANEOUS | 0 refills | Status: DC
Start: 2024-03-25 — End: 2024-05-25

## 2024-03-25 MED ORDER — METFORMIN HCL 500 MG PO TABS: 500.0000 mg | ORAL_TABLET | Freq: Three times a day (TID) | ORAL | 0 refills | Status: DC

## 2024-03-25 NOTE — Progress Notes (Signed)
 Office: [phone]  /  Fax: [phone]  WEIGHT SUMMARY AND BIOMETRICS  Anthropometric Measurements Height: 5' 3 (1.6 m) Weight: 249 lb (112.9 kg) BMI (Calculated): 44.12 Weight at Last Visit: 251 lb Weight Lost Since Last Visit: 2 lb Weight Gained Since Last Visit: 0 Starting Weight: 267 lb Total Weight Loss (lbs): 18 lb (8.165 kg) Peak Weight: 280 lb   Body Composition  Body Fat %: 58.2 % Fat Mass (lbs): 145 lbs Muscle Mass (lbs): 98.8 lbs Visceral Fat Rating : 23   Other Clinical Data Fasting: no Labs: no Today's Visit #: 44 Starting Date: 02/22/20    Chief Complaint: OBESITY    History of Present Illness Kristi Hunter is an 83 year old female with obesity, prediabetes, and obstructive sleep apnea who presents for a follow-up on her treatment plan and progress.  She has been focusing on portion control and making healthier food choices, although her living situation makes it challenging to adhere to a structured eating plan. She has gained two pounds in the last month. She exercises twice a week for about forty-five minutes and achieves approximately five thousand steps daily. She is increasing her protein intake by incorporating more salads, tuna fish snacks, and hard-boiled eggs into her diet. She has switched from bagels to English muffins with peanut butter for breakfast. She participates in pool exercises twice a week and drumming sessions for upper cardio, with plans to include more cardio outside the pool.  For her prediabetes, she is taking metformin  but experiences nausea with three doses, so she has reduced it to two doses, which alleviates the nausea. She now experiences constipation, going less frequently than before starting metformin .  Regarding her obstructive sleep apnea, she was prescribed Zepbound  at her last visit. Although her insurance covers it, the copay is high, and she has only obtained a one-month supply. She has taken one  dose so far without gastrointestinal issues. She uses her CPAP machine for an average of four hours per night, occasionally skipping a day.      PHYSICAL EXAM:  Blood pressure 129/76, pulse 87, temperature (!) 97.5 F (36.4 C), height 5' 3 (1.6 m), weight 249 lb (112.9 kg), SpO2 94%. Body mass index is 44.11 kg/m.  DIAGNOSTIC DATA REVIEWED:  BMET    Component Value Date/Time   NA 144 06/17/2023 1105   K 3.5 06/17/2023 1105   CL 100 06/17/2023 1105   CO2 31 (H) 06/17/2023 1105   GLUCOSE 103 (H) 06/17/2023 1105   GLUCOSE 102 (H) 03/18/2023 0845   BUN 18 06/17/2023 1105   CREATININE 0.60 06/17/2023 1105   CREATININE 0.76 12/11/2021 1226   CALCIUM  9.2 06/17/2023 1105   GFRNONAA >60 03/18/2023 0845   GFRNONAA >60 12/11/2021 1226   GFRAA 104 02/22/2020 1339   Lab Results  Component Value Date   HGBA1C 6.1 (H) 06/17/2023   HGBA1C 6.0 01/13/2019   Lab Results  Component Value Date   INSULIN  22.5 02/20/2022   INSULIN  21.5 02/22/2020   Lab Results  Component Value Date   TSH 3.10 08/23/2022   CBC    Component Value Date/Time   WBC 7.5 03/18/2023 0845   RBC 4.29 03/18/2023 0845   HGB 12.1 03/18/2023 0845   HGB 12.6 02/20/2022 0942   HCT 38.1 03/18/2023 0845   HCT 39.6 02/20/2022 0942   PLT 279 03/18/2023 0845   PLT 318 02/20/2022 0942   MCV 88.8 03/18/2023 0845   MCV 87 02/20/2022 0942   MCH 28.2 03/18/2023  0845   MCHC 31.8 03/18/2023 0845   RDW 13.4 03/18/2023 0845   RDW 13.0 02/20/2022 0942   Iron Studies No results found for: IRON, TIBC, FERRITIN, IRONPCTSAT Lipid Panel     Component Value Date/Time   CHOL 161 08/23/2022 0000   CHOL 159 02/20/2022 0942   TRIG 58 08/23/2022 0000   HDL 64 08/23/2022 0000   HDL 49 02/20/2022 0942   CHOLHDL 3 01/13/2019 0913   VLDL 14.4 01/13/2019 0913   LDLCALC 84 08/23/2022 0000   LDLCALC 98 02/20/2022 0942   Hepatic Function Panel     Component Value Date/Time   PROT 6.4 06/17/2023 1105   ALBUMIN 3.6  (L) 06/17/2023 1105   AST 18 06/17/2023 1105   AST 19 12/11/2021 1226   ALT 16 06/17/2023 1105   ALT 17 12/11/2021 1226   ALKPHOS 59 06/17/2023 1105   BILITOT <0.2 06/17/2023 1105   BILITOT 0.3 12/11/2021 1226   BILIDIR 0.1 03/15/2022 1149      Component Value Date/Time   TSH 3.10 08/23/2022 0000   TSH 1.470 02/22/2020 1339   Nutritional Lab Results  Component Value Date   VD25OH 30.9 06/17/2023   VD25OH 32.7 02/20/2022   VD25OH 35.1 12/25/2021     Assessment and Plan Assessment & Plan Obesity Obesity management is ongoing. She has gained two pounds since the last visit. She is unable to follow a structured eating plan due to her living situation but is working on portion control and Oncologist. She is exercising twice a week for 45 minutes and achieving about 5,000 steps daily. She is on Zepbound , which was recently approved by her insurance, but the copay is high. She has taken one dose of Zepbound  and has not experienced significant GI issues yet. The plan is to titrate the dose based on her response over the next four weeks. The Zepbound  dose will be adjusted based on her response, with the potential to reduce metformin  as Zepbound  dose increases. - Continue Zepbound , titrate dose based on response after four weeks - Encourage portion control and smart food choices - Encourage exercise, aim for 5,000 steps daily - Discussed high copay of Zepbound  and insurance coverage  Prediabetes Prediabetes is being managed with metformin . She experiences nausea with three doses of metformin  but tolerates two doses well. The plan is to reduce metformin  as Zepbound  dose increases, as Zepbound  may reduce the need for metformin . - Continue metformin  at two doses daily - Adjust metformin  dose as Zepbound  dose increases  Obstructive sleep apnea Obstructive sleep apnea is being managed with CPAP therapy. She uses the CPAP for an average of four hours per night, which meets the  minimum requirement. She occasionally skips a day but compensates by using it longer on other days. - Continue CPAP therapy, aim for at least four hours per night - Monitor CPAP usage compliance  Constipation Constipation is likely related to metformin  and the effects of Zepbound , which slows down the GI tract. She reports infrequent bowel movements and a sensation of incomplete evacuation. Miralax is recommended to manage constipation without forcing bowel movements. Miralax does not enter the bloodstream, reducing the risk of interactions. - Start Miralax daily to manage constipation - Monitor bowel movement frequency and consistency     She was informed of the importance of frequent follow up visits to maximize her success with intensive lifestyle modifications for her multiple health conditions.    Louann Penton, MD

## 2024-03-28 ENCOUNTER — Other Ambulatory Visit (INDEPENDENT_AMBULATORY_CARE_PROVIDER_SITE_OTHER): Payer: Self-pay | Admitting: Family Medicine

## 2024-03-28 DIAGNOSIS — J3089 Other allergic rhinitis: Secondary | ICD-10-CM

## 2024-03-30 ENCOUNTER — Telehealth (INDEPENDENT_AMBULATORY_CARE_PROVIDER_SITE_OTHER): Admitting: Psychology

## 2024-03-30 DIAGNOSIS — F5089 Other specified eating disorder: Secondary | ICD-10-CM | POA: Diagnosis not present

## 2024-03-30 NOTE — Progress Notes (Signed)
  Office: [phone]  /  Fax: [phone]    Date: March 30, 2024  Appointment Start Time: 9:00am Duration: 38 minutes Provider: Wyatt Fire, Psy.D. Type of Session: Individual Therapy  Location of Patient: Home (private location) Location of Provider: Provider's Home (private office) Type of Contact: Telepsychological Visit via MyChart Video Visit  Session Content: Kristi Hunter is a 83 y.o. female presenting for a follow-up appointment to address the previously established treatment goal of increasing coping skills. Today's appointment was a telepsychological visit. Magdelyn provided verbal consent for today's telepsychological appointment and she is aware she is responsible for securing confidentiality on her end of the session. Prior to proceeding with today's appointment, Avalon's physical location at the time of this appointment was obtained as well a phone number she could be reached at in the event of technical difficulties. Luverna and this provider participated in today's telepsychological service.   This provider conducted a brief check-in. Kaydan shared, I'm not doing well.  She explained she suffered a fall in the parking lot, noting I'm black and blue and in terrible pain. Rhylan stated she saw the nurse where she lives and her food is delivered to her. Additionally, Jalexia indicated ongoing familial support. Given current injuries resulting in limited mobility, discussed triggers for emotional eating behaviors. Annamaria was engaged in problem solving to develop a plan to help cope with urges/cravings involving activities to relax, activities to distract, comforting places, people to call and connect with, and activities that help soothe senses. She provided verbal consent for this provider to send the developed plan via email. Overall, Therisa was receptive to today's appointment as evidenced by openness to sharing, responsiveness to feedback, and willingness  to implement discussed strategies .  Mental Status Examination:  Appearance: neat Behavior: appropriate to circumstances Mood: neutral Affect: mood congruent Speech: WNL Eye Contact: appropriate Psychomotor Activity: WNL Gait: unable to assess Thought Process: linear, logical, and goal directed and no evidence or endorsement of suicidal, homicidal, and self-harm ideation, plan and intent  Thought Content/Perception: no hallucinations, delusions, bizarre thinking or behavior endorsed or observed Orientation: AAOx4 Memory/Concentration: intact Insight: fair Judgment: fair  Interventions:  Conducted a brief chart review Provided empathic reflections and validation Provided positive reinforcement Employed supportive psychotherapy interventions to facilitate reduced distress and to improve coping skills with identified stressors Engaged patient in problem solving  DSM-5 Diagnosis(es): 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 Greer has demonstrated progress in her goal as evidenced by increased awareness of hunger patterns. Anayeli also continues to demonstrate willingness to engage in learned skill(s) (e.g., implementing vacation strategies; utilizing non-dominant hand to eat to slow down eating).   Plan: The next appointment is scheduled for 04/26/2024 at 9am, which will be via MyChart Video Visit. The next session will focus on working towards the established treatment goal.   Wyatt Fire, PsyD

## 2024-03-31 ENCOUNTER — Telehealth: Payer: Self-pay

## 2024-03-31 NOTE — Telephone Encounter (Signed)
 Pt called to confirm appt for 11/23.

## 2024-04-14 ENCOUNTER — Ambulatory Visit (HOSPITAL_BASED_OUTPATIENT_CLINIC_OR_DEPARTMENT_OTHER): Admitting: Student

## 2024-04-26 ENCOUNTER — Telehealth (INDEPENDENT_AMBULATORY_CARE_PROVIDER_SITE_OTHER): Admitting: Psychology

## 2024-04-26 DIAGNOSIS — F5089 Other specified eating disorder: Secondary | ICD-10-CM | POA: Diagnosis not present

## 2024-04-26 NOTE — Progress Notes (Signed)
  Office: [phone]  /  Fax: [phone]    Date: April 26, 2024  Appointment Start Time: 9:01am Duration: 27 minutes Provider: Wyatt Fire, Psy.D. Type of Session: Individual Therapy  Location of Patient: Home (private location) Location of Provider: Provider's Home (private office) Type of Contact: Telepsychological Visit via MyChart Video Visit  Session Content: Kristi Hunter is a 83 y.o. female presenting for a follow-up appointment to address the previously established treatment goal of increasing coping skills. Today's appointment was a telepsychological visit. Kristi Hunter provided verbal consent for today's telepsychological appointment and she is aware she is responsible for securing confidentiality on her end of the session. Prior to proceeding with today's appointment, Kristi Hunter's physical location at the time of this appointment was obtained as well a phone number she could be reached at in the event of technical difficulties. Kristi Hunter and this provider participated in today's telepsychological service.   This provider conducted a brief check-in. Kristi Hunter provided updates regarding her recent fall as well as recent events, noting an injury during a trip. She acknowledged deviations with her eating habits while traveling, noting there was a lot of fried foods resulting in acid and indigestion. Reviewed the previously developed plan to assist with coping with triggers to avoid engagement in emotional eating behaviors. She shared examples of engaging in identified activities (e.g., reading magazines). She also noted she reads her plan every morning as a refresher. This provider recommended she take a picture of her plan for easy access when she is not home. Kristi Hunter agreed. While she continues to recover from the fall and recent injury, she was encouraged to continue utilizing her plan to avoid engagement in emotional eating behaviors secondary to boredom and focus on making  better choices and engaging in portion control. Overall, Kristi Hunter was receptive to today's appointment as evidenced by openness to sharing, responsiveness to feedback, and willingness to continue engaging in learned skills.  Mental Status Examination:  Appearance: neat Behavior: appropriate to circumstances Mood: neutral Affect: mood congruent Speech: WNL Eye Contact: appropriate Psychomotor Activity: WNL Gait: unable to assess Thought Process: linear, logical, and goal directed and no evidence or endorsement of suicidal, homicidal, and self-harm ideation, plan and intent  Thought Content/Perception: no hallucinations, delusions, bizarre thinking or behavior endorsed or observed Orientation: AAOx4 Memory/Concentration: intact Insight: good Judgment: good  Interventions:  Conducted a brief chart review Provided empathic reflections and validation Reviewed content from the previous session Provided positive reinforcement Employed supportive psychotherapy interventions to facilitate reduced distress and to improve coping skills with identified stressors  DSM-5 Diagnosis(es): F50.89 Other Specified Feeding or Eating Disorder, Emotional Eating Behaviors  Treatment Goal & Progress: Kristi Hunter re-established care with this provider and following treatment goal was established: increase coping skills. Kristi Hunter has demonstrated progress in her goal as evidenced by increased awareness of hunger patterns. Kristi Hunter also continues to demonstrate willingness to engage in learned skill(s) (e.g., implementing vacation strategies; utilizing non-dominant hand to eat to slow down eating).   Plan: The next appointment is scheduled for 05/10/2024 at 9am, which will be via MyChart Video Visit. The next session will focus on working towards the established treatment goal and possible termination planning.    Wyatt Fire, PsyD

## 2024-05-06 ENCOUNTER — Other Ambulatory Visit: Payer: Self-pay

## 2024-05-06 ENCOUNTER — Other Ambulatory Visit: Payer: Self-pay | Admitting: Hematology and Oncology

## 2024-05-06 DIAGNOSIS — R600 Localized edema: Secondary | ICD-10-CM

## 2024-05-06 MED ORDER — FUROSEMIDE 20 MG PO TABS: 20.0000 mg | ORAL_TABLET | Freq: Every day | ORAL | 0 refills | Status: DC

## 2024-05-10 ENCOUNTER — Telehealth (INDEPENDENT_AMBULATORY_CARE_PROVIDER_SITE_OTHER): Admitting: Psychology

## 2024-05-10 ENCOUNTER — Other Ambulatory Visit: Payer: Self-pay | Admitting: General Practice

## 2024-05-10 DIAGNOSIS — F5089 Other specified eating disorder: Secondary | ICD-10-CM | POA: Diagnosis not present

## 2024-05-10 DIAGNOSIS — R600 Localized edema: Secondary | ICD-10-CM

## 2024-05-10 NOTE — Progress Notes (Signed)
  Office: [phone]  /  Fax: [phone]    Date: May 10, 2024  Appointment Start Time: 9:02am Duration: 25 minutes Provider: Wyatt Fire, Psy.D. Type of Session: Individual Therapy  Location of Patient: Home (private location) Location of Provider: Provider's Home (private office) Type of Contact: Telepsychological Visit via MyChart Video Visit  Session Content: Kristi Hunter is a 84 y.o. female presenting for a follow-up appointment to address the previously established treatment goal of increasing coping skills. Today's appointment was a telepsychological visit. Kristi Hunter provided verbal consent for today's telepsychological appointment and she is aware she is responsible for securing confidentiality on her end of the session. Prior to proceeding with today's appointment, Kristi Hunter's physical location at the time of this appointment was obtained as well a phone number she could be reached at in the event of technical difficulties. Kristi Hunter and this provider participated in today's telepsychological service.   This provider conducted a brief check-in. Kristi Hunter described an increase in mobility as she continues to heal. She also discussed the recent increase in Zepbound  seems to make a difference. Reflected on progress to date, including non-scale victories. Furthermore, termination planning was discussed. Kristi Hunter was receptive to a follow-up appointment in 3-4 weeks and an additional follow-up/termination appointment in 4-5 weeks after that. Overall, Kristi Hunter was receptive to today's appointment as evidenced by openness to sharing, responsiveness to feedback, and willingness to continue engaging in learned skills.  Mental Status Examination:  Appearance: neat Behavior: appropriate to circumstances Mood: neutral Affect: mood congruent Speech: WNL Eye Contact: appropriate Psychomotor Activity: WNL Gait: unable to assess Thought Process: linear, logical, and goal directed and  no evidence or endorsement of suicidal, homicidal, and self-harm ideation, plan and intent  Thought Content/Perception: no hallucinations, delusions, bizarre thinking or behavior endorsed or observed Orientation: AAOx4 Memory/Concentration: intact Insight: good Judgment: good  Interventions:  Conducted a brief chart review Provided empathic reflections and validation Provided positive reinforcement Employed supportive psychotherapy interventions to facilitate reduced distress and to improve coping skills with identified stressors Reflected on progress to date Discussed termination planning   DSM-5 Diagnosis(es): F50.89 Other Specified Feeding or Eating Disorder, Emotional Eating Behaviors  Treatment Goal & Progress: Kristi Hunter re-established care with this provider and following treatment goal was established: increase coping skills. Kristi Hunter has demonstrated progress in her goal as evidenced by increased awareness of hunger patterns and reduction in engagement in emotional eating behaviors. Kristi Hunter also continues to demonstrate willingness to engage in learned skill(s) (e.g., implementing vacation strategies; utilizing non-dominant hand to eat to slow down eating).   Plan: The next appointment is scheduled for 05/31/2024 at 10am, which will be via MyChart Video Visit. The next session will focus on working towards the established treatment goal.   Wyatt Fire, PsyD

## 2024-05-25 ENCOUNTER — Ambulatory Visit (INDEPENDENT_AMBULATORY_CARE_PROVIDER_SITE_OTHER): Admitting: Family Medicine

## 2024-05-25 ENCOUNTER — Encounter (INDEPENDENT_AMBULATORY_CARE_PROVIDER_SITE_OTHER): Payer: Self-pay | Admitting: Family Medicine

## 2024-05-25 DIAGNOSIS — E669 Obesity, unspecified: Secondary | ICD-10-CM | POA: Diagnosis not present

## 2024-05-25 DIAGNOSIS — T383X5A Adverse effect of insulin and oral hypoglycemic [antidiabetic] drugs, initial encounter: Secondary | ICD-10-CM

## 2024-05-25 DIAGNOSIS — K5903 Drug induced constipation: Secondary | ICD-10-CM | POA: Diagnosis not present

## 2024-05-25 DIAGNOSIS — Z6841 Body Mass Index (BMI) 40.0 and over, adult: Secondary | ICD-10-CM

## 2024-05-25 DIAGNOSIS — G4733 Obstructive sleep apnea (adult) (pediatric): Secondary | ICD-10-CM

## 2024-05-25 DIAGNOSIS — R7303 Prediabetes: Secondary | ICD-10-CM

## 2024-05-25 MED ORDER — TIRZEPATIDE-WEIGHT MANAGEMENT 5 MG/0.5ML ~~LOC~~ SOLN: 5.0000 mg | SUBCUTANEOUS | 0 refills | Status: DC

## 2024-05-25 MED ORDER — METFORMIN HCL 500 MG PO TABS: 500.0000 mg | ORAL_TABLET | Freq: Two times a day (BID) | ORAL | 0 refills | Status: DC

## 2024-05-25 NOTE — Progress Notes (Signed)
 Office: [phone]  /  Fax: [phone]  WEIGHT SUMMARY AND BIOMETRICS  Anthropometric Measurements Height: 5' 3 (1.6 m) Weight: 241 lb (109.3 kg) BMI (Calculated): 42.7 Weight at Last Visit: 249lb Weight Lost Since Last Visit: 8lb Weight Gained Since Last Visit: 0lb Starting Weight: 267lb Total Weight Loss (lbs): 26 lb (11.8 kg) Peak Weight: 280lb   Body Composition  Body Fat %: 55.4 % Fat Mass (lbs): 133.6 lbs Muscle Mass (lbs): 102 lbs Visceral Fat Rating : 21   Other Clinical Data RMR: [id number] Fasting: No Labs: No Today's Visit #: 45 Starting Date: 02/22/20    Chief Complaint: OBESITY   History of Present Illness Kristi Hunter is an 83 year old female with obesity and prediabetes who presents for obesity treatment and progress assessment.  She has been intermittently following a portion control Smart Choices eating plan and has lost eight pounds over the last two months. Her diet focuses on portion control rather than eliminating specific foods. She enjoys ice cream, eats very little fried food, often has salmon or filet mignon, and prefers sweet potatoes over white potatoes. She dislikes broccoli but enjoys homemade soup for lunch during the winter. Breakfast typically includes a hard-boiled egg several times a week, sometimes with an English muffin or a bagel, and she adjusts her lunch portions accordingly. She is not currently engaging in any exercise.  She is currently taking metformin  500 mg twice daily for her prediabetes and requests a refill. She is also on Zepbound  5 mg weekly and requests a refill. She experiences constipation and has used Miralax in the past to manage this. She has not experienced nausea but did have a day of extreme fatigue, which she describes as feeling like 'every energy had been zapped out of me.' She was tested for COVID-19, which was negative, and she felt better the following day.  She uses a CPAP machine for  obstructive sleep apnea and her Zepbound  is being prescribed for her OSA  She lives at [place] and has recently changed her pharmacy to [facility], which delivers to her residence. She experienced issues with her previous pharmacy, [facility], regarding communication. She plans to travel to [place]  and [place]  in the coming months, indicating an active lifestyle despite her medical conditions.      PHYSICAL EXAM:  Blood pressure 121/82, pulse 72, temperature 98.7 F (37.1 C), height 5' 3 (1.6 m), weight 241 lb (109.3 kg), SpO2 99%. Body mass index is 42.69 kg/m.  DIAGNOSTIC DATA REVIEWED:  BMET    Component Value Date/Time   NA 144 06/17/2023 1105   K 3.5 06/17/2023 1105   CL 100 06/17/2023 1105   CO2 31 (H) 06/17/2023 1105   GLUCOSE 103 (H) 06/17/2023 1105   GLUCOSE 102 (H) 03/18/2023 0845   BUN 18 06/17/2023 1105   CREATININE 0.60 06/17/2023 1105   CREATININE 0.76 12/11/2021 1226   CALCIUM  9.2 06/17/2023 1105   GFRNONAA >60 03/18/2023 0845   GFRNONAA >60 12/11/2021 1226   GFRAA 104 02/22/2020 1339   Lab Results  Component Value Date   HGBA1C 6.1 (H) 06/17/2023   HGBA1C 6.0 01/13/2019   Lab Results  Component Value Date   INSULIN  22.5 02/20/2022   INSULIN  21.5 02/22/2020   Lab Results  Component Value Date   TSH 3.10 08/23/2022   CBC    Component Value Date/Time   WBC 7.5 03/18/2023 0845   RBC 4.29 03/18/2023 0845   HGB 12.1 03/18/2023 0845   HGB  12.6 02/20/2022 0942   HCT 38.1 03/18/2023 0845   HCT 39.6 02/20/2022 0942   PLT 279 03/18/2023 0845   PLT 318 02/20/2022 0942   MCV 88.8 03/18/2023 0845   MCV 87 02/20/2022 0942   MCH 28.2 03/18/2023 0845   MCHC 31.8 03/18/2023 0845   RDW 13.4 03/18/2023 0845   RDW 13.0 02/20/2022 0942   Iron Studies No results found for: IRON, TIBC, FERRITIN, IRONPCTSAT Lipid Panel     Component Value Date/Time   CHOL 161 08/23/2022 0000   CHOL 159 02/20/2022 0942   TRIG 58 08/23/2022 0000   HDL 64 08/23/2022  0000   HDL 49 02/20/2022 0942   CHOLHDL 3 01/13/2019 0913   VLDL 14.4 01/13/2019 0913   LDLCALC 84 08/23/2022 0000   LDLCALC 98 02/20/2022 0942   Hepatic Function Panel     Component Value Date/Time   PROT 6.4 06/17/2023 1105   ALBUMIN 3.6 (L) 06/17/2023 1105   AST 18 06/17/2023 1105   AST 19 12/11/2021 1226   ALT 16 06/17/2023 1105   ALT 17 12/11/2021 1226   ALKPHOS 59 06/17/2023 1105   BILITOT <0.2 06/17/2023 1105   BILITOT 0.3 12/11/2021 1226   BILIDIR 0.1 03/15/2022 1149      Component Value Date/Time   TSH 3.10 08/23/2022 0000   TSH 1.470 02/22/2020 1339   Nutritional Lab Results  Component Value Date   VD25OH 30.9 06/17/2023   VD25OH 32.7 02/20/2022   VD25OH 35.1 12/25/2021     Assessment and Plan Assessment & Plan Obesity management AND OSA She has lost eight pounds over the last two months with intermittent adherence to a portion control Smart Choices eating plan. She is not currently engaging in exercise. She is on Zepbound  5 mg weekly and reports constipation as a side effect. No nausea reported. She is focusing on portion control and is mindful of her carbohydrate intake. - Refill Zepbound  5 mg weekly - Continue using CPAP regularly - Encourage continuation of portion control eating plan to help with weight loss - Continue to work on increasing water, vegetables and lean protein,.  Prediabetes management She is on metformin  500 mg twice daily and is monitoring her carbohydrate intake, understanding the importance of portion control. - Refill metformin  500 mg twice daily   Constipation secondary to obesity pharmacotherapy Constipation is a side effect of Zepbound , described as having 'rocks' for bowel movements. She has previously used Miralax with success. The constipation is attributed to the slowing of the GI tract by Zepbound , leading to increased water absorption in the large intestine. - Recommend daily use of Miralax to prevent constipation -  Advise to start with half a dose of Miralax daily and adjust as needed - Ensure adequate hydration      [name] was informed of the importance of frequent follow up visits to maximize her success with intensive lifestyle modifications for her obesity and obesity related health conditions as recommended by USPSTF and CMS guidelines   Louann Penton, MD

## 2024-05-31 ENCOUNTER — Telehealth (INDEPENDENT_AMBULATORY_CARE_PROVIDER_SITE_OTHER): Admitting: Psychology

## 2024-05-31 DIAGNOSIS — F5089 Other specified eating disorder: Secondary | ICD-10-CM | POA: Diagnosis not present

## 2024-05-31 NOTE — Progress Notes (Signed)
  Office: [phone]  /  Fax: [phone]    Date: May 31, 2024  Appointment Start Time: 9:04am Duration: 30 minutes Provider: Wyatt Fire, Psy.D. Type of Session: Individual Therapy  Location of Patient: Home (private location) Location of Provider: [facility] clinic at [facility] Type of Contact: Telepsychological Visit via MyChart Video Visit  Session Content: Kristi Hunter is a 83 y.o. female presenting for a follow-up appointment to address the previously established treatment goal of increasing coping skills. Today's appointment was a telepsychological visit. Kristi Hunter provided verbal consent for today's telepsychological appointment and she is aware she is responsible for securing confidentiality on her end of the session. Prior to proceeding with today's appointment, Kristi Hunter's physical location at the time of this appointment was obtained as well a phone number she could be reached at in the event of technical difficulties. Kristi Hunter and this provider participated in today's telepsychological service.   This provider conducted a brief check-in. Kristi Hunter stated an increase in Zepbound  dose helped her lose 8 additional pounds. She also shared about her upcoming trip to [place]. Discussed strategies for meeting her goals with the clinic while she is traveling. Session concluded with this provider sharing about recent Medicare changes related to telehealth/in-person [facility] visits. Overall, [name] was receptive to today's appointment as evidenced by openness to sharing, responsiveness to feedback, and willingness to continue engaging in learned skills.  Mental Status Examination:  Appearance: neat Behavior: appropriate to circumstances Mood: neutral Affect: mood congruent Speech: WNL Eye Contact: appropriate Psychomotor Activity: WNL Gait: unable to assess Thought Process: linear, logical, and goal directed and no evidence or endorsement of suicidal, homicidal, and self-harm ideation,  plan and intent  Thought Content/Perception: no hallucinations, delusions, bizarre thinking or behavior endorsed or observed Orientation: AAOx4 Memory/Concentration: intact Insight: good Judgment: good  Interventions:  Conducted a brief chart review Provided empathic reflections and validation Provided positive reinforcement Employed supportive psychotherapy interventions to facilitate reduced distress and to improve coping skills with identified stressors Engaged patient in problem solving  DSM-5 Diagnosis(es): F50.89 Other Specified Feeding or Eating Disorder, Emotional Eating Behaviors  Treatment Goal & Progress: [name] re-established care with this provider and following treatment goal was established: increase coping skills. [name] has demonstrated progress in her goal as evidenced by increased awareness of hunger patterns and reduction in engagement in emotional eating behaviors. [name] also continues to demonstrate willingness to engage in learned skill(s) (e.g., implementing vacation strategies; utilizing non-dominant hand to eat to slow down eating).    Plan: The next appointment is scheduled for 06/29/2024 at 10am in-person at the [facility] location (address provided). The next session will focus on working towards the established treatment goal and termination.    Wyatt Fire, PsyD

## 2024-06-14 ENCOUNTER — Encounter: Payer: Self-pay | Admitting: Radiology

## 2024-06-16 ENCOUNTER — Telehealth: Payer: Self-pay | Admitting: *Deleted

## 2024-06-16 ENCOUNTER — Other Ambulatory Visit: Payer: Self-pay | Admitting: Interventional Radiology

## 2024-06-16 DIAGNOSIS — M1712 Unilateral primary osteoarthritis, left knee: Secondary | ICD-10-CM

## 2024-06-16 NOTE — Telephone Encounter (Signed)
 Called patient to schedule a follow up from Lt knee procedure 03/18/2023 with Dr Jennefer. Patient states she doesn't need a follow up at this time. Her knee is feeling better and she has a busy schedule coming up. I did advise if she has any future needs to call us .adin

## 2024-06-23 ENCOUNTER — Telehealth: Payer: Self-pay

## 2024-06-23 NOTE — Telephone Encounter (Signed)
 Spoke with patient and confirmed appointment on 11/13

## 2024-06-24 ENCOUNTER — Inpatient Hospital Stay: Payer: Medicare HMO | Attending: Hematology and Oncology | Admitting: Hematology and Oncology

## 2024-06-24 VITALS — BP 146/65 | HR 92 | Temp 98.0°F | Resp 17 | Wt 242.9 lb

## 2024-06-24 DIAGNOSIS — E669 Obesity, unspecified: Secondary | ICD-10-CM | POA: Diagnosis not present

## 2024-06-24 DIAGNOSIS — Z79810 Long term (current) use of selective estrogen receptor modulators (SERMs): Secondary | ICD-10-CM | POA: Diagnosis not present

## 2024-06-24 DIAGNOSIS — Z1721 Progesterone receptor positive status: Secondary | ICD-10-CM | POA: Diagnosis not present

## 2024-06-24 DIAGNOSIS — Z8542 Personal history of malignant neoplasm of other parts of uterus: Secondary | ICD-10-CM | POA: Insufficient documentation

## 2024-06-24 DIAGNOSIS — Z1732 Human epidermal growth factor receptor 2 negative status: Secondary | ICD-10-CM | POA: Diagnosis not present

## 2024-06-24 DIAGNOSIS — Z87891 Personal history of nicotine dependence: Secondary | ICD-10-CM | POA: Diagnosis not present

## 2024-06-24 DIAGNOSIS — Z90710 Acquired absence of both cervix and uterus: Secondary | ICD-10-CM | POA: Insufficient documentation

## 2024-06-24 DIAGNOSIS — C50411 Malignant neoplasm of upper-outer quadrant of right female breast: Secondary | ICD-10-CM | POA: Insufficient documentation

## 2024-06-24 DIAGNOSIS — Z170 Estrogen receptor positive status [ER+]: Secondary | ICD-10-CM | POA: Diagnosis not present

## 2024-06-24 DIAGNOSIS — Z803 Family history of malignant neoplasm of breast: Secondary | ICD-10-CM | POA: Insufficient documentation

## 2024-06-24 NOTE — Progress Notes (Signed)
 Oncology History  Malignant neoplasm of upper-outer quadrant of right breast in female, estrogen receptor positive (HCC)  02/01/2021 Imaging   EXAM: ULTRASOUND OF THE RIGHT BREAST  FINDINGS: Targeted ultrasound is performed, showing a 10 x 7 x 8 mm irregular hypoechoic mass right breast 10 o'clock position 1 cm from the nipple. No right axillary adenopathy.   IMPRESSION: Suspicious right breast mass 10 o'clock position.   02/05/2021 Initial Biopsy   Diagnosis Breast, right, needle core biopsy, 10 o'clock, 1cmfn - INVASIVE DUCTAL CARCINOMA WITH CALCIFICATIONS. SEE NOTE - DUCTAL CARCINOMA IN SITU, INTERMEDIATE GRADE Diagnosis Note Carcinoma measures 0.7 cm in greatest linear dimension and appears grade 2.  PROGNOSTIC INDICATORS Results: The tumor cells are EQUIVOCAL for Her2 (2+). Her2 by FISH will be performed and results reported separately. Estrogen Receptor: 100%, POSITIVE, STRONG STAINING INTENSITY Progesterone Receptor: 90%, POSITIVE, STRONG STAINING INTENSITY Proliferation Marker Ki67: 20%  FLUORESCENCE IN-SITU HYBRIDIZATION Results: GROUP 5: HER2 **NEGATIVE**   02/08/2021 Initial Diagnosis   Malignant neoplasm of upper-outer quadrant of right breast in female, estrogen receptor positive (HCC)   02/14/2021 Cancer Staging   Staging form: Breast, AJCC 8th Edition - Clinical stage from 02/14/2021: Stage IA (cT1b, cN0, cM0, G2, ER+, PR+, HER2-) - Signed by Layla Sandria BROCKS, MD on 02/14/2021 Stage prefix: Initial diagnosis Histologic grading system: 3 grade system   02/20/2021 Cancer Staging   Staging form: Breast, AJCC 8th Edition - Pathologic stage from 02/20/2021: Stage IA (pT1c, pN0, cM0, G2, ER+, PR+, HER2-) - Signed by Crawford Morna Pickle, NP on 08/07/2021 Histologic grading system: 3 grade system   02/20/2021 Definitive Surgery   FINAL MICROSCOPIC DIAGNOSIS:   A. BREAST, RIGHT, LUMPECTOMY:  - Invasive and in situ ductal carcinoma, 1.2 cm.  - Invasive carcinoma  focally 0.1 cm from medial margin and 0.2 cm from anterior margin.  - Biopsy site and biopsy clip.  - See oncology table.    02/20/2021 Oncotype testing   Oncotype DX was obtained on the final surgical sample and the recurrence score of 8 predicts a risk of recurrence outside the breast over the next 9 years of 3%, if the patient's only systemic therapy is an antiestrogen for 5 years.  It also predicts no benefit from chemotherapy.    02/23/2021 Genetic Testing   Negative hereditary cancer genetic testing: no pathogenic variants detected in [facility] CustomNext-Cancer +RNAinsight Panel.  The report date is February 23, 2021.    The CustomNext-Cancer +RNAinsight Panel offered by [facility] and includes sequencing and rearrangement analysis for the following 91 genes: AIP, ALK, APC, ATM, AXIN2, BAP1, BARD1, BLM, BMPR1A, BRCA1, BRCA2, BRIP1, CDC73, CDH1, CDK4, CDKN1B, CDKN2A, CHEK2, CTNNA1, DICER1, FANCC, FH, FLCN, GALNT12, KIF1B, LZTR1, MAX, MEN1, MET, MLH1, MRE11A, MSH2, MSH3, MSH6, MUTYH, NBN, NF1, NF2, NTHL1, PALB2, PHOX2B, PMS2, POT1, PRKAR1A, PTCH1, PTEN, RAD50, RAD51C, RAD51D, RB1, RECQL, RET, SDHA, SDHAF2, SDHB, SDHC, SDHD, SMAD4, SMARCA4, SMARCB1, SMARCE1, STK11, SUFU, TMEM127, TP53, TSC1, TSC2, VHL and XRCC2 (sequencing and deletion/duplication); CASR, CFTR, CPA1, CTRC, EGFR, EGLN1, FAM175A, HOXB13, KIT, MITF, MLH3, PALLD, PDGFRA, POLD1, POLE, PRSS1, RINT1, RPS20, SPINK1 and TERT (sequencing only); EPCAM and GREM1 (deletion/duplication only). RNA data is routinely analyzed for use in variant interpretation for all genes.   05/02/2021 - 04/2026 Anti-estrogen oral therapy   tamoxifen  started 05/02/2021             (A) s/p remote hysterectomy (for uterine cancer)             (B) osteoporosis with bone  density 2020 T score -2.6     INTERVAL HISTORY:   Discussed the use of AI scribe software for clinical note transcription with the patient, who gave verbal consent to proceed.  History of Present  Illness Asucena Galer is an 83 year old female with obesity and breast cancer on tamoxifen  who presents for follow-up regarding weight management and breast cancer surveillance.  She has experienced significant weight loss since her last visit, attributed to the use of Zepbound . Initially, she lost a pound per month on a lower dose but has been losing weight steadily on a higher dose. Her current weight is 238 pounds, down from 270 pounds, with a goal to reach below 200 pounds to qualify for knee surgery. She notes a plateau at 230 pounds despite reduced appetite and decreased cravings for sweets, particularly ice cream.  She continues to take tamoxifen  for breast cancer and reports no new side effects. She performs regular breast self-examinations and recently had a mammogram at [facility] in [place], which she was told was normal. She plans to continue her mammograms at the same location.  She has a history of pre-diabetes and takes metformin , which causes constipation. She has had recent blood work but is unsure if liver function tests were included.  In August, she had an accident resulting in 15 stitches, which are slow to heal. The slow healing is impacting her ability to exercise, particularly in the pool, which she prefers.  She is moving to a new apartment due to claustrophobia and the need for more natural light for her activities. She plans to move to a two-bedroom apartment with windows, as her current den lacks them, affecting her ability to work and paint.       ONCOLOGY TREATMENT TEAM:  1. Surgeon:  Dr. Aron at [facility] 2. Medical Oncologist: Dr. Loretha  3. Radiation Oncologist: Dr. Dewey    PAST MEDICAL/SURGICAL HISTORY:  Past Medical History:  Diagnosis Date   Anemia    Anxiety    Arthritis    several different parts of the body per pt 10/17/2022   Back pain    Breast cancer (HCC)    02/20/21   Cancer (HCC)    uterine   Chest pain     Chicken pox    Chronic kidney disease    Constipation    Emphysema of lung (HCC)    Family history of breast cancer 02/14/2021   Glaucoma    Hypertension    Joint pain    Lower extremity edema    Lymphedema    Osteoarthritis    Personal history of malignant neoplasm of uterus 02/14/2021   Pre-diabetes    Prediabetes    Shortness of breath    Sleep apnea    doesn't use CPAP machine anymore   Urine incontinence    Uterine cancer (HCC)    2011   UTI (urinary tract infection)    Vertigo    had vertigo last week. 10/17/2022   Vitamin B 12 deficiency    during pregnancy   Past Surgical History:  Procedure Laterality Date   ABDOMINAL HYSTERECTOMY  2011   BOTOX  INJECTION N/A 10/22/2022   Procedure: BOTOX  INJECTION 100 UNITS;  Surgeon: Elisabeth Valli BIRCH, MD;  Location: [facility];  Service: Urology;  Laterality: N/A;   BREAST LUMPECTOMY WITH RADIOACTIVE SEED LOCALIZATION Right 02/20/2021   Procedure: RIGHT BREAST LUMPECTOMY WITH RADIOACTIVE SEED LOCALIZATION;  Surgeon: Aron Shoulders, MD;  Location: [facility] OR;  Service: General;  Laterality: Right;   CYSTOSCOPY WITH INJECTION N/A 10/22/2022   Procedure: CYSTOSCOPY WITH INJECTION OF ALOXJFPI;  Surgeon: Elisabeth Valli BIRCH, MD;  Location: [facility] ;  Service: Urology;  Laterality: N/A;   IR ANGIOGRAM EXTREMITY RIGHT  03/18/2023   IR ANGIOGRAM SELECTIVE EACH ADDITIONAL VESSEL  03/18/2023   IR ANGIOGRAM SELECTIVE EACH ADDITIONAL VESSEL  03/18/2023   IR EMBO ARTERIAL NOT HEMORR HEMANG INC GUIDE ROADMAPPING  03/18/2023   IR RADIOLOGIST EVAL & MGMT  02/05/2023   IR RADIOLOGIST EVAL & MGMT  04/18/2023   IR RADIOLOGIST EVAL & MGMT  12/29/2023   IR US  GUIDE VASC ACCESS RIGHT  03/18/2023   REPLACEMENT TOTAL KNEE Right 2012     ALLERGIES:  Allergies  Allergen Reactions   Amoxicillin Nausea And Vomiting   Percocet [Oxycodone -Acetaminophen ] Other (See Comments)    hallucinations     CURRENT MEDICATIONS:  Outpatient  Encounter Medications as of 06/24/2024  Medication Sig   acetaminophen  (TYLENOL ) 650 MG CR tablet Take 650 mg by mouth every 8 (eight) hours as needed for pain. Takes usually once a day   alendronate  (FOSAMAX ) 70 MG tablet TAKE 1 TABLET (70 MG TOTAL) BY MOUTH EVERY 7 (SEVEN) DAYS. TAKE WITH A FULL GLASS OF WATER ON AN EMPTY STOMACH.   chlorthalidone  (HYGROTON ) 25 MG tablet Take 1 tablet (25 mg total) by mouth daily.   diclofenac Sodium (VOLTAREN) 1 % GEL Apply 2 g topically daily as needed (Osteoarthritis).   fluticasone  (FLONASE ) 50 MCG/ACT nasal spray Place 2 sprays into both nostrils in the morning and at bedtime.   furosemide  (LASIX ) 20 MG tablet Take 1 tablet (20 mg total) by mouth daily.   Melatonin 10 MG TABS Take 10 mg by mouth at bedtime as needed (sleep).   metFORMIN  (GLUCOPHAGE ) 500 MG tablet Take 1 tablet (500 mg total) by mouth 2 (two) times daily with a meal.   metroNIDAZOLE  (METROGEL ) 1 % gel Apply topically daily. (Patient taking differently: Apply 1 application  topically daily as needed (rosacea).)   Multiple Vitamin (MULTIVITAMIN) tablet Take 1 tablet by mouth daily.   tamoxifen  (NOLVADEX ) 20 MG tablet TAKE 1 TABLET BY MOUTH EVERY DAY   tirzepatide  5 MG/0.5ML injection vial Inject 5 mg into the skin once a week.   tiZANidine  (ZANAFLEX ) 2 MG tablet TAKE 1-2 TABLETS (2-4 MG TOTAL) BY MOUTH EVERY 8 (EIGHT) HOURS AS NEEDED.   traZODone  (DESYREL ) 50 MG tablet Take 50 mg by mouth at bedtime as needed.   TRELEGY ELLIPTA 100-62.5-25 MCG/ACT AEPB    No facility-administered encounter medications on file as of 06/24/2024.     ONCOLOGIC FAMILY HISTORY:  Family History  Problem Relation Age of Onset   Heart attack Mother    Heart disease Mother    Hyperlipidemia Mother    Hypertension Mother    Stroke Mother    Miscarriages / Stillbirths Mother    Diabetes Mother    Thyroid  disease Mother    Cancer Mother    Obesity Mother    Melanoma Mother        dx after 21, sun-exposed  areas   Cancer Father        skin   Alcohol abuse Father    Asthma Father    COPD Father    Heart attack Father    Heart disease Father    Obesity Father    Melanoma Father        dx after 38, back   Alcohol  abuse Brother    Arthritis Brother    Drug abuse Brother    Alcohol abuse Brother    Cancer Brother        ? esophageal, d. 40   Breast cancer Paternal Aunt 24   Arthritis Maternal Grandmother    Diabetes Maternal Grandmother    Hyperlipidemia Maternal Grandmother    Colon cancer Maternal Grandfather        dx 59, dx 20   Alcohol abuse Paternal Grandfather    Arthritis Paternal Grandfather    Cirrhosis Paternal Grandfather    Depression Daughter    Hypertension Daughter    Mental illness Daughter    Colon polyps Daughter    Depression Son      GENETIC COUNSELING/TESTING: See above  SOCIAL HISTORY:  Social History   Socioeconomic History   Marital status: Widowed    Spouse name: Not on file   Number of children: 3   Years of education: Not on file   Highest education level: Not on file  Occupational History   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    Drug use: Never   Sexual activity: Not on file  Other Topics Concern   Not on file  Social History Narrative   Not on file   Social Drivers of Health   Financial Resource Strain: Low Risk  (10/04/2021)   Overall Financial Resource Strain (CARDIA)    Difficulty of Paying Living Expenses: Not hard at all  Food Insecurity: Low Risk  (01/08/2024)   Received from [facility]   Hunger Vital Sign     Within the past 12 months, you worried that your food would run out before you got money to buy more: Never true    Within the past 12 months, the food you bought just didn't last and you didn't have money to get more. : Never true  Transportation Needs: No Transportation Needs (01/08/2024)   Received from [facility]    In the past 12 months, has lack of reliable transportation kept you from medical appointments, meetings, work or from getting things needed for daily living? : No  Physical Activity: Inactive (08/17/2021)   Exercise Vital Sign    Days of Exercise per Week: 0 days    Minutes of Exercise per Session: 0 min  Stress: No Stress Concern Present (08/17/2021)   [employer] of Occupational [employer] - Occupational Stress Questionnaire    Feeling of Stress : Not at all  Social Connections: Unknown (12/17/2021)   Received from [facility]   Social Network    Social Network: Not on file  Intimate Partner Violence: Unknown (12/17/2021)   Received from [facility]   HITS    Physically Hurt: Not on file    Insult or Talk Down To: Not on file    Threaten Physical Harm: Not on file    Scream or Curse: Not on file     OBSERVATIONS/OBJECTIVE:  There were  no vitals taken for this visit.  Physical Exam Constitutional:      Appearance: Normal appearance.  Chest:     Comments: Bilateral breast inspected and palpated.  Postsurgical changes noted.  No palpable masses or regional adenopathy. Musculoskeletal:        General: No swelling.     Cervical back: Normal range of motion and neck supple. No rigidity.  Lymphadenopathy:     Cervical: No cervical adenopathy.  Neurological:     Mental Status: She is alert.      LABORATORY DATA:  None for this visit.  DIAGNOSTIC IMAGING:  None for this visit.     ASSESSMENT AND PLAN:   Ms.. Ansley is a pleasant 83 y.o. female with Stage IA right breast invasive ductal carcinoma, ER+/PR+/HER2-, diagnosed in 01/2021,  treated with lumpectomy, and anti-estrogen therapy with Tamoxifen  beginning in 04/2021.    Assessment and Plan Assessment & Plan Breast cancer on tamoxifen  therapy Breast cancer stable on tamoxifen  with no new side effects. Mammogram showed no changes. - Continue tamoxifen  therapy. - Reviewed mammogram results from [facility] - Continue annual follow up and annual mammogram,  Obesity Weight reduced from 270 lbs to 237 lbs with Zepbound  aiding appetite control. Weight loss plateaued but nearing target for knee surgery. - Continue Zepbound  for weight management. - Encouraged continued weight loss efforts to reach target weight for knee surgery.  Time spent: 30 min  Amber Stalls MD

## 2024-06-29 ENCOUNTER — Ambulatory Visit (INDEPENDENT_AMBULATORY_CARE_PROVIDER_SITE_OTHER): Payer: Self-pay | Admitting: Psychology

## 2024-06-29 DIAGNOSIS — F5089 Other specified eating disorder: Secondary | ICD-10-CM | POA: Diagnosis not present

## 2024-06-29 NOTE — Progress Notes (Signed)
  Office: [phone]  /  Fax: [phone]    Date: June 29, 2024  Appointment Start Time: 10:05am Duration: 28 minutes Provider: Wyatt Fire, Psy.D. Type of Session: Individual Therapy  Location of Patient/Provider: [facility] clinic at [facility] Type of Contact: In-person  Session Content: Kristi Hunter is a 83 y.o. female presenting for a follow-up appointment to address the previously established treatment goal of increasing coping skills.This provider conducted a brief check-in. Kristi Hunter shared about recent events, including her trip to [place]. She also shared about her eating habits, physical limitations, and decreased physical activity due to her injury. She noted she continues to take Zepbound , noting I lost a couple pounds. Associated thoughts and feelings were explored and processed. Reflected on progress to date and her plan for the future. Additionally, this provider shared she will be leaving the practice mid-December. Referral options were discussed, but Kristi Hunter declined at this time, noting she will talk to Dr. Verdon in the future about a referral if needed. Overall,  Kristi Hunter was receptive to today's appointment as evidenced by openness to sharing, responsiveness to feedback, and willingness to engage in learned skills.  Mental Status Examination:  Appearance: neat Behavior: appropriate to circumstances Mood: neutral Affect: mood congruent Speech: WNL Eye Contact: appropriate Psychomotor Activity: WNL Gait: ambulates with cane Thought Process: linear, logical, and goal directed and no evidence or endorsement of suicidal, homicidal, and self-harm ideation, plan and intent  Thought Content/Perception: no hallucinations, delusions, bizarre thinking or behavior endorsed or observed Orientation: AAOx4 Memory/Concentration: intact Insight: good Judgment: good  Interventions:  Conducted a brief chart review Provided empathic reflections and validation Provided positive  reinforcement Employed supportive psychotherapy interventions to facilitate reduced distress and to improve coping skills with identified stressors  DSM-5 Diagnosis(es): F50.89 Other Specified Feeding or Eating Disorder, Emotional Eating Behaviors  Treatment Goal & Progress: Kristi Hunter re-established care with this provider and following treatment goal was established: increase coping skills. Kristi Hunter demonstrated progress in her goal as evidenced by increased awareness of hunger patterns and reduction in engagement in emotional eating behaviors. Kristi Hunter also continues to demonstrate willingness to engage in learned skill(s) (e.g., implementing vacation strategies; utilizing non-dominant hand to eat to slow down eating).   Plan: As previously planned, today was Kristi Hunter's last appointment with this provider. No further follow-up planned by this provider.    Wyatt Fire, PsyD

## 2024-07-21 ENCOUNTER — Ambulatory Visit (INDEPENDENT_AMBULATORY_CARE_PROVIDER_SITE_OTHER): Payer: Self-pay | Admitting: Family Medicine

## 2024-07-21 ENCOUNTER — Encounter (INDEPENDENT_AMBULATORY_CARE_PROVIDER_SITE_OTHER): Payer: Self-pay | Admitting: Family Medicine

## 2024-07-21 VITALS — BP 124/79 | HR 92 | Temp 97.5°F | Ht 63.0 in | Wt 235.0 lb

## 2024-07-21 DIAGNOSIS — Z6841 Body Mass Index (BMI) 40.0 and over, adult: Secondary | ICD-10-CM | POA: Diagnosis not present

## 2024-07-21 DIAGNOSIS — G4733 Obstructive sleep apnea (adult) (pediatric): Secondary | ICD-10-CM | POA: Diagnosis not present

## 2024-07-21 DIAGNOSIS — R7303 Prediabetes: Secondary | ICD-10-CM | POA: Diagnosis not present

## 2024-07-21 MED ORDER — METFORMIN HCL 500 MG PO TABS: 500.0000 mg | ORAL_TABLET | Freq: Two times a day (BID) | ORAL | 0 refills | Status: AC

## 2024-07-21 MED ORDER — TIRZEPATIDE-WEIGHT MANAGEMENT 5 MG/0.5ML ~~LOC~~ SOLN: 5.0000 mg | SUBCUTANEOUS | 0 refills | Status: DC

## 2024-07-21 NOTE — Progress Notes (Signed)
 Office: [phone]  /  Fax: [phone]  WEIGHT SUMMARY AND BIOMETRICS  Anthropometric Measurements Height: 5' 3 (1.6 m) Weight: 235 lb (106.6 kg) BMI (Calculated): 41.64 Weight at Last Visit: 241 lb Weight Lost Since Last Visit: 6 lb Weight Gained Since Last Visit: 0 Starting Weight: 267 lb Total Weight Loss (lbs): 32 lb (14.5 kg) Peak Weight: 280 lb   Body Composition  Body Fat %: 55.9 % Fat Mass (lbs): 131.8 lbs Muscle Mass (lbs): 98.6 lbs Visceral Fat Rating : 21   Other Clinical Data RMR: [id number] Fasting: no Labs: no Today's Visit #: 46 Starting Date: 02/22/20    Chief Complaint: OBESITY    History of Present Illness Kristi Hunter is an 83 year old female with obesity and obstructive sleep apnea who presents for obesity treatment and progress assessment.  She has been following a portion control plan but is unsure of the specific plan. She attempts to eat less, skips meals, does not hydrate adequately, and does not consume the recommended amount of protein. She is trying to increase her intake of fruits and vegetables. Despite not exercising due to a leg injury that required stitches and wrapping for almost four months, she has lost six pounds in the last two months. Her leg has healed after being wrapped for almost four months due to an injury that required stitches.  She has a history of emotional eating behaviors and is under care for this issue. Her hunger is well controlled, but she struggles with a sweet tooth, especially during the holidays. 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  She has obstructive sleep apnea and is being treated with CPAP and Zepbound , currently taking 5 mg weekly. She experiences constipation as a side effect, which she manages with Metamucil as needed. She has  prediabetes and is on metformin  500 mg twice daily. She requests refills for both Zepbound  and metformin .  She mentions a history of neuropathy in her feet, which is being monitored with plans for a blood workup. She has been seeing a wound doctor for a leg wound that has now healed. She also reports occasional coccyx pain from a past fall, which is managed with topical patches. She is due for a complete blood workup and plans to schedule this soon.      PHYSICAL EXAM:  Blood pressure 124/79, pulse 92, temperature (!) 97.5 F (36.4 C), height 5' 3 (1.6 m), weight 235 lb (106.6 kg), SpO2 97%. Body mass index is 41.63 kg/m.  DIAGNOSTIC DATA REVIEWED:  BMET    Component Value Date/Time   NA 144 06/17/2023 1105   K 3.5 06/17/2023 1105   CL 100 06/17/2023 1105   CO2 31 (H) 06/17/2023 1105   GLUCOSE 103 (H) 06/17/2023 1105   GLUCOSE 102 (H) 03/18/2023 0845   BUN 18 06/17/2023 1105   CREATININE 0.60 06/17/2023 1105   CREATININE 0.76 12/11/2021 1226   CALCIUM  9.2 06/17/2023 1105   GFRNONAA >60 03/18/2023 0845   GFRNONAA >60 12/11/2021 1226   GFRAA 104 02/22/2020 1339   Lab Results  Component Value Date   HGBA1C 6.1 (H) 06/17/2023   HGBA1C 6.0 01/13/2019   Lab Results  Component Value Date   INSULIN  22.5 02/20/2022   INSULIN  21.5 02/22/2020   Lab Results  Component Value Date   TSH 3.10 08/23/2022   CBC  Component Value Date/Time   WBC 7.5 03/18/2023 0845   RBC 4.29 03/18/2023 0845   HGB 12.1 03/18/2023 0845   HGB 12.6 02/20/2022 0942   HCT 38.1 03/18/2023 0845   HCT 39.6 02/20/2022 0942   PLT 279 03/18/2023 0845   PLT 318 02/20/2022 0942   MCV 88.8 03/18/2023 0845   MCV 87 02/20/2022 0942   MCH 28.2 03/18/2023 0845   MCHC 31.8 03/18/2023 0845   RDW 13.4 03/18/2023 0845   RDW 13.0 02/20/2022 0942   Iron Studies No results found for: IRON, TIBC, FERRITIN, IRONPCTSAT Lipid Panel     Component Value Date/Time   CHOL 161 08/23/2022 0000   CHOL 159  02/20/2022 0942   TRIG 58 08/23/2022 0000   HDL 64 08/23/2022 0000   HDL 49 02/20/2022 0942   CHOLHDL 3 01/13/2019 0913   VLDL 14.4 01/13/2019 0913   LDLCALC 84 08/23/2022 0000   LDLCALC 98 02/20/2022 0942   Hepatic Function Panel     Component Value Date/Time   PROT 6.4 06/17/2023 1105   ALBUMIN 3.6 (L) 06/17/2023 1105   AST 18 06/17/2023 1105   AST 19 12/11/2021 1226   ALT 16 06/17/2023 1105   ALT 17 12/11/2021 1226   ALKPHOS 59 06/17/2023 1105   BILITOT <0.2 06/17/2023 1105   BILITOT 0.3 12/11/2021 1226   BILIDIR 0.1 03/15/2022 1149      Component Value Date/Time   TSH 3.10 08/23/2022 0000   TSH 1.470 02/22/2020 1339   Nutritional Lab Results  Component Value Date   VD25OH 30.9 06/17/2023   VD25OH 32.7 02/20/2022   VD25OH 35.1 12/25/2021     Assessment and Plan Assessment & Plan Morbid obesity Managed with Zepbound  5 mg weekly. She has lost 6 pounds in the last two months. Reports constipation managed with Metamucil. Hunger is well controlled, but sweet tooth remains a challenge. Concerns about inadequate protein intake due to portion control and meal skipping. - Continue Zepbound  5 mg weekly. - Continue Metamucil as needed for constipation. - Provided a list of high-protein foods to increase protein intake. - Scheduled follow-up appointment for end of February or early March. - Continue [place] eating plan  Prediabetes Managed with metformin  500 mg twice daily. She requests a refill. - Continue metformin  500 mg twice daily. - Refilled metformin  prescription. - Continue diet, exercise and weight loss as discussed today as an important part of the treatment plan   Obstructive sleep apnea Managed with CPAP therapy and Zepbound  - Continue CPAP therapy. - Continue diet, exercise and weight loss as discussed today as an important part of the treatment plan       Patients who are on anti-obesity medications are counseled on the importance of maintaining  healthy lifestyle habits, including balanced nutrition, regular physical activity, and behavioral modifications,  Medication is an adjunct to, not a replacement for, lifestyle changes and that the long-term success and weight maintenance depend on continued adherence to these strategies.   Kristi Hunter was informed of the importance of frequent follow up visits to maximize her success with intensive lifestyle modifications for her obesity and obesity related health conditions as recommended by USPSTF and CMS guidelines   Louann Penton, MD

## 2024-07-22 ENCOUNTER — Telehealth (INDEPENDENT_AMBULATORY_CARE_PROVIDER_SITE_OTHER): Payer: Self-pay | Admitting: Family Medicine

## 2024-07-22 NOTE — Telephone Encounter (Signed)
 Pt called and is asking for lab orders to be faxed to [phone]. She is having labs drawn there and would like it faxed today pleas.

## 2024-07-27 NOTE — Telephone Encounter (Signed)
 Left voicemail for patient to call back.

## 2024-07-28 ENCOUNTER — Telehealth (INDEPENDENT_AMBULATORY_CARE_PROVIDER_SITE_OTHER): Payer: Self-pay | Admitting: Family Medicine

## 2024-07-28 NOTE — Telephone Encounter (Addendum)
 Spoke to patient this morning regarding a lab order. Since Dr. Verdon is out of the office we are unable to put in a lab order. There is nothing in her chart about labs for this patient. Patient will have to wait until Dr. Verdon is back in office for us  to get lab order. Also told patient to speak to her primary care Dr. If she wants her labs done before the end of the year.

## 2024-08-13 ENCOUNTER — Other Ambulatory Visit: Payer: Self-pay

## 2024-08-13 MED ORDER — TAMOXIFEN CITRATE 20 MG PO TABS: 20.0000 mg | ORAL_TABLET | Freq: Every day | ORAL | 3 refills | Status: DC

## 2024-09-03 ENCOUNTER — Emergency Department (HOSPITAL_COMMUNITY)

## 2024-09-03 ENCOUNTER — Inpatient Hospital Stay (HOSPITAL_COMMUNITY)
Admission: EM | Admit: 2024-09-03 | Discharge: 2024-09-07 | DRG: 023 | Disposition: A | Discharge status: Skilled Nursing Facility | Source: Skilled Nursing Facility | Attending: Neurology | Admitting: Neurology

## 2024-09-03 ENCOUNTER — Emergency Department (HOSPITAL_COMMUNITY): Admitting: Certified Registered Nurse Anesthetist

## 2024-09-03 ENCOUNTER — Inpatient Hospital Stay (HOSPITAL_COMMUNITY)

## 2024-09-03 ENCOUNTER — Encounter (HOSPITAL_COMMUNITY)
Admission: EM | Disposition: A | Discharge status: Skilled Nursing Facility | Payer: Self-pay | Source: Skilled Nursing Facility | Attending: Neurology

## 2024-09-03 ENCOUNTER — Encounter (HOSPITAL_COMMUNITY): Payer: Self-pay | Admitting: Certified Registered Nurse Anesthetist

## 2024-09-03 DIAGNOSIS — E785 Hyperlipidemia, unspecified: Secondary | ICD-10-CM | POA: Diagnosis not present

## 2024-09-03 DIAGNOSIS — Z825 Family history of asthma and other chronic lower respiratory diseases: Secondary | ICD-10-CM

## 2024-09-03 DIAGNOSIS — R29810 Facial weakness: Secondary | ICD-10-CM | POA: Diagnosis present

## 2024-09-03 DIAGNOSIS — J9589 Other postprocedural complications and disorders of respiratory system, not elsewhere classified: Secondary | ICD-10-CM

## 2024-09-03 DIAGNOSIS — R1310 Dysphagia, unspecified: Secondary | ICD-10-CM | POA: Diagnosis present

## 2024-09-03 DIAGNOSIS — I69391 Dysphagia following cerebral infarction: Secondary | ICD-10-CM | POA: Diagnosis not present

## 2024-09-03 DIAGNOSIS — Z803 Family history of malignant neoplasm of breast: Secondary | ICD-10-CM

## 2024-09-03 DIAGNOSIS — J439 Emphysema, unspecified: Secondary | ICD-10-CM | POA: Diagnosis present

## 2024-09-03 DIAGNOSIS — J95821 Acute postprocedural respiratory failure: Secondary | ICD-10-CM | POA: Diagnosis not present

## 2024-09-03 DIAGNOSIS — Z7983 Long term (current) use of bisphosphonates: Secondary | ICD-10-CM

## 2024-09-03 DIAGNOSIS — G4733 Obstructive sleep apnea (adult) (pediatric): Secondary | ICD-10-CM | POA: Diagnosis present

## 2024-09-03 DIAGNOSIS — Z7984 Long term (current) use of oral hypoglycemic drugs: Secondary | ICD-10-CM | POA: Diagnosis not present

## 2024-09-03 DIAGNOSIS — W07XXXA Fall from chair, initial encounter: Secondary | ICD-10-CM | POA: Diagnosis present

## 2024-09-03 DIAGNOSIS — F32A Depression, unspecified: Secondary | ICD-10-CM | POA: Diagnosis present

## 2024-09-03 DIAGNOSIS — Z833 Family history of diabetes mellitus: Secondary | ICD-10-CM | POA: Diagnosis not present

## 2024-09-03 DIAGNOSIS — H409 Unspecified glaucoma: Secondary | ICD-10-CM | POA: Diagnosis present

## 2024-09-03 DIAGNOSIS — E669 Obesity, unspecified: Secondary | ICD-10-CM | POA: Diagnosis not present

## 2024-09-03 DIAGNOSIS — I129 Hypertensive chronic kidney disease with stage 1 through stage 4 chronic kidney disease, or unspecified chronic kidney disease: Secondary | ICD-10-CM | POA: Diagnosis not present

## 2024-09-03 DIAGNOSIS — Z87891 Personal history of nicotine dependence: Secondary | ICD-10-CM

## 2024-09-03 DIAGNOSIS — I890 Lymphedema, not elsewhere classified: Secondary | ICD-10-CM | POA: Diagnosis not present

## 2024-09-03 DIAGNOSIS — Z853 Personal history of malignant neoplasm of breast: Secondary | ICD-10-CM

## 2024-09-03 DIAGNOSIS — E782 Mixed hyperlipidemia: Secondary | ICD-10-CM | POA: Diagnosis present

## 2024-09-03 DIAGNOSIS — R29716 NIHSS score 16: Secondary | ICD-10-CM

## 2024-09-03 DIAGNOSIS — M549 Dorsalgia, unspecified: Secondary | ICD-10-CM | POA: Diagnosis present

## 2024-09-03 DIAGNOSIS — Z800 Family history of malignant neoplasm of digestive organs: Secondary | ICD-10-CM

## 2024-09-03 DIAGNOSIS — G8191 Hemiplegia, unspecified affecting right dominant side: Secondary | ICD-10-CM | POA: Diagnosis present

## 2024-09-03 DIAGNOSIS — I63412 Cerebral infarction due to embolism of left middle cerebral artery: Secondary | ICD-10-CM | POA: Diagnosis not present

## 2024-09-03 DIAGNOSIS — I10 Essential (primary) hypertension: Secondary | ICD-10-CM | POA: Diagnosis present

## 2024-09-03 DIAGNOSIS — I639 Cerebral infarction, unspecified: Secondary | ICD-10-CM | POA: Diagnosis present

## 2024-09-03 DIAGNOSIS — Z885 Allergy status to narcotic agent status: Secondary | ICD-10-CM

## 2024-09-03 DIAGNOSIS — J449 Chronic obstructive pulmonary disease, unspecified: Secondary | ICD-10-CM | POA: Diagnosis not present

## 2024-09-03 DIAGNOSIS — E538 Deficiency of other specified B group vitamins: Secondary | ICD-10-CM | POA: Diagnosis present

## 2024-09-03 DIAGNOSIS — E1122 Type 2 diabetes mellitus with diabetic chronic kidney disease: Secondary | ICD-10-CM | POA: Diagnosis not present

## 2024-09-03 DIAGNOSIS — R4701 Aphasia: Secondary | ICD-10-CM | POA: Diagnosis present

## 2024-09-03 DIAGNOSIS — Z79899 Other long term (current) drug therapy: Secondary | ICD-10-CM | POA: Diagnosis not present

## 2024-09-03 DIAGNOSIS — Z91199 Patient's noncompliance with other medical treatment and regimen due to unspecified reason: Secondary | ICD-10-CM

## 2024-09-03 DIAGNOSIS — Z96651 Presence of right artificial knee joint: Secondary | ICD-10-CM | POA: Diagnosis present

## 2024-09-03 DIAGNOSIS — I63512 Cerebral infarction due to unspecified occlusion or stenosis of left middle cerebral artery: Secondary | ICD-10-CM | POA: Diagnosis present

## 2024-09-03 DIAGNOSIS — E876 Hypokalemia: Secondary | ICD-10-CM | POA: Diagnosis present

## 2024-09-03 DIAGNOSIS — Z7982 Long term (current) use of aspirin: Secondary | ICD-10-CM | POA: Diagnosis not present

## 2024-09-03 DIAGNOSIS — Z6841 Body Mass Index (BMI) 40.0 and over, adult: Secondary | ICD-10-CM | POA: Diagnosis not present

## 2024-09-03 DIAGNOSIS — Z886 Allergy status to analgesic agent status: Secondary | ICD-10-CM

## 2024-09-03 DIAGNOSIS — Z8349 Family history of other endocrine, nutritional and metabolic diseases: Secondary | ICD-10-CM

## 2024-09-03 DIAGNOSIS — Y92098 Other place in other non-institutional residence as the place of occurrence of the external cause: Secondary | ICD-10-CM | POA: Diagnosis not present

## 2024-09-03 DIAGNOSIS — Z808 Family history of malignant neoplasm of other organs or systems: Secondary | ICD-10-CM

## 2024-09-03 DIAGNOSIS — N189 Chronic kidney disease, unspecified: Secondary | ICD-10-CM | POA: Diagnosis not present

## 2024-09-03 DIAGNOSIS — Z823 Family history of stroke: Secondary | ICD-10-CM

## 2024-09-03 DIAGNOSIS — Z8542 Personal history of malignant neoplasm of other parts of uterus: Secondary | ICD-10-CM

## 2024-09-03 DIAGNOSIS — Z90710 Acquired absence of both cervix and uterus: Secondary | ICD-10-CM

## 2024-09-03 DIAGNOSIS — S62306A Unspecified fracture of fifth metacarpal bone, right hand, initial encounter for closed fracture: Secondary | ICD-10-CM | POA: Diagnosis present

## 2024-09-03 DIAGNOSIS — Z83438 Family history of other disorder of lipoprotein metabolism and other lipidemia: Secondary | ICD-10-CM

## 2024-09-03 DIAGNOSIS — Z811 Family history of alcohol abuse and dependence: Secondary | ICD-10-CM

## 2024-09-03 DIAGNOSIS — Z8049 Family history of malignant neoplasm of other genital organs: Secondary | ICD-10-CM

## 2024-09-03 DIAGNOSIS — Z818 Family history of other mental and behavioral disorders: Secondary | ICD-10-CM

## 2024-09-03 DIAGNOSIS — J9600 Acute respiratory failure, unspecified whether with hypoxia or hypercapnia: Secondary | ICD-10-CM | POA: Diagnosis present

## 2024-09-03 DIAGNOSIS — R7303 Prediabetes: Secondary | ICD-10-CM | POA: Diagnosis not present

## 2024-09-03 DIAGNOSIS — Z7985 Long-term (current) use of injectable non-insulin antidiabetic drugs: Secondary | ICD-10-CM

## 2024-09-03 DIAGNOSIS — E119 Type 2 diabetes mellitus without complications: Secondary | ICD-10-CM | POA: Diagnosis present

## 2024-09-03 DIAGNOSIS — Z8261 Family history of arthritis: Secondary | ICD-10-CM

## 2024-09-03 DIAGNOSIS — F419 Anxiety disorder, unspecified: Secondary | ICD-10-CM | POA: Diagnosis not present

## 2024-09-03 DIAGNOSIS — Z8249 Family history of ischemic heart disease and other diseases of the circulatory system: Secondary | ICD-10-CM | POA: Diagnosis not present

## 2024-09-03 DIAGNOSIS — Z7902 Long term (current) use of antithrombotics/antiplatelets: Secondary | ICD-10-CM | POA: Diagnosis not present

## 2024-09-03 DIAGNOSIS — Z813 Family history of other psychoactive substance abuse and dependence: Secondary | ICD-10-CM

## 2024-09-03 DIAGNOSIS — E1151 Type 2 diabetes mellitus with diabetic peripheral angiopathy without gangrene: Secondary | ICD-10-CM | POA: Diagnosis not present

## 2024-09-03 DIAGNOSIS — I6389 Other cerebral infarction: Secondary | ICD-10-CM | POA: Diagnosis not present

## 2024-09-03 DIAGNOSIS — Z79810 Long term (current) use of selective estrogen receptor modulators (SERMs): Secondary | ICD-10-CM

## 2024-09-03 DIAGNOSIS — D72829 Elevated white blood cell count, unspecified: Secondary | ICD-10-CM | POA: Diagnosis present

## 2024-09-03 LAB — COMPREHENSIVE METABOLIC PANEL WITH GFR
ALT: 25 U/L (ref 0–44)
AST: 39 U/L (ref 15–41)
Albumin: 3.3 g/dL — ABNORMAL LOW (ref 3.5–5.0)
Alkaline Phosphatase: 71 U/L (ref 38–126)
Anion gap: 17 — ABNORMAL HIGH (ref 5–15)
BUN: 27 mg/dL — ABNORMAL HIGH (ref 8–23)
CO2: 23 mmol/L (ref 22–32)
Calcium: 9.1 mg/dL (ref 8.9–10.3)
Chloride: 95 mmol/L — ABNORMAL LOW (ref 98–111)
Creatinine, Ser: 0.9 mg/dL (ref 0.44–1.00)
GFR, Estimated: >60 mL/min
Glucose, Bld: 118 mg/dL — ABNORMAL HIGH (ref 70–99)
Potassium: 3.7 mmol/L (ref 3.5–5.1)
Sodium: 135 mmol/L (ref 135–145)
Total Bilirubin: 0.4 mg/dL (ref 0.0–1.2)
Total Protein: 6.6 g/dL (ref 6.5–8.1)

## 2024-09-03 LAB — APTT: aPTT: 26 s (ref 24–36)

## 2024-09-03 LAB — PROTIME-INR
INR: 1.1 (ref 0.8–1.2)
Prothrombin Time: 14.4 s (ref 11.4–15.2)

## 2024-09-03 LAB — ETHANOL: Alcohol, Ethyl (B): <15 mg/dL

## 2024-09-03 LAB — CBG MONITORING, ED: Glucose-Capillary: 132 mg/dL — ABNORMAL HIGH (ref 70–99)

## 2024-09-03 MED ORDER — ONDANSETRON HCL 4 MG/2ML IJ SOLN
4.0000 mg | Freq: Once | INTRAMUSCULAR | Status: AC
  Administered 2024-09-03: 4 mg via INTRAVENOUS

## 2024-09-03 MED ORDER — ONDANSETRON HCL 4 MG/2ML IJ SOLN
INTRAMUSCULAR | Status: AC
  Filled 2024-09-03: qty 2

## 2024-09-03 MED ORDER — ORAL CARE MOUTH RINSE: 15.0000 mL | OROMUCOSAL | Status: DC | PRN

## 2024-09-03 MED ORDER — NICARDIPINE HCL IN NACL 20-0.86 MG/200ML-% IV SOLN: 0.0000 mg/h | INTRAVENOUS | Status: DC | PRN

## 2024-09-03 MED ORDER — LIDOCAINE HCL (CARDIAC) PF 100 MG/5ML IV SOSY
PREFILLED_SYRINGE | INTRAVENOUS | Status: DC | PRN
  Administered 2024-09-03: 100 mg via INTRAVENOUS

## 2024-09-03 MED ORDER — PHENYLEPHRINE HCL-NACL 20-0.9 MG/250ML-% IV SOLN
0.0000 ug/min | INTRAVENOUS | Status: DC
  Administered 2024-09-03: 15 ug/min via INTRAVENOUS

## 2024-09-03 MED ORDER — SUCCINYLCHOLINE CHLORIDE 200 MG/10ML IV SOSY
PREFILLED_SYRINGE | INTRAVENOUS | Status: DC | PRN
  Administered 2024-09-03: 140 mg via INTRAVENOUS

## 2024-09-03 MED ORDER — LACTATED RINGERS IV SOLN: INTRAVENOUS | Status: DC | PRN

## 2024-09-03 MED ORDER — PHENYLEPHRINE HCL-NACL 20-0.9 MG/250ML-% IV SOLN
INTRAVENOUS | Status: DC | PRN
  Administered 2024-09-03: 50 ug/min via INTRAVENOUS

## 2024-09-03 MED ORDER — TENECTEPLASE 25 MG IV KIT
25.0000 mg | PACK | Freq: Once | INTRAVENOUS | Status: AC
  Administered 2024-09-03: 25 mg via INTRAVENOUS
  Filled 2024-09-03: qty 5

## 2024-09-03 MED ORDER — ORAL CARE MOUTH RINSE
15.0000 mL | OROMUCOSAL | Status: DC
  Administered 2024-09-03 – 2024-09-04 (×10): 15 mL via OROMUCOSAL

## 2024-09-03 MED ORDER — STROKE: EARLY STAGES OF RECOVERY BOOK
Freq: Once | Status: AC
  Filled 2024-09-03: qty 1

## 2024-09-03 MED ORDER — CLEVIDIPINE BUTYRATE 0.5 MG/ML IV EMUL: 0.0000 mg/h | INTRAVENOUS | Status: DC

## 2024-09-03 MED ORDER — IOHEXOL 350 MG/ML SOLN
75.0000 mL | Freq: Once | INTRAVENOUS | Status: AC | PRN
  Administered 2024-09-03: 75 mL via INTRAVENOUS

## 2024-09-03 MED ORDER — TAMOXIFEN CITRATE 10 MG PO TABS
20.0000 mg | ORAL_TABLET | Freq: Every day | ORAL | Status: DC
  Filled 2024-09-03: qty 2

## 2024-09-03 MED ORDER — IOHEXOL 300 MG/ML  SOLN
150.0000 mL | Freq: Once | INTRAMUSCULAR | Status: AC | PRN
  Administered 2024-09-03: 35 mL via INTRA_ARTERIAL

## 2024-09-03 MED ORDER — INSULIN ASPART 100 UNIT/ML IJ SOLN
0.0000 [IU] | INTRAMUSCULAR | Status: DC
  Administered 2024-09-04 (×3): 2 [IU] via SUBCUTANEOUS
  Administered 2024-09-04: 3 [IU] via SUBCUTANEOUS
  Administered 2024-09-04 – 2024-09-05 (×3): 2 [IU] via SUBCUTANEOUS
  Filled 2024-09-03: qty 2
  Filled 2024-09-03: qty 3
  Filled 2024-09-03 (×4): qty 2

## 2024-09-03 MED ORDER — PROPOFOL 500 MG/50ML IV EMUL
INTRAVENOUS | Status: DC | PRN
  Administered 2024-09-03: 20 ug/kg/min via INTRAVENOUS

## 2024-09-03 MED ORDER — PANTOPRAZOLE SODIUM 40 MG IV SOLR
40.0000 mg | Freq: Every day | INTRAVENOUS | Status: DC
  Administered 2024-09-03 – 2024-09-05 (×3): 40 mg via INTRAVENOUS
  Filled 2024-09-03 (×3): qty 10

## 2024-09-03 MED ORDER — PHENYLEPHRINE HCL (PRESSORS) 10 MG/ML IV SOLN
INTRAVENOUS | Status: DC | PRN
  Administered 2024-09-03: 80 ug via INTRAVENOUS

## 2024-09-03 MED ORDER — CHLORHEXIDINE GLUCONATE CLOTH 2 % EX PADS
6.0000 | MEDICATED_PAD | Freq: Every day | CUTANEOUS | Status: DC
  Administered 2024-09-04 – 2024-09-07 (×4): 6 via TOPICAL

## 2024-09-03 MED ORDER — LABETALOL HCL 5 MG/ML IV SOLN: 10.0000 mg | Freq: Once | INTRAVENOUS | Status: DC | PRN

## 2024-09-03 MED ORDER — ROCURONIUM BROMIDE 100 MG/10ML IV SOLN
INTRAVENOUS | Status: DC | PRN
  Administered 2024-09-03: 30 mg via INTRAVENOUS

## 2024-09-03 MED ORDER — ALBUMIN HUMAN 5 % IV SOLN: INTRAVENOUS | Status: DC | PRN

## 2024-09-03 MED ORDER — SODIUM CHLORIDE 0.9 % IV SOLN: INTRAVENOUS | Status: DC

## 2024-09-03 MED ORDER — PROPOFOL 10 MG/ML IV BOLUS
INTRAVENOUS | Status: DC | PRN
  Administered 2024-09-03: 200 mg via INTRAVENOUS

## 2024-09-03 MED ORDER — PROPOFOL 1000 MG/100ML IV EMUL
0.0000 ug/kg/min | INTRAVENOUS | Status: DC
  Administered 2024-09-03: 65 ug/kg/min via INTRAVENOUS
  Administered 2024-09-04: 55 ug/kg/min via INTRAVENOUS
  Administered 2024-09-04: 40 ug/kg/min via INTRAVENOUS
  Administered 2024-09-04: 65 ug/kg/min via INTRAVENOUS
  Filled 2024-09-03 (×5): qty 100

## 2024-09-03 MED ORDER — SODIUM CHLORIDE 0.9 % IV BOLUS: 250.0000 mL | INTRAVENOUS | Status: AC | PRN

## 2024-09-03 NOTE — Transfer of Care (Signed)
 Immediate Anesthesia Transfer of Care Note  Patient: Kristi Hunter  Procedure(s) Performed: RADIOLOGY WITH ANESTHESIA (Left)  Patient Location: PACU  Anesthesia Type:General  Level of Consciousness: Patient remains intubated per anesthesia plan  Airway & Oxygen  Therapy: Patient remains intubated per anesthesia plan and Patient placed on Ventilator (see vital sign flow sheet for setting)  Post-op Assessment: Report given to RN and Post -op Vital signs reviewed and stable  Post vital signs: Reviewed and stable  Last Vitals:  Vitals Value Taken Time  BP    Temp    Pulse 84 09/03/24 21:48  Resp 17 09/03/24 21:48  SpO2 100 % 09/03/24 21:48  Vitals shown include unfiled device data.  Last Pain: There were no vitals filed for this visit.       Complications: No notable events documented.

## 2024-09-03 NOTE — Progress Notes (Signed)
 PHARMACIST CODE STROKE RESPONSE  Notified to mix TNK at 1931 by Dr. Michaela TNK preparation completed at 1933  TNK dose = 25 mg IV over 5 seconds  Issues/delays encountered (if applicable): None  Kristi Hunter 09/03/24 7:35 PM

## 2024-09-03 NOTE — Consult Note (Signed)
 NeuroInterventional Radiology  Pre-Procedure Note  Dr. Michaela from Vascular Neurology and I have discussed this case.  We have evaluated the patient's symptoms, imaging findings, and what we know of the patient's condition before the onset of the current stroke event.  We agree the patient is an appropriate candidate for mechanical thrombectomy and that, despite the risks of thrombectomy, emergent thrombectomy surgery offers the best possibility of neurological improvement.

## 2024-09-03 NOTE — Code Documentation (Addendum)
 Responded to Code Stroke called at 1847 for L sided gaze, R sided weakness, and dysarthria, LSN-1800. Pt arrived at 1912, CBG-132, NIH-16, CT head-negative for acute changes. TNK given at 1934. CTA-L MCA M1 occlusion. IR activated at 1946 and pt txed to IR suite, arriving at 1955. Plan ICU admission post-procedure.

## 2024-09-03 NOTE — ED Provider Notes (Signed)
 " [place] [facility] Provider Note   CSN: [id number] Arrival date & time: 09/03/24  8087  An emergency department physician performed an initial assessment on this suspected stroke patient at 56.  Patient presents with: No chief complaint on file.   Kristi Hunter is a 84 y.o. female.   Pt is a 84 yo female with pmhx significant for arthritis, endometrial cancer, COPD, HTN, CKD, glaucoma, anxiety, breast cancer, and sleep apnea.  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  Code stroke called by EMS.  Pt was met at the bridge by the stroke team and myself.  She was brought directly to the CT scanner.  Pt is not on blood thinners.       Prior to Admission medications  Medication Sig Start Date End Date Taking? Authorizing Provider  acetaminophen  (TYLENOL ) 650 MG CR tablet Take 650 mg by mouth every 8 (eight) hours as needed for pain. Takes usually once a day    [provider]  alendronate  (FOSAMAX ) 70 MG tablet TAKE 1 TABLET (70 MG TOTAL) BY MOUTH EVERY 7 (SEVEN) DAYS. TAKE WITH A FULL GLASS OF WATER ON AN EMPTY STOMACH. 04/04/22   Berneta Elsie Sayre, MD  chlorthalidone  (HYGROTON ) 25 MG tablet Take 1 tablet (25 mg total) by mouth daily. 03/15/22   Berneta Elsie Sayre, MD  diclofenac Sodium (VOLTAREN) 1 % GEL Apply 2 g topically daily as needed (Osteoarthritis).    [provider]  fluticasone  (FLONASE ) 50 MCG/ACT nasal spray Place 2 sprays into both nostrils in the morning and at bedtime. 04/17/23   Verdon Parry D, MD  furosemide  (LASIX ) 20 MG tablet Take 1 tablet (20 mg total) by mouth daily. 05/06/24   Emelia Josefa HERO, NP  Melatonin 10 MG TABS Take 10 mg by mouth at bedtime as needed (sleep).    [provider]  metFORMIN  (GLUCOPHAGE ) 500 MG tablet Take 1 tablet (500 mg total) by mouth 2 (two) times daily with a meal. 07/21/24   Verdon, Caren D, MD  metroNIDAZOLE  (METROGEL ) 1 % gel  Apply topically daily. Patient taking differently: Apply 1 application  topically daily as needed (rosacea). 01/29/21   Berneta Elsie Sayre, MD  Multiple Vitamin (MULTIVITAMIN) tablet Take 1 tablet by mouth daily.    [provider]  tamoxifen  (NOLVADEX ) 20 MG tablet Take 1 tablet (20 mg total) by mouth daily. 08/13/24   Iruku, Praveena, MD  tirzepatide  5 MG/0.5ML injection vial Inject 5 mg into the skin once a week. 07/21/24   Verdon Parry D, MD  tiZANidine  (ZANAFLEX ) 2 MG tablet TAKE 1-2 TABLETS (2-4 MG TOTAL) BY MOUTH EVERY 8 (EIGHT) HOURS AS NEEDED. 06/26/23   Joane Artist RAMAN, MD  traZODone  (DESYREL ) 50 MG tablet Take 50 mg by mouth at bedtime as needed. 11/03/23   [provider]  DOMINIC BECK 100-62.5-25 MCG/ACT AEPB  11/10/23   [provider]    Allergies: Amoxicillin and Percocet [oxycodone -acetaminophen ]    Review of Systems  Neurological:  Positive for weakness.  All other systems reviewed and are negative.   Updated Vital Signs BP 121/60   Pulse 97   Resp (!) 21   Wt 111 kg   SpO2 98%   BMI 43.35 kg/m   Physical Exam Vitals and nursing note reviewed.  Constitutional:      Appearance: She is obese.  HENT:     Head: Normocephalic and atraumatic.  Comments: Right sided facial droop    Right Ear: External ear normal.     Left Ear: External ear normal.     Nose: Nose normal.     Mouth/Throat:     Mouth: Mucous membranes are moist.     Pharynx: Oropharynx is clear.  Eyes:     Extraocular Movements: Extraocular movements intact.     Conjunctiva/sclera: Conjunctivae normal.     Pupils: Pupils are equal, round, and reactive to light.  Cardiovascular:     Rate and Rhythm: Normal rate and regular rhythm.     Pulses: Normal pulses.     Heart sounds: Normal heart sounds.  Pulmonary:     Effort: Pulmonary effort is normal.     Breath sounds: Normal breath sounds.  Musculoskeletal:        General: Normal range of motion.     Cervical  back: Normal range of motion and neck supple.  Skin:    General: Skin is warm.     Capillary Refill: Capillary refill takes less than 2 seconds.  Neurological:     Mental Status: She is alert.     Comments: Pt is alert and knows her name.  She is flaccid on the right with a right sided facial droop.  Gaze to the left.     (all labs ordered are listed, but only abnormal results are displayed) Labs Reviewed  CBG MONITORING, ED - Abnormal; Notable for the following components:      Result Value   Glucose-Capillary 132 (*)    All other components within normal limits  PROTIME-INR  APTT  CBC  DIFFERENTIAL  COMPREHENSIVE METABOLIC PANEL WITH GFR  ETHANOL  I-STAT CHEM 8, ED    EKG: None  Radiology: CT C-SPINE NO CHARGE Result Date: 09/03/2024 EXAM: CT CERVICAL SPINE WITHOUT CONTRAST 09/03/2024 07:50:46 PM TECHNIQUE: CT of the cervical spine was performed without the administration of intravenous contrast. Multiplanar reformatted images are provided for review. Automated exposure control, iterative reconstruction, and/or weight based adjustment of the mA/kV was utilized to reduce the radiation dose to as low as reasonably achievable. COMPARISON: None available. CLINICAL HISTORY: (Patient age 27y, gender F) FINDINGS: BONES AND ALIGNMENT: No acute fracture or traumatic malalignment. DEGENERATIVE CHANGES: Multilevel mild-to-moderate degenerative changes of the spine. No associated severe osseous or foraminal or central canal stenosis. SOFT TISSUES: No prevertebral soft tissue swelling. VASCULATURE: Atherosclerotic plaque of the aortic arch and its main branches. IMPRESSION: 1. No acute findings. Electronically signed by: Morgane Naveau MD 09/03/2024 07:58 PM EST RP Workstation: [id number]   CT HEAD CODE STROKE WO CONTRAST Result Date: 09/03/2024 EXAM: CT HEAD WITHOUT CONTRAST 09/03/2024 07:25:43 PM TECHNIQUE: CT of the head was performed without the administration of intravenous contrast.  Automated exposure control, iterative reconstruction, and/or weight based adjustment of the mA/kV was utilized to reduce the radiation dose to as low as reasonably achievable. COMPARISON: None available. CLINICAL HISTORY: Acute neurological deficit, stroke suspected. FINDINGS: BRAIN AND VENTRICLES: No acute hemorrhage. No evidence of acute infarct. No hydrocephalus. No extra-axial collection. No mass effect or midline shift. Streak artifact limiting evaluation of posterior fossa. Mild chronic microvascular ischemic changes. Atherosclerosis of the carotid siphons. [place] stroke program early CT (ASPECT) score: Ganglionic (caudate, IC, lentiform nucleus, insula, M1-M3): 7 Supraganglionic (M4-M6): 3 Total: 10 ORBITS: Bilateral lens replacement. SINUSES: Mucosal thickening within left sphenoid sinus. SOFT TISSUES AND SKULL: Hyperostosis frontalis interna. No acute soft tissue abnormality. No skull fracture. IMPRESSION: 1. No acute intracranial abnormality. 2. ASPECTS score  10. 3. Mild chronic microvascular ischemic changes. 4. Findings messaged to Dr. Michaela via the [facility] messaging system at 7:33 PM on 09/03/24. Electronically signed by: Donnice Mania MD 09/03/2024 07:34 PM EST RP Workstation: [id number]     Procedures   Medications Ordered in the ED  tenecteplase  (TNKASE ) injection for stroke 25 mg (25 mg Intravenous Given 09/03/24 1934)  iohexol  (OMNIPAQUE ) 350 MG/ML injection 75 mL (75 mLs Intravenous Contrast Given 09/03/24 1950)  ondansetron  (ZOFRAN ) injection 4 mg (4 mg Intravenous Given 09/03/24 1954)                                    Medical Decision Making Amount and/or Complexity of Data Reviewed Labs: ordered. Radiology: ordered.  Risk Prescription drug management. Decision regarding hospitalization.   This patient presents to the ED for concern of weakness, this involves an extensive number of treatment options, and is a complaint that carries with it a high risk of complications  and morbidity.  The differential diagnosis includes cva, trauma, tia, head injury   Co morbidities that complicate the patient evaluation  arthritis, endometrial cancer, COPD, HTN, CKD, glaucoma, anxiety, breast cancer, and sleep apnea   Additional history obtained:  Additional history obtained from epic chart review External records from outside source obtained and reviewed including EMS report   Lab Tests:  I Ordered, and personally interpreted labs.  The pertinent results include:  cmp nl   Imaging Studies ordered:  I ordered imaging studies including ct head/ct c-spine/cta head/neck  I independently visualized and interpreted imaging which showed  CT head: . No acute intracranial abnormality.  2. ASPECTS score 10.  3. Mild chronic microvascular ischemic changes.  CTA head/neck: Acute occlusion of the proximal M1 segment of the left MCA. There is  contrast filling of a few distal left MCA branches, likely via mild  collaterals.  2. No hemodynamically significant stenosis in the cervical carotid or vertebral  arteries.   I agree with the radiologist interpretation   Cardiac Monitoring:  The patient was maintained on a cardiac monitor.  I personally viewed and interpreted the cardiac monitored which showed an underlying rhythm of: nsr   Medicines ordered and prescription drug management:  I ordered medication including tnk  for cva  Reevaluation of the patient after these medicines showed that the patient improved I have reviewed the patients home medicines and have made adjustments as needed   Test Considered:  Cta   Critical Interventions:  Code stroke/code IR   Consultations Obtained:  I requested consultation with the neurologist (Dr. Michaela),  and discussed lab and imaging findings as well as pertinent plan - he talked with family and ordered TNK.  He also d/w IR and pt will go directly to IR.   Problem List / ED Course:  CVA:  pt given  TNK, she also has a M1 occlusion and will go to IR   Reevaluation:  After the interventions noted above, I reevaluated the patient and found that they have :improved   Social Determinants of Health:  Lives at home   Dispostion:  After consideration of the diagnostic results and the patients response to treatment, I feel that the patent would benefit from admission.  CRITICAL CARE Performed by: Mliss Boyers   Total critical care time: 30 minutes  Critical care time was exclusive of separately billable procedures and treating other patients.  Critical care was necessary to treat  or prevent imminent or life-threatening deterioration.  Critical care was time spent personally by me on the following activities: development of treatment plan with patient and/or surrogate as well as nursing, discussions with consultants, evaluation of patient's response to treatment, examination of patient, obtaining history from patient or surrogate, ordering and performing treatments and interventions, ordering and review of laboratory studies, ordering and review of radiographic studies, pulse oximetry and re-evaluation of patient's condition.        Final diagnoses:  Cerebrovascular accident (CVA), unspecified mechanism [facility])    ED Discharge Orders     None          Dean Clarity, MD 09/04/24 1738  "

## 2024-09-03 NOTE — Anesthesia Postprocedure Evaluation (Signed)
"   Anesthesia Post Note  Patient: Kristi Hunter  Procedure(s) Performed: RADIOLOGY WITH ANESTHESIA (Left)     Patient location during evaluation: ICU Anesthesia Type: General Level of consciousness: patient remains intubated per anesthesia plan Vital Signs Assessment: post-procedure vital signs reviewed and stable Respiratory status: patient remains intubated per anesthesia plan Anesthetic complications: no   No notable events documented.  Last Vitals:  Vitals:   09/03/24 2200 09/03/24 2215  BP: 132/64 (!) 140/64  Pulse: 87 92  Resp: 18 (!) 21  Temp:    SpO2: 100% 100%    Last Pain:  Vitals:   09/03/24 2150  TempSrc: Axillary                 [name]      "

## 2024-09-03 NOTE — H&P (Signed)
 Neurology H&P  CC: Right-sided weakness  History is obtained from: Chart review  HPI: Kristi Hunter is a 84 y.o. female with a history of hypertension, prediabetes, lymphedema who presents with right-sided weakness that started abruptly at 6 PM.  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  She did reportedly hit her head, but had symptoms prior to the fall.  She was emergently transported as a code stroke based on right-sided weakness and on arrival she was evaluated at the bridge and taken emergently to the CT scanner.  In the CT scanner, her plain CT was negative for hemorrhage and I discussed IV thrombolytics with the patient's daughter/granddaughter and then again with the patient's sister who is the POA  Of note, the patient has had two brain bleeds in the past.  After discussion with the daughter, these were both posttraumatic bleeds, not spontaneous ICH.  I did try to obtain records, however was not able to do so in a timely fashion to facilitate TNK decision.  I discussed the risks, benefits,   At baseline, she is fairly independent, living in independent living, occasionally needs some help and uses a walker.  LKW: 6 PM tpa given?:  Yes IR Thrombectomy?  Yes Modified Rankin Scale: 3-Moderate disability-requires help but walks WITHOUT assistance NIHSS: 16    Past Medical History:  Diagnosis Date   Anemia    Anxiety    Arthritis    several different parts of the body per pt 10/17/2022   Back pain    Breast cancer (HCC)    02/20/21   Cancer (HCC)    uterine   Chest pain    Chicken pox    Chronic kidney disease    Constipation    Emphysema of lung (HCC)    Family history of breast cancer 02/14/2021   Glaucoma    Hypertension    Joint pain    Lower extremity edema    Lymphedema    Osteoarthritis    Personal history of malignant neoplasm of uterus 02/14/2021    Pre-diabetes    Prediabetes    Shortness of breath    Sleep apnea    doesn't use CPAP machine anymore   Urine incontinence    Uterine cancer (HCC)    2011   UTI (urinary tract infection)    Vertigo    had vertigo last week. 10/17/2022   Vitamin B 12 deficiency    during pregnancy     Family History  Problem Relation Age of Onset   Heart attack Mother    Heart disease Mother    Hyperlipidemia Mother    Hypertension Mother    Stroke Mother    Miscarriages / Stillbirths Mother    Diabetes Mother    Thyroid  disease Mother    Cancer Mother    Obesity Mother    Melanoma Mother        dx after 71, sun-exposed areas   Cancer Father        skin   Alcohol abuse Father    Asthma Father    COPD Father    Heart attack Father    Heart disease Father    Obesity Father    Melanoma Father        dx after 51, back   Alcohol abuse Brother    Arthritis Brother    Drug abuse Brother    Alcohol  abuse Brother    Cancer Brother        ? esophageal, d. 4   Breast cancer Paternal Aunt 31   Arthritis Maternal Grandmother    Diabetes Maternal Grandmother    Hyperlipidemia Maternal Grandmother    Colon cancer Maternal Grandfather        dx 80, dx 67   Alcohol abuse Paternal Grandfather    Arthritis Paternal Grandfather    Cirrhosis Paternal Grandfather    Depression Daughter    Hypertension Daughter    Mental illness Daughter    Colon polyps Daughter    Depression Son      Social History:  reports that she quit smoking about 42 years ago. Her smoking use included cigarettes. She has never been exposed to tobacco smoke. She has never used smokeless tobacco. She reports current alcohol use of about 1.0 standard drink of alcohol per week. She reports that she does not use drugs.   Prior to Admission medications  Medication Sig Start Date End Date Taking? Authorizing Provider  acetaminophen  (TYLENOL ) 650 MG CR tablet Take 650 mg by mouth every 8 (eight) hours as needed for pain.  Takes usually once a day    [provider]  alendronate  (FOSAMAX ) 70 MG tablet TAKE 1 TABLET (70 MG TOTAL) BY MOUTH EVERY 7 (SEVEN) DAYS. TAKE WITH A FULL GLASS OF WATER ON AN EMPTY STOMACH. 04/04/22   Berneta Elsie Sayre, MD  chlorthalidone  (HYGROTON ) 25 MG tablet Take 1 tablet (25 mg total) by mouth daily. 03/15/22   Berneta Elsie Sayre, MD  diclofenac Sodium (VOLTAREN) 1 % GEL Apply 2 g topically daily as needed (Osteoarthritis).    [provider]  fluticasone  (FLONASE ) 50 MCG/ACT nasal spray Place 2 sprays into both nostrils in the morning and at bedtime. 04/17/23   Verdon Parry D, MD  furosemide  (LASIX ) 20 MG tablet Take 1 tablet (20 mg total) by mouth daily. 05/06/24   Emelia Josefa HERO, NP  Melatonin 10 MG TABS Take 10 mg by mouth at bedtime as needed (sleep).    [provider]  metFORMIN  (GLUCOPHAGE ) 500 MG tablet Take 1 tablet (500 mg total) by mouth 2 (two) times daily with a meal. 07/21/24   Verdon, Caren D, MD  metroNIDAZOLE  (METROGEL ) 1 % gel Apply topically daily. Patient taking differently: Apply 1 application  topically daily as needed (rosacea). 01/29/21   Berneta Elsie Sayre, MD  Multiple Vitamin (MULTIVITAMIN) tablet Take 1 tablet by mouth daily.    [provider]  tamoxifen  (NOLVADEX ) 20 MG tablet Take 1 tablet (20 mg total) by mouth daily. 08/13/24   Iruku, Praveena, MD  tirzepatide  5 MG/0.5ML injection vial Inject 5 mg into the skin once a week. 07/21/24   Verdon Parry D, MD  tiZANidine  (ZANAFLEX ) 2 MG tablet TAKE 1-2 TABLETS (2-4 MG TOTAL) BY MOUTH EVERY 8 (EIGHT) HOURS AS NEEDED. 06/26/23   Joane Artist RAMAN, MD  traZODone  (DESYREL ) 50 MG tablet Take 50 mg by mouth at bedtime as needed. 11/03/23   [provider]  DOMINIC BECK 100-62.5-25 MCG/ACT AEPB  11/10/23   [provider]     Exam: Current vital signs: BP (!) 86/62   Pulse 100   Resp 20   Wt 111 kg   SpO2 100%   BMI 43.35 kg/m    Physical Exam   Constitutional: Appears well-developed and well-nourished.  Psych: Affect appropriate to situation Eyes: No scleral injection HENT: No OP obstrucion Head: Normocephalic.  Cardiovascular: Normal  rate and regular rhythm.  Respiratory: Effort normal, nonlabored breathing GI: Soft.  No distension. There is no tenderness.  EXT: Bilateral edema Skin: WDI  Neuro: Mental Status: Patient is awake, alert, oriented to person, place, month, year, and situation. She is able to answer questions, with fluent speech, able to follow commands readily.  She is able to name simple objects cranial Nerves: II: Visual Fields are full. Pupils are equal, round, and reactive to light.   III,IV, VI: She has a left gaze deviation V: Facial sensation is symmetric to temperature VII: Facial movement is weak on the right Motor: She has increased tone with severe weakness of the right arm and leg, moves left side well Sensory: Sensation is symmetric to light touch  Cerebellar: Nuclear ataxia in the left  I have reviewed labs in epic and the pertinent results are: Results for orders placed or performed during the hospital encounter of 09/03/24 (from the past 24 hours)  CBG monitoring, ED     Status: Abnormal   Collection Time: 09/03/24  7:14 PM  Result Value Ref Range   Glucose-Capillary 132 (H) 70 - 99 mg/dL     I have reviewed the images obtained: CT head is negative, CTA with left MCA occlusion  Primary Diagnosis:  Cerebral infarction due to occlusion or stenosis of left middle cerebral artery.   Secondary Diagnosis: Essential (primary) hypertension   Impression: 84 year old female with acute right-sided weakness, gaze deviation with an occluded left M1.  She does have some collateralization, and I suspect that is why she is not aphasic and does not have a hemianopia, but she does have very severe symptoms and therefore I did discuss mechanical thrombectomy with both patient and her sister who  wished to proceed on an emergent basis.  Plan: - HgbA1c, fasting lipid panel - MRI brain - Frequent neuro checks - Echocardiogram - Prophylactic therapy-none for 24 hours - Risk factor modification - Telemetry monitoring - PT consult, OT consult, Speech consult - Stroke team to follow    This patient is critically ill and at significant risk of neurological worsening, death and care requires constant monitoring of vital signs, hemodynamics,respiratory and cardiac monitoring, neurological assessment, discussion with family, other specialists and medical decision making of high complexity. I spent 65 minutes of neurocritical care time  in the care of  this patient. This was time spent independent of any time provided by nurse practitioner or PA.  Aisha Seals, MD [facility]   If 7pm- 7am, please page neurology on call as listed in AMION.

## 2024-09-03 NOTE — Anesthesia Procedure Notes (Signed)
 Procedure Name: Intubation Date/Time: 09/03/2024 8:08 PM  Performed by: Muslima Toppins T, CRNAPre-anesthesia Checklist: Patient identified, Emergency Drugs available, Suction available and Patient being monitored Patient Re-evaluated:Patient Re-evaluated prior to induction Oxygen  Delivery Method: Circle system utilized Preoxygenation: Pre-oxygenation with 100% oxygen  Induction Type: IV induction, Rapid sequence and Cricoid Pressure applied Ventilation: Mask ventilation without difficulty Laryngoscope Size: Mac and 3 Grade View: Grade II Tube type: Oral Tube size: 7.0 mm Number of attempts: 1 Airway Equipment and Method: Stylet and Oral airway Placement Confirmation: ETT inserted through vocal cords under direct vision, positive ETCO2 and breath sounds checked- equal and bilateral Secured at: 22 cm Tube secured with: Tape Dental Injury: Teeth and Oropharynx as per pre-operative assessment

## 2024-09-03 NOTE — Anesthesia Preprocedure Evaluation (Signed)
"                                    Anesthesia Evaluation  Patient identified by MRN, date of birth, ID band Patient awake    Reviewed: Allergy & Precautions, NPO status , Patient's Chart, lab work & pertinent test results  History of Anesthesia Complications Negative for: history of anesthetic complications  Airway Mallampati: Unable to assess       Dental  (+) Teeth Intact, Dental Advisory Given, Poor Dentition, Chipped   Pulmonary sleep apnea , COPD, former smoker   breath sounds clear to auscultation       Cardiovascular hypertension, Pt. on medications  Rhythm:Regular Rate:Normal  TTE (2024): 1. Left ventricular ejection fraction, by estimation, is 60 to 65%. The  left ventricle has normal function. The left ventricle has no regional  wall motion abnormalities. Left ventricular diastolic parameters are  consistent with Grade I diastolic  dysfunction (impaired relaxation). GLS-15.6%   2. Right ventricular systolic function is normal. The right ventricular  size is normal.   3. The mitral valve is normal in structure. No evidence of mitral valve  regurgitation. No evidence of mitral stenosis.   4. The aortic valve is normal in structure. Aortic valve regurgitation is  not visualized. No aortic stenosis is present.   5. The inferior vena cava is normal in size with greater than 50%  respiratory variability, suggesting right atrial pressure of 3 mmHg.     Neuro/Psych  PSYCHIATRIC DISORDERS Anxiety Depression     Code Stroke - oriented to name, DOB, location, not to year (thought it was 2025), slurred speech, fixed gaze to the left.  CVA, Residual Symptoms    GI/Hepatic   Endo/Other  neg diabetes    Renal/GU Renal InsufficiencyRenal disease     Musculoskeletal  (+) Arthritis ,    Abdominal   Peds  Hematology  (+) Blood dyscrasia, anemia   Anesthesia Other Findings   Reproductive/Obstetrics  Hx of Breast and Uterine Cancer                               Anesthesia Physical Anesthesia Plan  ASA: 3 and emergent  Anesthesia Plan: General   Post-op Pain Management:    Induction: Intravenous and Rapid sequence  PONV Risk Score and Plan: 2 and Treatment may vary due to age or medical condition  Airway Management Planned: Oral ETT  Additional Equipment:   Intra-op Plan:   Post-operative Plan: Possible Post-op intubation/ventilation  Informed Consent: I have reviewed the patients History and Physical, chart, labs and discussed the procedure including the risks, benefits and alternatives for the proposed anesthesia with the patient or authorized representative who has indicated his/her understanding and acceptance.     Only emergency history available  Plan Discussed with: CRNA  Anesthesia Plan Comments:          Anesthesia Quick Evaluation  "

## 2024-09-03 NOTE — Procedures (Signed)
" °  NEUROSURGERY BRIEF THROMBECTOMY NOTE   PREOP DX: Acute ischemic stroke  POSTOP DX: Same  PROCEDURE: Left M1 thrombectomy  SURGEON: Park Beck   ANESTHESIA: GETA  EBL: Minimal  Number of Passes: 1  Technique: ASPIRATION  Final TICI score: 2B  Post OP blood pressure goal: SBP<160  Arterial Angioplasty or Stent: No   Anti-Platelet Therapy: No   COMPLICATIONS: No   CONDITION: Stable to recovery  FINDINGS (Full report in [facility]): 1. To ICU for postoperative care. Patient to remain intubated for transport secondary to anesthesia concerns (airway, mental status, secretions).   Kristi Hunter  @today @ 8:50 PM  "

## 2024-09-03 NOTE — ED Triage Notes (Signed)
 Pt BIB EMS from [facility] facility for code stroke. LKW was 1800, had a witnessed fall and hit her head, no blood thinners reported. Pt presents with R sided weakness and decreased sensation, some slurred speech and gaze locked to the L side.  EMS VS  144/70 96 HR 99% NRB 15L GCS 11

## 2024-09-04 ENCOUNTER — Inpatient Hospital Stay (HOSPITAL_COMMUNITY)

## 2024-09-04 DIAGNOSIS — Z7985 Long-term (current) use of injectable non-insulin antidiabetic drugs: Secondary | ICD-10-CM

## 2024-09-04 DIAGNOSIS — Z6841 Body Mass Index (BMI) 40.0 and over, adult: Secondary | ICD-10-CM

## 2024-09-04 DIAGNOSIS — F419 Anxiety disorder, unspecified: Secondary | ICD-10-CM | POA: Diagnosis not present

## 2024-09-04 DIAGNOSIS — E1151 Type 2 diabetes mellitus with diabetic peripheral angiopathy without gangrene: Secondary | ICD-10-CM | POA: Diagnosis not present

## 2024-09-04 DIAGNOSIS — I129 Hypertensive chronic kidney disease with stage 1 through stage 4 chronic kidney disease, or unspecified chronic kidney disease: Secondary | ICD-10-CM | POA: Diagnosis not present

## 2024-09-04 DIAGNOSIS — G4733 Obstructive sleep apnea (adult) (pediatric): Secondary | ICD-10-CM

## 2024-09-04 DIAGNOSIS — I63412 Cerebral infarction due to embolism of left middle cerebral artery: Secondary | ICD-10-CM | POA: Diagnosis not present

## 2024-09-04 DIAGNOSIS — J9589 Other postprocedural complications and disorders of respiratory system, not elsewhere classified: Secondary | ICD-10-CM | POA: Diagnosis not present

## 2024-09-04 DIAGNOSIS — I6389 Other cerebral infarction: Secondary | ICD-10-CM

## 2024-09-04 DIAGNOSIS — R7303 Prediabetes: Secondary | ICD-10-CM | POA: Diagnosis not present

## 2024-09-04 DIAGNOSIS — J95821 Acute postprocedural respiratory failure: Secondary | ICD-10-CM

## 2024-09-04 DIAGNOSIS — E669 Obesity, unspecified: Secondary | ICD-10-CM | POA: Diagnosis not present

## 2024-09-04 DIAGNOSIS — I890 Lymphedema, not elsewhere classified: Secondary | ICD-10-CM | POA: Diagnosis not present

## 2024-09-04 DIAGNOSIS — N189 Chronic kidney disease, unspecified: Secondary | ICD-10-CM | POA: Diagnosis not present

## 2024-09-04 DIAGNOSIS — Z7984 Long term (current) use of oral hypoglycemic drugs: Secondary | ICD-10-CM | POA: Diagnosis not present

## 2024-09-04 DIAGNOSIS — I10 Essential (primary) hypertension: Secondary | ICD-10-CM | POA: Diagnosis not present

## 2024-09-04 DIAGNOSIS — I63512 Cerebral infarction due to unspecified occlusion or stenosis of left middle cerebral artery: Secondary | ICD-10-CM | POA: Diagnosis not present

## 2024-09-04 DIAGNOSIS — I69391 Dysphagia following cerebral infarction: Secondary | ICD-10-CM

## 2024-09-04 DIAGNOSIS — E785 Hyperlipidemia, unspecified: Secondary | ICD-10-CM

## 2024-09-04 DIAGNOSIS — J449 Chronic obstructive pulmonary disease, unspecified: Secondary | ICD-10-CM | POA: Diagnosis not present

## 2024-09-04 DIAGNOSIS — J9600 Acute respiratory failure, unspecified whether with hypoxia or hypercapnia: Secondary | ICD-10-CM | POA: Diagnosis not present

## 2024-09-04 LAB — CBC WITH DIFFERENTIAL/PLATELET
Abs Immature Granulocytes: 0.05 10*3/uL (ref 0.00–0.07)
Basophils Absolute: 0.1 10*3/uL (ref 0.0–0.1)
Basophils Relative: 0 %
Eosinophils Absolute: 0.1 10*3/uL (ref 0.0–0.5)
Eosinophils Relative: 0 %
HCT: 31.6 % — ABNORMAL LOW (ref 36.0–46.0)
Hemoglobin: 10.6 g/dL — ABNORMAL LOW (ref 12.0–15.0)
Immature Granulocytes: 0 %
Lymphocytes Relative: 15 %
Lymphs Abs: 2.1 10*3/uL (ref 0.7–4.0)
MCH: 27.9 pg (ref 26.0–34.0)
MCHC: 33.5 g/dL (ref 30.0–36.0)
MCV: 83.2 fL (ref 80.0–100.0)
Monocytes Absolute: 0.8 10*3/uL (ref 0.1–1.0)
Monocytes Relative: 6 %
Neutro Abs: 11.1 10*3/uL — ABNORMAL HIGH (ref 1.7–7.7)
Neutrophils Relative %: 79 %
Platelets: 335 10*3/uL (ref 150–400)
RBC: 3.8 MIL/uL — ABNORMAL LOW (ref 3.87–5.11)
RDW: 14 % (ref 11.5–15.5)
WBC: 14.1 10*3/uL — ABNORMAL HIGH (ref 4.0–10.5)
nRBC: 0 % (ref 0.0–0.2)

## 2024-09-04 LAB — POCT I-STAT 7, (LYTES, BLD GAS, ICA,H+H)
Acid-Base Excess: 3 mmol/L — ABNORMAL HIGH (ref 0.0–2.0)
Bicarbonate: 28.3 mmol/L — ABNORMAL HIGH (ref 20.0–28.0)
Calcium, Ion: 1.18 mmol/L (ref 1.15–1.40)
HCT: 31 % — ABNORMAL LOW (ref 36.0–46.0)
Hemoglobin: 10.5 g/dL — ABNORMAL LOW (ref 12.0–15.0)
O2 Saturation: 100 %
Patient temperature: 97.5
Potassium: 2.6 mmol/L — CL (ref 3.5–5.1)
Sodium: 137 mmol/L (ref 135–145)
TCO2: 30 mmol/L (ref 22–32)
pCO2 arterial: 44.5 mmHg (ref 32–48)
pH, Arterial: 7.408 (ref 7.35–7.45)
pO2, Arterial: 172 mmHg — ABNORMAL HIGH (ref 83–108)

## 2024-09-04 LAB — ECHOCARDIOGRAM COMPLETE
Area-P 1/2: 2.8 cm2
Calc EF: 69 %
S' Lateral: 2.1 cm
Single Plane A2C EF: 75.3 %
Single Plane A4C EF: 64.1 %
Weight: 3915.37 [oz_av]

## 2024-09-04 LAB — GLUCOSE, CAPILLARY
Glucose-Capillary: 113 mg/dL — ABNORMAL HIGH (ref 70–99)
Glucose-Capillary: 123 mg/dL — ABNORMAL HIGH (ref 70–99)
Glucose-Capillary: 124 mg/dL — ABNORMAL HIGH (ref 70–99)
Glucose-Capillary: 135 mg/dL — ABNORMAL HIGH (ref 70–99)
Glucose-Capillary: 139 mg/dL — ABNORMAL HIGH (ref 70–99)
Glucose-Capillary: 151 mg/dL — ABNORMAL HIGH (ref 70–99)

## 2024-09-04 LAB — LIPID PANEL
Cholesterol: 140 mg/dL (ref 0–200)
HDL: 49 mg/dL
LDL Cholesterol: 73 mg/dL (ref 0–99)
Total CHOL/HDL Ratio: 2.9 ratio
Triglycerides: 90 mg/dL
VLDL: 18 mg/dL (ref 0–40)

## 2024-09-04 LAB — BASIC METABOLIC PANEL WITH GFR
Anion gap: 12 (ref 5–15)
Anion gap: 9 (ref 5–15)
BUN: 18 mg/dL (ref 8–23)
BUN: 15 mg/dL (ref 8–23)
CO2: 26 mmol/L (ref 22–32)
CO2: 29 mmol/L (ref 22–32)
Calcium: 9.2 mg/dL (ref 8.9–10.3)
Calcium: 9.1 mg/dL (ref 8.9–10.3)
Chloride: 100 mmol/L (ref 98–111)
Chloride: 104 mmol/L (ref 98–111)
Creatinine, Ser: 0.73 mg/dL (ref 0.44–1.00)
Creatinine, Ser: 0.77 mg/dL (ref 0.44–1.00)
GFR, Estimated: >60 mL/min
GFR, Estimated: >60 mL/min
Glucose, Bld: 140 mg/dL — ABNORMAL HIGH (ref 70–99)
Glucose, Bld: 138 mg/dL — ABNORMAL HIGH (ref 70–99)
Potassium: 2.7 mmol/L — CL (ref 3.5–5.1)
Potassium: 4.2 mmol/L (ref 3.5–5.1)
Sodium: 138 mmol/L (ref 135–145)
Sodium: 142 mmol/L (ref 135–145)

## 2024-09-04 LAB — HEMOGLOBIN A1C
Hgb A1c MFr Bld: 5.8 % — ABNORMAL HIGH (ref 4.8–5.6)
Mean Plasma Glucose: 119.76 mg/dL

## 2024-09-04 LAB — T4, FREE: Free T4: 1.36 ng/dL (ref 0.80–2.00)

## 2024-09-04 LAB — MRSA NEXT GEN BY PCR, NASAL: MRSA by PCR Next Gen: NOT DETECTED

## 2024-09-04 LAB — MAGNESIUM: Magnesium: 1.8 mg/dL (ref 1.7–2.4)

## 2024-09-04 LAB — TSH: TSH: 0.776 u[IU]/mL (ref 0.350–4.500)

## 2024-09-04 LAB — TRIGLYCERIDES: Triglycerides: 91 mg/dL

## 2024-09-04 MED ORDER — ARFORMOTEROL TARTRATE 15 MCG/2ML IN NEBU
15.0000 ug | INHALATION_SOLUTION | Freq: Two times a day (BID) | RESPIRATORY_TRACT | Status: DC
  Administered 2024-09-04 – 2024-09-07 (×7): 15 ug via RESPIRATORY_TRACT
  Filled 2024-09-04 (×6): qty 2

## 2024-09-04 MED ORDER — REVEFENACIN 175 MCG/3ML IN SOLN
175.0000 ug | Freq: Every day | RESPIRATORY_TRACT | Status: DC
  Administered 2024-09-04 – 2024-09-07 (×4): 175 ug via RESPIRATORY_TRACT
  Filled 2024-09-04 (×4): qty 3

## 2024-09-04 MED ORDER — DEXMEDETOMIDINE HCL IN NACL 400 MCG/100ML IV SOLN
0.0000 ug/kg/h | INTRAVENOUS | Status: DC
  Administered 2024-09-04: 0.4 ug/kg/h via INTRAVENOUS
  Filled 2024-09-04: qty 100

## 2024-09-04 MED ORDER — NOREPINEPHRINE 4 MG/250ML-% IV SOLN
0.0000 ug/min | INTRAVENOUS | Status: DC
  Administered 2024-09-04: 8 ug/min via INTRAVENOUS
  Administered 2024-09-04: 5 ug/min via INTRAVENOUS
  Administered 2024-09-05: 2 ug/min via INTRAVENOUS
  Administered 2024-09-05: 9 ug/min via INTRAVENOUS
  Filled 2024-09-04 (×5): qty 250

## 2024-09-04 MED ORDER — POTASSIUM CHLORIDE 10 MEQ/100ML IV SOLN
10.0000 meq | INTRAVENOUS | Status: DC
  Administered 2024-09-04: 10 meq via INTRAVENOUS
  Filled 2024-09-04: qty 100

## 2024-09-04 MED ORDER — ATORVASTATIN CALCIUM 40 MG PO TABS: 40.0000 mg | ORAL_TABLET | Freq: Every day | ORAL | Status: DC

## 2024-09-04 MED ORDER — FENTANYL CITRATE (PF) 50 MCG/ML IJ SOSY
25.0000 ug | PREFILLED_SYRINGE | INTRAMUSCULAR | Status: DC | PRN
  Administered 2024-09-04: 50 ug via INTRAVENOUS
  Administered 2024-09-05 (×3): 25 ug via INTRAVENOUS
  Administered 2024-09-06: 50 ug via INTRAVENOUS
  Filled 2024-09-04 (×5): qty 1

## 2024-09-04 MED ORDER — POTASSIUM CHLORIDE 20 MEQ PO PACK: 40.0000 meq | PACK | ORAL | Status: DC

## 2024-09-04 MED ORDER — POTASSIUM CHLORIDE 10 MEQ/100ML IV SOLN: 10.0000 meq | INTRAVENOUS | Status: DC

## 2024-09-04 MED ORDER — POTASSIUM CHLORIDE 20 MEQ PO PACK
40.0000 meq | PACK | Freq: Once | ORAL | Status: AC
  Administered 2024-09-04: 40 meq
  Filled 2024-09-04: qty 2

## 2024-09-04 MED ORDER — POTASSIUM CHLORIDE 10 MEQ/100ML IV SOLN
10.0000 meq | INTRAVENOUS | Status: AC
  Administered 2024-09-04 (×3): 10 meq via INTRAVENOUS
  Filled 2024-09-04 (×2): qty 100

## 2024-09-04 MED ORDER — BUDESONIDE 0.25 MG/2ML IN SUSP
0.2500 mg | Freq: Two times a day (BID) | RESPIRATORY_TRACT | Status: DC
  Administered 2024-09-04 – 2024-09-07 (×7): 0.25 mg via RESPIRATORY_TRACT
  Filled 2024-09-04 (×7): qty 2

## 2024-09-04 MED ORDER — DEXMEDETOMIDINE HCL IN NACL 400 MCG/100ML IV SOLN
INTRAVENOUS | Status: AC
  Filled 2024-09-04: qty 100

## 2024-09-04 MED ORDER — MAGNESIUM SULFATE 2 GM/50ML IV SOLN
2.0000 g | Freq: Once | INTRAVENOUS | Status: AC
  Administered 2024-09-04: 2 g via INTRAVENOUS
  Filled 2024-09-04: qty 50

## 2024-09-04 MED ORDER — SODIUM CHLORIDE 0.9 % IV SOLN: 250.0000 mL | INTRAVENOUS | Status: AC

## 2024-09-04 MED ORDER — POTASSIUM CHLORIDE 10 MEQ/100ML IV SOLN
10.0000 meq | INTRAVENOUS | Status: AC
  Administered 2024-09-04 (×2): 10 meq via INTRAVENOUS
  Filled 2024-09-04 (×2): qty 100

## 2024-09-04 MED ORDER — ATORVASTATIN CALCIUM 40 MG PO TABS
40.0000 mg | ORAL_TABLET | Freq: Every day | ORAL | Status: DC
  Administered 2024-09-04 – 2024-09-05 (×2): 40 mg
  Filled 2024-09-04 (×2): qty 1

## 2024-09-04 NOTE — Progress Notes (Addendum)
 Pharmacy Electrolyte Replacement  Recent Labs:  Recent Labs    09/04/24 1215  K 2.7*  MG 1.8  CREATININE 0.73    Low Critical Values (K </= 2.5, Phos </= 1, Mg </= 1) Present: None  MD Contacted: Dr. Theodoro  Plan:  IV KCL 40 mEq IV x1 PO KCL 40 mEq per tube x1 Mg 2g IV x1 Recheck with PM BMP   Thank you for allowing pharmacy to be a part of this patients care.  Shelba Collier, PharmD, BCPS Clinical Pharmacist

## 2024-09-04 NOTE — Consult Note (Addendum)
 "  NAME:  Kristi Hunter, MRN:  [id number], DOB:  01-24-1941, LOS: 1 ADMISSION DATE:  09/03/2024, CONSULTATION DATE: 1/24 REFERRING MD: 1/24 CHIEF COMPLAINT: CVA  History of Present Illness:  Pt is a 84 year old female with significant past medical history of HTN, lymphedema, prediabetes,, breast cancer hx on tamoxifen , morbid obesity, Obstructive Sleep Apnea, COPD and anxiety who presents to [facility], ED for fall and right sided weakness by EMS from [facility] facility.  Last known well was approximately 1800, patient had a witnessed fall and did hit her head.  Per rapid response note code stroke was called at 1847 for left-sided gaze, dysarthria, right-sided weakness.  CT of head was negative hemorrhage, negative for acute intracranial abnormality, CT of cervical spine-negative for acute changes, CT angio head and neck noted an acute occlusion of the proximal M1 segment of the left MCA, and no significant stenosis in cervical carotid/vertebral arteries.  Patient was ultimately given TNK at 1934, and interventional radiology was activated for code stroke in which a left M1 thrombectomy was performed. Patient remained intubated after procedure.  PCCM consulted to assist with critical care and vent management while residing in ICU.   Pertinent  Medical History   Past Medical History:  Diagnosis Date   Anemia    Anxiety    Arthritis    several different parts of the body per pt 10/17/2022   Back pain    Breast cancer (HCC)    02/20/21   Cancer (HCC)    uterine   Chest pain    Chicken pox    Chronic kidney disease    Constipation    Emphysema of lung (HCC)    Family history of breast cancer 02/14/2021   Glaucoma    Hypertension    Joint pain    Lower extremity edema    Lymphedema    Osteoarthritis    Personal history of malignant neoplasm of uterus 02/14/2021   Pre-diabetes    Prediabetes    Shortness of breath    Sleep apnea    doesn't use CPAP machine anymore   Urine  incontinence    Uterine cancer (HCC)    2011   UTI (urinary tract infection)    Vertigo    had vertigo last week. 10/17/2022   Vitamin B 12 deficiency    during pregnancy     Significant Hospital Events: Including procedures, antibiotic start and stop dates in addition to other pertinent events   1/23-code stroke,  acute occlusion of the proximal M1 segment of the left MCA> IR s/p left M1 thrombectomy  Interim History / Subjective:  Patient intubated, sedated on propofol   On pressure support, 10/5  Purposeful movement noted throughout all extremities  Right hand hematoma  Objective    Blood pressure (!) 128/44, pulse (!) 54, temperature (!) 97.3 F (36.3 C), temperature source Axillary, resp. rate 13, weight 111 kg, SpO2 100%.    Vent Mode: PSV;CPAP FiO2 (%):  [40 %] 40 % PEEP:  [5 cmH20] 5 cmH20 Pressure Support:  [10 cmH20] 10 cmH20   Intake/Output Summary (Last 24 hours) at 09/04/2024 0021 Last data filed at 09/04/2024 0009 Gross per 24 hour  Intake 444.29 ml  Output --  Net 444.29 ml   Filed Weights   09/03/24 1918  Weight: 111 kg    Examination: General: Acute on chronic older adult female, lying in ICU bed on vent s/p thrombectomy NAD HENT: Normocephalic, PERRL intact-2 mm/BL, poor dentition, pink MM, ETT, OG Lungs: Clear,  diminished throughout, on pressure support mode via vent Cardiovascular: S1, S2, RRR-sinus bradycardia, no MRG, no JVD Abdomen: Bowel sounds active, soft Extremities: Right FEM site, stable-no signs of hematoma or bleeding, right hand hematoma-stable for now, moves all extremities to painful stimuli Neuro: RASS -2, on propofol , cough/gag reflex intact, responds to painful stimuli GU: Pure wick in place-clear yellow urine noticed in canister  Resolved problem list   Assessment and Plan  Acute CVA of M1 segment of left MCA s/p TNK and left M1 thrombectomy LKW- 1800- 1/23, TNK given at 1934  CT of head/C-spine-negative, CT head/neck  angio-acute occlusion of the proximal M1 segment of the left MCA> IR s/p left M1 thrombectomy Acute respiratory insufficiency/postop-intubation in setting of above History of Obstructive sleep apnea- noncompliance with CPAP COPD- 01/01/2024 noting ratio 82% FEV1 62% FVC 75% DLCO 41% - on trelegy  Follows up with [facility], no smoking hx  P: Continue frequent neurochecks Will eventually need MRI Obtain hemoglobin A1c Obtain lipid profile, TSH, free T4, and obtain CBC in a.m. Obtain echocardiogram Per neuro's orders SBP goal 120-160 for first 24 hours postprocedure-currently on Neo-Synephrine will change to Levophed  Cleviprex  ordered but not infusing at this time-to assist in meeting goal if patient becomes hypertensive Continue ventilator support and lung protective strategies  Continue LTVV  Wean PEEP and Fio2 requirements to sat goal of >92%  HOB > 30 degrees Plat < 30  Aim for Driving pressures < 15  Intermittent Chest X-ray and ABGS VAP and PAD protocols in place  Wean sedation as tolerated, SBT and WUA daily -plan for extubation in morning of 1/24, continue propofol  for now, wean down as tolerated BD- order yulperi, brovana , pulmicort  nebs  Order CPAP once extubated  Fall/delirium precautions  Aspiration precautions   Hypertension P: SBP goal  120-160, currently on Neo-Synephrine, hold all antihypertensives at this time  Prediabetes On metformin   Morbid Obesity- on tirzepatide  weekly injections  P: Follow-up with hemoglobin A1c Hold metformin   CBG q4 SSI  CBG goal 140-180   Right Breast Cancer Hx (2022) Estrogen Receptive Positive  On tamoxifen  P: Restart tamoxifen  when if passes swallow evaluation   Lymphedema P: Currently stable, continue to monitor Compression stockings  Anxiety P: Supportive care    Labs   CBC: No results for input(s): WBC, NEUTROABS, HGB, HCT, MCV, PLT in the last 168 hours.  Basic Metabolic  Panel: Recent Labs  Lab 09/03/24 2256  NA 135  K 3.7  CL 95*  CO2 23  GLUCOSE 118*  BUN 27*  CREATININE 0.90  CALCIUM  9.1   GFR: Estimated Creatinine Clearance: 58.7 mL/min (by C-G formula based on SCr of 0.9 mg/dL). No results for input(s): PROCALCITON, WBC, LATICACIDVEN in the last 168 hours.  Liver Function Tests: Recent Labs  Lab 09/03/24 2256  AST 39  ALT 25  ALKPHOS 71  BILITOT 0.4  PROT 6.6  ALBUMIN  3.3*   No results for input(s): LIPASE, AMYLASE in the last 168 hours. No results for input(s): AMMONIA in the last 168 hours.  ABG    Component Value Date/Time   TCO2 30 10/22/2022 1045     Coagulation Profile: Recent Labs  Lab 09/03/24 2256  INR 1.1    Cardiac Enzymes: No results for input(s): CKTOTAL, CKMB, CKMBINDEX, TROPONINI in the last 168 hours.  HbA1C: Hemoglobin A1C  Date/Time Value Ref Range Status  08/23/2022 12:00 AM 5.8  Final   Hgb A1c MFr Bld  Date/Time Value Ref Range Status  06/17/2023 11:05 AM 6.1 (H) 4.8 - 5.6 % Final    Comment:             Prediabetes: 5.7 - 6.4          Diabetes: >6.4          Glycemic control for adults with diabetes: <7.0   02/20/2022 09:42 AM 5.9 (H) 4.8 - 5.6 % Final    Comment:             Prediabetes: 5.7 - 6.4          Diabetes: >6.4          Glycemic control for adults with diabetes: <7.0     CBG: Recent Labs  Lab 09/03/24 1914 09/04/24 0000  GLUCAP 132* 139*    Review of Systems:   See HPI   Past Medical History:  She,  has a past medical history of Anemia, Anxiety, Arthritis, Back pain, Breast cancer (HCC), Cancer (HCC), Chest pain, Chicken pox, Chronic kidney disease, Constipation, Emphysema of lung (HCC), Family history of breast cancer (02/14/2021), Glaucoma, Hypertension, Joint pain, Lower extremity edema, Lymphedema, Osteoarthritis, Personal history of malignant neoplasm of uterus (02/14/2021), Pre-diabetes, Prediabetes, Shortness of breath, Sleep apnea, Urine  incontinence, Uterine cancer (HCC), UTI (urinary tract infection), Vertigo, and Vitamin B 12 deficiency.   Surgical History:   Past Surgical History:  Procedure Laterality Date   ABDOMINAL HYSTERECTOMY  2011   BOTOX  INJECTION N/A 10/22/2022   Procedure: BOTOX  INJECTION 100 UNITS;  Surgeon: Elisabeth Valli BIRCH, MD;  Location: [facility];  Service: Urology;  Laterality: N/A;   BREAST LUMPECTOMY WITH RADIOACTIVE SEED LOCALIZATION Right 02/20/2021   Procedure: RIGHT BREAST LUMPECTOMY WITH RADIOACTIVE SEED LOCALIZATION;  Surgeon: Aron Shoulders, MD;  Location: [facility] OR;  Service: General;  Laterality: Right;   CYSTOSCOPY WITH INJECTION N/A 10/22/2022   Procedure: CYSTOSCOPY WITH INJECTION OF LUEVENIA;  Surgeon: Elisabeth Valli BIRCH, MD;  Location: [facility];  Service: Urology;  Laterality: N/A;   IR ANGIOGRAM EXTREMITY RIGHT  03/18/2023   IR ANGIOGRAM SELECTIVE EACH ADDITIONAL VESSEL  03/18/2023   IR ANGIOGRAM SELECTIVE EACH ADDITIONAL VESSEL  03/18/2023   IR EMBO ARTERIAL NOT HEMORR HEMANG INC GUIDE ROADMAPPING  03/18/2023   IR RADIOLOGIST EVAL & MGMT  02/05/2023   IR RADIOLOGIST EVAL & MGMT  04/18/2023   IR RADIOLOGIST EVAL & MGMT  12/29/2023   IR US  GUIDE VASC ACCESS RIGHT  03/18/2023   REPLACEMENT TOTAL KNEE Right 2012     Social History:   reports that she quit smoking about 42 years ago. Her smoking use included cigarettes. She has never been exposed to tobacco smoke. She has never used smokeless tobacco. She reports current alcohol use of about 1.0 standard drink of alcohol per week. She reports that she does not use drugs.   Family History:  Her family history includes Alcohol abuse in her brother, brother, father, and paternal grandfather; Arthritis in her brother, maternal grandmother, and paternal grandfather; Asthma in her father; Breast cancer (age of onset: 8) in her paternal aunt; COPD in her father; Cancer in her brother, father, and mother; Cirrhosis in her paternal  grandfather; Colon cancer in her maternal grandfather; Colon polyps in her daughter; Depression in her daughter and son; Diabetes in her maternal grandmother and mother; Drug abuse in her brother; Heart attack in her father and mother; Heart disease in her father and mother; Hyperlipidemia in her maternal grandmother and mother; Hypertension in her daughter and  mother; Melanoma in her father and mother; Mental illness in her daughter; Miscarriages / Stillbirths in her mother; Obesity in her father and mother; Stroke in her mother; Thyroid  disease in her mother.   Allergies Allergies[1]   Home Medications  Prior to Admission medications  Medication Sig Start Date End Date Taking? Authorizing Provider  acetaminophen  (TYLENOL ) 650 MG CR tablet Take 650 mg by mouth every 8 (eight) hours as needed for pain. Takes usually once a day    [provider]  alendronate  (FOSAMAX ) 70 MG tablet TAKE 1 TABLET (70 MG TOTAL) BY MOUTH EVERY 7 (SEVEN) DAYS. TAKE WITH A FULL GLASS OF WATER ON AN EMPTY STOMACH. 04/04/22   Berneta Elsie Sayre, MD  chlorthalidone  (HYGROTON ) 25 MG tablet Take 1 tablet (25 mg total) by mouth daily. 03/15/22   Berneta Elsie Sayre, MD  diclofenac Sodium (VOLTAREN) 1 % GEL Apply 2 g topically daily as needed (Osteoarthritis).    [provider]  fluticasone  (FLONASE ) 50 MCG/ACT nasal spray Place 2 sprays into both nostrils in the morning and at bedtime. 04/17/23   Verdon Parry D, MD  furosemide  (LASIX ) 20 MG tablet Take 1 tablet (20 mg total) by mouth daily. 05/06/24   Emelia Josefa HERO, NP  Melatonin 10 MG TABS Take 10 mg by mouth at bedtime as needed (sleep).    [provider]  metFORMIN  (GLUCOPHAGE ) 500 MG tablet Take 1 tablet (500 mg total) by mouth 2 (two) times daily with a meal. 07/21/24   Verdon, Caren D, MD  metroNIDAZOLE  (METROGEL ) 1 % gel Apply topically daily. Patient taking differently: Apply 1 application  topically daily as needed (rosacea). 01/29/21    Berneta Elsie Sayre, MD  Multiple Vitamin (MULTIVITAMIN) tablet Take 1 tablet by mouth daily.    [provider]  tamoxifen  (NOLVADEX ) 20 MG tablet Take 1 tablet (20 mg total) by mouth daily. 08/13/24   Iruku, Praveena, MD  tirzepatide  5 MG/0.5ML injection vial Inject 5 mg into the skin once a week. 07/21/24   Verdon Parry D, MD  tiZANidine  (ZANAFLEX ) 2 MG tablet TAKE 1-2 TABLETS (2-4 MG TOTAL) BY MOUTH EVERY 8 (EIGHT) HOURS AS NEEDED. 06/26/23   Joane Artist RAMAN, MD  traZODone  (DESYREL ) 50 MG tablet Take 50 mg by mouth at bedtime as needed. 11/03/23   [provider]  DOMINIC BECK 100-62.5-25 MCG/ACT AEPB  11/10/23   [provider]     Critical care time: 65 mins     Christian Teryn Boerema AGACNP-BC   [facility] 09/04/2024, 1:06 AM  Please see [email or web address] for pager details.  From 7A-7P if no response, please call [phone]. After hours, please call ELink [phone].           [1]  Allergies Allergen Reactions   Amoxicillin Nausea And Vomiting   Percocet [Oxycodone -Acetaminophen ] Other (See Comments)    hallucinations   "

## 2024-09-04 NOTE — Progress Notes (Signed)
" °  Echocardiogram 2D Echocardiogram has been performed.  Liliyana Thobe 09/04/2024, 9:41 AM "

## 2024-09-04 NOTE — Procedures (Signed)
 Extubation Procedure Note  Patient Details:   Name: Kristi Hunter DOB: 1941/06/16 MRN: [id number]   Airway Documentation:    Vent end date: 09/04/24 Vent end time: 1643   Evaluation  O2 sats: stable throughout Complications: No apparent complications Patient did tolerate procedure well. Bilateral Breath Sounds: Clear   Yes  Pt extubated per MD order, placed on 3L [place]. Positive cuff leak noted, no stridor post extubation. RT will continue to monitor.   Duwaine Charles 09/04/2024, 4:46 PM

## 2024-09-04 NOTE — Consult Note (Signed)
 "  NAME:  Kristi Hunter, MRN:  [id number], DOB:  1941/01/10, LOS: 1 ADMISSION DATE:  09/03/2024, CONSULTATION DATE: 1/24 REFERRING MD: 1/24 CHIEF COMPLAINT: CVA  History of Present Illness:  Pt is a 84 year old female with significant past medical history of HTN, lymphedema, prediabetes,, breast cancer hx on tamoxifen , morbid obesity, Obstructive Sleep Apnea, COPD and anxiety who presents to [facility], ED for fall and right sided weakness by EMS from [facility] facility.  Last known well was approximately 1800, patient had a witnessed fall and did hit her head.  Per rapid response note code stroke was called at 1847 for left-sided gaze, dysarthria, right-sided weakness.  CT of head was negative hemorrhage, negative for acute intracranial abnormality, CT of cervical spine-negative for acute changes, CT angio head and neck noted an acute occlusion of the proximal M1 segment of the left MCA, and no significant stenosis in cervical carotid/vertebral arteries.  Patient was ultimately given TNK at 1934, and interventional radiology was activated for code stroke in which a left M1 thrombectomy was performed. Patient remained intubated after procedure.  PCCM consulted to assist with critical care and vent management while residing in ICU.   Pertinent  Medical History   Past Medical History:  Diagnosis Date   Anemia    Anxiety    Arthritis    several different parts of the body per pt 10/17/2022   Back pain    Breast cancer (HCC)    02/20/21   Cancer (HCC)    uterine   Chest pain    Chicken pox    Chronic kidney disease    Constipation    Emphysema of lung (HCC)    Family history of breast cancer 02/14/2021   Glaucoma    Hypertension    Joint pain    Lower extremity edema    Lymphedema    Osteoarthritis    Personal history of malignant neoplasm of uterus 02/14/2021   Pre-diabetes    Prediabetes    Shortness of breath    Sleep apnea    doesn't use CPAP machine anymore   Urine  incontinence    Uterine cancer (HCC)    2011   UTI (urinary tract infection)    Vertigo    had vertigo last week. 10/17/2022   Vitamin B 12 deficiency    during pregnancy     Significant Hospital Events: Including procedures, antibiotic start and stop dates in addition to other pertinent events   1/23-code stroke,  acute occlusion of the proximal M1 segment of the left MCA> IR s/p left M1 thrombectomy  Interim History / Subjective:  Patient was on sedation and.  Following commands.  Had to be placed back on sedation to get an MRI while she was still intubated. Was tolerating pressure support trial.  However was tachypneic due to agitation.  Objective    Blood pressure 137/60, pulse (!) 48, temperature (!) 97.5 F (36.4 C), temperature source Axillary, resp. rate 16, weight 111 kg, SpO2 100%.    Vent Mode: PRVC FiO2 (%):  [40 %] 40 % Set Rate:  [16 bmp] 16 bmp Vt Set:  [500 mL] 500 mL PEEP:  [5 cmH20] 5 cmH20 Pressure Support:  [5 cmH20-10 cmH20] 5 cmH20 Plateau Pressure:  [16 cmH20] 16 cmH20   Intake/Output Summary (Last 24 hours) at 09/04/2024 1249 Last data filed at 09/04/2024 1200 Gross per 24 hour  Intake 1840.55 ml  Output 700 ml  Net 1140.55 ml   Filed Weights   09/03/24  1918  Weight: 111 kg    Examination: General: Elderly female who is currently intubated.  She is agitated. Lungs: clear to auscultation bilaterally.  Heart: regular rate rhythm, no murmur appreciated.  Abdomen: non tender, non distended. Normal BS.  Neuro: Alert and following commands.  Able to move all extremities.   Resolved problem list   Assessment and Plan  Acute CVA of M1 segment of left MCA s/p TNK and left M1 thrombectomy LKW- 1800- 1/23, TNK given at 1934  CT of head/C-spine-negative, CT head/neck angio-acute occlusion of the proximal M1 segment of the left MCA> IR s/p left M1 thrombectomy Acute respiratory insufficiency/postop-intubation in setting of above History of  Obstructive sleep apnea- noncompliance with CPAP COPD- 01/01/2024 noting ratio 82% FEV1 62% FVC 75% DLCO 41% - on trelegy  Follows up with [facility], no smoking hx  P: MRI: Done was read pending. Echo reviewed: Normal EF.  No changes compared to before. SBP goal 120-160 for first 24 hours.  Currently on Levophed  to maintain this. Cleviprex  if needed. Currently on pressure support trial.  Will plan on extubation today.  LT VV if not extubated. Vent bundle. BD- order yulperi, brovana , pulmicort  nebs  Order CPAP once extubated for OSA management.  Hypertension P: SBP goal as above.  Prediabetes On metformin   Morbid Obesity- on tirzepatide  weekly injections  P: Hold metformin   CBG q4 SSI  CBG goal 140-180   Right Breast Cancer Hx (2022) Estrogen Receptive Positive  Hold tamoxifen  currently.  Will need to discuss this with stroke team as it increases risk for strokes.  Lymphedema P: Currently stable, continue to monitor Compression stockings  Anxiety P: Supportive care  Continue ICU level of care.  Will plan on extubation today.  Labs   CBC: Recent Labs  Lab 09/04/24 0601  HGB 10.5*  HCT 31.0*    Basic Metabolic Panel: Recent Labs  Lab 09/03/24 2256 09/04/24 0601  NA 135 137  K 3.7 2.6*  CL 95*  --   CO2 23  --   GLUCOSE 118*  --   BUN 27*  --   CREATININE 0.90  --   CALCIUM  9.1  --    GFR: Estimated Creatinine Clearance: 58.7 mL/min (by C-G formula based on SCr of 0.9 mg/dL). No results for input(s): PROCALCITON, WBC, LATICACIDVEN in the last 168 hours.  Liver Function Tests: Recent Labs  Lab 09/03/24 2256  AST 39  ALT 25  ALKPHOS 71  BILITOT 0.4  PROT 6.6  ALBUMIN  3.3*   No results for input(s): LIPASE, AMYLASE in the last 168 hours. No results for input(s): AMMONIA in the last 168 hours.  ABG    Component Value Date/Time   PHART 7.408 09/04/2024 0601   PCO2ART 44.5 09/04/2024 0601   PO2ART 172 (H)  09/04/2024 0601   HCO3 28.3 (H) 09/04/2024 0601   TCO2 30 09/04/2024 0601   O2SAT 100 09/04/2024 0601     Coagulation Profile: Recent Labs  Lab 09/03/24 2256  INR 1.1    Cardiac Enzymes: No results for input(s): CKTOTAL, CKMB, CKMBINDEX, TROPONINI in the last 168 hours.  HbA1C: Hemoglobin A1C  Date/Time Value Ref Range Status  08/23/2022 12:00 AM 5.8  Final   Hgb A1c MFr Bld  Date/Time Value Ref Range Status  06/17/2023 11:05 AM 6.1 (H) 4.8 - 5.6 % Final    Comment:             Prediabetes: 5.7 - 6.4  Diabetes: >6.4          Glycemic control for adults with diabetes: <7.0   02/20/2022 09:42 AM 5.9 (H) 4.8 - 5.6 % Final    Comment:             Prediabetes: 5.7 - 6.4          Diabetes: >6.4          Glycemic control for adults with diabetes: <7.0     CBG: Recent Labs  Lab 09/03/24 1914 09/04/24 0000 09/04/24 0336 09/04/24 0726  GLUCAP 132* 139* 151* 135*    Review of Systems:   See HPI   Past Medical History:  She,  has a past medical history of Anemia, Anxiety, Arthritis, Back pain, Breast cancer (HCC), Cancer (HCC), Chest pain, Chicken pox, Chronic kidney disease, Constipation, Emphysema of lung (HCC), Family history of breast cancer (02/14/2021), Glaucoma, Hypertension, Joint pain, Lower extremity edema, Lymphedema, Osteoarthritis, Personal history of malignant neoplasm of uterus (02/14/2021), Pre-diabetes, Prediabetes, Shortness of breath, Sleep apnea, Urine incontinence, Uterine cancer (HCC), UTI (urinary tract infection), Vertigo, and Vitamin B 12 deficiency.   Surgical History:   Past Surgical History:  Procedure Laterality Date   ABDOMINAL HYSTERECTOMY  2011   BOTOX  INJECTION N/A 10/22/2022   Procedure: BOTOX  INJECTION 100 UNITS;  Surgeon: Elisabeth Valli BIRCH, MD;  Location: [facility];  Service: Urology;  Laterality: N/A;   BREAST LUMPECTOMY WITH RADIOACTIVE SEED LOCALIZATION Right 02/20/2021   Procedure: RIGHT BREAST  LUMPECTOMY WITH RADIOACTIVE SEED LOCALIZATION;  Surgeon: Aron Shoulders, MD;  Location: [facility] OR;  Service: General;  Laterality: Right;   CYSTOSCOPY WITH INJECTION N/A 10/22/2022   Procedure: CYSTOSCOPY WITH INJECTION OF LUEVENIA;  Surgeon: Elisabeth Valli BIRCH, MD;  Location: [facility];  Service: Urology;  Laterality: N/A;   IR ANGIOGRAM EXTREMITY RIGHT  03/18/2023   IR ANGIOGRAM SELECTIVE EACH ADDITIONAL VESSEL  03/18/2023   IR ANGIOGRAM SELECTIVE EACH ADDITIONAL VESSEL  03/18/2023   IR CT HEAD LTD  09/03/2024   IR EMBO ARTERIAL NOT HEMORR HEMANG INC GUIDE ROADMAPPING  03/18/2023   IR PERCUTANEOUS ART THROMBECTOMY/INFUSION INTRACRANIAL INC DIAG ANGIO  09/03/2024   IR RADIOLOGIST EVAL & MGMT  02/05/2023   IR RADIOLOGIST EVAL & MGMT  04/18/2023   IR RADIOLOGIST EVAL & MGMT  12/29/2023   IR US  GUIDE VASC ACCESS RIGHT  03/18/2023   IR US  GUIDE VASC ACCESS RIGHT  09/03/2024   REPLACEMENT TOTAL KNEE Right 2012     Social History:   reports that she quit smoking about 42 years ago. Her smoking use included cigarettes. She has never been exposed to tobacco smoke. She has never used smokeless tobacco. She reports current alcohol use of about 1.0 standard drink of alcohol per week. She reports that she does not use drugs.   Family History:  Her family history includes Alcohol abuse in her brother, brother, father, and paternal grandfather; Arthritis in her brother, maternal grandmother, and paternal grandfather; Asthma in her father; Breast cancer (age of onset: 80) in her paternal aunt; COPD in her father; Cancer in her brother, father, and mother; Cirrhosis in her paternal grandfather; Colon cancer in her maternal grandfather; Colon polyps in her daughter; Depression in her daughter and son; Diabetes in her maternal grandmother and mother; Drug abuse in her brother; Heart attack in her father and mother; Heart disease in her father and mother; Hyperlipidemia in her maternal grandmother and mother;  Hypertension in her daughter and mother; Melanoma in  her father and mother; Mental illness in her daughter; Miscarriages / Stillbirths in her mother; Obesity in her father and mother; Stroke in her mother; Thyroid  disease in her mother.   Allergies Allergies[1]   Home Medications  Prior to Admission medications  Medication Sig Start Date End Date Taking? Authorizing Provider  acetaminophen  (TYLENOL ) 650 MG CR tablet Take 650 mg by mouth every 8 (eight) hours as needed for pain. Takes usually once a day    [provider]  alendronate  (FOSAMAX ) 70 MG tablet TAKE 1 TABLET (70 MG TOTAL) BY MOUTH EVERY 7 (SEVEN) DAYS. TAKE WITH A FULL GLASS OF WATER ON AN EMPTY STOMACH. 04/04/22   Berneta Elsie Sayre, MD  chlorthalidone  (HYGROTON ) 25 MG tablet Take 1 tablet (25 mg total) by mouth daily. 03/15/22   Berneta Elsie Sayre, MD  diclofenac Sodium (VOLTAREN) 1 % GEL Apply 2 g topically daily as needed (Osteoarthritis).    [provider]  fluticasone  (FLONASE ) 50 MCG/ACT nasal spray Place 2 sprays into both nostrils in the morning and at bedtime. 04/17/23   Verdon Parry D, MD  furosemide  (LASIX ) 20 MG tablet Take 1 tablet (20 mg total) by mouth daily. 05/06/24   Emelia Josefa HERO, NP  Melatonin 10 MG TABS Take 10 mg by mouth at bedtime as needed (sleep).    [provider]  metFORMIN  (GLUCOPHAGE ) 500 MG tablet Take 1 tablet (500 mg total) by mouth 2 (two) times daily with a meal. 07/21/24   Verdon, Caren D, MD  metroNIDAZOLE  (METROGEL ) 1 % gel Apply topically daily. Patient taking differently: Apply 1 application  topically daily as needed (rosacea). 01/29/21   Berneta Elsie Sayre, MD  Multiple Vitamin (MULTIVITAMIN) tablet Take 1 tablet by mouth daily.    [provider]  tamoxifen  (NOLVADEX ) 20 MG tablet Take 1 tablet (20 mg total) by mouth daily. 08/13/24   Iruku, Praveena, MD  tirzepatide  5 MG/0.5ML injection vial Inject 5 mg into the skin once a week. 07/21/24    Verdon Parry D, MD  tiZANidine  (ZANAFLEX ) 2 MG tablet TAKE 1-2 TABLETS (2-4 MG TOTAL) BY MOUTH EVERY 8 (EIGHT) HOURS AS NEEDED. 06/26/23   Joane Artist RAMAN, MD  traZODone  (DESYREL ) 50 MG tablet Take 50 mg by mouth at bedtime as needed. 11/03/23   [provider]  DOMINIC BECK 100-62.5-25 MCG/ACT AEPB  11/10/23   [provider]    CRITICAL CARE Performed by: Sammi JONETTA Fredericks.     Total critical care time: 35 minutes   Critical care time was exclusive of separately billable procedures and treating other patients.   Critical care was necessary to treat or prevent imminent or life-threatening deterioration.   Critical care was time spent personally by me on the following activities: development of treatment plan with patient and/or surrogate as well as nursing, discussions with consultants, evaluation of patient's response to treatment, examination of patient, obtaining history from patient or surrogate, ordering and performing treatments and interventions, ordering and review of laboratory studies, ordering and review of radiographic studies, pulse oximetry, re-evaluation of patient's condition and participation in multidisciplinary rounds.  Sammi JONETTA Fredericks, MD Pulmonary, Critical Care and Sleep Attending.   09/04/2024, 12:55 PM       [1]  Allergies Allergen Reactions   Amoxicillin Nausea And Vomiting   Percocet [Oxycodone -Acetaminophen ] Other (See Comments)    hallucinations   "

## 2024-09-04 NOTE — Progress Notes (Signed)
 eLink Physician-Brief Progress Note Patient Name: Kristi Hunter DOB: Feb 10, 1941 MRN: [id number]   Date of Service  09/04/2024  HPI/Events of Note  An 84 yr old female with OSA, COPD, morbid obesity, HTN, and anxiety/depression, who presented with fall, right sided weakness, and dysarthria. Received NTK and then MT of left M1 thrombus. Intubated in cath and kept intubated post procedure.     eICU Interventions  Chart reviewed     Intervention Category Evaluation Type: New Patient Evaluation  CLAUDENE AGENT, P 09/04/2024, 1:42 AM

## 2024-09-04 NOTE — Progress Notes (Signed)
 PT Cancellation Note  Patient Details Name: Kristi Hunter MRN: [id number] DOB: 10/23/1940   Cancelled Treatment:    Reason Eval/Treat Not Completed: Active bedrest order. Pt with strict bedrest order after TNK. PT will follow up once the pt is off bedrest and appropriate to mobilize.   [name] 09/04/2024, 7:52 AM

## 2024-09-04 NOTE — Progress Notes (Signed)
 NIR Progress Note:  POD#1 s/p left MCA thrombectomy (TICI 2b)  Assessment and Plan: Acute ischemic stroke: Patient remains intubated and is planned for MRI.  Moving both extremities to command.  No evidence of access site complication. Routine postoperative care and continued evaluation for potential etiologies.  No access site limitations from VIR standpoint.  Please call with questions, concerns, or change in patient condition.  Overnight Events: Sedated, but following commands.  Subjective: Unable to obtain  Objective: Physical Exam: BP (!) 129/58   Pulse 80   Temp (!) 97.5 F (36.4 C) (Axillary)   Resp (!) 24   Wt 111 kg   SpO2 100%   BMI 43.35 kg/m   Intubated  Upper extremity bruising Access site C/D/I Distal extremity warm  Audible doppler signals in bilateral lower extremities  Current Medications[1]  Labs: CBC Recent Labs    09/04/24 0601  HGB 10.5*  HCT 31.0*   BMET Recent Labs    09/03/24 2256 09/04/24 0601  NA 135 137  K 3.7 2.6*  CL 95*  --   CO2 23  --   GLUCOSE 118*  --   BUN 27*  --   CREATININE 0.90  --   CALCIUM  9.1  --    LFT Recent Labs    09/03/24 2256  PROT 6.6  ALBUMIN  3.3*  AST 39  ALT 25  ALKPHOS 71  BILITOT 0.4   PT/INR Recent Labs    09/03/24 2256  LABPROT 14.4  INR 1.1    LOS: 1 day   I spent a total of 15 minutes in face to face in clinical consultation, greater than 50% of which was counseling/coordinating care.  Aubri Gathright 09/04/2024 9:20 AM       [1]  Current Facility-Administered Medications:    0.9 %  sodium chloride  infusion, , Intravenous, Continuous, Michaela Aisha SQUIBB, MD, Last Rate: 75 mL/hr at 09/04/24 0657, Infusion Verify at 09/04/24 0657   0.9 %  sodium chloride  infusion, 250 mL, Intravenous, Continuous, Claudene Fonda BROCKS, NP, Held at 09/04/24 9540   arformoterol  (BROVANA ) nebulizer solution 15 mcg, 15 mcg, Nebulization, BID, Smith, Joshua C, NP, 15 mcg at 09/04/24 9191   budesonide   (PULMICORT ) nebulizer solution 0.25 mg, 0.25 mg, Nebulization, BID, Smith, Joshua C, NP, 0.25 mg at 09/04/24 9189   Chlorhexidine  Gluconate Cloth 2 % PADS 6 each, 6 each, Topical, Q0600, Michaela Aisha SQUIBB, MD, 6 each at 09/04/24 0600   clevidipine  (CLEVIPREX ) infusion 0.5 mg/mL, 0-21 mg/hr, Intravenous, Continuous, Ray Coy, MD   dexmedetomidine  (PRECEDEX ) 400 MCG/100ML (4 mcg/mL) infusion, 0-1.2 mcg/kg/hr, Intravenous, Titrated, Pawar, Rahul, MD, Last Rate: 11.1 mL/hr at 09/04/24 0854, 0.4 mcg/kg/hr at 09/04/24 0854   fentaNYL  (SUBLIMAZE ) injection 25-50 mcg, 25-50 mcg, Intravenous, Q2H PRN, Smith, Joshua C, NP, 50 mcg at 09/04/24 0117   insulin  aspart (novoLOG ) injection 0-15 Units, 0-15 Units, Subcutaneous, Q4H, Michaela Aisha SQUIBB, MD, 3 Units at 09/04/24 0459   labetalol  (NORMODYNE ) injection 10 mg, 10 mg, Intravenous, Once PRN **AND** nicardipine  (CARDENE ) 20mg  in 0.86% saline IV infusion (0.1 mg/ml), 0-15 mg/hr, Intravenous, Continuous PRN, Michaela Aisha SQUIBB, MD   norepinephrine  (LEVOPHED ) 4mg  in (0.016 mg/mL) premix infusion, 0-10 mcg/min, Intravenous, Titrated, Claudene Fonda C, NP, Last Rate: 15 mL/hr at 09/04/24 0657, 4 mcg/min at 09/04/24 0657   Oral care mouth rinse, 15 mL, Mouth Rinse, Q2H, Michaela Aisha SQUIBB, MD, 15 mL at 09/04/24 0600   Oral care mouth rinse, 15 mL, Mouth Rinse, PRN, Michaela Aisha  P, MD   pantoprazole  (PROTONIX ) injection 40 mg, 40 mg, Intravenous, QHS, Michaela Aisha SQUIBB, MD, 40 mg at 09/03/24 2310   propofol  (DIPRIVAN ) 1000 MG/100ML infusion, 0-80 mcg/kg/min, Intravenous, Titrated, Michaela Aisha SQUIBB, MD, Last Rate: 16.65 mL/hr at 09/04/24 0657, 25 mcg/kg/min at 09/04/24 0657   revefenacin  (YUPELRI ) nebulizer solution 175 mcg, 175 mcg, Nebulization, Daily, Smith, Joshua C, NP, 175 mcg at 09/04/24 9191   sodium chloride  0.9 % bolus 250 mL, 250 mL, Intravenous, PRN, Ray Coy, MD   tamoxifen  (NOLVADEX ) tablet 20 mg, 20  mg, Oral, Daily, Michaela Aisha SQUIBB, MD

## 2024-09-04 NOTE — Progress Notes (Addendum)
 STROKE TEAM PROGRESS NOTE   INTERIM HISTORY/SUBJECTIVE Granddaughter and daughter are at the bedside.  Patient remains intubated on 4 mcg of Levophed , 40 mcg propofol  and Precedex  at 0.6 mcg Patient received IV TNK and thrombectomy for acute onset of right-sided weakness and left M1 occlusion MRI brain acute infarct in left basal ganglia and punctate infarct in the left parietal lobe 24-hour brain imaging around 1930 tonight  Daughter at the bedside states that there is a family history of blood clots in the family, does not believe her mom has had a blood clot in her life will check lower extremity Dopplers Patient remains intubated but is arousable and follows commands and is even moving the right side. CBC    Component Value Date/Time   WBC 7.5 03/18/2023 0845   RBC 4.29 03/18/2023 0845   HGB 10.5 (L) 09/04/2024 0601   HGB 12.6 02/20/2022 0942   HCT 31.0 (L) 09/04/2024 0601   HCT 39.6 02/20/2022 0942   PLT 279 03/18/2023 0845   PLT 318 02/20/2022 0942   MCV 88.8 03/18/2023 0845   MCV 87 02/20/2022 0942   MCH 28.2 03/18/2023 0845   MCHC 31.8 03/18/2023 0845   RDW 13.4 03/18/2023 0845   RDW 13.0 02/20/2022 0942   LYMPHSABS 1.9 03/18/2023 0845   LYMPHSABS 2.3 02/20/2022 0942   MONOABS 0.6 03/18/2023 0845   EOSABS 0.1 03/18/2023 0845   EOSABS 0.2 02/20/2022 0942   BASOSABS 0.0 03/18/2023 0845   BASOSABS 0.1 02/20/2022 0942    BMET    Component Value Date/Time   NA 137 09/04/2024 0601   NA 144 06/17/2023 1105   K 2.6 (LL) 09/04/2024 0601   CL 95 (L) 09/03/2024 2256   CO2 23 09/03/2024 2256   GLUCOSE 118 (H) 09/03/2024 2256   BUN 27 (H) 09/03/2024 2256   BUN 18 06/17/2023 1105   CREATININE 0.90 09/03/2024 2256   CREATININE 0.76 12/11/2021 1226   CALCIUM  9.1 09/03/2024 2256   EGFR 91 06/17/2023 1105   GFRNONAA >60 09/03/2024 2256   GFRNONAA >60 12/11/2021 1226    IMAGING past 24 hours DG Hand 2 View Right Result Date: 09/04/2024 CLINICAL DATA:  Stroke.  Swelling  and bruising. EXAM: RIGHT HAND - 2 VIEW COMPARISON:  None Available. FINDINGS: Acute mildly displaced distal fifth metacarpal shaft fracture. No intra-articular involvement. The bones are subjectively under mineralized. Osteoarthritis involving the digits and thumb carpal metacarpal joint. Generalized soft tissue edema, prominent over the dorsum of the hand. No radiopaque foreign body or soft tissue gas. IMPRESSION: 1. Acute mildly displaced distal fifth metacarpal shaft fracture. 2. Generalized soft tissue edema, prominent over the dorsum of the hand. Electronically Signed   By: Andrea Gasman M.D.   On: 09/04/2024 10:58   IR PERCUTANEOUS ART THROMBECTOMY/INFUSION INTRACRANIAL INC DIAG ANGIO Result Date: 09/04/2024 PROCEDURE PERFORMED: 1. Cerebral angiography with stroke thrombectomy 2. Ultrasound guided vascular access 3. Cone beam CT for treatment planning COMPARISON:  CT angiogram of the head and neck performed September 03, 2024 CLINICAL DATA:  84 year old female with acute ischemic stroke with symptoms driven by right-sided weakness. NIH stroke scale was estimated at 16. The patient was evaluated in a multidisciplinary fashion and by consensus was brought to interventional radiology for mechanical thrombectomy. INDICATION: Acute ischemic stroke. ANESTHESIA/SEDATION: General anesthesia was utilized for the procedure. CONTRAST:  Approximately 35 cc Ominipque 300 MEDICATIONS: See MAR FLUOROSCOPY TIME:  Fluoroscopy Time: 5 minutes, (536 mGy). COMPLICATIONS: None immediate. BODY OF REPORT: Following a full explanation of  the procedure along with the potential associated complications, an informed witnessed consent was obtained. The patient was then placed under general anesthesia by the Department of Anesthesiology at [facility]. The right femoral access site was prepped and draped in the usual sterile fashion. Ultrasound was used to study the right common femoral artery which was patent. Using  real-time ultrasound guidance, a 19 gauge introducer needle was used to access the right common femoral artery. Access was performed at 2017. A hard copy image ultrasound the saved and stored in PACS. Using this access, a 6 French sheath was placed in the descending thoracic aorta. Next, selective catheterization the left internal carotid artery was performed. A selective arteriogram was performed which demonstrated occlusion of the distal left M1 segment. Pretreatment TICI score 0. Next, red 72 catheter was used to selectively catheterize the distal left M1 segment. The first pass was performed at 2032. A repeat arteriogram with catheter positioned in the left internal carotid artery demonstrated successful extraction of the left M1 embolus. A small frontal branch embolus was observed on the frontal and lateral views which was nonocclusive and too small for subsequent retrieval. The main truncal branches of the left MCA territory were patent. The vast majority of the left MCA territory was reperfused. There was no evidence of active arterial extravasation or dissection. Post treatment TICI score 2b. After reviewing the imaging, I elected to terminate the procedure at this point. Evaluation of the right femoral access site demonstrated that the site was suitable for a closure device. A 6 French Angio-Seal device was deployed without complication. Cone beam CT was then performed to evaluate for intracranial hemorrhage and treatment planning. This demonstrated no evidence of significant acute intracranial hemorrhage. The patient remained intubated secondary to anesthesia concerns. The patient was transferred to the ICU without evidence of immediate complication. IMPRESSION: 1. Suction thrombectomy of left M1 occlusion. PLAN: 1. To ICU for routine postoperative supportive care. Electronically Signed   By: Maude Naegeli M.D.   On: 09/04/2024 08:46   IR CT Head Ltd Result Date: 09/04/2024 PROCEDURE PERFORMED: 1.  Cerebral angiography with stroke thrombectomy 2. Ultrasound guided vascular access 3. Cone beam CT for treatment planning COMPARISON:  CT angiogram of the head and neck performed September 03, 2024 CLINICAL DATA:  84 year old female with acute ischemic stroke with symptoms driven by right-sided weakness. NIH stroke scale was estimated at 16. The patient was evaluated in a multidisciplinary fashion and by consensus was brought to interventional radiology for mechanical thrombectomy. INDICATION: Acute ischemic stroke. ANESTHESIA/SEDATION: General anesthesia was utilized for the procedure. CONTRAST:  Approximately 35 cc Ominipque 300 MEDICATIONS: See MAR FLUOROSCOPY TIME:  Fluoroscopy Time: 5 minutes, (536 mGy). COMPLICATIONS: None immediate. BODY OF REPORT: Following a full explanation of the procedure along with the potential associated complications, an informed witnessed consent was obtained. The patient was then placed under general anesthesia by the Department of Anesthesiology at [facility]. The right femoral access site was prepped and draped in the usual sterile fashion. Ultrasound was used to study the right common femoral artery which was patent. Using real-time ultrasound guidance, a 19 gauge introducer needle was used to access the right common femoral artery. Access was performed at 2017. A hard copy image ultrasound the saved and stored in PACS. Using this access, a 6 French sheath was placed in the descending thoracic aorta. Next, selective catheterization the left internal carotid artery was performed. A selective arteriogram was performed which demonstrated occlusion of the distal  left M1 segment. Pretreatment TICI score 0. Next, red 72 catheter was used to selectively catheterize the distal left M1 segment. The first pass was performed at 2032. A repeat arteriogram with catheter positioned in the left internal carotid artery demonstrated successful extraction of the left M1 embolus. A small  frontal branch embolus was observed on the frontal and lateral views which was nonocclusive and too small for subsequent retrieval. The main truncal branches of the left MCA territory were patent. The vast majority of the left MCA territory was reperfused. There was no evidence of active arterial extravasation or dissection. Post treatment TICI score 2b. After reviewing the imaging, I elected to terminate the procedure at this point. Evaluation of the right femoral access site demonstrated that the site was suitable for a closure device. A 6 French Angio-Seal device was deployed without complication. Cone beam CT was then performed to evaluate for intracranial hemorrhage and treatment planning. This demonstrated no evidence of significant acute intracranial hemorrhage. The patient remained intubated secondary to anesthesia concerns. The patient was transferred to the ICU without evidence of immediate complication. IMPRESSION: 1. Suction thrombectomy of left M1 occlusion. PLAN: 1. To ICU for routine postoperative supportive care. Electronically Signed   By: Maude Naegeli M.D.   On: 09/04/2024 08:46   IR US  Guide Vasc Access Right Result Date: 09/04/2024 PROCEDURE PERFORMED: 1. Cerebral angiography with stroke thrombectomy 2. Ultrasound guided vascular access 3. Cone beam CT for treatment planning COMPARISON:  CT angiogram of the head and neck performed September 03, 2024 CLINICAL DATA:  84 year old female with acute ischemic stroke with symptoms driven by right-sided weakness. NIH stroke scale was estimated at 16. The patient was evaluated in a multidisciplinary fashion and by consensus was brought to interventional radiology for mechanical thrombectomy. INDICATION: Acute ischemic stroke. ANESTHESIA/SEDATION: General anesthesia was utilized for the procedure. CONTRAST:  Approximately 35 cc Ominipque 300 MEDICATIONS: See MAR FLUOROSCOPY TIME:  Fluoroscopy Time: 5 minutes, (536 mGy). COMPLICATIONS: None immediate. BODY  OF REPORT: Following a full explanation of the procedure along with the potential associated complications, an informed witnessed consent was obtained. The patient was then placed under general anesthesia by the Department of Anesthesiology at [facility]. The right femoral access site was prepped and draped in the usual sterile fashion. Ultrasound was used to study the right common femoral artery which was patent. Using real-time ultrasound guidance, a 19 gauge introducer needle was used to access the right common femoral artery. Access was performed at 2017. A hard copy image ultrasound the saved and stored in PACS. Using this access, a 6 French sheath was placed in the descending thoracic aorta. Next, selective catheterization the left internal carotid artery was performed. A selective arteriogram was performed which demonstrated occlusion of the distal left M1 segment. Pretreatment TICI score 0. Next, red 72 catheter was used to selectively catheterize the distal left M1 segment. The first pass was performed at 2032. A repeat arteriogram with catheter positioned in the left internal carotid artery demonstrated successful extraction of the left M1 embolus. A small frontal branch embolus was observed on the frontal and lateral views which was nonocclusive and too small for subsequent retrieval. The main truncal branches of the left MCA territory were patent. The vast majority of the left MCA territory was reperfused. There was no evidence of active arterial extravasation or dissection. Post treatment TICI score 2b. After reviewing the imaging, I elected to terminate the procedure at this point. Evaluation of the right femoral access  site demonstrated that the site was suitable for a closure device. A 6 French Angio-Seal device was deployed without complication. Cone beam CT was then performed to evaluate for intracranial hemorrhage and treatment planning. This demonstrated no evidence of significant acute  intracranial hemorrhage. The patient remained intubated secondary to anesthesia concerns. The patient was transferred to the ICU without evidence of immediate complication. IMPRESSION: 1. Suction thrombectomy of left M1 occlusion. PLAN: 1. To ICU for routine postoperative supportive care. Electronically Signed   By: Maude Naegeli M.D.   On: 09/04/2024 08:46   CT ANGIO HEAD NECK W WO CM (CODE STROKE) Result Date: 09/03/2024 EXAM: CTA HEAD AND NECK WITH AND WITHOUT 09/03/2024 07:50:46 PM TECHNIQUE: CTA of the head and neck was performed with and without the administration of 75 mL iohexol  (OMNIPAQUE ) 350 MG/ML injection. Multiplanar 2D and/or 3D reformatted images are provided for review. Automated exposure control, iterative reconstruction, and/or weight based adjustment of the mA/kV was utilized to reduce the radiation dose to as low as reasonably achievable. Stenosis of the internal carotid arteries measured using NASCET criteria. COMPARISON: Same day CT head CLINICAL HISTORY: Neuro deficit, acute, stroke suspected. FINDINGS: CTA NECK: AORTIC ARCH AND ARCH VESSELS: Mild to moderate atherosclerosis of the visualized aortic arch. Atherosclerosis at the left subclavian artery origin without stenosis. No dissection or arterial injury. CERVICAL CAROTID ARTERIES: Atherosclerosis at the right carotid bifurcation extending into the right carotid bulb without hemodynamically significant stenosis. Mild atherosclerosis at the left carotid bifurcation without hemodynamically significant stenosis. Mild tortuosity of the distal left cervical ICA. No dissection or arterial injury. CERVICAL VERTEBRAL ARTERIES: Motion artifact limits evaluation of the V1 sequence of the vertebral arteries. Within these limitations, the vertebral arteries are patent to the vertebrobasilar confluence. No dissection, arterial injury, or significant stenosis. LUNGS AND MEDIASTINUM: Unremarkable. SOFT TISSUES: No acute abnormality. BONES: There are  mild degenerative changes in the visualized spine. No acute abnormality. CTA HEAD: ANTERIOR CIRCULATION: Atherosclerosis of the carotid siphons without significant stenosis. There is abrupt cutoff of the proximal M1 segment of the left MCA compatible with acute occlusion. There is contrast filling of a few distal left MCA branches likely by mild collaterals. The right MCA is patent. Bilateral ACAs are patent. No aneurysm. POSTERIOR CIRCULATION: Fetal origin of the left PCA. No significant stenosis of the posterior cerebral arteries. No significant stenosis of the basilar artery. No significant stenosis of the vertebral arteries. No aneurysm. OTHER: No dural venous sinus thrombosis on this non-dedicated study. IMPRESSION: 1. Acute occlusion of the proximal M1 segment of the left MCA. There is contrast filling of a few distal left MCA branches, likely via mild collaterals. 2. No hemodynamically significant stenosis in the cervical carotid or vertebral arteries. 3. Impression #1 messaged to Dr. Michaela via the [facility] messaging system at 7:53 PM on 09/03/24. Electronically signed by: Donnice Mania MD 09/03/2024 08:04 PM EST RP Workstation: [id number]   CT C-SPINE NO CHARGE Result Date: 09/03/2024 EXAM: CT CERVICAL SPINE WITHOUT CONTRAST 09/03/2024 07:50:46 PM TECHNIQUE: CT of the cervical spine was performed without the administration of intravenous contrast. Multiplanar reformatted images are provided for review. Automated exposure control, iterative reconstruction, and/or weight based adjustment of the mA/kV was utilized to reduce the radiation dose to as low as reasonably achievable. COMPARISON: None available. CLINICAL HISTORY: (Patient age 12y, gender F) FINDINGS: BONES AND ALIGNMENT: No acute fracture or traumatic malalignment. DEGENERATIVE CHANGES: Multilevel mild-to-moderate degenerative changes of the spine. No associated severe osseous or foraminal or central canal stenosis.  SOFT TISSUES: No prevertebral  soft tissue swelling. VASCULATURE: Atherosclerotic plaque of the aortic arch and its main branches. IMPRESSION: 1. No acute findings. Electronically signed by: Morgane Naveau MD 09/03/2024 07:58 PM EST RP Workstation: [id number]   CT HEAD CODE STROKE WO CONTRAST Result Date: 09/03/2024 EXAM: CT HEAD WITHOUT CONTRAST 09/03/2024 07:25:43 PM TECHNIQUE: CT of the head was performed without the administration of intravenous contrast. Automated exposure control, iterative reconstruction, and/or weight based adjustment of the mA/kV was utilized to reduce the radiation dose to as low as reasonably achievable. COMPARISON: None available. CLINICAL HISTORY: Acute neurological deficit, stroke suspected. FINDINGS: BRAIN AND VENTRICLES: No acute hemorrhage. No evidence of acute infarct. No hydrocephalus. No extra-axial collection. No mass effect or midline shift. Streak artifact limiting evaluation of posterior fossa. Mild chronic microvascular ischemic changes. Atherosclerosis of the carotid siphons. [place] stroke program early CT (ASPECT) score: Ganglionic (caudate, IC, lentiform nucleus, insula, M1-M3): 7 Supraganglionic (M4-M6): 3 Total: 10 ORBITS: Bilateral lens replacement. SINUSES: Mucosal thickening within left sphenoid sinus. SOFT TISSUES AND SKULL: Hyperostosis frontalis interna. No acute soft tissue abnormality. No skull fracture. IMPRESSION: 1. No acute intracranial abnormality. 2. ASPECTS score 10. 3. Mild chronic microvascular ischemic changes. 4. Findings messaged to Dr. Michaela via the [facility] messaging system at 7:33 PM on 09/03/24. Electronically signed by: Donnice Mania MD 09/03/2024 07:34 PM EST RP Workstation: [id number]    Vitals:   09/04/24 0845 09/04/24 0900 09/04/24 0945 09/04/24 1000  BP: 122/60 (!) 141/70 (!) 107/54 113/61  Pulse: 79 80 60 (!) 57  Resp: 15 20 13 13   Temp:      TempSrc:      SpO2: 100% 100% 100% 100%  Weight:         PHYSICAL EXAM General: Critically ill, intubated  and sedated obese elderly female Psych:  Mood and affect appropriate for situation CV: Regular rate and rhythm on monitor Respiratory:  Regular, unlabored respirations on ventilator GI: Abdomen soft and nontender   NEURO:  Mental Status: She is intubated and sedated, eyes are closed does not open eyes to voice or noxious stimuli is not following commands.  Pupils are equal with left gaze preference, unable to assess facial symmetry due to ET tube.  She does appear to be moving all extremities to noxious stimuli however the right side appears to be weaker than the left   ASSESSMENT/PLAN  Kristi Hunter is a 84 y.o. female with history of hypertension, prediabetes, history of breast cancer and uterine cancer, anxiety, emphysema, glaucoma, OSA on CPAP, lymphedema, vitamin B12 deficiency, CKD who presents with right-sided weakness that started abruptly at 6 PM. 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  NIH on Admission 16  Acute Ischemic Infarct:  left basal ganglia and parietal lobe s/p TNK and mechanical thrombectomy of left M1 with TICI 2B revascularization Etiology: Likely embolic Code Stroke CT head No acute abnormality. Small vessel disease. ASPECTS 10.    CTA head & neck  Acute occlusion of the proximal M1 segment of the left MCA. There is contrast filling of a few distal left MCA branches, likely via mild collaterals. MRI acute infarcts in left basal ganglia and punctate infarct in left parietal lobe 2D Echo EF 60 to 65%. Limited echo with bubble ordered Lower extremity Doppler ordered LDL 84 HgbA1c 6.1 VTE prophylaxis -SCDs No antithrombotic prior to admission, now on No antithrombotic for 24  hours post TNK and 24-hour brain imaging negative for hemorrhage Therapy recommendations:  Pending Disposition: Pending  Hypertension Home meds: Hygroton  25 mg, Lasix  20  mg UnStable-requiring Levophed  at the moment Blood Pressure Goal: SBP 120-160 for first 24 hours then less than 180   Hyperlipidemia Home meds: None LDL 84, goal < 70 Add atorvastatin  40 mg Continue statin at discharge  Acute respiratory failure status post mechanical thrombectomy COPD OSA on CPAP Management per CCM SBT as tolerated Sedation as directed by CCM Bronchodilators ordered   Diabetes type II Controlled Home meds: Metformin , Zepbound  HgbA1c 6.1, goal < 7.0 CBGs SSI Recommend close follow-up with PCP for better DM control  Hypokalemia K2 0.6-2.7 Replacing Will check in the morning  Dysphagia Patient has post-stroke dysphagia, SLP consulted    Diet   Diet NPO time specified   Advance diet as tolerated  Leukocytosis WBC 14.1-afebrile Will continue to monitor  Other Stroke Risk Factors Obesity, Body mass index is 43.35 kg/m., BMI >/= 30 associated with increased stroke risk, recommend weight loss, diet and exercise as appropriate  Obstructive sleep apnea, on CPAP at home    Other Active Problems History of breast cancer and uterine cancer.  On tamoxifen  Lymphedema Anxiety CKD B12 deficiency  Hospital day # 1   Karna Geralds DNP, ACNPC-AG  [facility]  I have personally obtained history,examined this patient, reviewed notes, independently viewed imaging studies, participated in medical decision making and plan of care.ROS completed by me personally and pertinent positives fully documented  I have made any additions or clarifications directly to the above note. Agree with note above.  Patient presented with aphasia and right hemiparesis due to left M1 occlusion and underwent treatment with IV thrombolysis followed by mechanical thrombectomy successfully.  MRI scan shows small basal ganglia and left parietal infarcts.  Remains intubated and sedated.  Recommend weaning off ventilatory support as per quality critical care team.  Close  neurological monitoring and .  Endovascular postthrombolytic and thrombectomy protocols.  Continue ongoing stroke workup.  Check echocardiogram and cardiac monitoring.  Long discussion with patient daughter and granddaughter at the bedside and answered questions.  Discussed with Dr. Theodoro critical care medicine. This patient is critically ill and at significant risk of neurological worsening, death and care requires constant monitoring of vital signs, hemodynamics,respiratory and cardiac monitoring, extensive review of multiple databases, frequent neurological assessment, discussion with family, other specialists and medical decision making of high complexity.I have made any additions or clarifications directly to the above note.This critical care time does not reflect procedure time, or teaching time or supervisory time of PA/NP/Med Resident etc but could involve care discussion time.  I spent 30 minutes of neurocritical care time  in the care of  this patient.     Eather Popp, MD Medical Director [facility] Pager: [phone] 09/04/2024 2:55 PM   To contact Stroke Continuity provider, please refer to [email or web address]. After hours, contact General Neurology

## 2024-09-04 NOTE — Progress Notes (Signed)
 OT Cancellation Note  Patient Details Name: Kristi Hunter MRN: [id number] DOB: [birth year]   Cancelled Treatment:    Reason Eval/Treat Not Completed: Active bedrest order (strict order beginning 09/03/24 at 2001). Additionally intubated, on multiple pressors. Will follow up as pt medically ready and OT schedule allows.   [name] FREDERICK, OTR/L [facility] Acute Rehabilitation Office: [phone]   [name] 09/04/2024, 7:09 AM

## 2024-09-04 NOTE — Progress Notes (Signed)
 SLP Cancellation Note  Patient Details Name: Kristi Hunter MRN: [id number] DOB: Apr 23, 1941   Cancelled treatment:       Reason Eval/Treat Not Completed: Medical issues which prohibited therapy  Patient currently on the vent.  ST will continue efforts.   Eleanor Eagles, MA, CCC-SLP Acute Rehab SLP [phone]  Eleanor LOISE Eagles 09/04/2024, 6:10 AM

## 2024-09-04 NOTE — Plan of Care (Signed)
" °  Problem: Education: Goal: Knowledge of disease or condition will improve Outcome: Progressing Goal: Knowledge of secondary prevention will improve (MUST DOCUMENT ALL) Outcome: Progressing Goal: Knowledge of patient specific risk factors will improve (DELETE if not current risk factor) Outcome: Progressing   Problem: Health Behavior/Discharge Planning: Goal: Goals will be collaboratively established with patient/family Outcome: Progressing   Problem: Self-Care: Goal: Ability to participate in self-care as condition permits will improve Outcome: Progressing Goal: Verbalization of feelings and concerns over difficulty with self-care will improve Outcome: Progressing   Problem: Nutrition: Goal: Risk of aspiration will decrease Outcome: Progressing   "

## 2024-09-05 ENCOUNTER — Inpatient Hospital Stay (HOSPITAL_COMMUNITY)

## 2024-09-05 DIAGNOSIS — J9600 Acute respiratory failure, unspecified whether with hypoxia or hypercapnia: Secondary | ICD-10-CM

## 2024-09-05 DIAGNOSIS — I63512 Cerebral infarction due to unspecified occlusion or stenosis of left middle cerebral artery: Secondary | ICD-10-CM | POA: Diagnosis not present

## 2024-09-05 DIAGNOSIS — J95821 Acute postprocedural respiratory failure: Secondary | ICD-10-CM | POA: Diagnosis not present

## 2024-09-05 DIAGNOSIS — I6389 Other cerebral infarction: Secondary | ICD-10-CM | POA: Diagnosis not present

## 2024-09-05 DIAGNOSIS — J449 Chronic obstructive pulmonary disease, unspecified: Secondary | ICD-10-CM | POA: Diagnosis not present

## 2024-09-05 DIAGNOSIS — I63412 Cerebral infarction due to embolism of left middle cerebral artery: Secondary | ICD-10-CM | POA: Diagnosis not present

## 2024-09-05 DIAGNOSIS — G4733 Obstructive sleep apnea (adult) (pediatric): Secondary | ICD-10-CM | POA: Diagnosis not present

## 2024-09-05 DIAGNOSIS — I129 Hypertensive chronic kidney disease with stage 1 through stage 4 chronic kidney disease, or unspecified chronic kidney disease: Secondary | ICD-10-CM | POA: Diagnosis not present

## 2024-09-05 LAB — ECHOCARDIOGRAM LIMITED BUBBLE STUDY
Area-P 1/2: 3.24 cm2
S' Lateral: 2.9 cm

## 2024-09-05 LAB — COMPREHENSIVE METABOLIC PANEL WITH GFR
ALT: 30 U/L (ref 0–44)
AST: 37 U/L (ref 15–41)
Albumin: 2.9 g/dL — ABNORMAL LOW (ref 3.5–5.0)
Alkaline Phosphatase: 46 U/L (ref 38–126)
Anion gap: 10 (ref 5–15)
BUN: 13 mg/dL (ref 8–23)
CO2: 26 mmol/L (ref 22–32)
Calcium: 8.7 mg/dL — ABNORMAL LOW (ref 8.9–10.3)
Chloride: 103 mmol/L (ref 98–111)
Creatinine, Ser: 0.73 mg/dL (ref 0.44–1.00)
GFR, Estimated: >60 mL/min
Glucose, Bld: 118 mg/dL — ABNORMAL HIGH (ref 70–99)
Potassium: 3.9 mmol/L (ref 3.5–5.1)
Sodium: 138 mmol/L (ref 135–145)
Total Bilirubin: 0.3 mg/dL (ref 0.0–1.2)
Total Protein: 5.8 g/dL — ABNORMAL LOW (ref 6.5–8.1)

## 2024-09-05 LAB — CBC
HCT: 34.1 % — ABNORMAL LOW (ref 36.0–46.0)
Hemoglobin: 11 g/dL — ABNORMAL LOW (ref 12.0–15.0)
MCH: 27.7 pg (ref 26.0–34.0)
MCHC: 32.3 g/dL (ref 30.0–36.0)
MCV: 85.9 fL (ref 80.0–100.0)
Platelets: 298 10*3/uL (ref 150–400)
RBC: 3.97 MIL/uL (ref 3.87–5.11)
RDW: 14.5 % (ref 11.5–15.5)
WBC: 11.6 10*3/uL — ABNORMAL HIGH (ref 4.0–10.5)
nRBC: 0 % (ref 0.0–0.2)

## 2024-09-05 LAB — GLUCOSE, CAPILLARY
Glucose-Capillary: 114 mg/dL — ABNORMAL HIGH (ref 70–99)
Glucose-Capillary: 124 mg/dL — ABNORMAL HIGH (ref 70–99)
Glucose-Capillary: 129 mg/dL — ABNORMAL HIGH (ref 70–99)
Glucose-Capillary: 98 mg/dL (ref 70–99)
Glucose-Capillary: 89 mg/dL (ref 70–99)
Glucose-Capillary: 116 mg/dL — ABNORMAL HIGH (ref 70–99)

## 2024-09-05 MED ORDER — ASPIRIN 81 MG PO CHEW
81.0000 mg | CHEWABLE_TABLET | Freq: Every day | ORAL | Status: DC
  Administered 2024-09-05 – 2024-09-07 (×3): 81 mg via ORAL
  Filled 2024-09-05 (×3): qty 1

## 2024-09-05 MED ORDER — ENSURE PLUS HIGH PROTEIN PO LIQD
237.0000 mL | Freq: Two times a day (BID) | ORAL | Status: DC
  Administered 2024-09-06: 237 mL via ORAL

## 2024-09-05 MED ORDER — ACETAMINOPHEN 325 MG PO TABS
650.0000 mg | ORAL_TABLET | Freq: Four times a day (QID) | ORAL | Status: DC | PRN
  Administered 2024-09-06 (×3): 650 mg via ORAL
  Filled 2024-09-05 (×3): qty 2

## 2024-09-05 MED ORDER — CLOPIDOGREL BISULFATE 75 MG PO TABS
75.0000 mg | ORAL_TABLET | Freq: Every day | ORAL | Status: DC
  Administered 2024-09-05 – 2024-09-07 (×3): 75 mg via ORAL
  Filled 2024-09-05 (×3): qty 1

## 2024-09-05 MED ORDER — PANTOPRAZOLE SODIUM 40 MG PO TBEC
40.0000 mg | DELAYED_RELEASE_TABLET | Freq: Every day | ORAL | Status: DC
  Administered 2024-09-06 – 2024-09-07 (×2): 40 mg via ORAL
  Filled 2024-09-05 (×2): qty 1

## 2024-09-05 MED ORDER — ATORVASTATIN CALCIUM 40 MG PO TABS
40.0000 mg | ORAL_TABLET | Freq: Every day | ORAL | Status: DC
  Administered 2024-09-06 – 2024-09-07 (×2): 40 mg via ORAL
  Filled 2024-09-05 (×2): qty 1

## 2024-09-05 NOTE — Progress Notes (Signed)
 Echocardiogram 2D Echocardiogram has been performed.  Kristi Hunter 09/05/2024, 1:32 PM

## 2024-09-05 NOTE — Progress Notes (Signed)
 Pt. Refused cpap at this time. Pt. States she will notify if she decides to wear cpap.

## 2024-09-05 NOTE — Plan of Care (Addendum)
 0900:  CCM Dr. Theodoro rounded on pt.  BP goals changed verbally to sys above 90 and MAP of 65.  See provider note.  0915:  Stroke team rounded on pt.  Strict bedrest order discontinued per verbal order by Dr. Rosemarie to mobilize pt.   1041:  PT rounded on pt.  See PT note.  Pt remains in recliner.  1136:  OT at bedside for splint placement.  See OT note.  1315: ECHO at bedside.  See note/results.   Problem: Education: Goal: Knowledge of disease or condition will improve Outcome: Progressing Goal: Knowledge of secondary prevention will improve (MUST DOCUMENT ALL) Outcome: Progressing Goal: Knowledge of patient specific risk factors will improve (DELETE if not current risk factor) Outcome: Progressing   Problem: Ischemic Stroke/TIA Tissue Perfusion: Goal: Complications of ischemic stroke/TIA will be minimized Outcome: Progressing   Problem: Coping: Goal: Will verbalize positive feelings about self Outcome: Progressing Goal: Will identify appropriate support needs Outcome: Progressing   Problem: Health Behavior/Discharge Planning: Goal: Ability to manage health-related needs will improve Outcome: Progressing Goal: Goals will be collaboratively established with patient/family Outcome: Progressing   Problem: Self-Care: Goal: Ability to participate in self-care as condition permits will improve Outcome: Progressing Goal: Verbalization of feelings and concerns over difficulty with self-care will improve Outcome: Progressing Goal: Ability to communicate needs accurately will improve Outcome: Progressing   Problem: Nutrition: Goal: Risk of aspiration will decrease Outcome: Progressing Goal: Dietary intake will improve Outcome: Progressing   Problem: Education: Goal: Ability to describe self-care measures that may prevent or decrease complications (Diabetes Survival Skills Education) will improve Outcome: Progressing Goal: Individualized Educational Video(s) Outcome: Progressing    Problem: Coping: Goal: Ability to adjust to condition or change in health will improve Outcome: Progressing   Problem: Fluid Volume: Goal: Ability to maintain a balanced intake and output will improve Outcome: Progressing   Problem: Health Behavior/Discharge Planning: Goal: Ability to identify and utilize available resources and services will improve Outcome: Progressing Goal: Ability to manage health-related needs will improve Outcome: Progressing   Problem: Metabolic: Goal: Ability to maintain appropriate glucose levels will improve Outcome: Progressing   Problem: Nutritional: Goal: Maintenance of adequate nutrition will improve Outcome: Progressing Goal: Progress toward achieving an optimal weight will improve Outcome: Progressing   Problem: Skin Integrity: Goal: Risk for impaired skin integrity will decrease Outcome: Progressing   Problem: Tissue Perfusion: Goal: Adequacy of tissue perfusion will improve Outcome: Progressing   Problem: Education: Goal: Knowledge of General Education information will improve Description: Including pain rating scale, medication(s)/side effects and non-pharmacologic comfort measures Outcome: Progressing   Problem: Health Behavior/Discharge Planning: Goal: Ability to manage health-related needs will improve Outcome: Progressing   Problem: Clinical Measurements: Goal: Ability to maintain clinical measurements within normal limits will improve Outcome: Progressing Goal: Will remain free from infection Outcome: Progressing Goal: Diagnostic test results will improve Outcome: Progressing Goal: Respiratory complications will improve Outcome: Progressing Goal: Cardiovascular complication will be avoided Outcome: Progressing   Problem: Activity: Goal: Risk for activity intolerance will decrease Outcome: Progressing   Problem: Nutrition: Goal: Adequate nutrition will be maintained Outcome: Progressing   Problem: Coping: Goal:  Level of anxiety will decrease Outcome: Progressing   Problem: Elimination: Goal: Will not experience complications related to bowel motility Outcome: Progressing Goal: Will not experience complications related to urinary retention Outcome: Progressing   Problem: Pain Managment: Goal: General experience of comfort will improve and/or be controlled Outcome: Progressing   Problem: Safety: Goal: Ability to remain free from injury will  improve Outcome: Progressing   Problem: Skin Integrity: Goal: Risk for impaired skin integrity will decrease Outcome: Progressing   Problem: Education: Goal: Understanding of CV disease, CV risk reduction, and recovery process will improve Outcome: Progressing Goal: Individualized Educational Video(s) Outcome: Progressing   Problem: Activity: Goal: Ability to return to baseline activity level will improve Outcome: Progressing   Problem: Cardiovascular: Goal: Ability to achieve and maintain adequate cardiovascular perfusion will improve Outcome: Progressing Goal: Vascular access site(s) Level 0-1 will be maintained Outcome: Progressing   Problem: Health Behavior/Discharge Planning: Goal: Ability to safely manage health-related needs after discharge will improve Outcome: Progressing   Problem: Safety: Goal: Non-violent Restraint(s) Outcome: Progressing

## 2024-09-05 NOTE — TOC Initial Note (Incomplete)
 Transition of Care [facility]) - Initial/Assessment Note    Patient Details  Name: Kristi Hunter MRN: [id number] Date of Birth: [birth year]  [facility] CM/SW Contact:    Gwenn Julien [place], [place] Phone Number: 09/05/2024, 2:11 PM  Clinical Narrative:                   Expected Discharge Plan: Skilled Nursing Facility Barriers to Discharge: Continued Medical Work up, English As A Second Language Teacher   Patient Goals and CMS Choice   CMS Medicare.gov Compare Post Acute Care list provided to:: Patient Choice offered to / list presented to : Patient [employer] ownership interest in [facility].provided to:: Patient    Expected Discharge Plan and Services     Post Acute Care Choice: Skilled Nursing Facility Living arrangements for the past 2 months: Independent Living Facility                                      Prior Living Arrangements/Services Living arrangements for the past 2 months: Independent Living Facility Lives with:: Self Patient language and need for interpreter reviewed:: Yes        Need for Family Participation in Patient Care: Yes (Comment) Care giver support system in place?: No (comment)   Criminal Activity/Legal Involvement Pertinent to Current Situation/Hospitalization: No - Comment as needed  Activities of Daily Living      Permission Sought/Granted                  Emotional Assessment       Orientation: : Oriented to Self, Oriented to Place, Oriented to  Time, Oriented to Situation Alcohol / Substance Use: Not Applicable Psych Involvement: No (comment)  Admission diagnosis:  Stroke (cerebrum) (HCC) [I63.9] Cerebrovascular accident (CVA), unspecified mechanism (HCC) [I63.9] Patient Active Problem List   Diagnosis Date Noted   Acute respiratory insufficiency, postoperative 09/04/2024   Acute ischemic left MCA stroke (HCC) 09/03/2024   Bilateral edema of lower extremity 01/15/2023   BMI 40.0-44.9, adult (HCC)  12/03/2022   B12 deficiency 07/31/2022   Dehydration 05/29/2022   Urge incontinence 05/29/2022   Depression 04/23/2022   Venous insufficiency 03/15/2022   Pure hypercholesterolemia 02/20/2022   Genetic testing 02/27/2021   Personal history of malignant neoplasm of uterus 02/14/2021   Family history of breast cancer 02/14/2021   Malignant neoplasm of upper-outer quadrant of right breast in female, estrogen receptor positive (HCC) 02/08/2021   Right knee pain 01/29/2021   Allergic rhinitis due to allergen 03/22/2020   OSA (obstructive sleep apnea) 03/22/2020   Left knee pain 01/18/2020   Other insomnia 01/18/2020   Apnea 01/18/2020   Morbid obesity (HCC) 01/18/2020   Musculoskeletal back pain 01/18/2020   Nocturia 10/12/2019   Prediabetes 05/10/2019   Glaucoma of both eyes 05/10/2019   Essential hypertension 05/10/2019   Dry scalp 05/10/2019   Osteoporosis 05/10/2019   Seborrheic keratosis 01/11/2019   Rosacea 01/11/2019   Family history of colon cancer 01/11/2019   Edema 08/27/2018   Primary osteoarthritis 08/27/2018   Sciatica of right side 08/27/2018   Depression, major, single episode, complete remission 08/27/2018   Vitamin D  deficiency 08/27/2018   Mixed hyperlipidemia 08/27/2018   Anxiety 08/27/2018   Hyperglycemia 08/27/2018   PCP:  Melchor Ozell PARAS, MD (Inactive) Pharmacy:   [facility] [place], [place] - [address] [place] [address] Phone: [phone] Fax:  [phone]     Social Drivers of Health (SDOH) Social History: SDOH Screenings   Food Insecurity: Low Risk (01/08/2024)   Received from [facility]  Housing: Low Risk (01/08/2024)   Received from [facility]  Transportation Needs: No Transportation Needs (01/08/2024)   Received from [facility]  Utilities: Low Risk (01/08/2024)   Received from [facility]  Alcohol Screen: Low Risk (08/17/2021)  Depression (PHQ2-9): Low Risk (03/15/2022)  Financial  Resource Strain: Low Risk (10/04/2021)  Physical Activity: Inactive (08/17/2021)  Social Connections: Moderately Integrated (08/17/2021)  Stress: No Stress Concern Present (08/17/2021)  Tobacco Use: Medium Risk (09/03/2024)   SDOH Interventions:     Readmission Risk Interventions     No data to display

## 2024-09-05 NOTE — Evaluation (Signed)
 Physical Therapy Evaluation Patient Details Name: Kristi Hunter MRN: [id number] DOB: March 27, 1941 Today's Date: 09/05/2024  History of Present Illness  84 y.o. female presents to [facility] on 09/03/2024 after a fall with R weakness and dysarthria. CTA with L MCA occlusion. Pt received TNK and underwent thrombectomy on 1/23. MRI revealed acute infarcts involving the left basal ganglia and punctate  focus of acute infarct in the left parietal lobe. Imaging showed acute mildly displaced distal R fifth metacarpal shaft fracture.  PMH includes OSA, COPD, HTN, anxiety/depression.   Clinical Impression  Pt presents with condition above and deficits mentioned below, see PT Problem List. PTA, she was mod I for functional mobility, holding onto furniture in her apartment, using a rollator around her ILF, and using a SPC when going to and from her car outside her ILF. She is currently displaying mild R leg weakness compared to her L along with deficits in balance and activity tolerance. She needs frequent reminders to maintain her R hand NWB precautions with all mobility. The pt is at high risk for falls, needing modA for bed mobility, modAx2 to transfer sit to stand, and maxAx2 to step pivot with L HHA and R elbow locked with therapist for support. Considering her functional decline and need for frequent assistance at this time, she could benefit from short-term inpatient rehab, < 3 hours/day, prior to return to her apartment at her ILF. Will continue to follow acutely.   BP  115/53 (70) supine start of session 66/49 (56) sitting after transfer to chair (pt endorsed dizziness when asked, RN made aware immediately and pt reclined) 123/55 (76) reclined end of session after RN increased her IV meds         If plan is discharge home, recommend the following: Two people to help with walking and/or transfers;Two people to help with bathing/dressing/bathroom;Assistance with cooking/housework;Direct  supervision/assist for medications management;Direct supervision/assist for financial management;Assist for transportation;Help with stairs or ramp for entrance;Supervision due to cognitive status   Can travel by private vehicle   No    Equipment Recommendations Rolling walker (2 wheels);BSC/3in1;Wheelchair (measurements PT);Wheelchair cushion (measurements PT) (all bariatric; R UE platform RW)  Recommendations for Other Services       Functional Status Assessment Patient has had a recent decline in their functional status and demonstrates the ability to make significant improvements in function in a reasonable and predictable amount of time.     Precautions / Restrictions Precautions Precautions: Fall;Other (comment) Precaution/Restrictions Comments: watch BP (hypotensive) Restrictions Weight Bearing Restrictions Per Provider Order: Yes RUE Weight Bearing Per Provider Order: Weight bear through elbow only (NWB through hand per verbal order from MD 1/25)      Mobility  Bed Mobility Overal bed mobility: Needs Assistance Bed Mobility: Supine to Sit     Supine to sit: Mod assist, HOB elevated     General bed mobility comments: Pt needed frequent reminders to keep R hand in lap and not push through R hand on bed during bed mobility. Cued pt to bring bil legs off L EOB and push up with L hand on bed to ascend trunk and scoot hips to EOB. She needed modA at trunk and scooting hips    Transfers Overall transfer level: Needs assistance Equipment used: 2 person hand held assist Transfers: Sit to/from Stand, Bed to chair/wheelchair/BSC Sit to Stand: Mod assist, +2 physical assistance, +2 safety/equipment   Step pivot transfers: Max assist, +2 safety/equipment, +2 physical assistance       General  transfer comment: R knee blocked and R elbow locked in with therapist's to pull and support herself while she received L HHA for support as well. Pt needed modAx2 to power up to stand and  cues to extend hips, pt reporting upright posture being limited by back pain. MaxAx2 needed to shift her weight bil and cue and assist her to advance each leg laterally to her L to transfer step pivot from EOB to recliner with L HHA and R elbow locked with therapist for support    Ambulation/Gait Ambulation/Gait assistance: Max assist, +2 physical assistance, +2 safety/equipment Gait Distance (Feet): 2 Feet Assistive device: 2 person hand held assist Gait Pattern/deviations: Step-to pattern, Decreased step length - right, Decreased step length - left, Decreased stride length, Decreased weight shift to right, Decreased weight shift to left, Trunk flexed Gait velocity: reduced Gait velocity interpretation: <1.31 ft/sec, indicative of household ambulator   General Gait Details: MaxAx2 needed to shift her weight bil and cue and assist her to advance each leg laterally to her L to transfer step pivot from EOB to recliner with L HHA and R elbow locked with therapist for support. R knee blocked for safety  Stairs            Wheelchair Mobility     Tilt Bed    Modified Rankin (Stroke Patients Only) Modified Rankin (Stroke Patients Only) Pre-Morbid Rankin Score: Slight disability Modified Rankin: Severe disability     Balance Overall balance assessment: Needs assistance Sitting-balance support: Single extremity supported, Feet supported Sitting balance-Leahy Scale: Poor Sitting balance - Comments: L UE support, CGA for safety sitting statically EOB   Standing balance support: Bilateral upper extremity supported, During functional activity Standing balance-Leahy Scale: Poor Standing balance comment: Reliant on UE support and external physical assistance of +2 to stand statically and then to take steps                             Pertinent Vitals/Pain Pain Assessment Pain Assessment: Faces Faces Pain Scale: Hurts little more Pain Location: R hand, back Pain  Descriptors / Indicators: Discomfort, Grimacing, Guarding Pain Intervention(s): Limited activity within patient's tolerance, Monitored during session, Repositioned    Home Living Family/patient expects to be discharged to:: Assisted living ([facility])                 Home Equipment: [place] - single point;Rollator (4 wheels) Additional Comments: Lives on first floor of [facility] with level entry apartment, walk-in shower, and handicap height toilet    Prior Function Prior Level of Function : Independent/Modified Independent;Driving;History of Falls (last six months)             Mobility Comments: Uses rollator around [place], holds onto furniture for short distances in her apartment, uses [facility] when she goes to her car or walks into stores until she reaches the power scooter to use at stores; x2 falls in past 6 months ADLs Comments: Independent, cooks some and other times goes to the dining area     Extremity/Trunk Assessment   Upper Extremity Assessment Upper Extremity Assessment: Defer to OT evaluation;RUE deficits/detail RUE Deficits / Details: R hand edema and bruising noted    Lower Extremity Assessment Lower Extremity Assessment: RLE deficits/detail RLE Deficits / Details: mild weakness noted on R leg compared to L, specifically with hip flexion (MMT 3/5 on R, >3+/5 on L) and ankle dorsiflexion (MMT of 4/5 on R and 5/5 on  L); Quads MMT 5/5 bil; gross incoordination noted; denied any acute sensory changes, reporting hx of peripheral neuropathy in bil feet only RLE Sensation: history of peripheral neuropathy RLE Coordination: decreased gross motor    Cervical / Trunk Assessment Cervical / Trunk Assessment: Other exceptions Cervical / Trunk Exceptions: increased body habitus  Communication   Communication Communication: No apparent difficulties    Cognition Arousal: Alert Behavior During Therapy: WFL for tasks assessed/performed   PT - Cognitive  impairments: Memory, Sequencing, Awareness                       PT - Cognition Comments: Pt needed reminders of her R hand injury and precautions. She needed multi-modal cues to sequence mobility. A&O to self, location, situation, and current year and month (not exact date). Following commands: Impaired Following commands impaired: Follows multi-step commands with increased time, Follows one step commands with increased time     Cueing Cueing Techniques: Verbal cues, Tactile cues     General Comments General comments (skin integrity, edema, etc.): SpO2 stable on RA; BP 115/53 (70) supine start of session, 66/49 (56) sitting after transfer to chair (pt endorsed dizziness when asked), 123/55 (76) reclined end of session after RN increased her IV meds    Exercises     Assessment/Plan    PT Assessment Patient needs continued PT services  PT Problem List Decreased strength;Decreased activity tolerance;Decreased balance;Decreased mobility;Decreased coordination;Decreased cognition;Cardiopulmonary status limiting activity;Impaired sensation;Obesity       PT Treatment Interventions DME instruction;Gait training;Functional mobility training;Therapeutic activities;Therapeutic exercise;Balance training;Neuromuscular re-education;Cognitive remediation;Patient/family education;Wheelchair mobility training    PT Goals (Current goals can be found in the Care Plan section)  Acute Rehab PT Goals Patient Stated Goal: to improve PT Goal Formulation: With patient Time For Goal Achievement: 09/19/24 Potential to Achieve Goals: Good    Frequency Min 2X/week     Co-evaluation               AM-PAC PT 6 Clicks Mobility  Outcome Measure Help needed turning from your back to your side while in a flat bed without using bedrails?: A Lot Help needed moving from lying on your back to sitting on the side of a flat bed without using bedrails?: A Lot Help needed moving to and from a bed to  a chair (including a wheelchair)?: Total Help needed standing up from a chair using your arms (e.g., wheelchair or bedside chair)?: Total Help needed to walk in hospital room?: Total Help needed climbing 3-5 steps with a railing? : Total 6 Click Score: 8    End of Session Equipment Utilized During Treatment: Gait belt Activity Tolerance: Patient tolerated treatment well Patient left: in chair;with call bell/phone within reach;with chair alarm set Nurse Communication: Mobility status;Need for lift equipment;Other (comment) (BP during session) PT Visit Diagnosis: Unsteadiness on feet (R26.81);Other abnormalities of gait and mobility (R26.89);Muscle weakness (generalized) (M62.81);Difficulty in walking, not elsewhere classified (R26.2);History of falling (Z91.81);Other symptoms and signs involving the nervous system (R29.898)    Time: 8955-8892 PT Time Calculation (min) (ACUTE ONLY): 23 min   Charges:   PT Evaluation $PT Eval Moderate Complexity: 1 Mod PT Treatments $Therapeutic Activity: 8-22 mins PT General Charges $$ ACUTE PT VISIT: 1 Visit         Theo Ferretti, PT, DPT Acute Rehabilitation Services  Office: [phone]   Theo CHRISTELLA Ferretti 09/05/2024, 12:49 PM

## 2024-09-05 NOTE — [place] FL2 (Addendum)
 " [place]  MEDICAID FL2 LEVEL OF CARE FORM     IDENTIFICATION  Patient Name: Kristi Hunter Birthdate: Jul 03, 1941 Sex: female Admission Date (Current Location): 09/03/2024  [facility] and [place] Number:  Producer, Television/film/video and Address:  [facility], [address] [place], [place] [address]      Provider Number: [id number]  Attending Physician Name and Address:  Stroke, Md, MD  Relative Name and Phone Number:       Current Level of Care: Hospital Recommended Level of Care: Skilled Nursing Facility Prior Approval Number:    Date Approved/Denied:   PASRR Number: [id number]  Discharge Plan: SNF    Current Diagnoses: Patient Active Problem List   Diagnosis Date Noted   Acute respiratory insufficiency, postoperative 09/04/2024   Acute ischemic left MCA stroke (HCC) 09/03/2024   Bilateral edema of lower extremity 01/15/2023   BMI 40.0-44.9, adult (HCC) 12/03/2022   B12 deficiency 07/31/2022   Dehydration 05/29/2022   Urge incontinence 05/29/2022   Depression 04/23/2022   Venous insufficiency 03/15/2022   Pure hypercholesterolemia 02/20/2022   Genetic testing 02/27/2021   Personal history of malignant neoplasm of uterus 02/14/2021   Family history of breast cancer 02/14/2021   Malignant neoplasm of upper-outer quadrant of right breast in female, estrogen receptor positive (HCC) 02/08/2021   Right knee pain 01/29/2021   Allergic rhinitis due to allergen 03/22/2020   OSA (obstructive sleep apnea) 03/22/2020   Left knee pain 01/18/2020   Other insomnia 01/18/2020   Apnea 01/18/2020   Morbid obesity (HCC) 01/18/2020   Musculoskeletal back pain 01/18/2020   Nocturia 10/12/2019   Prediabetes 05/10/2019   Glaucoma of both eyes 05/10/2019   Essential hypertension 05/10/2019   Dry scalp 05/10/2019   Osteoporosis 05/10/2019   Seborrheic keratosis 01/11/2019   Rosacea 01/11/2019   Family history of colon cancer 01/11/2019   Edema 08/27/2018    Primary osteoarthritis 08/27/2018   Sciatica of right side 08/27/2018   Depression, major, single episode, complete remission 08/27/2018   Vitamin D  deficiency 08/27/2018   Mixed hyperlipidemia 08/27/2018   Anxiety 08/27/2018   Hyperglycemia 08/27/2018    Orientation RESPIRATION BLADDER Height & Weight     Self, Time, Situation, Place  Oxygen  2L [place] Continent Weight: 244 lb 11.4 oz (111 kg) Height:     BEHAVIORAL SYMPTOMS/MOOD NEUROLOGICAL BOWEL NUTRITION STATUS      Continent Diet (See dc summary)  AMBULATORY STATUS COMMUNICATION OF NEEDS Skin   Extensive Assist Verbally Normal                       Personal Care Assistance Level of Assistance  Bathing, Dressing, Feeding Bathing Assistance: Limited assistance Feeding assistance: Limited assistance Dressing Assistance: Limited assistance     Functional Limitations Info  Sight, Hearing, Speech Sight Info: Impaired Hearing Info: Adequate Speech Info: Adequate    SPECIAL CARE FACTORS FREQUENCY  PT (By licensed PT), OT (By licensed OT)                    Contractures Contractures Info: Not present    Additional Factors Info  Code Status, Allergies Code Status Info: FULL CODE Allergies Info: Amoxicillin (High Intolerance); Percocet (oxycodone -acetaminophen )           Current Medications (09/05/2024):  This is the current hospital active medication list Current Facility-Administered Medications  Medication Dose Route Frequency Provider Last Rate Last Admin   0.9 %  sodium chloride  infusion   Intravenous  Continuous Michaela Aisha SQUIBB, MD 40 mL/hr at 09/05/24 1242 Infusion Verify at 09/05/24 1242   arformoterol  (BROVANA ) nebulizer solution 15 mcg  15 mcg Nebulization BID Smith, Joshua C, NP   15 mcg at 09/05/24 9145   aspirin  chewable tablet 81 mg  81 mg Oral Daily Waddell Aquas A, NP   81 mg at 09/05/24 1200   atorvastatin  (LIPITOR) tablet 40 mg  40 mg Per Tube Daily Waddell Aquas A, NP   40 mg at 09/05/24  1047   budesonide  (PULMICORT ) nebulizer solution 0.25 mg  0.25 mg Nebulization BID Smith, Joshua C, NP   0.25 mg at 09/05/24 9148   Chlorhexidine  Gluconate Cloth 2 % PADS 6 each  6 each Topical Q0600 Michaela Aisha SQUIBB, MD   6 each at 09/04/24 0600   clevidipine  (CLEVIPREX ) infusion 0.5 mg/mL  0-21 mg/hr Intravenous Continuous Ray Coy, MD       clopidogrel  (PLAVIX ) tablet 75 mg  75 mg Oral Daily Waddell Aquas A, NP   75 mg at 09/05/24 1200   dexmedetomidine  (PRECEDEX ) 400 MCG/100ML (4 mcg/mL) infusion  0-1.2 mcg/kg/hr Intravenous Titrated Theodoro Lakes, MD   Stopped at 09/04/24 1351   fentaNYL  (SUBLIMAZE ) injection 25-50 mcg  25-50 mcg Intravenous Q2H PRN Smith, Joshua C, NP   25 mcg at 09/05/24 0431   insulin  aspart (novoLOG ) injection 0-15 Units  0-15 Units Subcutaneous Q4H Michaela Aisha SQUIBB, MD   2 Units at 09/05/24 1201   labetalol  (NORMODYNE ) injection 10 mg  10 mg Intravenous Once PRN Michaela Aisha SQUIBB, MD       And   nicardipine  (CARDENE ) 20mg  in 0.86% saline IV infusion (0.1 mg/ml)  0-15 mg/hr Intravenous Continuous PRN Michaela Aisha SQUIBB, MD       norepinephrine  (LEVOPHED ) 4mg  in (0.016 mg/mL) premix infusion  0-10 mcg/min Intravenous Titrated Claudene Fonda BROCKS, NP 15 mL/hr at 09/05/24 1242 4 mcg/min at 09/05/24 1242   Oral care mouth rinse  15 mL Mouth Rinse PRN Michaela Aisha SQUIBB, MD       pantoprazole  (PROTONIX ) injection 40 mg  40 mg Intravenous QHS Michaela Aisha SQUIBB, MD   40 mg at 09/04/24 2209   propofol  (DIPRIVAN ) 1000 MG/100ML infusion  0-80 mcg/kg/min Intravenous Titrated Michaela Aisha SQUIBB, MD   Stopped at 09/04/24 1149   revefenacin  (YUPELRI ) nebulizer solution 175 mcg  175 mcg Nebulization Daily Smith, Joshua C, NP   175 mcg at 09/05/24 0848     Discharge Medications: Please see discharge summary for a list of discharge medications.  Relevant Imaging Results:  Relevant Lab Results:   Additional Information SS#  [id number]  [place]wenn Julien [place], [place]     "

## 2024-09-05 NOTE — Progress Notes (Signed)
 Orthopedic Tech Progress Note Patient Details:  Kristi Hunter Jul 31, 1941 [id number]  Patient ID: Kristi Hunter, female   DOB: 11-15-1940, 84 y.o.   MRN: [id number] Removable velcro ulnar gutter splint for the RUE called into [facility] as routine order  Kura Bethards OTR/L 09/05/2024, 12:12 PM

## 2024-09-05 NOTE — Plan of Care (Signed)
" °  Problem: Education: Goal: Knowledge of disease or condition will improve Outcome: Progressing Goal: Knowledge of secondary prevention will improve (MUST DOCUMENT ALL) Outcome: Progressing Goal: Knowledge of patient specific risk factors will improve (DELETE if not current risk factor) Outcome: Progressing   Problem: Coping: Goal: Will identify appropriate support needs Outcome: Progressing   Problem: Health Behavior/Discharge Planning: Goal: Ability to manage health-related needs will improve Outcome: Progressing Goal: Goals will be collaboratively established with patient/family Outcome: Progressing   "

## 2024-09-05 NOTE — Progress Notes (Signed)
 Orthopedic Tech Progress Note Patient Details:  Kristi Hunter 15-May-1941 [id number]  Ortho Devices Type of Ortho Device: Ulna gutter splint Ortho Device/Splint Location: RUE Ortho Device/Splint Interventions: Ordered, Application   Post Interventions Patient Tolerated: Well Initial order for removable velcro ulnar gutter.  Discussed with MD that these were not available but could place routine order through [facility] and fabricate ulnar gutter for now until that one arrives.  He was in agreement with this plan.  Ulnar gutter applied slightly shorter secondary to patient having IV line in the forearm.    Tanith Dagostino OTR/L 09/05/2024, 11:55 AM

## 2024-09-05 NOTE — Progress Notes (Addendum)
 STROKE TEAM PROGRESS NOTE   INTERIM HISTORY/SUBJECTIVE No family at the bedside.  Patient was extubated yesterday and is on nasal cannula doing well.  Patient remains on Levophed  drip will attempt to get that off today systolic blood pressure less than 180 Neurological exam she is awake alert oriented, subtle right facial droop right arm drift right weak grip and bilateral lower extremities equal  CBC    Component Value Date/Time   WBC 11.6 (H) 09/05/2024 0452   RBC 3.97 09/05/2024 0452   HGB 11.0 (L) 09/05/2024 0452   HGB 12.6 02/20/2022 0942   HCT 34.1 (L) 09/05/2024 0452   HCT 39.6 02/20/2022 0942   PLT 298 09/05/2024 0452   PLT 318 02/20/2022 0942   MCV 85.9 09/05/2024 0452   MCV 87 02/20/2022 0942   MCH 27.7 09/05/2024 0452   MCHC 32.3 09/05/2024 0452   RDW 14.5 09/05/2024 0452   RDW 13.0 02/20/2022 0942   LYMPHSABS 2.1 09/04/2024 1215   LYMPHSABS 2.3 02/20/2022 0942   MONOABS 0.8 09/04/2024 1215   EOSABS 0.1 09/04/2024 1215   EOSABS 0.2 02/20/2022 0942   BASOSABS 0.1 09/04/2024 1215   BASOSABS 0.1 02/20/2022 0942    BMET    Component Value Date/Time   NA 138 09/05/2024 0452   NA 144 06/17/2023 1105   K 3.9 09/05/2024 0452   CL 103 09/05/2024 0452   CO2 26 09/05/2024 0452   GLUCOSE 118 (H) 09/05/2024 0452   BUN 13 09/05/2024 0452   BUN 18 06/17/2023 1105   CREATININE 0.73 09/05/2024 0452   CREATININE 0.76 12/11/2021 1226   CALCIUM  8.7 (L) 09/05/2024 0452   EGFR 91 06/17/2023 1105   GFRNONAA >60 09/05/2024 0452   GFRNONAA >60 12/11/2021 1226    IMAGING past 24 hours CT HEAD WO CONTRAST Result Date: 09/04/2024 EXAM: CT HEAD WITHOUT CONTRAST 09/03/2024 TECHNIQUE: CT of the head was performed without the administration of intravenous contrast. Automated exposure control, iterative reconstruction, and/or weight based adjustment of the mA/kV was utilized to reduce the radiation dose to as low as reasonably achievable. COMPARISON: None available. CLINICAL HISTORY:  Stroke, follow up. FINDINGS: BRAIN AND VENTRICLES: No acute hemorrhage. No evidence of acute infarct. No hydrocephalus. No extra-axial collection. No mass effect or midline shift. ORBITS: Bilateral lens replacement. SINUSES: No acute abnormality. SOFT TISSUES AND SKULL: No acute soft tissue abnormality. No skull fracture. IMPRESSION: 1. No acute intracranial abnormality. Electronically signed by: Morgane Naveau MD 09/04/2024 08:37 PM EST RP Workstation: [id number]   DG Abd Portable 1V Result Date: 09/04/2024 EXAM: 1 VIEW XRAY OF THE ABDOMEN 09/04/2024 03:01:00 PM COMPARISON: None available. CLINICAL HISTORY: Encounter for imaging study to confirm orogastric tube placement. FINDINGS: LINES, TUBES AND DEVICES: An enteric tube is present with the tip projecting over the left upper quadrant, consistent with location in the body of the stomach. BOWEL: Nonobstructive bowel gas pattern. SOFT TISSUES: Limited field of view for evaluation of ureteral calculi. No radiopaque stones. BONES: No acute fracture. Degenerative changes in the spine. IMPRESSION: 1. Enteric tube tip projects over the left upper quadrant, consistent with location in the body of the stomach. Electronically signed by: Elsie Gravely MD 09/04/2024 03:15 PM EST RP Workstation: [id number]   MR BRAIN WO CONTRAST Result Date: 09/04/2024 EXAM: MRI BRAIN WITHOUT CONTRAST 09/04/2024 11:43:53 AM TECHNIQUE: Multiplanar multisequence MRI of the head/brain was performed without the administration of intravenous contrast. COMPARISON: CT head and CTA head and neck 07/14/2025. CLINICAL HISTORY: Stroke, follow up. FINDINGS: BRAIN AND VENTRICLES:  Acute infarct involving the posterior aspect of the left lentiform nucleus partially extending into the posterior limb of the internal capsule. Additional region of acute infarct in the body and tail of the left caudate nucleus. Smaller areas of acute infarct involving the left globus pallidus and anterior aspect of the  lentiform nucleus. There is a punctate focus of acute infarct in the left parietal lobe. No significant edema or mass effect. There is no midline shift. No hydrocephalus. T2 and FLAIR hyperintensity in the periventricular and subcortical white matter compatible with mild chronic microvascular ischemic changes. Skull base flow voids are visualized including the main flow voids of the left MCA. ORBITS: Bilateral lens replacement. SINUSES AND MASTOIDS: Mild mucosal thickening in the ethmoid sinuses. Fluid in the nasopharynx. BONES AND SOFT TISSUES: Normal marrow signal. Partially visualized endotracheal tube. IMPRESSION: 1. Acute infarcts involving the left basal ganglia as described above. Punctate focus of acute infarct in the left parietal lobe. 2. No significant edema, mass effect, or midline shift. Electronically signed by: Donnice Mania MD 09/04/2024 12:46 PM EST RP Workstation: [id number]    Vitals:   09/05/24 0600 09/05/24 0630 09/05/24 0700 09/05/24 0800  BP: (!) 124/51 (!) 125/53 (!) 120/49 (!) 119/51  Pulse: 83 81 90 88  Resp: 14 14 (!) 24 20  Temp:      TempSrc:      SpO2: 95% 95% 94% 95%  Weight:         PHYSICAL EXAM General: Patient is not extubated.  Obese elderly female not in distress Psych:  Mood and affect appropriate for situation CV: Regular rate and rhythm on monitor Respiratory:  Regular, unlabored respirations on ventilator GI: Abdomen soft and nontender   NEURO:  Mental Status: She is awake alert oriented to time place and person.  No aphasia.  Slight dysarthria.  Follows commands well.  Extraocular movements are full range without nystagmus.  Blink to threat bilaterally.  Mild right lower facial weakness.  Tongue midline.  Motor system exam no upper or lower extremity drift but mild weakness of right grip intrinsic hand muscles with negative flexors.  Fine movements are diminished on the right.  Orthopedic left lower extremity.  Patient is intact.  Gait not  tested.   ASSESSMENT/PLAN  [name] is a 84 y.o. female with history of hypertension, prediabetes, history of breast cancer and uterine cancer, anxiety, emphysema, glaucoma, OSA on CPAP, lymphedema, vitamin B12 deficiency, CKD who presents with right-sided weakness that started abruptly at 6 PM. 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  NIH on Admission 16  Acute Ischemic Infarct:  left basal ganglia and parietal lobe s/p TNK and mechanical thrombectomy of left M1 with TICI 2B revascularization Etiology: Likely embolic cryptogenic source Code Stroke CT head No acute abnormality. Small vessel disease. ASPECTS 10.    CTA head & neck  Acute occlusion of the proximal M1 segment of the left MCA. There is contrast filling of a few distal left MCA branches, likely via mild collaterals. MRI acute infarcts in left basal ganglia and punctate infarct in left parietal lobe 2D Echo EF 60 to 65%.  Left atrial size normal Limited echo with bubble ordered Lower extremity Doppler ordered Will need a loop recorder at discharge LDL 84 HgbA1c 6.1 VTE prophylaxis -SCDs No antithrombotic prior to admission, now on aspirin  81 mg daily and clopidogrel  75 mg daily for  3 weeks then aspirin  alone Therapy recommendations:  Pending Disposition: Pending  Hypertension Home meds: Hygroton  25 mg, Lasix  20 mg UnStable-requiring Levophed  at the moment Blood Pressure Goal: SBP 120-160 for first 24 hours then less than 180   Hyperlipidemia Home meds: None LDL 84, goal < 70 Add atorvastatin  40 mg Continue statin at discharge  Acute respiratory failure status post mechanical thrombectomy COPD OSA on CPAP Extubated 1/24-currently on nasal cannula Bronchodilators ordered   Diabetes type II Controlled Home meds: Metformin , Zepbound  HgbA1c 6.1, goal < 7.0 CBGs SSI Recommend close follow-up with PCP  for better DM control  Hypokalemia, improved K 2.6-2.7-3.9 Replacing Will check in the morning  Dysphagia Patient has post-stroke dysphagia, SLP consulted    Diet   Diet regular Room service appropriate? Yes; Fluid consistency: Thin   Advance diet as tolerated  Leukocytosis, improving WBC 14.1-11.6-afebrile Will continue to monitor  Other Stroke Risk Factors Obesity, Body mass index is 43.35 kg/m., BMI >/= 30 associated with increased stroke risk, recommend weight loss, diet and exercise as appropriate  Obstructive sleep apnea, on CPAP at home    Other Active Problems History of breast cancer and uterine cancer.  On tamoxifen  Lymphedema Anxiety CKD B12 deficiency  Hospital day # 2   Karna Geralds DNP, ACNPC-AG  [facility]  I have personally obtained history,examined this patient, reviewed notes, independently viewed imaging studies, participated in medical decision making and plan of care.ROS completed by me personally and pertinent positives fully documented  I have made any additions or clarifications directly to the above note. Agree with note above.  Patient is doing very well.  Recommend mobilize out of bed.  Physical Occupational Therapy consults.  Resume home medications.  Wean off vasopressor support.  Start diet.  Will likely need loop recorder at discharge to look for paroxysmal A-fib.  Start antiplatelet therapy.  Aggressive risk factor modification.  No family at the bedside This patient is critically ill and at significant risk of neurological worsening, death and care requires constant monitoring of vital signs, hemodynamics,respiratory and cardiac monitoring, extensive review of multiple databases, frequent neurological assessment, discussion with family, other specialists and medical decision making of high complexity.I have made any additions or clarifications directly to the above note.This critical care time does not reflect procedure time, or  teaching time or supervisory time of PA/NP/Med Resident etc but could involve care discussion time.  I spent 30 minutes of neurocritical care time  in the care of  this patient.     Eather Popp, MD Medical Director [facility] Pager: [phone] 09/05/2024 11:00 AM   To contact Stroke Continuity provider, please refer to [email or web address]. After hours, contact General Neurology

## 2024-09-05 NOTE — Progress Notes (Signed)
 Attempted BLE venous duplex, however patient was with occupational therapy and in the chair.  Will re attempt as patient availability and schedule permit.  Ezzie Potters, RVT/RDMS 09/05/2024 11:36

## 2024-09-05 NOTE — Consult Note (Signed)
 "  NAME:  Kristi Hunter, MRN:  [id number], DOB:  [birth year], LOS: 2 ADMISSION DATE:  09/03/2024, CONSULTATION DATE: 1/24 REFERRING MD: 1/24 CHIEF COMPLAINT: CVA  History of Present Illness:  Pt is a 84 year old female with significant past medical history of HTN, lymphedema, prediabetes,, breast cancer hx on tamoxifen , morbid obesity, Obstructive Sleep Apnea, COPD and anxiety who presents to [facility], ED for fall and right sided weakness by EMS from [facility] facility.  Last known well was approximately 1800, patient had a witnessed fall and did hit her head.  Per rapid response note code stroke was called at 1847 for left-sided gaze, dysarthria, right-sided weakness.  CT of head was negative hemorrhage, negative for acute intracranial abnormality, CT of cervical spine-negative for acute changes, CT angio head and neck noted an acute occlusion of the proximal M1 segment of the left MCA, and no significant stenosis in cervical carotid/vertebral arteries.  Patient was ultimately given TNK at 1934, and interventional radiology was activated for code stroke in which a left M1 thrombectomy was performed. Patient remained intubated after procedure.  PCCM consulted to assist with critical care and vent management while residing in ICU.   Pertinent  Medical History   Past Medical History:  Diagnosis Date   Anemia    Anxiety    Arthritis    several different parts of the body per pt 10/17/2022   Back pain    Breast cancer (HCC)    02/20/21   Cancer (HCC)    uterine   Chest pain    Chicken pox    Chronic kidney disease    Constipation    Emphysema of lung (HCC)    Family history of breast cancer 02/14/2021   Glaucoma    Hypertension    Joint pain    Lower extremity edema    Lymphedema    Osteoarthritis    Personal history of malignant neoplasm of uterus 02/14/2021   Pre-diabetes    Prediabetes    Shortness of breath    Sleep apnea    doesn't use CPAP machine anymore   Urine  incontinence    Uterine cancer (HCC)    2011   UTI (urinary tract infection)    Vertigo    had vertigo last week. 10/17/2022   Vitamin B 12 deficiency    during pregnancy     Significant Hospital Events: Including procedures, antibiotic start and stop dates in addition to other pertinent events   1/23-code stroke,  acute occlusion of the proximal M1 segment of the left MCA> IR s/p left M1 thrombectomy 1/24 extubated 1/25 BP goal reduced to normal BP and now tapering down on Levophed   Interim History / Subjective:  Following commands.  No overnight events.  Tapering down on Levophed .  On supplemental oxygen .  Moving all extremities.  Objective    Blood pressure (!) 114/51, pulse 84, temperature 98 F (36.7 C), temperature source Oral, resp. rate 13, weight 111 kg, SpO2 (!) 88%.    FiO2 (%):  [40 %] 40 %   Intake/Output Summary (Last 24 hours) at 09/05/2024 1518 Last data filed at 09/05/2024 1449 Gross per 24 hour  Intake 3018.48 ml  Output 1090 ml  Net 1928.48 ml   Filed Weights   09/03/24 1918  Weight: 111 kg    Examination: General: Elderly female currently following commands. Lungs: clear to auscultation bilaterally.  Heart: regular rate rhythm, no murmur appreciated.  Abdomen: non tender, non distended. Normal BS.  Neuro: Alert and oriented  and denies any complaints.  Moving all extremities. Bilateral upper extremity swelling along with some swelling in the legs.  Right hand swelling as well.   Resolved problem list   Assessment and Plan  Acute CVA of M1 segment of left MCA s/p TNK and left M1 thrombectomy LKW- 1800- 1/23, TNK given at 1934  CT of head/C-spine-negative, CT head/neck angio-acute occlusion of the proximal M1 segment of the left MCA> IR s/p left M1 thrombectomy Acute respiratory insufficiency/postop-intubation in setting of above-extubated on 1/24 History of Obstructive sleep apnea- noncompliance with CPAP COPD- 01/01/2024 noting ratio 82% FEV1 62%  FVC 75% DLCO 41% - on trelegy  Follows up with [facility], no smoking hx  P: MRI: Showed acute left basal ganglia infarct.  No other changes. Echo reviewed: Normal EF.  No changes compared to before. SBP goal relaxed.  Aim for normotensive.  Weaning down on Levophed . BD- order yulperi, brovana , pulmicort  nebs.  Stop these bronchodilators and resume Trelegy close to discharge. CPAP for OSA.  If patient does not want to be on CPAP it is okay to DC.  Hypertension P: SBP goal as above.  Holding home chlorthalidone  and Lasix    Prediabetes On metformin   Morbid Obesity- on tirzepatide  weekly injections  P: Hold metformin   CBG q4 SSI  CBG goal 140-180   Right fifth metacarpal fracture: - Discussed with orthopedics on-call on 1/25.  Will need outpatient Ortho follow-up. - In the meanwhile splint has been ordered per Ortho recommendation.  Right Breast Cancer Hx (2022) Estrogen Receptive Positive  Hold tamoxifen  currently.  Will need to discuss this with stroke team as it increases risk for strokes.  Lymphedema P: Currently stable, continue to monitor Compression stockings  Anxiety P: Supportive care   Continue with ICU level of care for now till the patient is on Levophed .  Will transfer out of ICU once off Levophed .  Labs   CBC: Recent Labs  Lab 09/04/24 0601 09/04/24 1215 09/05/24 0452  WBC  --  14.1* 11.6*  NEUTROABS  --  11.1*  --   HGB 10.5* 10.6* 11.0*  HCT 31.0* 31.6* 34.1*  MCV  --  83.2 85.9  PLT  --  335 298    Basic Metabolic Panel: Recent Labs  Lab 09/03/24 2256 09/04/24 0601 09/04/24 1215 09/04/24 2137 09/05/24 0452  NA 135 137 138 142 138  K 3.7 2.6* 2.7* 4.2 3.9  CL 95*  --  100 104 103  CO2 23  --  26 29 26   GLUCOSE 118*  --  140* 138* 118*  BUN 27*  --  18 15 13   CREATININE 0.90  --  0.73 0.77 0.73  CALCIUM  9.1  --  9.2 9.1 8.7*  MG  --   --  1.8  --   --    GFR: Estimated Creatinine Clearance: 66 mL/min (by C-G  formula based on SCr of 0.73 mg/dL). Recent Labs  Lab 09/04/24 1215 09/05/24 0452  WBC 14.1* 11.6*    Liver Function Tests: Recent Labs  Lab 09/03/24 2256 09/05/24 0452  AST 39 37  ALT 25 30  ALKPHOS 71 46  BILITOT 0.4 0.3  PROT 6.6 5.8*  ALBUMIN  3.3* 2.9*   No results for input(s): LIPASE, AMYLASE in the last 168 hours. No results for input(s): AMMONIA in the last 168 hours.  ABG    Component Value Date/Time   PHART 7.408 09/04/2024 0601   PCO2ART 44.5 09/04/2024 0601   PO2ART 172 (H)  09/04/2024 0601   HCO3 28.3 (H) 09/04/2024 0601   TCO2 30 09/04/2024 0601   O2SAT 100 09/04/2024 0601     Coagulation Profile: Recent Labs  Lab 09/03/24 2256  INR 1.1    Cardiac Enzymes: No results for input(s): CKTOTAL, CKMB, CKMBINDEX, TROPONINI in the last 168 hours.  HbA1C: Hemoglobin A1C  Date/Time Value Ref Range Status  08/23/2022 12:00 AM 5.8  Final   Hgb A1c MFr Bld  Date/Time Value Ref Range Status  09/04/2024 12:15 PM 5.8 (H) 4.8 - 5.6 % Final    Comment:    (NOTE) Diagnosis of Diabetes The following HbA1c ranges recommended by the American Diabetes Association (ADA) may be used as an aid in the diagnosis of diabetes mellitus.  Hemoglobin             Suggested A1C NGSP%              Diagnosis  <5.7                   Non Diabetic  5.7-6.4                Pre-Diabetic  >6.4                   Diabetic  <7.0                   Glycemic control for                       adults with diabetes.    06/17/2023 11:05 AM 6.1 (H) 4.8 - 5.6 % Final    Comment:             Prediabetes: 5.7 - 6.4          Diabetes: >6.4          Glycemic control for adults with diabetes: <7.0     CBG: Recent Labs  Lab 09/04/24 1940 09/04/24 2328 09/05/24 0334 09/05/24 0729 09/05/24 1149  GLUCAP 124* 123* 114* 124* 129*    Review of Systems:   See HPI   Past Medical History:  She,  has a past medical history of Anemia, Anxiety, Arthritis, Back pain,  Breast cancer (HCC), Cancer (HCC), Chest pain, Chicken pox, Chronic kidney disease, Constipation, Emphysema of lung (HCC), Family history of breast cancer (02/14/2021), Glaucoma, Hypertension, Joint pain, Lower extremity edema, Lymphedema, Osteoarthritis, Personal history of malignant neoplasm of uterus (02/14/2021), Pre-diabetes, Prediabetes, Shortness of breath, Sleep apnea, Urine incontinence, Uterine cancer (HCC), UTI (urinary tract infection), Vertigo, and Vitamin B 12 deficiency.   Surgical History:   Past Surgical History:  Procedure Laterality Date   ABDOMINAL HYSTERECTOMY  2011   BOTOX  INJECTION N/A 10/22/2022   Procedure: BOTOX  INJECTION 100 UNITS;  Surgeon: Elisabeth Valli BIRCH, MD;  Location: [facility];  Service: Urology;  Laterality: N/A;   BREAST LUMPECTOMY WITH RADIOACTIVE SEED LOCALIZATION Right 02/20/2021   Procedure: RIGHT BREAST LUMPECTOMY WITH RADIOACTIVE SEED LOCALIZATION;  Surgeon: Aron Shoulders, MD;  Location: [facility] OR;  Service: General;  Laterality: Right;   CYSTOSCOPY WITH INJECTION N/A 10/22/2022   Procedure: CYSTOSCOPY WITH INJECTION OF LUEVENIA;  Surgeon: Elisabeth Valli BIRCH, MD;  Location: [facility];  Service: Urology;  Laterality: N/A;   IR ANGIOGRAM EXTREMITY RIGHT  03/18/2023   IR ANGIOGRAM SELECTIVE EACH ADDITIONAL VESSEL  03/18/2023   IR ANGIOGRAM SELECTIVE EACH ADDITIONAL VESSEL  03/18/2023   IR CT HEAD LTD  09/03/2024  IR EMBO ARTERIAL NOT HEMORR HEMANG INC GUIDE ROADMAPPING  03/18/2023   IR PERCUTANEOUS ART THROMBECTOMY/INFUSION INTRACRANIAL INC DIAG ANGIO  09/03/2024   IR RADIOLOGIST EVAL & MGMT  02/05/2023   IR RADIOLOGIST EVAL & MGMT  04/18/2023   IR RADIOLOGIST EVAL & MGMT  12/29/2023   IR US  GUIDE VASC ACCESS RIGHT  03/18/2023   IR US  GUIDE VASC ACCESS RIGHT  09/03/2024   REPLACEMENT TOTAL KNEE Right 2012     Social History:   reports that she quit smoking about 42 years ago. Her smoking use included cigarettes. She has never been exposed  to tobacco smoke. She has never used smokeless tobacco. She reports current alcohol use of about 1.0 standard drink of alcohol per week. She reports that she does not use drugs.   Family History:  Her family history includes Alcohol abuse in her brother, brother, father, and paternal grandfather; Arthritis in her brother, maternal grandmother, and paternal grandfather; Asthma in her father; Breast cancer (age of onset: 6) in her paternal aunt; COPD in her father; Cancer in her brother, father, and mother; Cirrhosis in her paternal grandfather; Colon cancer in her maternal grandfather; Colon polyps in her daughter; Depression in her daughter and son; Diabetes in her maternal grandmother and mother; Drug abuse in her brother; Heart attack in her father and mother; Heart disease in her father and mother; Hyperlipidemia in her maternal grandmother and mother; Hypertension in her daughter and mother; Melanoma in her father and mother; Mental illness in her daughter; Miscarriages / Stillbirths in her mother; Obesity in her father and mother; Stroke in her mother; Thyroid  disease in her mother.   Allergies Allergies[1]   Home Medications  Prior to Admission medications  Medication Sig Start Date End Date Taking? Authorizing Provider  acetaminophen  (TYLENOL ) 650 MG CR tablet Take 650 mg by mouth every 8 (eight) hours as needed for pain. Takes usually once a day    [provider]  alendronate  (FOSAMAX ) 70 MG tablet TAKE 1 TABLET (70 MG TOTAL) BY MOUTH EVERY 7 (SEVEN) DAYS. TAKE WITH A FULL GLASS OF WATER ON AN EMPTY STOMACH. 04/04/22   Berneta Elsie Sayre, MD  chlorthalidone  (HYGROTON ) 25 MG tablet Take 1 tablet (25 mg total) by mouth daily. 03/15/22   Berneta Elsie Sayre, MD  diclofenac Sodium (VOLTAREN) 1 % GEL Apply 2 g topically daily as needed (Osteoarthritis).    [provider]  fluticasone  (FLONASE ) 50 MCG/ACT nasal spray Place 2 sprays into both nostrils in the morning and at  bedtime. 04/17/23   Verdon Parry D, MD  furosemide  (LASIX ) 20 MG tablet Take 1 tablet (20 mg total) by mouth daily. 05/06/24   Emelia Josefa HERO, NP  Melatonin 10 MG TABS Take 10 mg by mouth at bedtime as needed (sleep).    [provider]  metFORMIN  (GLUCOPHAGE ) 500 MG tablet Take 1 tablet (500 mg total) by mouth 2 (two) times daily with a meal. 07/21/24   Verdon, Caren D, MD  metroNIDAZOLE  (METROGEL ) 1 % gel Apply topically daily. Patient taking differently: Apply 1 application  topically daily as needed (rosacea). 01/29/21   Berneta Elsie Sayre, MD  Multiple Vitamin (MULTIVITAMIN) tablet Take 1 tablet by mouth daily.    [provider]  tamoxifen  (NOLVADEX ) 20 MG tablet Take 1 tablet (20 mg total) by mouth daily. 08/13/24   Iruku, Praveena, MD  tirzepatide  5 MG/0.5ML injection vial Inject 5 mg into the skin once a week. 07/21/24   Verdon Parry D,  MD  tiZANidine  (ZANAFLEX ) 2 MG tablet TAKE 1-2 TABLETS (2-4 MG TOTAL) BY MOUTH EVERY 8 (EIGHT) HOURS AS NEEDED. 06/26/23   Joane Artist RAMAN, MD  traZODone  (DESYREL ) 50 MG tablet Take 50 mg by mouth at bedtime as needed. 11/03/23   [provider]  DOMINIC BECK 100-62.5-25 MCG/ACT AEPB  11/10/23   [provider]    CRITICAL CARE Performed by: Sammi JONETTA Fredericks.     Total critical care time: 31 minutes   Critical care time was exclusive of separately billable procedures and treating other patients.   Critical care was necessary to treat or prevent imminent or life-threatening deterioration.   Critical care was time spent personally by me on the following activities: development of treatment plan with patient and/or surrogate as well as nursing, discussions with consultants, evaluation of patient's response to treatment, examination of patient, obtaining history from patient or surrogate, ordering and performing treatments and interventions, ordering and review of laboratory studies, ordering and review of radiographic  studies, pulse oximetry, re-evaluation of patient's condition and participation in multidisciplinary rounds.  Sammi JONETTA Fredericks, MD Pulmonary, Critical Care and Sleep Attending.   09/05/2024, 3:18 PM        [1]  Allergies Allergen Reactions   Amoxicillin Nausea And Vomiting   Percocet [Oxycodone -Acetaminophen ] Other (See Comments)    hallucinations   "

## 2024-09-06 ENCOUNTER — Encounter (HOSPITAL_COMMUNITY): Payer: Self-pay | Admitting: Radiology

## 2024-09-06 ENCOUNTER — Inpatient Hospital Stay (HOSPITAL_COMMUNITY)

## 2024-09-06 DIAGNOSIS — E1122 Type 2 diabetes mellitus with diabetic chronic kidney disease: Secondary | ICD-10-CM | POA: Diagnosis not present

## 2024-09-06 DIAGNOSIS — E785 Hyperlipidemia, unspecified: Secondary | ICD-10-CM | POA: Diagnosis not present

## 2024-09-06 DIAGNOSIS — G4733 Obstructive sleep apnea (adult) (pediatric): Secondary | ICD-10-CM | POA: Diagnosis not present

## 2024-09-06 DIAGNOSIS — Z6841 Body Mass Index (BMI) 40.0 and over, adult: Secondary | ICD-10-CM | POA: Diagnosis not present

## 2024-09-06 DIAGNOSIS — J9589 Other postprocedural complications and disorders of respiratory system, not elsewhere classified: Secondary | ICD-10-CM | POA: Diagnosis not present

## 2024-09-06 DIAGNOSIS — F419 Anxiety disorder, unspecified: Secondary | ICD-10-CM | POA: Diagnosis not present

## 2024-09-06 DIAGNOSIS — I639 Cerebral infarction, unspecified: Secondary | ICD-10-CM

## 2024-09-06 DIAGNOSIS — Z7902 Long term (current) use of antithrombotics/antiplatelets: Secondary | ICD-10-CM | POA: Diagnosis not present

## 2024-09-06 DIAGNOSIS — I63512 Cerebral infarction due to unspecified occlusion or stenosis of left middle cerebral artery: Secondary | ICD-10-CM | POA: Diagnosis not present

## 2024-09-06 DIAGNOSIS — N189 Chronic kidney disease, unspecified: Secondary | ICD-10-CM | POA: Diagnosis not present

## 2024-09-06 DIAGNOSIS — I10 Essential (primary) hypertension: Secondary | ICD-10-CM | POA: Diagnosis not present

## 2024-09-06 DIAGNOSIS — I63412 Cerebral infarction due to embolism of left middle cerebral artery: Secondary | ICD-10-CM | POA: Diagnosis not present

## 2024-09-06 DIAGNOSIS — R7303 Prediabetes: Secondary | ICD-10-CM | POA: Diagnosis not present

## 2024-09-06 DIAGNOSIS — I890 Lymphedema, not elsewhere classified: Secondary | ICD-10-CM | POA: Diagnosis not present

## 2024-09-06 DIAGNOSIS — E1151 Type 2 diabetes mellitus with diabetic peripheral angiopathy without gangrene: Secondary | ICD-10-CM | POA: Diagnosis not present

## 2024-09-06 DIAGNOSIS — Z7982 Long term (current) use of aspirin: Secondary | ICD-10-CM

## 2024-09-06 DIAGNOSIS — I129 Hypertensive chronic kidney disease with stage 1 through stage 4 chronic kidney disease, or unspecified chronic kidney disease: Secondary | ICD-10-CM | POA: Diagnosis not present

## 2024-09-06 DIAGNOSIS — Z7984 Long term (current) use of oral hypoglycemic drugs: Secondary | ICD-10-CM | POA: Diagnosis not present

## 2024-09-06 DIAGNOSIS — E669 Obesity, unspecified: Secondary | ICD-10-CM | POA: Diagnosis not present

## 2024-09-06 LAB — COMPREHENSIVE METABOLIC PANEL WITH GFR
ALT: 35 U/L (ref 0–44)
AST: 27 U/L (ref 15–41)
Albumin: 2.9 g/dL — ABNORMAL LOW (ref 3.5–5.0)
Alkaline Phosphatase: 42 U/L (ref 38–126)
Anion gap: 9 (ref 5–15)
BUN: 11 mg/dL (ref 8–23)
CO2: 27 mmol/L (ref 22–32)
Calcium: 8.3 mg/dL — ABNORMAL LOW (ref 8.9–10.3)
Chloride: 103 mmol/L (ref 98–111)
Creatinine, Ser: 0.78 mg/dL (ref 0.44–1.00)
GFR, Estimated: >60 mL/min
Glucose, Bld: 111 mg/dL — ABNORMAL HIGH (ref 70–99)
Potassium: 3.5 mmol/L (ref 3.5–5.1)
Sodium: 138 mmol/L (ref 135–145)
Total Bilirubin: 0.3 mg/dL (ref 0.0–1.2)
Total Protein: 5.6 g/dL — ABNORMAL LOW (ref 6.5–8.1)

## 2024-09-06 LAB — CBC
HCT: 29.6 % — ABNORMAL LOW (ref 36.0–46.0)
Hemoglobin: 9.4 g/dL — ABNORMAL LOW (ref 12.0–15.0)
MCH: 27.7 pg (ref 26.0–34.0)
MCHC: 31.8 g/dL (ref 30.0–36.0)
MCV: 87.3 fL (ref 80.0–100.0)
Platelets: 261 10*3/uL (ref 150–400)
RBC: 3.39 MIL/uL — ABNORMAL LOW (ref 3.87–5.11)
RDW: 14.8 % (ref 11.5–15.5)
WBC: 9.7 10*3/uL (ref 4.0–10.5)
nRBC: 0 % (ref 0.0–0.2)

## 2024-09-06 LAB — GLUCOSE, CAPILLARY
Glucose-Capillary: 100 mg/dL — ABNORMAL HIGH (ref 70–99)
Glucose-Capillary: 104 mg/dL — ABNORMAL HIGH (ref 70–99)
Glucose-Capillary: 143 mg/dL — ABNORMAL HIGH (ref 70–99)
Glucose-Capillary: 106 mg/dL — ABNORMAL HIGH (ref 70–99)
Glucose-Capillary: 112 mg/dL — ABNORMAL HIGH (ref 70–99)

## 2024-09-06 MED ORDER — MIDODRINE HCL 5 MG PO TABS: 5.0000 mg | ORAL_TABLET | Freq: Three times a day (TID) | ORAL | Status: DC

## 2024-09-06 MED ORDER — LACTATED RINGERS IV BOLUS
500.0000 mL | Freq: Once | INTRAVENOUS | Status: AC
  Administered 2024-09-06: 500 mL via INTRAVENOUS

## 2024-09-06 MED ORDER — POLYETHYLENE GLYCOL 3350 17 G PO PACK
17.0000 g | PACK | Freq: Every day | ORAL | Status: DC
  Filled 2024-09-06: qty 1

## 2024-09-06 MED ORDER — OXYCODONE HCL 5 MG PO TABS
5.0000 mg | ORAL_TABLET | ORAL | Status: DC | PRN
  Administered 2024-09-06: 5 mg via ORAL
  Filled 2024-09-06: qty 1

## 2024-09-06 MED ORDER — INSULIN ASPART 100 UNIT/ML IJ SOLN
0.0000 [IU] | Freq: Three times a day (TID) | INTRAMUSCULAR | Status: DC
  Administered 2024-09-06 – 2024-09-07 (×2): 2 [IU] via SUBCUTANEOUS
  Filled 2024-09-06 (×3): qty 2

## 2024-09-06 MED ORDER — ENOXAPARIN SODIUM 60 MG/0.6ML IJ SOSY
50.0000 mg | PREFILLED_SYRINGE | INTRAMUSCULAR | Status: DC
  Administered 2024-09-06: 50 mg via SUBCUTANEOUS
  Filled 2024-09-06: qty 0.6

## 2024-09-06 MED ORDER — POTASSIUM CHLORIDE CRYS ER 20 MEQ PO TBCR
20.0000 meq | EXTENDED_RELEASE_TABLET | Freq: Once | ORAL | Status: AC
  Administered 2024-09-06: 20 meq via ORAL
  Filled 2024-09-06: qty 1

## 2024-09-06 NOTE — Progress Notes (Addendum)
 STROKE TEAM PROGRESS NOTE   1/23: Presented as CODE STROKE. Taken emergently to IR, s/p Left M1 thrombectomy.  1/24: extubated  INTERIM HISTORY/SUBJECTIVE  No family at the bedside.  Patient was extubated 1/25.  Levophed  drip stopped this AM, stable BP, ok for <180 goal.  Will transfer out of ICU today.  Neurological exam with only mild right hemiparesis CBC    Component Value Date/Time   WBC 9.7 09/06/2024 0326   RBC 3.39 (L) 09/06/2024 0326   HGB 9.4 (L) 09/06/2024 0326   HGB 12.6 02/20/2022 0942   HCT 29.6 (L) 09/06/2024 0326   HCT 39.6 02/20/2022 0942   PLT 261 09/06/2024 0326   PLT 318 02/20/2022 0942   MCV 87.3 09/06/2024 0326   MCV 87 02/20/2022 0942   MCH 27.7 09/06/2024 0326   MCHC 31.8 09/06/2024 0326   RDW 14.8 09/06/2024 0326   RDW 13.0 02/20/2022 0942   LYMPHSABS 2.1 09/04/2024 1215   LYMPHSABS 2.3 02/20/2022 0942   MONOABS 0.8 09/04/2024 1215   EOSABS 0.1 09/04/2024 1215   EOSABS 0.2 02/20/2022 0942   BASOSABS 0.1 09/04/2024 1215   BASOSABS 0.1 02/20/2022 0942    BMET    Component Value Date/Time   NA 138 09/06/2024 0326   NA 144 06/17/2023 1105   K 3.5 09/06/2024 0326   CL 103 09/06/2024 0326   CO2 27 09/06/2024 0326   GLUCOSE 111 (H) 09/06/2024 0326   BUN 11 09/06/2024 0326   BUN 18 06/17/2023 1105   CREATININE 0.78 09/06/2024 0326   CREATININE 0.76 12/11/2021 1226   CALCIUM  8.3 (L) 09/06/2024 0326   EGFR 91 06/17/2023 1105   GFRNONAA >60 09/06/2024 0326   GFRNONAA >60 12/11/2021 1226    IMAGING past 24 hours VAS US  LOWER EXTREMITY VENOUS (DVT) Result Date: 09/06/2024  Lower Venous DVT Study Patient Name:  Kristi Hunter  Date of Exam:   09/06/2024 Medical Rec #: [id number]         Accession #:    [id number] Date of Birth: [birth year]        Patient Gender: F Patient Age:   84 years Exam Location:  [facility] Procedure:      VAS US  LOWER EXTREMITY VENOUS (DVT) Referring Phys: KARNA WOLFE  --------------------------------------------------------------------------------  Indications: Stroke.  Limitations: Body habitus and Pain with compression maneuvers. Comparison Study: Prior negative right LEV done 04/01/22 with reflux study Performing Technologist: Alberta Lis RVS  Examination Guidelines: A complete evaluation includes B-mode imaging, spectral Doppler, color Doppler, and power Doppler as needed of all accessible portions of each vessel. Bilateral testing is considered an integral part of a complete examination. Limited examinations for reoccurring indications may be performed as noted. The reflux portion of the exam is performed with the patient in reverse Trendelenburg.  +---------+---------------+---------+-----------+----------+-------------------+ RIGHT    CompressibilityPhasicitySpontaneityPropertiesThrombus Aging      +---------+---------------+---------+-----------+----------+-------------------+ CFV                     Yes      No                   patent by color and                                                       Doppler             +---------+---------------+---------+-----------+----------+-------------------+  FV Prox                 Yes      No                   patent by color and                                                       Doppler             +---------+---------------+---------+-----------+----------+-------------------+ FV Mid   Full                                                             +---------+---------------+---------+-----------+----------+-------------------+ FV DistalFull           Yes      No                                       +---------+---------------+---------+-----------+----------+-------------------+ PFV                                                   Not well visualized +---------+---------------+---------+-----------+----------+-------------------+ POP                     Yes       No                   patent by color and                                                       Doppler             +---------+---------------+---------+-----------+----------+-------------------+ PTV      Full                                                             +---------+---------------+---------+-----------+----------+-------------------+ PERO                                                  Not well visualized +---------+---------------+---------+-----------+----------+-------------------+   +---------+---------------+---------+-----------+----------+-------------------+ LEFT     CompressibilityPhasicitySpontaneityPropertiesThrombus Aging      +---------+---------------+---------+-----------+----------+-------------------+ CFV                     Yes      No                   patent by color  and                                                       Doppler             +---------+---------------+---------+-----------+----------+-------------------+ FV Prox                 Yes      No                   patent by color and                                                       Doppler             +---------+---------------+---------+-----------+----------+-------------------+ FV Mid                  Yes      No                   patent by color and                                                       Doppler             +---------+---------------+---------+-----------+----------+-------------------+ FV Distal               Yes      No                   patent by color and                                                       Doppler             +---------+---------------+---------+-----------+----------+-------------------+ PFV                                                   Not well visualized +---------+---------------+---------+-----------+----------+-------------------+ POP                     Yes       No                   patent by color and                                                       Doppler             +---------+---------------+---------+-----------+----------+-------------------+ PTV      Full                                                             +---------+---------------+---------+-----------+----------+-------------------+  PERO                                                  Continued           +---------+---------------+---------+-----------+----------+-------------------+    Summary: RIGHT: - There is no evidence of deep vein thrombosis in the lower extremity.  LEFT: - There is no evidence of deep vein thrombosis in the lower extremity.  *See table(s) above for measurements and observations.    Preliminary     Vitals:   09/06/24 1300 09/06/24 1315 09/06/24 1330 09/06/24 1345  BP: 112/60 (!) 114/51  122/61  Pulse: 87 86 89   Resp: (!) 21 (!) 21 14   Temp:      TempSrc:      SpO2: 100% 100% 95%   Weight:        PHYSICAL EXAM General: Obese elderly female not in distress Psych:  Mood and affect appropriate for situation CV: Regular rate and rhythm on monitor Respiratory:  Regular, unlabored respirations on room air GI: Abdomen soft and nontender   NEURO:  Mental Status: She is awake alert oriented to time place and person.  No aphasia.  Slight dysarthria.  Follows commands well.  Extraocular movements are full range without nystagmus.  Blink to threat bilaterally.  Tongue midline.  Motor system exam no upper or lower extremity drift.  Mild right grip and hand weakness.  Mild right hip flexor weakness.  Right hand fracture now with cast,  Sensation intact bilaterally.  Gait not tested.  ASSESSMENT/PLAN  Kristi Hunter is a 84 y.o. female with history of hypertension, prediabetes, history of breast cancer and uterine cancer, anxiety, emphysema, glaucoma, OSA on CPAP, lymphedema, vitamin B12 deficiency, CKD who presents with  right-sided weakness that started abruptly at 6 PM. 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  NIH on Admission 16  Acute Ischemic Infarct:  left basal ganglia and parietal lobe s/p TNK and mechanical thrombectomy of left M1 with TICI 2B revascularization Etiology: Likely embolic cryptogenic source Code Stroke CT head No acute abnormality. Small vessel disease. ASPECTS 10.    CTA head & neck  Acute occlusion of the proximal M1 segment of the left MCA. There is contrast filling of a few distal left MCA branches, likely via mild collaterals. MRI acute infarcts in left basal ganglia and punctate infarct in left parietal lobe 09/04/24 2D Echo EF 60 to 65%.  Left atrial size normal 09/05/24 Limited echo with bubble: EF 60-65% Negative bubble study.  Lower extremity Doppler Negative  Will need a loop recorder at discharge LDL 84 HgbA1c 6.1 VTE prophylaxis -SCDs, ok to start DVT prophylaxis No antithrombotic prior to admission, continue aspirin  81 mg daily and clopidogrel  75 mg daily for 3 weeks then aspirin  alone Therapy recommendations: SNF Encourage OOB for meals, as able.  Disposition: Pending  Hypertension Home meds: Hygroton  25 mg, Lasix  20 mg UnStable. Weaned off Levophed  this morning.  BP goal: < 180 Avoid hypotension, keep MAP > 65  Hyperlipidemia Home meds: None LDL 84, goal < 70 Continue atorvastatin  40 mg Continue statin at discharge  Acute respiratory failure status post mechanical thrombectomy COPD OSA on CPAP Extubated 1/24-currently on nasal cannula Bronchodilators ordered  Diabetes type II  Controlled Home meds: Metformin , Zepbound  HgbA1c 6.1, goal < 7.0 CBGs SSI Recommend close follow-up with PCP for better DM control  Hypokalemia, improved K 2.6-2.7-3.9-3.5 Replacing  Leukocytosis, improved WBC 14.1-11.6-9.7-afebrile Will continue to monitor  Other Stroke  Risk Factors Obesity, Body mass index is 43.35 kg/m., BMI >/= 30 associated with increased stroke risk, recommend weight loss, diet and exercise as appropriate  Obstructive sleep apnea, on CPAP at home  Other Active Problems History of breast cancer and uterine cancer.  On tamoxifen  Recommend followup with oncologist regarding risks/benefit review Lymphedema Anxiety CKD B12 deficiency  Hospital day # 3   Pt seen by Neuro NP/APP with MD. Note/plan to be edited by MD as needed.    Rocky JAYSON Likes, DNP [facility] Please use AMION for contact information & EPIC for messaging.  I have personally obtained history,examined this patient, reviewed notes, independently viewed imaging studies, participated in medical decision making and plan of care.ROS completed by me personally and pertinent positives fully documented  I have made any additions or clarifications directly to the above note. Agree with note above.  Patient continues to do well from neurological standpoint.  She is off vasopressor drip this morning.  Continue mobilize out of bed.  Ongoing therapy consults.  Transfer out of ICU to neurology stepdown bed.  Likely transfer to rehab in a few days.  Continue aspirin  Plavix  for 3 weeks then aspirin  alone.  Outpatient prolonged cardiac monitoring for paroxysmal A-fib.  No family at the bedside.  I personally spent a total of 50 minutes in the care of the patient today including getting/reviewing separately obtained history, performing a medically appropriate exam/evaluation, counseling and educating, placing orders, referring and communicating with other health care professionals, documenting clinical information in the EHR, independently interpreting results, and coordinating care.         Eather Popp, MD Medical Director [facility] Pager: [phone] 09/06/2024 2:22 PM

## 2024-09-06 NOTE — Progress Notes (Signed)
 VASCULAR LAB    Bilateral lower extremity venous duplex has been performed.  See CV proc for preliminary results.   Evana Runnels, RVT 09/06/2024, 9:54 AM

## 2024-09-06 NOTE — TOC Initial Note (Addendum)
 Transition of Care [facility]) - Initial/Assessment Note    Patient Details  Name: Kristi Hunter MRN: [id number] Date of Birth: 18-Mar-1941  [facility] CM/SW Contact:    Inocente GORMAN Kindle, LCSW Phone Number: 09/06/2024, 1:01 PM  Clinical Narrative:                 Patient admitted from [name] [place]. CSW spoke with her about SNF recommendation and she reported she would like to to the rehab side at [name] as she has Ronnald days there she can utilize. CSW confirmed with [name] that they have a SNF bed for her and request CSW begin insurance process.   Insurance approval received, Certification Number [id number], effective until 09/13/24.    Expected Discharge Plan: Skilled Nursing Facility Barriers to Discharge: Continued Medical Work up, English As A Second Language Teacher   Patient Goals and CMS Choice   CMS Medicare.gov Compare Post Acute Care list provided to:: Patient Choice offered to / list presented to : Patient Rives ownership interest in [facility].provided to:: Patient    Expected Discharge Plan and Services In-house Referral: Clinical Social Work   Post Acute Care Choice: Skilled Nursing Facility Living arrangements for the past 2 months: Independent Living Facility                                      Prior Living Arrangements/Services Living arrangements for the past 2 months: Independent Living Facility Lives with:: Self Patient language and need for interpreter reviewed:: Yes Do you feel safe going back to the place where you live?: Yes      Need for Family Participation in Patient Care: Yes (Comment) Care giver support system in place?: No (comment)   Criminal Activity/Legal Involvement Pertinent to Current Situation/Hospitalization: No - Comment as needed  Activities of Daily Living      Permission Sought/Granted Permission sought to share information with : Facility Industrial/product Designer granted to share  information with : Yes, Verbal Permission Granted     Permission granted to share info w AGENCY: [name]        Emotional Assessment Appearance:: Appears stated age Attitude/Demeanor/Rapport: Engaged Affect (typically observed): Accepting, Pleasant Orientation: : Oriented to Self, Oriented to Place, Oriented to  Time, Oriented to Situation Alcohol / Substance Use: Not Applicable Psych Involvement: No (comment)  Admission diagnosis:  Stroke (cerebrum) (HCC) [I63.9] Cerebrovascular accident (CVA), unspecified mechanism (HCC) [I63.9] Patient Active Problem List   Diagnosis Date Noted   Acute respiratory insufficiency, postoperative 09/04/2024   Acute ischemic left MCA stroke (HCC) 09/03/2024   Bilateral edema of lower extremity 01/15/2023   BMI 40.0-44.9, adult (HCC) 12/03/2022   B12 deficiency 07/31/2022   Dehydration 05/29/2022   Urge incontinence 05/29/2022   Depression 04/23/2022   Venous insufficiency 03/15/2022   Pure hypercholesterolemia 02/20/2022   Genetic testing 02/27/2021   Personal history of malignant neoplasm of uterus 02/14/2021   Family history of breast cancer 02/14/2021   Malignant neoplasm of upper-outer quadrant of right breast in female, estrogen receptor positive (HCC) 02/08/2021   Right knee pain 01/29/2021   Allergic rhinitis due to allergen 03/22/2020   OSA (obstructive sleep apnea) 03/22/2020   Left knee pain 01/18/2020   Other insomnia 01/18/2020   Apnea 01/18/2020   Morbid obesity (HCC) 01/18/2020   Musculoskeletal back pain 01/18/2020   Nocturia 10/12/2019   Prediabetes 05/10/2019   Glaucoma of both eyes 05/10/2019  Essential hypertension 05/10/2019   Dry scalp 05/10/2019   Osteoporosis 05/10/2019   Seborrheic keratosis 01/11/2019   Rosacea 01/11/2019   Family history of colon cancer 01/11/2019   Edema 08/27/2018   Primary osteoarthritis 08/27/2018   Sciatica of right side 08/27/2018   Depression, major, single episode, complete  remission 08/27/2018   Vitamin D  deficiency 08/27/2018   Mixed hyperlipidemia 08/27/2018   Anxiety 08/27/2018   Hyperglycemia 08/27/2018   PCP:  Melchor Ozell PARAS, MD (Inactive) Pharmacy:   [facility] - [place], [place] - [address] [place] [address] Phone: [phone] Fax: [phone]     Social Drivers of Health (SDOH) Social History: SDOH Screenings   Food Insecurity: No Food Insecurity (09/06/2024)  Housing: Unknown (09/06/2024)  Transportation Needs: No Transportation Needs (09/06/2024)  Utilities: Not At Risk (09/06/2024)  Alcohol Screen: Low Risk (08/17/2021)  Depression (PHQ2-9): Low Risk (03/15/2022)  Financial Resource Strain: Low Risk (10/04/2021)  Physical Activity: Inactive (08/17/2021)  Social Connections: Moderately Integrated (09/06/2024)  Stress: No Stress Concern Present (08/17/2021)  Tobacco Use: Medium Risk (09/03/2024)   SDOH Interventions:     Readmission Risk Interventions     No data to display

## 2024-09-06 NOTE — Plan of Care (Signed)
" °  Problem: Ischemic Stroke/TIA Tissue Perfusion: Goal: Complications of ischemic stroke/TIA will be minimized Outcome: Progressing   Problem: Coping: Goal: Will identify appropriate support needs Outcome: Progressing   Problem: Health Behavior/Discharge Planning: Goal: Goals will be collaboratively established with patient/family Outcome: Progressing   Problem: Self-Care: Goal: Ability to participate in self-care as condition permits will improve Outcome: Progressing Goal: Ability to communicate needs accurately will improve Outcome: Progressing   Problem: Nutrition: Goal: Risk of aspiration will decrease Outcome: Progressing Goal: Dietary intake will improve Outcome: Progressing   Problem: Metabolic: Goal: Ability to maintain appropriate glucose levels will improve Outcome: Progressing   Problem: Clinical Measurements: Goal: Cardiovascular complication will be avoided Outcome: Progressing   Problem: Coping: Goal: Level of anxiety will decrease Outcome: Progressing   "

## 2024-09-06 NOTE — Plan of Care (Addendum)
 0730:  CCM rounded on pt.  See provider note.  0930:  BLE ultrasound completed at bedside.  See results.  1100:  OT at bedside.  Pt tolerated transfer to the chair well with 2 x assist and steady chair.  See OT note.  1200:  IV team to bedside.  Ultrasound guided PIV placed.  See LDA. 1415:  Tx orders received.  Pt notified.  1800:  Pt transported to 3W22.  Pt transported with phone, phone charger, clothing, glasses, tumbler and assorted snacks.  Pt handed of to 3W RN [employer].  Pt calm and showed no signs of distress upon my departure.    Problem: Education: Goal: Knowledge of disease or condition will improve Outcome: Progressing Goal: Knowledge of secondary prevention will improve (MUST DOCUMENT ALL) Outcome: Progressing Goal: Knowledge of patient specific risk factors will improve (DELETE if not current risk factor) Outcome: Progressing   Problem: Ischemic Stroke/TIA Tissue Perfusion: Goal: Complications of ischemic stroke/TIA will be minimized Outcome: Progressing   Problem: Coping: Goal: Will verbalize positive feelings about self Outcome: Progressing Goal: Will identify appropriate support needs Outcome: Progressing   Problem: Health Behavior/Discharge Planning: Goal: Ability to manage health-related needs will improve Outcome: Progressing Goal: Goals will be collaboratively established with patient/family Outcome: Progressing   Problem: Self-Care: Goal: Ability to participate in self-care as condition permits will improve Outcome: Progressing Goal: Verbalization of feelings and concerns over difficulty with self-care will improve Outcome: Progressing Goal: Ability to communicate needs accurately will improve Outcome: Progressing   Problem: Nutrition: Goal: Risk of aspiration will decrease Outcome: Progressing Goal: Dietary intake will improve Outcome: Progressing   Problem: Education: Goal: Ability to describe self-care measures that may prevent or  decrease complications (Diabetes Survival Skills Education) will improve Outcome: Progressing Goal: Individualized Educational Video(s) Outcome: Progressing   Problem: Coping: Goal: Ability to adjust to condition or change in health will improve Outcome: Progressing   Problem: Fluid Volume: Goal: Ability to maintain a balanced intake and output will improve Outcome: Progressing   Problem: Health Behavior/Discharge Planning: Goal: Ability to identify and utilize available resources and services will improve Outcome: Progressing Goal: Ability to manage health-related needs will improve Outcome: Progressing   Problem: Metabolic: Goal: Ability to maintain appropriate glucose levels will improve Outcome: Progressing   Problem: Nutritional: Goal: Maintenance of adequate nutrition will improve Outcome: Progressing Goal: Progress toward achieving an optimal weight will improve Outcome: Progressing   Problem: Skin Integrity: Goal: Risk for impaired skin integrity will decrease Outcome: Progressing   Problem: Tissue Perfusion: Goal: Adequacy of tissue perfusion will improve Outcome: Progressing   Problem: Education: Goal: Knowledge of General Education information will improve Description: Including pain rating scale, medication(s)/side effects and non-pharmacologic comfort measures Outcome: Progressing   Problem: Health Behavior/Discharge Planning: Goal: Ability to manage health-related needs will improve Outcome: Progressing   Problem: Clinical Measurements: Goal: Ability to maintain clinical measurements within normal limits will improve Outcome: Progressing Goal: Will remain free from infection Outcome: Progressing Goal: Diagnostic test results will improve Outcome: Progressing Goal: Respiratory complications will improve Outcome: Progressing Goal: Cardiovascular complication will be avoided Outcome: Progressing   Problem: Activity: Goal: Risk for activity  intolerance will decrease Outcome: Progressing   Problem: Nutrition: Goal: Adequate nutrition will be maintained Outcome: Progressing   Problem: Coping: Goal: Level of anxiety will decrease Outcome: Progressing   Problem: Elimination: Goal: Will not experience complications related to bowel motility Outcome: Progressing Goal: Will not experience complications related to urinary retention Outcome: Progressing   Problem:  Pain Managment: Goal: General experience of comfort will improve and/or be controlled Outcome: Progressing   Problem: Safety: Goal: Ability to remain free from injury will improve Outcome: Progressing   Problem: Skin Integrity: Goal: Risk for impaired skin integrity will decrease Outcome: Progressing   Problem: Education: Goal: Understanding of CV disease, CV risk reduction, and recovery process will improve Outcome: Progressing Goal: Individualized Educational Video(s) Outcome: Progressing   Problem: Activity: Goal: Ability to return to baseline activity level will improve Outcome: Progressing   Problem: Cardiovascular: Goal: Ability to achieve and maintain adequate cardiovascular perfusion will improve Outcome: Progressing Goal: Vascular access site(s) Level 0-1 will be maintained Outcome: Progressing   Problem: Health Behavior/Discharge Planning: Goal: Ability to safely manage health-related needs after discharge will improve Outcome: Progressing   Problem: Safety: Goal: Non-violent Restraint(s) Outcome: Progressing

## 2024-09-06 NOTE — Progress Notes (Signed)
 "  NAME:  Kristi Hunter, MRN:  [id number], DOB:  1940-12-18, LOS: 3 ADMISSION DATE:  09/03/2024, CONSULTATION DATE: 1/24 REFERRING MD: 1/24 CHIEF COMPLAINT: CVA  History of Present Illness:  Pt is a 84 year old female with significant past medical history of HTN, lymphedema, prediabetes,, breast cancer hx on tamoxifen , morbid obesity, Obstructive Sleep Apnea, COPD and anxiety who presents to [facility], ED for fall and right sided weakness by EMS from [facility] facility.  Last known well was approximately 1800, patient had a witnessed fall and did hit her head.  Per rapid response note code stroke was called at 1847 for left-sided gaze, dysarthria, right-sided weakness.  CT of head was negative hemorrhage, negative for acute intracranial abnormality, CT of cervical spine-negative for acute changes, CT angio head and neck noted an acute occlusion of the proximal M1 segment of the left MCA, and no significant stenosis in cervical carotid/vertebral arteries.  Patient was ultimately given TNK at 1934, and interventional radiology was activated for code stroke in which a left M1 thrombectomy was performed. Patient remained intubated after procedure.  PCCM consulted to assist with critical care and vent management while residing in ICU.   Pertinent  Medical History   Past Medical History:  Diagnosis Date   Anemia    Anxiety    Arthritis    several different parts of the body per pt 10/17/2022   Back pain    Breast cancer (HCC)    02/20/21   Cancer (HCC)    uterine   Chest pain    Chicken pox    Chronic kidney disease    Constipation    Emphysema of lung (HCC)    Family history of breast cancer 02/14/2021   Glaucoma    Hypertension    Joint pain    Lower extremity edema    Lymphedema    Osteoarthritis    Personal history of malignant neoplasm of uterus 02/14/2021   Pre-diabetes    Prediabetes    Shortness of breath    Sleep apnea    doesn't use CPAP machine anymore   Urine  incontinence    Uterine cancer (HCC)    2011   UTI (urinary tract infection)    Vertigo    had vertigo last week. 10/17/2022   Vitamin B 12 deficiency    during pregnancy     Significant Hospital Events: Including procedures, antibiotic start and stop dates in addition to other pertinent events   1/23-code stroke,  acute occlusion of the proximal M1 segment of the left MCA> IR s/p left M1 thrombectomy 1/24 extubated 1/25 BP goal reduced to normal BP and now tapering down on Levophed  1/26: still on levo  Interim History / Subjective:  NAEON; on 1LNC this morning, complaining of pain in her right hand. On 1mcg levophed . Up to overnight at max.  Objective    Blood pressure (!) 110/49, pulse 77, temperature 97.9 F (36.6 C), temperature source Oral, resp. rate 13, weight 111 kg, SpO2 99%.        Intake/Output Summary (Last 24 hours) at 09/06/2024 0748 Last data filed at 09/06/2024 0700 Gross per 24 hour  Intake 2662.71 ml  Output 1440 ml  Net 1222.71 ml   Filed Weights   09/03/24 1918  Weight: 111 kg    Examination: General: older female, chronically ill appearing, no distress  Lungs: diminished bases, 1LNC, resp even and unlabored  Heart: s1s2, no murmur, rub, gallop  Abdomen: rounded, soft  Neuro: awake, alert,  oriented, moves all extremities well  Extremities r hand/wrist in casting, bruising to BUE; lower extremities no pitting edema   Hgb 9.4 (11) K 3.5  1/25 Echo w/ bubble: LVEF 60-65%, G1DD, negative bubble study  Bilateral LE DVT Study: pending  Net +3.3L on admit, 1.2L/24hr Resolved problem list   Assessment and Plan  Acute CVA of M1 segment of left MCA s/p TNK and left M1 thrombectomy LKW- 1800- 1/23, TNK given at 1934  CT of head/C-spine-negative, CT head/neck angio-acute occlusion of the proximal M1 segment of the left MCA> IR s/p left M1 thrombectomy MRI showed acute left basal ganglia infarct, no other changes  Echo 1/25 with no IAS  - stroke  primary, appreciate management  - pending LE DVT studies  - con't asa 81mg  daily, plavix  75mg  daily  - statin - SBP goal related, aim for normotension, <180 - will need loop recorder at discharged  - SLP, PT, OT - seems puny - needs to get at least OOB to dangle or in chair if able   Hypotension  Initially on levophed  to maintain SBP 120-140 per neurology. BP goals have normalized to <180, however still requiring levo 1mcg during the day and up to 6mcg at night. TSH/T4 was checked and normal. Not on steroids at home or here concerning for adrenal insufficiency. She has been on maintenance fluids 75/hr. At normal, hypertensive with home chlorthalidone /lasix . She is receiving intermittent fentanyl , but low dose and not often.  Not tachycardic or increasing O2 requirements, echo without RV strain, I do not think PE for hypotension No fever, leukocytosis concerning for occult infection.  - 500cc LR bolus this morning  - levo for MAP > 65  - would be in favor of starting midodrine  5 TID if LR bolus is of no use  - needs to mobilize, may benefit from TED hose if orthostasis an issue   Acute respiratory insufficiency/postop-intubation in setting of above-extubated on 1/24 History of Obstructive sleep apnea- noncompliance with CPAP COPD- 01/01/2024 noting ratio 82% FEV1 62% FVC 75% DLCO 41% - on trelegy  Follows up with [facility], no smoking hx  - O2 for sat >92%  - con't yupelri , brovana , pulmicort  nebs > can transition these to Trelegy when close to discharge  - CPAP for OSA  - mobilize, IS   Hypertension Hypotensive requiring levo  - holding home lasix  and chlorthalidone    Prediabetes On metformin   Morbid Obesity- on tirzepatide  weekly injections  P: - Hold metformin   - CBG q4 - SSI  - CBG goal 140-180   Right fifth metacarpal fracture: - add oxy PO to get off IV fentanyl  for pain control, tylenol  ordered  - Discussed with orthopedics on-call on 1/25.  Will need  outpatient Ortho follow-up. - In the meanwhile splint has been ordered per Ortho recommendation.  Right Breast Cancer Hx (2022) Estrogen Receptive Positive  Hold tamoxifen  currently.  Will need to discuss this with stroke team as it increases risk for strokes.  Lymphedema P: Currently stable, continue to monitor Compression stockings  Anxiety P: Supportive care  Continue with ICU level of care for now till the patient is on Levophed .  Will transfer out of ICU once off Levophed .  Labs   CBC: Recent Labs  Lab 09/04/24 0601 09/04/24 1215 09/05/24 0452 09/06/24 0326  WBC  --  14.1* 11.6* 9.7  NEUTROABS  --  11.1*  --   --   HGB 10.5* 10.6* 11.0* 9.4*  HCT 31.0* 31.6*  34.1* 29.6*  MCV  --  83.2 85.9 87.3  PLT  --  335 298 261    Basic Metabolic Panel: Recent Labs  Lab 09/03/24 2256 09/04/24 0601 09/04/24 1215 09/04/24 2137 09/05/24 0452 09/06/24 0326  NA 135 137 138 142 138 138  K 3.7 2.6* 2.7* 4.2 3.9 3.5  CL 95*  --  100 104 103 103  CO2 23  --  26 29 26 27   GLUCOSE 118*  --  140* 138* 118* 111*  BUN 27*  --  18 15 13 11   CREATININE 0.90  --  0.73 0.77 0.73 0.78  CALCIUM  9.1  --  9.2 9.1 8.7* 8.3*  MG  --   --  1.8  --   --   --    GFR: Estimated Creatinine Clearance: 66 mL/min (by C-G formula based on SCr of 0.78 mg/dL). Recent Labs  Lab 09/04/24 1215 09/05/24 0452 09/06/24 0326  WBC 14.1* 11.6* 9.7    Liver Function Tests: Recent Labs  Lab 09/03/24 2256 09/05/24 0452 09/06/24 0326  AST 39 37 27  ALT 25 30 35  ALKPHOS 71 46 42  BILITOT 0.4 0.3 0.3  PROT 6.6 5.8* 5.6*  ALBUMIN  3.3* 2.9* 2.9*   No results for input(s): LIPASE, AMYLASE in the last 168 hours. No results for input(s): AMMONIA in the last 168 hours.  ABG    Component Value Date/Time   PHART 7.408 09/04/2024 0601   PCO2ART 44.5 09/04/2024 0601   PO2ART 172 (H) 09/04/2024 0601   HCO3 28.3 (H) 09/04/2024 0601   TCO2 30 09/04/2024 0601   O2SAT 100 09/04/2024 0601      Coagulation Profile: Recent Labs  Lab 09/03/24 2256  INR 1.1    Cardiac Enzymes: No results for input(s): CKTOTAL, CKMB, CKMBINDEX, TROPONINI in the last 168 hours.  HbA1C: Hemoglobin A1C  Date/Time Value Ref Range Status  08/23/2022 12:00 AM 5.8  Final   Hgb A1c MFr Bld  Date/Time Value Ref Range Status  09/04/2024 12:15 PM 5.8 (H) 4.8 - 5.6 % Final    Comment:    (NOTE) Diagnosis of Diabetes The following HbA1c ranges recommended by the American Diabetes Association (ADA) may be used as an aid in the diagnosis of diabetes mellitus.  Hemoglobin             Suggested A1C NGSP%              Diagnosis  <5.7                   Non Diabetic  5.7-6.4                Pre-Diabetic  >6.4                   Diabetic  <7.0                   Glycemic control for                       adults with diabetes.    06/17/2023 11:05 AM 6.1 (H) 4.8 - 5.6 % Final    Comment:             Prediabetes: 5.7 - 6.4          Diabetes: >6.4          Glycemic control for adults with diabetes: <7.0     CBG: Recent Labs  Lab 09/05/24 1149  09/05/24 1548 09/05/24 1957 09/05/24 2326 09/06/24 0323  GLUCAP 129* 98 89 116* 100*    Review of Systems:   As above  Past Medical History:  She,  has a past medical history of Anemia, Anxiety, Arthritis, Back pain, Breast cancer (HCC), Cancer (HCC), Chest pain, Chicken pox, Chronic kidney disease, Constipation, Emphysema of lung (HCC), Family history of breast cancer (02/14/2021), Glaucoma, Hypertension, Joint pain, Lower extremity edema, Lymphedema, Osteoarthritis, Personal history of malignant neoplasm of uterus (02/14/2021), Pre-diabetes, Prediabetes, Shortness of breath, Sleep apnea, Urine incontinence, Uterine cancer (HCC), UTI (urinary tract infection), Vertigo, and Vitamin B 12 deficiency.   Surgical History:   Past Surgical History:  Procedure Laterality Date   ABDOMINAL HYSTERECTOMY  2011   BOTOX  INJECTION N/A 10/22/2022    Procedure: BOTOX  INJECTION 100 UNITS;  Surgeon: Elisabeth Valli BIRCH, MD;  Location: [facility];  Service: Urology;  Laterality: N/A;   BREAST LUMPECTOMY WITH RADIOACTIVE SEED LOCALIZATION Right 02/20/2021   Procedure: RIGHT BREAST LUMPECTOMY WITH RADIOACTIVE SEED LOCALIZATION;  Surgeon: Aron Shoulders, MD;  Location: [facility] OR;  Service: General;  Laterality: Right;   CYSTOSCOPY WITH INJECTION N/A 10/22/2022   Procedure: CYSTOSCOPY WITH INJECTION OF LUEVENIA;  Surgeon: Elisabeth Valli BIRCH, MD;  Location[facility];  Service: Urology;  Laterality: N/A;   IR ANGIOGRAM EXTREMITY RIGHT  03/18/2023   IR ANGIOGRAM SELECTIVE EACH ADDITIONAL VESSEL  03/18/2023   IR ANGIOGRAM SELECTIVE EACH ADDITIONAL VESSEL  03/18/2023   IR CT HEAD LTD  09/03/2024   IR EMBO ARTERIAL NOT HEMORR HEMANG INC GUIDE ROADMAPPING  03/18/2023   IR PERCUTANEOUS ART THROMBECTOMY/INFUSION INTRACRANIAL INC DIAG ANGIO  09/03/2024   IR RADIOLOGIST EVAL & MGMT  02/05/2023   IR RADIOLOGIST EVAL & MGMT  04/18/2023   IR RADIOLOGIST EVAL & MGMT  12/29/2023   IR US  GUIDE VASC ACCESS RIGHT  03/18/2023   IR US  GUIDE VASC ACCESS RIGHT  09/03/2024   REPLACEMENT TOTAL KNEE Right 2012     Social History:   reports that she quit smoking about 42 years ago. Her smoking use included cigarettes. She has never been exposed to tobacco smoke. She has never used smokeless tobacco. She reports current alcohol use of about 1.0 standard drink of alcohol per week. She reports that she does not use drugs.   Family History:  Her family history includes Alcohol abuse in her brother, brother, father, and paternal grandfather; Arthritis in her brother, maternal grandmother, and paternal grandfather; Asthma in her father; Breast cancer (age of onset: 71) in her paternal aunt; COPD in her father; Cancer in her brother, father, and mother; Cirrhosis in her paternal grandfather; Colon cancer in her maternal grandfather; Colon polyps in her daughter; Depression  in her daughter and son; Diabetes in her maternal grandmother and mother; Drug abuse in her brother; Heart attack in her father and mother; Heart disease in her father and mother; Hyperlipidemia in her maternal grandmother and mother; Hypertension in her daughter and mother; Melanoma in her father and mother; Mental illness in her daughter; Miscarriages / Stillbirths in her mother; Obesity in her father and mother; Stroke in her mother; Thyroid  disease in her mother.   Allergies Allergies[1]   Home Medications  Prior to Admission medications  Medication Sig Start Date End Date Taking? Authorizing Provider  acetaminophen  (TYLENOL ) 650 MG CR tablet Take 650 mg by mouth every 8 (eight) hours as needed for pain. Takes usually once a day    [provider]  alendronate  (  FOSAMAX ) 70 MG tablet TAKE 1 TABLET (70 MG TOTAL) BY MOUTH EVERY 7 (SEVEN) DAYS. TAKE WITH A FULL GLASS OF WATER ON AN EMPTY STOMACH. 04/04/22   Berneta Elsie Sayre, MD  chlorthalidone  (HYGROTON ) 25 MG tablet Take 1 tablet (25 mg total) by mouth daily. 03/15/22   Berneta Elsie Sayre, MD  diclofenac Sodium (VOLTAREN) 1 % GEL Apply 2 g topically daily as needed (Osteoarthritis).    [provider]  fluticasone  (FLONASE ) 50 MCG/ACT nasal spray Place 2 sprays into both nostrils in the morning and at bedtime. 04/17/23   Verdon Parry D, MD  furosemide  (LASIX ) 20 MG tablet Take 1 tablet (20 mg total) by mouth daily. 05/06/24   Emelia Josefa HERO, NP  Melatonin 10 MG TABS Take 10 mg by mouth at bedtime as needed (sleep).    [provider]  metFORMIN  (GLUCOPHAGE ) 500 MG tablet Take 1 tablet (500 mg total) by mouth 2 (two) times daily with a meal. 07/21/24   Verdon Parry D, MD  metroNIDAZOLE  (METROGEL ) 1 % gel Apply topically daily. Patient taking differently: Apply 1 application  topically daily as needed (rosacea). 01/29/21   Berneta Elsie Sayre, MD  Multiple Vitamin (MULTIVITAMIN) tablet Take 1 tablet by mouth  daily.    [provider]  tamoxifen  (NOLVADEX ) 20 MG tablet Take 1 tablet (20 mg total) by mouth daily. 08/13/24   Iruku, Praveena, MD  tirzepatide  5 MG/0.5ML injection vial Inject 5 mg into the skin once a week. 07/21/24   Verdon Parry D, MD  tiZANidine  (ZANAFLEX ) 2 MG tablet TAKE 1-2 TABLETS (2-4 MG TOTAL) BY MOUTH EVERY 8 (EIGHT) HOURS AS NEEDED. 06/26/23   Joane Artist RAMAN, MD  traZODone  (DESYREL ) 50 MG tablet Take 50 mg by mouth at bedtime as needed. 11/03/23   [provider]  DOMINIC BECK 100-62.5-25 MCG/ACT AEPB  11/10/23   [provider]   CC Time: 76  The patient is critically ill with multiple organ system failure and requires high complexity decision making for assessment and support, frequent evaluation and titration of therapies, advanced monitoring, review of radiographic studies and interpretation of complex data.    Critical Care Time devoted to patient care services, exclusive of separately billable procedures, described in this note is 32   Tinnie FORBES Adolph DEVONNA [facility] 09/06/24 7:52 AM  Please see [email or web address] for pager details.  From 7A-7P if no response, please call [phone] After hours, please call ELink [phone]         [1]  Allergies Allergen Reactions   Amoxicillin Nausea And Vomiting   Percocet [Oxycodone -Acetaminophen ] Other (See Comments)    hallucinations   "

## 2024-09-06 NOTE — TOC CAGE-AID Note (Signed)
 Transition of Care [facility]) - CAGE-AID Screening   Patient Details  Name: Kristi Hunter MRN: [id number] Date of Birth: 09-04-1940  Transition of Care [facility] CM/SW Contact:    Inocente GORMAN Kindle, LCSW Phone Number: 09/06/2024, 1:17 PM   Clinical Narrative: Patient reports only the occasional drink of ETOH. Resources not indicated at this time.    CAGE-AID Screening:    Have You Ever Felt You Ought to Cut Down on Your Drinking or Drug Use?: No Have People Annoyed You By Critizing Your Drinking Or Drug Use?: No   Have You Ever Had a Drink or Used Drugs First Thing In The Morning to Steady Your Nerves or to Get Rid of a Hangover?: No    Substance Abuse Education Offered: No (not indicated)

## 2024-09-06 NOTE — Progress Notes (Signed)
 Off oxygen , levo off. Has been in and out of bed today with PT. Neurology transferring out of ICU. Will sign off when she is moved.   Tinnie FORBES Furth, PA-C [facility] 09/06/24 3:52 PM  Please see [email or web address] for pager details.  From 7A-7P if no response, please call [phone] After hours, please call ELink [phone]

## 2024-09-06 NOTE — Evaluation (Signed)
 Occupational Therapy Evaluation Patient Details Name: Kristi Hunter MRN: [id number] DOB: 04/02/1941 Today's Date: 09/06/2024   History of Present Illness   84 y.o. female presents to [facility] hospital on 09/03/2024 after a fall with R weakness and dysarthria. CTA with L MCA occlusion. Pt received TNK and underwent thrombectomy on 1/23. MRI revealed acute infarcts involving the left basal ganglia and punctate  focus of acute infarct in the left parietal lobe. Imaging showed acute mildly displaced distal R fifth metacarpal shaft fracture.  PMH includes OSA, COPD, HTN, anxiety/depression.     Clinical Impressions PTA pt lived in ILF and mobilized around facility using her rollator. Pt with functional decline, requiring Max A with ADL tasks and use of Stedy to mobilize to recliner due to below listed deficits. Patient will benefit from continued inpatient follow up therapy, <3 hours/day to maximize functional level of independence. Acute OT to follow.  VSS; SpO2 >92 on RA     If plan is discharge home, recommend the following:   A lot of help with walking and/or transfers;A lot of help with bathing/dressing/bathroom     Functional Status Assessment         Equipment Recommendations   None recommended by OT     Recommendations for Other Services         Precautions/Restrictions   Precautions Precautions: Fall;Other (comment) Precaution/Restrictions Comments: watch BP (hypotensive) Restrictions RUE Weight Bearing Per Provider Order: Weight bear through elbow only     Mobility Bed Mobility Overal bed mobility: Needs Assistance Bed Mobility: Supine to Sit     Supine to sit: Min assist          Transfers Overall transfer level: Needs assistance                 General transfer comment: min A +2 with stedy Transfer via Lift Equipment: Stedy    Balance Overall balance assessment: Needs assistance Sitting-balance support: Single extremity supported, Feet  supported Sitting balance-Leahy Scale: Fair     Standing balance support: Single extremity supported Standing balance-Leahy Scale: Poor                             ADL either performed or assessed with clinical judgement   ADL Overall ADL's : Needs assistance/impaired Eating/Feeding: Set up   Grooming: Minimal assistance;Sitting   Upper Body Bathing: Minimal assistance;Sitting   Lower Body Bathing: Maximal assistance;Sit to/from stand   Upper Body Dressing : Minimal assistance;Sitting   Lower Body Dressing: Maximal assistance;Sit to/from stand   Toilet Transfer: Maximal assistance;+2 for physical assistance Toilet Transfer Details (indicate cue type and reason): lift equipment Toileting- Clothing Manipulation and Hygiene: Total assistance       Functional mobility during ADLs: Maximal assistance;+2 for physical assistance       Vision Patient Visual Report:  (pt reports some blurring os vision however can read signs around room; will further assess) Additional Comments: will further assess     Perception Perception:  (appears intact)       Praxis Praxis: [facility]       Pertinent Vitals/Pain Pain Assessment Pain Assessment: Faces Faces Pain Scale: Hurts little more Pain Location: R hand; knees Pain Descriptors / Indicators: Discomfort, Grimacing, Guarding Pain Intervention(s): Limited activity within patient's tolerance     Extremity/Trunk Assessment Upper Extremity Assessment Upper Extremity Assessment: Right hand dominant;RUE deficits/detail RUE Deficits / Details: R hand splint with ace wrap; generalized weaknee; trying to use  Lower Extremity Assessment Lower Extremity Assessment: Defer to PT evaluation   Cervical / Trunk Assessment Cervical / Trunk Assessment: Other exceptions Cervical / Trunk Exceptions: increased body habitus   Communication     Cognition Arousal: Alert Behavior During Therapy: WFL for tasks  assessed/performed Cognition: Cognition impaired     Awareness: Online awareness impaired Memory impairment (select all impairments): Working memory Attention impairment (select first level of impairment): Selective attention Executive functioning impairment (select all impairments): Reasoning                   Following commands: Impaired Following commands impaired: Follows multi-step commands with increased time     Cueing  General Comments   Cueing Techniques: Verbal cues;Visual cues  gown wet; pt had pulled off coban, tegaderna dn most likely IV from R arm   Exercises Exercises: Other exercises, General Lower Extremity General Exercises - Lower Extremity Heel Slides: Both, 10 reps, Supine   Shoulder Instructions      Home Living Family/patient expects to be discharged to:: Skilled nursing facility                                        Prior Functioning/Environment Prior Level of Function : Independent/Modified Independent             Mobility Comments: Uses rollator around [place], holds onto furniture for short distances in her apartment, uses [facility] when she goes to her car or walks into stores until she reaches the power scooter to use at stores; x2 falls in past 6 months ADLs Comments: involved at facility in digfferent groups    OT Problem List:     OT Treatment/Interventions:        OT Goals(Current goals can be found in the care plan section)   Acute Rehab OT Goals Patient Stated Goal: back to assistive living OT Goal Formulation: With patient Time For Goal Achievement: 09/20/24 Potential to Achieve Goals: Good   OT Frequency:  Min 2X/week    Co-evaluation              AM-PAC OT 6 Clicks Daily Activity     Outcome Measure Help from another person eating meals?: A Little Help from another person taking care of personal grooming?: A Little Help from another person toileting, which includes using toliet, bedpan,  or urinal?: A Lot Help from another person bathing (including washing, rinsing, drying)?: A Lot Help from another person to put on and taking off regular upper body clothing?: A Little Help from another person to put on and taking off regular lower body clothing?: A Lot 6 Click Score: 15   End of Session Equipment Utilized During Treatment: Gait belt Nurse Communication: Mobility status;Need for lift equipment  Activity Tolerance: Patient tolerated treatment well Patient left: in chair;with call bell/phone within reach  OT Visit Diagnosis: Unsteadiness on feet (R26.81);Other abnormalities of gait and mobility (R26.89);Muscle weakness (generalized) (M62.81);Other symptoms and signs involving the nervous system (R29.898);Other symptoms and signs involving cognitive function                Time: 8957-8879 OT Time Calculation (min): 38 min Charges:  OT General Charges $OT Visit: 1 Visit OT Evaluation $OT Eval Moderate Complexity: 1 Mod OT Treatments $Self Care/Home Management : 23-37 mins  Kreg Sink, OT/L   Acute OT Clinical Specialist Acute Rehabilitation Services Pager [phone] Office [phone]   [facility]  09/06/2024, 11:32 AM

## 2024-09-07 DIAGNOSIS — I63412 Cerebral infarction due to embolism of left middle cerebral artery: Secondary | ICD-10-CM | POA: Diagnosis not present

## 2024-09-07 DIAGNOSIS — I890 Lymphedema, not elsewhere classified: Secondary | ICD-10-CM

## 2024-09-07 LAB — COMPREHENSIVE METABOLIC PANEL WITH GFR
ALT: 30 U/L (ref 0–44)
AST: 22 U/L (ref 15–41)
Albumin: 2.8 g/dL — ABNORMAL LOW (ref 3.5–5.0)
Alkaline Phosphatase: 40 U/L (ref 38–126)
Anion gap: 7 (ref 5–15)
BUN: 12 mg/dL (ref 8–23)
CO2: 27 mmol/L (ref 22–32)
Calcium: 7.8 mg/dL — ABNORMAL LOW (ref 8.9–10.3)
Chloride: 105 mmol/L (ref 98–111)
Creatinine, Ser: 0.76 mg/dL (ref 0.44–1.00)
GFR, Estimated: >60 mL/min
Glucose, Bld: 88 mg/dL (ref 70–99)
Potassium: 3.8 mmol/L (ref 3.5–5.1)
Sodium: 139 mmol/L (ref 135–145)
Total Bilirubin: 0.3 mg/dL (ref 0.0–1.2)
Total Protein: 5.2 g/dL — ABNORMAL LOW (ref 6.5–8.1)

## 2024-09-07 LAB — GLUCOSE, CAPILLARY
Glucose-Capillary: 91 mg/dL (ref 70–99)
Glucose-Capillary: 133 mg/dL — ABNORMAL HIGH (ref 70–99)

## 2024-09-07 LAB — CBC
HCT: 27.7 % — ABNORMAL LOW (ref 36.0–46.0)
Hemoglobin: 8.9 g/dL — ABNORMAL LOW (ref 12.0–15.0)
MCH: 27.9 pg (ref 26.0–34.0)
MCHC: 32.1 g/dL (ref 30.0–36.0)
MCV: 86.8 fL (ref 80.0–100.0)
Platelets: 258 10*3/uL (ref 150–400)
RBC: 3.19 MIL/uL — ABNORMAL LOW (ref 3.87–5.11)
RDW: 14.6 % (ref 11.5–15.5)
WBC: 8.5 10*3/uL (ref 4.0–10.5)
nRBC: 0 % (ref 0.0–0.2)

## 2024-09-07 MED ORDER — ATORVASTATIN CALCIUM 40 MG PO TABS: 40.0000 mg | ORAL_TABLET | Freq: Every day | ORAL | 0 refills | Status: AC

## 2024-09-07 MED ORDER — ASPIRIN 81 MG PO CHEW: 81.0000 mg | CHEWABLE_TABLET | Freq: Every day | ORAL | 0 refills | Status: AC

## 2024-09-07 MED ORDER — CLOPIDOGREL BISULFATE 75 MG PO TABS: 75.0000 mg | ORAL_TABLET | Freq: Every day | ORAL | 0 refills | Status: AC

## 2024-09-07 NOTE — Care Management Important Message (Signed)
 Important Message  Patient Details  Name: Kristi Hunter MRN: [id number] Date of Birth: 12-Mar-1941   Important Message Given:  Yes - Medicare IM     Claretta Deed 09/07/2024, 10:40 AM

## 2024-09-07 NOTE — Discharge Summary (Addendum)
 Stroke Discharge Summary  Patient ID: Kristi Hunter   MRN: [id number]      DOB: 1941/01/08  Date of Admission: 09/03/2024 Date of Discharge: 09/07/2024  Attending Physician:  Stroke, Md, MD, Stroke MD Consultant(s):    Dr. Ray Patient's PCP:  Melchor Ozell PARAS, MD (Inactive)  DISCHARGE DIAGNOSIS: Acute Ischemic Infarct:  left basal ganglia and parietal lobe s/p TNK and mechanical thrombectomy of left M1 occlusion with mechanical thrombectomy resulting in TICI 2B revascularization Etiology: Likely embolic from cryptogenic source Principal Problem:   Acute ischemic left MCA stroke [facility]) Active Problems:   Mixed hyperlipidemia   Glaucoma of both eyes   Essential hypertension   Morbid obesity (HCC)   Musculoskeletal back pain   OSA (obstructive sleep apnea)   Acute respiratory insufficiency, postoperative   Lymphedema   Allergies as of 09/07/2024       Reactions   Amoxicillin Nausea And Vomiting   Percocet [oxycodone -acetaminophen ] Other (See Comments)   hallucinations        Medication List     STOP taking these medications    acetaminophen  650 MG CR tablet Commonly known as: TYLENOL    chlorthalidone  25 MG tablet Commonly known as: HYGROTON    furosemide  20 MG tablet Commonly known as: LASIX    Melatonin 10 MG Tabs   tamoxifen  20 MG tablet Commonly known as: NOLVADEX    tiZANidine  2 MG tablet Commonly known as: ZANAFLEX    traZODone  50 MG tablet Commonly known as: DESYREL        TAKE these medications    alendronate  70 MG tablet Commonly known as: FOSAMAX  TAKE 1 TABLET (70 MG TOTAL) BY MOUTH EVERY 7 (SEVEN) DAYS. TAKE WITH A FULL GLASS OF WATER ON AN EMPTY STOMACH.   aspirin  81 MG chewable tablet Chew 1 tablet (81 mg total) by mouth daily. Start taking on: September 08, 2024   atorvastatin  40 MG tablet Commonly known as: LIPITOR Take 1 tablet (40 mg total) by mouth daily. Start taking on: September 08, 2024   clopidogrel  75 MG tablet Commonly  known as: PLAVIX  Take 1 tablet (75 mg total) by mouth daily. Start taking on: September 08, 2024   diclofenac Sodium 1 % Gel Commonly known as: VOLTAREN Apply 2 g topically daily as needed (Osteoarthritis).   fluticasone  50 MCG/ACT nasal spray Commonly known as: FLONASE  Place 2 sprays into both nostrils in the morning and at bedtime. What changed:  when to take this reasons to take this   metFORMIN  500 MG tablet Commonly known as: GLUCOPHAGE  Take 1 tablet (500 mg total) by mouth 2 (two) times daily with a meal.   metroNIDAZOLE  1 % gel Commonly known as: Metrogel  Apply topically daily. What changed:  how much to take when to take this reasons to take this   Multivitamin Gummies Adult Chew Chew 2 tablets by mouth daily.   Trelegy Ellipta 100-62.5-25 MCG/ACT Aepb Generic drug: Fluticasone -Umeclidin-Vilant Inhale 1 puff into the lungs daily.   Zepbound  5 MG/0.5ML Pen Generic drug: tirzepatide  Inject 5 mg into the skin once a week.        LABORATORY STUDIES CBC    Component Value Date/Time   WBC 8.5 09/07/2024 0526   RBC 3.19 (L) 09/07/2024 0526   HGB 8.9 (L) 09/07/2024 0526   HGB 12.6 02/20/2022 0942   HCT 27.7 (L) 09/07/2024 0526   HCT 39.6 02/20/2022 0942   PLT 258 09/07/2024 0526   PLT 318 02/20/2022 0942   MCV 86.8 09/07/2024 0526  MCV 87 02/20/2022 0942   MCH 27.9 09/07/2024 0526   MCHC 32.1 09/07/2024 0526   RDW 14.6 09/07/2024 0526   RDW 13.0 02/20/2022 0942   LYMPHSABS 2.1 09/04/2024 1215   LYMPHSABS 2.3 02/20/2022 0942   MONOABS 0.8 09/04/2024 1215   EOSABS 0.1 09/04/2024 1215   EOSABS 0.2 02/20/2022 0942   BASOSABS 0.1 09/04/2024 1215   BASOSABS 0.1 02/20/2022 0942   CMP    Component Value Date/Time   NA 139 09/07/2024 0526   NA 144 06/17/2023 1105   K 3.8 09/07/2024 0526   CL 105 09/07/2024 0526   CO2 27 09/07/2024 0526   GLUCOSE 88 09/07/2024 0526   BUN 12 09/07/2024 0526   BUN 18 06/17/2023 1105   CREATININE 0.76 09/07/2024 0526    CREATININE 0.76 12/11/2021 1226   CALCIUM  7.8 (L) 09/07/2024 0526   PROT 5.2 (L) 09/07/2024 0526   PROT 6.4 06/17/2023 1105   ALBUMIN  2.8 (L) 09/07/2024 0526   ALBUMIN  3.6 (L) 06/17/2023 1105   AST 22 09/07/2024 0526   AST 19 12/11/2021 1226   ALT 30 09/07/2024 0526   ALT 17 12/11/2021 1226   ALKPHOS 40 09/07/2024 0526   BILITOT 0.3 09/07/2024 0526   BILITOT <0.2 06/17/2023 1105   BILITOT 0.3 12/11/2021 1226   GFRNONAA >60 09/07/2024 0526   GFRNONAA >60 12/11/2021 1226   GFRAA 104 02/22/2020 1339   COAGS Lab Results  Component Value Date   INR 1.1 09/03/2024   INR 1.0 03/18/2023   INR 1.0 03/15/2022   Lipid Panel    Component Value Date/Time   CHOL 140 09/04/2024 1216   CHOL 159 02/20/2022 0942   TRIG 90 09/04/2024 1216   HDL 49 09/04/2024 1216   HDL 49 02/20/2022 0942   CHOLHDL 2.9 09/04/2024 1216   VLDL 18 09/04/2024 1216   LDLCALC 73 09/04/2024 1216   LDLCALC 98 02/20/2022 0942   HgbA1C  Lab Results  Component Value Date   HGBA1C 5.8 (H) 09/04/2024   Urinalysis    Component Value Date/Time   COLORURINE YELLOW 10/13/2019 1613   APPEARANCEUR CLEAR 10/13/2019 1613   LABSPEC 1.020 10/13/2019 1613   PHURINE 7.0 10/13/2019 1613   GLUCOSEU NEGATIVE 10/13/2019 1613   HGBUR NEGATIVE 10/13/2019 1613   BILIRUBINUR NEGATIVE 10/13/2019 1613   KETONESUR NEGATIVE 10/13/2019 1613   UROBILINOGEN 0.2 10/13/2019 1613   NITRITE NEGATIVE 10/13/2019 1613   LEUKOCYTESUR NEGATIVE 10/13/2019 1613   Urine Drug Screen No results found for: LABOPIA, COCAINSCRNUR, LABBENZ, AMPHETMU, THCU, LABBARB  Alcohol Level    Component Value Date/Time   [facility] <15 09/03/2024 2256     SIGNIFICANT DIAGNOSTIC STUDIES VAS US  LOWER EXTREMITY VENOUS (DVT) Result Date: 09/06/2024  Lower Venous DVT Study Patient Name:  Kristi Hunter  Date of Exam:   09/06/2024 Medical Rec #: [id number]         Accession #:    [id number] Date of Birth: August 07, 1941        Patient Gender: F Patient  Age:   84 years Exam Location:  [facility] Procedure:      VAS US  LOWER EXTREMITY VENOUS (DVT) Referring Phys: KARNA WOLFE --------------------------------------------------------------------------------  Indications: Stroke.  Limitations: Body habitus and Pain with compression maneuvers. Comparison Study: Prior negative right LEV done 04/01/22 with reflux study Performing Technologist: Alberta Lis RVS  Examination Guidelines: A complete evaluation includes B-mode imaging, spectral Doppler, color Doppler, and power Doppler as needed of all accessible portions of each vessel. Bilateral testing is  considered an integral part of a complete examination. Limited examinations for reoccurring indications may be performed as noted. The reflux portion of the exam is performed with the patient in reverse Trendelenburg.  +---------+---------------+---------+-----------+----------+-------------------+ RIGHT    CompressibilityPhasicitySpontaneityPropertiesThrombus Aging      +---------+---------------+---------+-----------+----------+-------------------+ CFV                     Yes      No                   patent by color and                                                       Doppler             +---------+---------------+---------+-----------+----------+-------------------+ FV Prox                 Yes      No                   patent by color and                                                       Doppler             +---------+---------------+---------+-----------+----------+-------------------+ FV Mid   Full                                                             +---------+---------------+---------+-----------+----------+-------------------+ FV DistalFull           Yes      No                                       +---------+---------------+---------+-----------+----------+-------------------+ PFV                                                    Not well visualized +---------+---------------+---------+-----------+----------+-------------------+ POP                     Yes      No                   patent by color and                                                       Doppler             +---------+---------------+---------+-----------+----------+-------------------+ PTV      Full                                                             +---------+---------------+---------+-----------+----------+-------------------+  PERO                                                  Not well visualized +---------+---------------+---------+-----------+----------+-------------------+   +---------+---------------+---------+-----------+----------+-------------------+ LEFT     CompressibilityPhasicitySpontaneityPropertiesThrombus Aging      +---------+---------------+---------+-----------+----------+-------------------+ CFV                     Yes      No                   patent by color and                                                       Doppler             +---------+---------------+---------+-----------+----------+-------------------+ FV Prox                 Yes      No                   patent by color and                                                       Doppler             +---------+---------------+---------+-----------+----------+-------------------+ FV Mid                  Yes      No                   patent by color and                                                       Doppler             +---------+---------------+---------+-----------+----------+-------------------+ FV Distal               Yes      No                   patent by color and                                                       Doppler             +---------+---------------+---------+-----------+----------+-------------------+ PFV                                                    Not well visualized +---------+---------------+---------+-----------+----------+-------------------+ POP  Yes      No                   patent by color and                                                       Doppler             +---------+---------------+---------+-----------+----------+-------------------+ PTV      Full                                                             +---------+---------------+---------+-----------+----------+-------------------+ PERO                                                  Continued           +---------+---------------+---------+-----------+----------+-------------------+     Summary: RIGHT: - There is no evidence of deep vein thrombosis in the lower extremity.  LEFT: - There is no evidence of deep vein thrombosis in the lower extremity.  *See table(s) above for measurements and observations. Electronically signed by Fonda Rim on 09/06/2024 at 2:38:22 PM.    Final    ECHOCARDIOGRAM LIMITED BUBBLE STUDY Result Date: 09/05/2024    ECHOCARDIOGRAM LIMITED REPORT   Patient Name:   Kristi Hunter Date of Exam: 09/05/2024 Medical Rec #:  [id number]        Height:       63.0 in Accession #:    [id number]       Weight:       244.7 lb Date of Birth:  10-02-1940       BSA:          2.107 m Patient Age:    83 years         BP:           99/65 mmHg Patient Gender: F                HR:           91 bpm. Exam Location:  Inpatient Procedure: Limited Echo, Cardiac Doppler, Color Doppler and Saline Contrast            Bubble Study (Both Spectral and Color Flow Doppler were utilized            during procedure). Indications:    Stroke  History:        Patient has prior history of Echocardiogram examinations, most                 recent 04/03/2023. Signs/Symptoms:Edema; Risk                 Factors:Hypertension, Dyslipidemia, Sleep Apnea and Former                 Smoker.  Sonographer:    Merlynn Argyle Referring Phys: KARNA DELENA GERALDS   Sonographer Comments: Suboptimal apical window. Image acquisition challenging due to patient body habitus and Image acquisition challenging due to respiratory motion. IMPRESSIONS  1. Left ventricular ejection fraction, by estimation, is 60 to 65%. The left ventricle has normal function. The left ventricle has no regional wall motion abnormalities. Left ventricular diastolic parameters are consistent with Grade I diastolic dysfunction (impaired relaxation).  2. Right ventricular systolic function is normal. The right ventricular size is normal. Tricuspid regurgitation signal is inadequate for assessing PA pressure.  3. The mitral valve is normal in structure. No evidence of mitral valve regurgitation. No evidence of mitral stenosis.  4. The aortic valve is tricuspid. Aortic valve regurgitation is not visualized. No aortic stenosis is present.  5. The inferior vena cava is normal in size with greater than 50% respiratory variability, suggesting right atrial pressure of 3 mmHg.  6. Agitated saline contrast bubble study was negative, with no evidence of any interatrial shunt. FINDINGS  Left Ventricle: Left ventricular ejection fraction, by estimation, is 60 to 65%. The left ventricle has normal function. The left ventricle has no regional wall motion abnormalities. The left ventricular internal cavity size was normal in size. There is  no left ventricular hypertrophy. Left ventricular diastolic parameters are consistent with Grade I diastolic dysfunction (impaired relaxation). Right Ventricle: The right ventricular size is normal. No increase in right ventricular wall thickness. Right ventricular systolic function is normal. Tricuspid regurgitation signal is inadequate for assessing PA pressure. Left Atrium: Left atrial size was normal in size. Right Atrium: Right atrial size was normal in size. Pericardium: There is no evidence of pericardial effusion. Mitral Valve: The mitral valve is normal in structure. No evidence  of mitral valve stenosis. Tricuspid Valve: The tricuspid valve is normal in structure. Tricuspid valve regurgitation is not demonstrated. No evidence of tricuspid stenosis. Aortic Valve: The aortic valve is tricuspid. Aortic valve regurgitation is not visualized. No aortic stenosis is present. Pulmonic Valve: The pulmonic valve was normal in structure. Pulmonic valve regurgitation is not visualized. No evidence of pulmonic stenosis. Aorta: The aortic root is normal in size and structure. Venous: The inferior vena cava is normal in size with greater than 50% respiratory variability, suggesting right atrial pressure of 3 mmHg. IAS/Shunts: No atrial level shunt detected by color flow Doppler. Agitated saline contrast was given intravenously to evaluate for intracardiac shunting. Agitated saline contrast bubble study was negative, with no evidence of any interatrial shunt. LEFT VENTRICLE PLAX 2D LVIDd:         4.20 cm Diastology LVIDs:         2.90 cm LV e' medial:    10.30 cm/s LV PW:         0.90 cm LV E/e' medial:  8.2 LV IVS:        0.80 cm LV e' lateral:   12.80 cm/s                        LV E/e' lateral: 6.6  RIGHT VENTRICLE             IVC RV S prime:     16.10 cm/s  IVC diam: 1.40 cm TAPSE (M-mode): 1.9 cm LEFT ATRIUM         Index LA diam:    3.40 cm 1.61 cm/m  MITRAL VALVE MV Area (PHT): 3.24 cm MV Decel Time: 234 msec MV E velocity: 84.35 cm/s MV A velocity: 109.00 cm/s MV E/A ratio:  0.77 Mihai Croitoru MD Electronically signed by Jerel Balding MD Signature Date/Time: 09/05/2024/1:50:40 PM    Final    CT HEAD WO CONTRAST Result Date:  09/04/2024 EXAM: CT HEAD WITHOUT CONTRAST 09/03/2024 TECHNIQUE: CT of the head was performed without the administration of intravenous contrast. Automated exposure control, iterative reconstruction, and/or weight based adjustment of the mA/kV was utilized to reduce the radiation dose to as low as reasonably achievable. COMPARISON: None available. CLINICAL HISTORY: Stroke,  follow up. FINDINGS: BRAIN AND VENTRICLES: No acute hemorrhage. No evidence of acute infarct. No hydrocephalus. No extra-axial collection. No mass effect or midline shift. ORBITS: Bilateral lens replacement. SINUSES: No acute abnormality. SOFT TISSUES AND SKULL: No acute soft tissue abnormality. No skull fracture. IMPRESSION: 1. No acute intracranial abnormality. Electronically signed by: Morgane Naveau MD 09/04/2024 08:37 PM EST RP Workstation: [id number]   DG Abd Portable 1V Result Date: 09/04/2024 EXAM: 1 VIEW XRAY OF THE ABDOMEN 09/04/2024 03:01:00 PM COMPARISON: None available. CLINICAL HISTORY: Encounter for imaging study to confirm orogastric tube placement. FINDINGS: LINES, TUBES AND DEVICES: An enteric tube is present with the tip projecting over the left upper quadrant, consistent with location in the body of the stomach. BOWEL: Nonobstructive bowel gas pattern. SOFT TISSUES: Limited field of view for evaluation of ureteral calculi. No radiopaque stones. BONES: No acute fracture. Degenerative changes in the spine. IMPRESSION: 1. Enteric tube tip projects over the left upper quadrant, consistent with location in the body of the stomach. Electronically signed by: Elsie Gravely MD 09/04/2024 03:15 PM EST RP Workstation: [id number]   MR BRAIN WO CONTRAST Result Date: 09/04/2024 EXAM: MRI BRAIN WITHOUT CONTRAST 09/04/2024 11:43:53 AM TECHNIQUE: Multiplanar multisequence MRI of the head/brain was performed without the administration of intravenous contrast. COMPARISON: CT head and CTA head and neck 07/14/2025. CLINICAL HISTORY: Stroke, follow up. FINDINGS: BRAIN AND VENTRICLES: Acute infarct involving the posterior aspect of the left lentiform nucleus partially extending into the posterior limb of the internal capsule. Additional region of acute infarct in the body and tail of the left caudate nucleus. Smaller areas of acute infarct involving the left globus pallidus and anterior aspect of the lentiform  nucleus. There is a punctate focus of acute infarct in the left parietal lobe. No significant edema or mass effect. There is no midline shift. No hydrocephalus. T2 and FLAIR hyperintensity in the periventricular and subcortical white matter compatible with mild chronic microvascular ischemic changes. Skull base flow voids are visualized including the main flow voids of the left MCA. ORBITS: Bilateral lens replacement. SINUSES AND MASTOIDS: Mild mucosal thickening in the ethmoid sinuses. Fluid in the nasopharynx. BONES AND SOFT TISSUES: Normal marrow signal. Partially visualized endotracheal tube. IMPRESSION: 1. Acute infarcts involving the left basal ganglia as described above. Punctate focus of acute infarct in the left parietal lobe. 2. No significant edema, mass effect, or midline shift. Electronically signed by: Donnice Mania MD 09/04/2024 12:46 PM EST RP Workstation: [id number]   ECHOCARDIOGRAM COMPLETE Result Date: 09/04/2024    ECHOCARDIOGRAM REPORT   Patient Name:   Kristi Hunter Date of Exam: 09/04/2024 Medical Rec #:  [id number]        Height:       63.0 in Accession #:    [id number]       Weight:       244.7 lb Date of Birth:  September 21, 1940       BSA:          2.107 m Patient Age:    83 years         BP:           141/70 mmHg Patient Gender: F  HR:           64 bpm. Exam Location:  Inpatient Procedure: 2D Echo (Both Spectral and Color Flow Doppler were utilized during            procedure). Indications:    stroke  History:        Patient has prior history of Echocardiogram examinations.                 Signs/Symptoms:Edema; Risk Factors:Sleep Apnea, Hypertension and                 Dyslipidemia.  Sonographer:    Tinnie Barefoot RDCS Referring Phys: [phone] MCNEILL P KIRKPATRICK  Sonographer Comments: Echo performed with patient supine and on artificial respirator. IMPRESSIONS  1. Left ventricular ejection fraction, by estimation, is 60 to 65%. Left ventricular ejection fraction by 2D MOD  biplane is 69.0 %. The left ventricle has normal function. The left ventricle has no regional wall motion abnormalities.  2. Right ventricular systolic function is normal. The right ventricular size is normal.  3. The mitral valve is normal in structure. No evidence of mitral valve regurgitation. No evidence of mitral stenosis. Moderate mitral annular calcification.  4. The aortic valve is normal in structure. Aortic valve regurgitation is not visualized. No aortic stenosis is present.  5. The inferior vena cava is normal in size with <50% respiratory variability, suggesting right atrial pressure of 8 mmHg. Comparison(s): No significant change from prior study. Prior images reviewed side by side. FINDINGS  Left Ventricle: Left ventricular ejection fraction, by estimation, is 60 to 65%. Left ventricular ejection fraction by 2D MOD biplane is 69.0 %. The left ventricle has normal function. The left ventricle has no regional wall motion abnormalities. The left ventricular internal cavity size was normal in size. There is no left ventricular hypertrophy. Left ventricular diastolic function could not be evaluated due to mitral annular calcification (moderate or greater). Right Ventricle: The right ventricular size is normal. No increase in right ventricular wall thickness. Right ventricular systolic function is normal. Left Atrium: Left atrial size was normal in size. Right Atrium: Right atrial size was normal in size. Pericardium: There is no evidence of pericardial effusion. Mitral Valve: The mitral valve is normal in structure. Moderate mitral annular calcification. No evidence of mitral valve regurgitation. No evidence of mitral valve stenosis. Tricuspid Valve: The tricuspid valve is normal in structure. Tricuspid valve regurgitation is not demonstrated. No evidence of tricuspid stenosis. Aortic Valve: The aortic valve is normal in structure. Aortic valve regurgitation is not visualized. No aortic stenosis is  present. Pulmonic Valve: The pulmonic valve was normal in structure. Pulmonic valve regurgitation is not visualized. No evidence of pulmonic stenosis. Aorta: The aortic root is normal in size and structure. Venous: The inferior vena cava is normal in size with less than 50% respiratory variability, suggesting right atrial pressure of 8 mmHg. IAS/Shunts: No atrial level shunt detected by color flow Doppler.  LEFT VENTRICLE PLAX 2D                        Biplane EF (MOD) LVIDd:         4.10 cm         LV Biplane EF:   Left LVIDs:         2.10 cm                          ventricular LV  PW:         1.10 cm                          ejection LV IVS:        1.20 cm                          fraction by LVOT diam:     1.80 cm                          2D MOD LV SV:         54                               biplane is LV SV Index:   26                               69.0 %. LVOT Area:     2.54 cm                                Diastology                                LV e' medial:    8.27 cm/s LV Volumes (MOD)               LV E/e' medial:  9.7 LV vol d, MOD    44.2 ml       LV e' lateral:   8.59 cm/s A2C:                           LV E/e' lateral: 9.4 LV vol d, MOD    51.8 ml A4C: LV vol s, MOD    10.9 ml A2C: LV vol s, MOD    18.6 ml A4C: LV SV MOD A2C:   33.3 ml LV SV MOD A4C:   51.8 ml LV SV MOD BP:    33.2 ml RIGHT VENTRICLE             IVC RV Basal diam:  2.30 cm     IVC diam: 1.70 cm RV S prime:     11.10 cm/s TAPSE (M-mode): 1.8 cm      PULMONARY VEINS                             Diastolic Velocity: 40.50 cm/s                             S/D Velocity:       1.40                             Systolic Velocity:  55.90 cm/s LEFT ATRIUM             Index        RIGHT ATRIUM          Index LA diam:        3.80 cm 1.80 cm/m  RA Area:     9.76 cm LA Vol (A2C):   38.4 ml 18.23 ml/m  RA Volume:   19.20 ml 9.11 ml/m LA Vol (A4C):   33.9 ml 16.09 ml/m LA Biplane Vol: 36.2 ml 17.18 ml/m  AORTIC VALVE LVOT Vmax:   119.00 cm/s  LVOT Vmean:  73.700 cm/s LVOT VTI:    0.212 m  AORTA Ao Root diam: 2.80 cm Ao Asc diam:  3.10 cm MITRAL VALVE MV Area (PHT): 2.80 cm     SHUNTS MV Decel Time: 271 msec     Systemic VTI:  0.21 m MV E velocity: 80.50 cm/s   Systemic Diam: 1.80 cm MV A velocity: 105.00 cm/s MV E/A ratio:  0.77 Franck Azobou Tonleu Electronically signed by Joelle Ren Ny Signature Date/Time: 09/04/2024/12:28:50 PM    Final    DG Hand 2 View Right Result Date: 09/04/2024 CLINICAL DATA:  Stroke.  Swelling and bruising. EXAM: RIGHT HAND - 2 VIEW COMPARISON:  None Available. FINDINGS: Acute mildly displaced distal fifth metacarpal shaft fracture. No intra-articular involvement. The bones are subjectively under mineralized. Osteoarthritis involving the digits and thumb carpal metacarpal joint. Generalized soft tissue edema, prominent over the dorsum of the hand. No radiopaque foreign body or soft tissue gas. IMPRESSION: 1. Acute mildly displaced distal fifth metacarpal shaft fracture. 2. Generalized soft tissue edema, prominent over the dorsum of the hand. Electronically Signed   By: Andrea Gasman M.D.   On: 09/04/2024 10:58   IR PERCUTANEOUS ART THROMBECTOMY/INFUSION INTRACRANIAL INC DIAG ANGIO Result Date: 09/04/2024 PROCEDURE PERFORMED: 1. Cerebral angiography with stroke thrombectomy 2. Ultrasound guided vascular access 3. Cone beam CT for treatment planning COMPARISON:  CT angiogram of the head and neck performed September 03, 2024 CLINICAL DATA:  84 year old female with acute ischemic stroke with symptoms driven by right-sided weakness. NIH stroke scale was estimated at 16. The patient was evaluated in a multidisciplinary fashion and by consensus was brought to interventional radiology for mechanical thrombectomy. INDICATION: Acute ischemic stroke. ANESTHESIA/SEDATION: General anesthesia was utilized for the procedure. CONTRAST:  Approximately 35 cc Ominipque 300 MEDICATIONS: See MAR FLUOROSCOPY TIME:  Fluoroscopy Time: 5  minutes, (536 mGy). COMPLICATIONS: None immediate. BODY OF REPORT: Following a full explanation of the procedure along with the potential associated complications, an informed witnessed consent was obtained. The patient was then placed under general anesthesia by the Department of Anesthesiology at [facility]. The right femoral access site was prepped and draped in the usual sterile fashion. Ultrasound was used to study the right common femoral artery which was patent. Using real-time ultrasound guidance, a 19 gauge introducer needle was used to access the right common femoral artery. Access was performed at 2017. A hard copy image ultrasound the saved and stored in PACS. Using this access, a 6 French sheath was placed in the descending thoracic aorta. Next, selective catheterization the left internal carotid artery was performed. A selective arteriogram was performed which demonstrated occlusion of the distal left M1 segment. Pretreatment TICI score 0. Next, red 72 catheter was used to selectively catheterize the distal left M1 segment. The first pass was performed at 2032. A repeat arteriogram with catheter positioned in the left internal carotid artery demonstrated successful extraction of the left M1 embolus. A small frontal branch embolus was observed on the frontal and lateral views which was nonocclusive and too small for subsequent retrieval. The main truncal branches of the left MCA territory were patent. The vast majority of the left MCA territory was reperfused.  There was no evidence of active arterial extravasation or dissection. Post treatment TICI score 2b. After reviewing the imaging, I elected to terminate the procedure at this point. Evaluation of the right femoral access site demonstrated that the site was suitable for a closure device. A 6 French Angio-Seal device was deployed without complication. Cone beam CT was then performed to evaluate for intracranial hemorrhage and treatment  planning. This demonstrated no evidence of significant acute intracranial hemorrhage. The patient remained intubated secondary to anesthesia concerns. The patient was transferred to the ICU without evidence of immediate complication. IMPRESSION: 1. Suction thrombectomy of left M1 occlusion. PLAN: 1. To ICU for routine postoperative supportive care. Electronically Signed   By: Maude Naegeli M.D.   On: 09/04/2024 08:46   IR CT Head Ltd Result Date: 09/04/2024 PROCEDURE PERFORMED: 1. Cerebral angiography with stroke thrombectomy 2. Ultrasound guided vascular access 3. Cone beam CT for treatment planning COMPARISON:  CT angiogram of the head and neck performed September 03, 2024 CLINICAL DATA:  84 year old female with acute ischemic stroke with symptoms driven by right-sided weakness. NIH stroke scale was estimated at 16. The patient was evaluated in a multidisciplinary fashion and by consensus was brought to interventional radiology for mechanical thrombectomy. INDICATION: Acute ischemic stroke. ANESTHESIA/SEDATION: General anesthesia was utilized for the procedure. CONTRAST:  Approximately 35 cc Ominipque 300 MEDICATIONS: See MAR FLUOROSCOPY TIME:  Fluoroscopy Time: 5 minutes, (536 mGy). COMPLICATIONS: None immediate. BODY OF REPORT: Following a full explanation of the procedure along with the potential associated complications, an informed witnessed consent was obtained. The patient was then placed under general anesthesia by the Department of Anesthesiology at [facility]. The right femoral access site was prepped and draped in the usual sterile fashion. Ultrasound was used to study the right common femoral artery which was patent. Using real-time ultrasound guidance, a 19 gauge introducer needle was used to access the right common femoral artery. Access was performed at 2017. A hard copy image ultrasound the saved and stored in PACS. Using this access, a 6 French sheath was placed in the descending thoracic  aorta. Next, selective catheterization the left internal carotid artery was performed. A selective arteriogram was performed which demonstrated occlusion of the distal left M1 segment. Pretreatment TICI score 0. Next, red 72 catheter was used to selectively catheterize the distal left M1 segment. The first pass was performed at 2032. A repeat arteriogram with catheter positioned in the left internal carotid artery demonstrated successful extraction of the left M1 embolus. A small frontal branch embolus was observed on the frontal and lateral views which was nonocclusive and too small for subsequent retrieval. The main truncal branches of the left MCA territory were patent. The vast majority of the left MCA territory was reperfused. There was no evidence of active arterial extravasation or dissection. Post treatment TICI score 2b. After reviewing the imaging, I elected to terminate the procedure at this point. Evaluation of the right femoral access site demonstrated that the site was suitable for a closure device. A 6 French Angio-Seal device was deployed without complication. Cone beam CT was then performed to evaluate for intracranial hemorrhage and treatment planning. This demonstrated no evidence of significant acute intracranial hemorrhage. The patient remained intubated secondary to anesthesia concerns. The patient was transferred to the ICU without evidence of immediate complication. IMPRESSION: 1. Suction thrombectomy of left M1 occlusion. PLAN: 1. To ICU for routine postoperative supportive care. Electronically Signed   By: Maude Naegeli M.D.   On: 09/04/2024  08:46   IR US  Guide Vasc Access Right Result Date: 09/04/2024 PROCEDURE PERFORMED: 1. Cerebral angiography with stroke thrombectomy 2. Ultrasound guided vascular access 3. Cone beam CT for treatment planning COMPARISON:  CT angiogram of the head and neck performed September 03, 2024 CLINICAL DATA:  84 year old female with acute ischemic stroke with  symptoms driven by right-sided weakness. NIH stroke scale was estimated at 16. The patient was evaluated in a multidisciplinary fashion and by consensus was brought to interventional radiology for mechanical thrombectomy. INDICATION: Acute ischemic stroke. ANESTHESIA/SEDATION: General anesthesia was utilized for the procedure. CONTRAST:  Approximately 35 cc Ominipque 300 MEDICATIONS: See MAR FLUOROSCOPY TIME:  Fluoroscopy Time: 5 minutes, (536 mGy). COMPLICATIONS: None immediate. BODY OF REPORT: Following a full explanation of the procedure along with the potential associated complications, an informed witnessed consent was obtained. The patient was then placed under general anesthesia by the Department of Anesthesiology at [facility]. The right femoral access site was prepped and draped in the usual sterile fashion. Ultrasound was used to study the right common femoral artery which was patent. Using real-time ultrasound guidance, a 19 gauge introducer needle was used to access the right common femoral artery. Access was performed at 2017. A hard copy image ultrasound the saved and stored in PACS. Using this access, a 6 French sheath was placed in the descending thoracic aorta. Next, selective catheterization the left internal carotid artery was performed. A selective arteriogram was performed which demonstrated occlusion of the distal left M1 segment. Pretreatment TICI score 0. Next, red 72 catheter was used to selectively catheterize the distal left M1 segment. The first pass was performed at 2032. A repeat arteriogram with catheter positioned in the left internal carotid artery demonstrated successful extraction of the left M1 embolus. A small frontal branch embolus was observed on the frontal and lateral views which was nonocclusive and too small for subsequent retrieval. The main truncal branches of the left MCA territory were patent. The vast majority of the left MCA territory was reperfused. There  was no evidence of active arterial extravasation or dissection. Post treatment TICI score 2b. After reviewing the imaging, I elected to terminate the procedure at this point. Evaluation of the right femoral access site demonstrated that the site was suitable for a closure device. A 6 French Angio-Seal device was deployed without complication. Cone beam CT was then performed to evaluate for intracranial hemorrhage and treatment planning. This demonstrated no evidence of significant acute intracranial hemorrhage. The patient remained intubated secondary to anesthesia concerns. The patient was transferred to the ICU without evidence of immediate complication. IMPRESSION: 1. Suction thrombectomy of left M1 occlusion. PLAN: 1. To ICU for routine postoperative supportive care. Electronically Signed   By: Maude Naegeli M.D.   On: 09/04/2024 08:46   CT ANGIO HEAD NECK W WO CM (CODE STROKE) Result Date: 09/03/2024 EXAM: CTA HEAD AND NECK WITH AND WITHOUT 09/03/2024 07:50:46 PM TECHNIQUE: CTA of the head and neck was performed with and without the administration of 75 mL iohexol  (OMNIPAQUE ) 350 MG/ML injection. Multiplanar 2D and/or 3D reformatted images are provided for review. Automated exposure control, iterative reconstruction, and/or weight based adjustment of the mA/kV was utilized to reduce the radiation dose to as low as reasonably achievable. Stenosis of the internal carotid arteries measured using NASCET criteria. COMPARISON: Same day CT head CLINICAL HISTORY: Neuro deficit, acute, stroke suspected. FINDINGS: CTA NECK: AORTIC ARCH AND ARCH VESSELS: Mild to moderate atherosclerosis of the visualized aortic arch. Atherosclerosis at the  left subclavian artery origin without stenosis. No dissection or arterial injury. CERVICAL CAROTID ARTERIES: Atherosclerosis at the right carotid bifurcation extending into the right carotid bulb without hemodynamically significant stenosis. Mild atherosclerosis at the left carotid  bifurcation without hemodynamically significant stenosis. Mild tortuosity of the distal left cervical ICA. No dissection or arterial injury. CERVICAL VERTEBRAL ARTERIES: Motion artifact limits evaluation of the V1 sequence of the vertebral arteries. Within these limitations, the vertebral arteries are patent to the vertebrobasilar confluence. No dissection, arterial injury, or significant stenosis. LUNGS AND MEDIASTINUM: Unremarkable. SOFT TISSUES: No acute abnormality. BONES: There are mild degenerative changes in the visualized spine. No acute abnormality. CTA HEAD: ANTERIOR CIRCULATION: Atherosclerosis of the carotid siphons without significant stenosis. There is abrupt cutoff of the proximal M1 segment of the left MCA compatible with acute occlusion. There is contrast filling of a few distal left MCA branches likely by mild collaterals. The right MCA is patent. Bilateral ACAs are patent. No aneurysm. POSTERIOR CIRCULATION: Fetal origin of the left PCA. No significant stenosis of the posterior cerebral arteries. No significant stenosis of the basilar artery. No significant stenosis of the vertebral arteries. No aneurysm. OTHER: No dural venous sinus thrombosis on this non-dedicated study. IMPRESSION: 1. Acute occlusion of the proximal M1 segment of the left MCA. There is contrast filling of a few distal left MCA branches, likely via mild collaterals. 2. No hemodynamically significant stenosis in the cervical carotid or vertebral arteries. 3. Impression #1 messaged to Dr. Michaela via the [facility] messaging system at 7:53 PM on 09/03/24. Electronically signed by: Donnice Mania MD 09/03/2024 08:04 PM EST RP Workstation: [id number]   CT C-SPINE NO CHARGE Result Date: 09/03/2024 EXAM: CT CERVICAL SPINE WITHOUT CONTRAST 09/03/2024 07:50:46 PM TECHNIQUE: CT of the cervical spine was performed without the administration of intravenous contrast. Multiplanar reformatted images are provided for review. Automated exposure  control, iterative reconstruction, and/or weight based adjustment of the mA/kV was utilized to reduce the radiation dose to as low as reasonably achievable. COMPARISON: None available. CLINICAL HISTORY: (Patient age 89y, gender F) FINDINGS: BONES AND ALIGNMENT: No acute fracture or traumatic malalignment. DEGENERATIVE CHANGES: Multilevel mild-to-moderate degenerative changes of the spine. No associated severe osseous or foraminal or central canal stenosis. SOFT TISSUES: No prevertebral soft tissue swelling. VASCULATURE: Atherosclerotic plaque of the aortic arch and its main branches. IMPRESSION: 1. No acute findings. Electronically signed by: Morgane Naveau MD 09/03/2024 07:58 PM EST RP Workstation: [id number]   CT HEAD CODE STROKE WO CONTRAST Result Date: 09/03/2024 EXAM: CT HEAD WITHOUT CONTRAST 09/03/2024 07:25:43 PM TECHNIQUE: CT of the head was performed without the administration of intravenous contrast. Automated exposure control, iterative reconstruction, and/or weight based adjustment of the mA/kV was utilized to reduce the radiation dose to as low as reasonably achievable. COMPARISON: None available. CLINICAL HISTORY: Acute neurological deficit, stroke suspected. FINDINGS: BRAIN AND VENTRICLES: No acute hemorrhage. No evidence of acute infarct. No hydrocephalus. No extra-axial collection. No mass effect or midline shift. Streak artifact limiting evaluation of posterior fossa. Mild chronic microvascular ischemic changes. Atherosclerosis of the carotid siphons. [place] stroke program early CT (ASPECT) score: Ganglionic (caudate, IC, lentiform nucleus, insula, M1-M3): 7 Supraganglionic (M4-M6): 3 Total: 10 ORBITS: Bilateral lens replacement. SINUSES: Mucosal thickening within left sphenoid sinus. SOFT TISSUES AND SKULL: Hyperostosis frontalis interna. No acute soft tissue abnormality. No skull fracture. IMPRESSION: 1. No acute intracranial abnormality. 2. ASPECTS score 10. 3. Mild chronic microvascular  ischemic changes. 4. Findings messaged to Dr. Michaela via the [facility] messaging system at  7:33 PM on 09/03/24. Electronically signed by: Donnice Mania MD 09/03/2024 07:34 PM EST RP Workstation: [id number]      HISTORY OF PRESENT ILLNESS 84 y.o. female with history of hypertension, prediabetes, history of breast cancer and uterine cancer, anxiety, emphysema, glaucoma, OSA on CPAP, lymphedema, vitamin B12 deficiency, CKD who presents with right-sided weakness that started abruptly at 6 PM. 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  NIH on Admission 16. CTA revealed left M1 occlusion, taken to IR for thrombectomy with TICI 2B revascularization.  Patient did well with good recovery and residual mild right hemiparesis only.    HOSPITAL COURSE Acute Ischemic Infarct:  left basal ganglia and parietal lobe s/p TNK and mechanical thrombectomy of left M1 with TICI 2B revascularization Etiology: Likely embolic cryptogenic source Code Stroke CT head No acute abnormality. Small vessel disease. ASPECTS 10.    CTA head & neck  Acute occlusion of the proximal M1 segment of the left MCA. There is contrast filling of a few distal left MCA branches, likely via mild collaterals. MRI acute infarcts in left basal ganglia and punctate infarct in left parietal lobe 09/04/24 2D Echo EF 60 to 65%.  Left atrial size normal 09/05/24 Limited echo with bubble: EF 60-65% Negative bubble study.  Lower extremity Doppler Negative  Will be sent 30 day heart monitor to look for paroxysmal A-fib LDL 84 HgbA1c 6.1 VTE prophylaxis -SCDs, ok to start DVT prophylaxis No antithrombotic prior to admission, continue aspirin  81 mg daily and clopidogrel  75 mg daily for 3 weeks then aspirin  alone Therapy recommendations: SNF  Disposition: SNF 1/27  Hypertension Home meds: [place]gro[place]  25 mg, Lasix  20 mg Stable BP goal: < 180 Avoid  hypotension   Hyperlipidemia Home meds: None LDL 84, goal < 70 Continue atorvastatin  40 mg Continue statin at discharge   Acute respiratory failure status post mechanical thrombectomy COPD OSA on CPAP Extubated 1/24-currently on nasal cannula Bronchodilators ordered Continue CPAP on discharge   Diabetes type II Controlled Home meds: Metformin , Zepbound  HgbA1c 6.1, goal < 7.0 CBGs SSI Recommend close follow-up with PCP    Other Stroke Risk Factors Obesity, Body mass index is 43.35 kg/m., BMI >/= 30 associated with increased stroke risk, recommend weight loss, diet and exercise as appropriate  Obstructive sleep apnea, on CPAP at home   Other Active Problems History of breast cancer and uterine cancer.  On tamoxifen  Recommend followup with oncologist regarding risks/benefit review Lymphedema Anxiety CKD B12 deficiency  DISCHARGE EXAM Blood pressure (!) 138/56, pulse 88, temperature 98.3 F (36.8 C), temperature source Oral, resp. rate 19, weight 111 kg, SpO2 98%.  PHYSICAL EXAM General: Obese elderly female, NAD, sitting up in bed.  Psych:  Mood and affect appropriate for situation CV: Regular rate and rhythm  Respiratory:  Regular, unlabored respirations on room air GI: Abdomen soft and nontender   NEURO:  Mental Status: She is awake alert oriented to time place and person.  No aphasia.  Slight dysarthria.  Follows commands well.  Extraocular movements are full range without nystagmus.  Blink to threat bilaterally.  Tongue midline.   Motor system exam no upper or lower extremity drift.  Mild right hip flexor weakness.  Right hand fracture now with cast, decreased grip Sensation intact bilaterally.  Gait not tested.  Discharge Diet       Diet   Diet regular Room service appropriate? Yes with  Assist; Fluid consistency: Thin   liquids  DISCHARGE PLAN Disposition:  SNF aspirin  81 mg daily and clopidogrel  75 mg daily for secondary stroke prevention for 3 weeks  then aspirin  alone. Ongoing stroke risk factor control by Primary Care Physician at time of discharge Follow-up PCP Piazza, Michael J, MD (Inactive) in 2 weeks. Follow-up in [facility] in 8 weeks, call office to schedule an appointment.   35 minutes were spent preparing discharge.   Pt seen by Neuro NP/APP with MD. Note/plan to be edited by MD as needed.    Rocky JAYSON Likes, DNP, AGACNP-BC [facility] Please use AMION for contact information & EPIC for messaging.   I have personally obtained history,examined this patient, reviewed notes, independently viewed imaging studies, participated in medical decision making and plan of care.ROS completed by me personally and pertinent positives fully documented  I have made any additions or clarifications directly to the above note. Agree with note above.  Recommend 30-day outpatient cardiac monitoring for paroxysmal A-fib.  Follow-up as an outpatient stroke clinic in 2 months  Eather Popp, MD Medical Director [facility] Pager: [phone] 09/07/2024 2:54 PM

## 2024-09-07 NOTE — Plan of Care (Signed)
 " Problem: Education: Goal: Knowledge of disease or condition will improve Outcome: Progressing Goal: Knowledge of secondary prevention will improve (MUST DOCUMENT ALL) Outcome: Progressing Goal: Knowledge of patient specific risk factors will improve (DELETE if not current risk factor) Outcome: Progressing   Problem: Ischemic Stroke/TIA Tissue Perfusion: Goal: Complications of ischemic stroke/TIA will be minimized Outcome: Progressing   Problem: Coping: Goal: Will verbalize positive feelings about self Outcome: Progressing Goal: Will identify appropriate support needs Outcome: Progressing   Problem: Health Behavior/Discharge Planning: Goal: Ability to manage health-related needs will improve Outcome: Progressing Goal: Goals will be collaboratively established with patient/family Outcome: Progressing   Problem: Self-Care: Goal: Ability to participate in self-care as condition permits will improve Outcome: Progressing Goal: Verbalization of feelings and concerns over difficulty with self-care will improve Outcome: Progressing Goal: Ability to communicate needs accurately will improve Outcome: Progressing   Problem: Nutrition: Goal: Risk of aspiration will decrease Outcome: Progressing Goal: Dietary intake will improve Outcome: Progressing   Problem: Education: Goal: Ability to describe self-care measures that may prevent or decrease complications (Diabetes Survival Skills Education) will improve Outcome: Progressing Goal: Individualized Educational Video(s) Outcome: Progressing   Problem: Coping: Goal: Ability to adjust to condition or change in health will improve Outcome: Progressing   Problem: Fluid Volume: Goal: Ability to maintain a balanced intake and output will improve Outcome: Progressing   Problem: Health Behavior/Discharge Planning: Goal: Ability to identify and utilize available resources and services will improve Outcome: Progressing Goal: Ability to  manage health-related needs will improve Outcome: Progressing   Problem: Metabolic: Goal: Ability to maintain appropriate glucose levels will improve Outcome: Progressing   Problem: Nutritional: Goal: Maintenance of adequate nutrition will improve Outcome: Progressing Goal: Progress toward achieving an optimal weight will improve Outcome: Progressing   Problem: Skin Integrity: Goal: Risk for impaired skin integrity will decrease Outcome: Progressing   Problem: Tissue Perfusion: Goal: Adequacy of tissue perfusion will improve Outcome: Progressing   Problem: Education: Goal: Knowledge of General Education information will improve Description: Including pain rating scale, medication(s)/side effects and non-pharmacologic comfort measures Outcome: Progressing   Problem: Health Behavior/Discharge Planning: Goal: Ability to manage health-related needs will improve Outcome: Progressing   Problem: Clinical Measurements: Goal: Ability to maintain clinical measurements within normal limits will improve Outcome: Progressing Goal: Will remain free from infection Outcome: Progressing Goal: Diagnostic test results will improve Outcome: Progressing Goal: Respiratory complications will improve Outcome: Progressing Goal: Cardiovascular complication will be avoided Outcome: Progressing   Problem: Activity: Goal: Risk for activity intolerance will decrease Outcome: Progressing   Problem: Nutrition: Goal: Adequate nutrition will be maintained Outcome: Progressing   Problem: Coping: Goal: Level of anxiety will decrease Outcome: Progressing   Problem: Elimination: Goal: Will not experience complications related to bowel motility Outcome: Progressing Goal: Will not experience complications related to urinary retention Outcome: Progressing   Problem: Pain Managment: Goal: General experience of comfort will improve and/or be controlled Outcome: Progressing   Problem:  Safety: Goal: Ability to remain free from injury will improve Outcome: Progressing   Problem: Skin Integrity: Goal: Risk for impaired skin integrity will decrease Outcome: Progressing   Problem: Education: Goal: Understanding of CV disease, CV risk reduction, and recovery process will improve Outcome: Progressing Goal: Individualized Educational Video(s) Outcome: Progressing   Problem: Activity: Goal: Ability to return to baseline activity level will improve Outcome: Progressing   Problem: Cardiovascular: Goal: Ability to achieve and maintain adequate cardiovascular perfusion will improve Outcome: Progressing Goal: Vascular access site(s) Level 0-1 will be maintained Outcome:  Progressing   Problem: Health Behavior/Discharge Planning: Goal: Ability to safely manage health-related needs after discharge will improve Outcome: Progressing   Problem: Safety: Goal: Non-violent Restraint(s) Outcome: Progressing   "

## 2024-09-07 NOTE — TOC Transition Note (Signed)
 Transition of Care [facility]) - Discharge Note   Patient Details  Name: Kristi Hunter MRN: [id number] Date of Birth: 06/22/1941  [facility]) CM/SW Contact:  Almarie CHRISTELLA Goodie, LCSW Phone Number: 09/07/2024, 12:05 PM   Clinical Narrative:   CSW notified by MD that patient is stable for discharge. CSW confirmed with [name] that they can admit today. Discharge information sent to [name], confirmed receipt. Transport arranged with [facility] for next available.  Nurse to call report to [phone] Room 120    Final next level of care: Skilled Nursing Facility Barriers to Discharge: Barriers Resolved   Patient Goals and CMS Choice   CMS Medicare.gov Compare Post Acute Care list provided to:: Patient Choice offered to / list presented to : Patient [place] ownership interest in [facility].provided to:: Patient    Discharge Placement              Patient chooses bed at: [name] at [facility] Patient to be transferred to facility by: [facility] Name of family member notified: Self Patient and family notified of of transfer: 09/07/24  Discharge Plan and Services Additional resources added to the After Visit Summary for   In-house Referral: Clinical Social Work   Post Acute Care Choice: Skilled Nursing Facility                               Social Drivers of Health (SDOH) Interventions SDOH Screenings   Food Insecurity: No Food Insecurity (09/06/2024)  Housing: Unknown (09/06/2024)  Transportation Needs: No Transportation Needs (09/06/2024)  Utilities: Not At Risk (09/06/2024)  Alcohol Screen: Low Risk (08/17/2021)  Depression (PHQ2-9): Low Risk (03/15/2022)  Financial Resource Strain: Low Risk (10/04/2021)  Physical Activity: Inactive (08/17/2021)  Social Connections: Moderately Integrated (09/06/2024)  Stress: No Stress Concern Present (08/17/2021)  Tobacco Use: Medium Risk (09/03/2024)     Readmission Risk Interventions     No data to display

## 2024-09-07 NOTE — Progress Notes (Signed)
 Nursing Discharge Note  Name: Kristi Hunter MRN: [id number] DOB: 1941/01/06  Admit Date: 09/03/2024 Discharge Date: 09/07/2024  Kristi Hunter to be discharged to a Skilled Nursing Facility per MD order.  AVS completed, placed in discharge packet for facility review. Discharge packet compiled for facility. Non-emergency ambulance transport arranged. Report called to [name] at [phone]. Spoke with Devaughn, LPN at this time.     Discharge Instructions   None      Allergies as of 09/07/2024       Reactions   Amoxicillin Nausea And Vomiting   Percocet [oxycodone -acetaminophen ] Other (See Comments)   hallucinations        Medication List     STOP taking these medications    acetaminophen  650 MG CR tablet Commonly known as: TYLENOL    chlorthalidone  25 MG tablet Commonly known as: HYGROTON    furosemide  20 MG tablet Commonly known as: LASIX    Melatonin 10 MG Tabs   tamoxifen  20 MG tablet Commonly known as: NOLVADEX    tiZANidine  2 MG tablet Commonly known as: ZANAFLEX    traZODone  50 MG tablet Commonly known as: DESYREL        TAKE these medications    alendronate  70 MG tablet Commonly known as: FOSAMAX  TAKE 1 TABLET (70 MG TOTAL) BY MOUTH EVERY 7 (SEVEN) DAYS. TAKE WITH A FULL GLASS OF WATER ON AN EMPTY STOMACH.   aspirin  81 MG chewable tablet Chew 1 tablet (81 mg total) by mouth daily. Start taking on: September 08, 2024   atorvastatin  40 MG tablet Commonly known as: LIPITOR Take 1 tablet (40 mg total) by mouth daily. Start taking on: September 08, 2024   clopidogrel  75 MG tablet Commonly known as: PLAVIX  Take 1 tablet (75 mg total) by mouth daily. Start taking on: September 08, 2024   diclofenac Sodium 1 % Gel Commonly known as: VOLTAREN Apply 2 g topically daily as needed (Osteoarthritis).   fluticasone  50 MCG/ACT nasal spray Commonly known as: FLONASE  Place 2 sprays into both nostrils in the morning and at bedtime. What changed:  when to  take this reasons to take this   metFORMIN  500 MG tablet Commonly known as: GLUCOPHAGE  Take 1 tablet (500 mg total) by mouth 2 (two) times daily with a meal.   metroNIDAZOLE  1 % gel Commonly known as: Metrogel  Apply topically daily. What changed:  how much to take when to take this reasons to take this   Multivitamin Gummies Adult Chew Chew 2 tablets by mouth daily.   Trelegy Ellipta 100-62.5-25 MCG/ACT Aepb Generic drug: Fluticasone -Umeclidin-Vilant Inhale 1 puff into the lungs daily.   Zepbound  5 MG/0.5ML Pen Generic drug: tirzepatide  Inject 5 mg into the skin once a week.         Discharge Instructions     Call MD for:   Complete by: As directed    Please call 911 for any acute stroke symptoms, including: Sudden severe headache, vision changes, facial droop, weakness on one side of your body, trouble speaking, severe dizziness/falls.   Call MD for:  difficulty breathing, headache or visual disturbances   Complete by: As directed    Call MD for:  extreme fatigue   Complete by: As directed    Call MD for:  persistant dizziness or light-headedness   Complete by: As directed    Call MD for:  persistant nausea and vomiting   Complete by: As directed    Call MD for:  severe uncontrolled pain   Complete by: As directed    Discharge instructions  Complete by: As directed    You are being discharged from [facility] after being admitted for an acute stroke.    You have been placed on aspirin  and Plavix , please continue to take both of these medications for total of 21 days, then take aspirin  alone daily.  You also take a statin, Lipitor daily.  You will need to follow-up with your primary care physician within 1 to 2 weeks, and with the neurology clinic within 8 weeks after your discharge.   Increase activity slowly   Complete by: As directed          [facility] to provide transportation to facility for patient. Non-emergency ambulance transport at bedside.  Handoff completed with [facility] staff/EMTs.   Patient discharged from hospital unit via stretcher. Stable at time of discharge.

## 2024-09-08 ENCOUNTER — Encounter: Payer: Self-pay | Admitting: *Deleted

## 2024-09-08 ENCOUNTER — Other Ambulatory Visit: Payer: Self-pay | Admitting: Cardiology

## 2024-09-08 DIAGNOSIS — I63512 Cerebral infarction due to unspecified occlusion or stenosis of left middle cerebral artery: Secondary | ICD-10-CM

## 2024-09-08 NOTE — Progress Notes (Unsigned)
 [employer] event monitor shipped to [facility] in [facility], [address], [place], [place]  [address] Attn: Director of Nursing.  Letter with instructions mailed to same address.

## 2024-09-08 NOTE — Care Management Important Message (Signed)
 Important Message  Patient Details  Name: Kristi Hunter MRN: [id number] Date of Birth: 10-Jan-1941   Important Message Given:  Yes - Medicare IM   IM was given on 08/07/2026   Claretta Deed 09/08/2024, 8:26 AM

## 2024-09-08 NOTE — Progress Notes (Signed)
 Ordering a 30 day monitor, post stroke, per request of neurology. Results to Dr. Jordan

## 2024-10-07 ENCOUNTER — Ambulatory Visit (INDEPENDENT_AMBULATORY_CARE_PROVIDER_SITE_OTHER): Admitting: Family Medicine

## 2024-11-03 ENCOUNTER — Ambulatory Visit: Admitting: Emergency Medicine

## 2025-06-24 ENCOUNTER — Inpatient Hospital Stay: Admitting: Hematology and Oncology
